# Patient Record
Sex: Male | Born: 1950 | Race: Black or African American | Hispanic: No | Marital: Married | Smoking: Former smoker
Health system: Southern US, Community
[De-identification: ages and names within clinical notes are randomized; demographics above are authoritative.]

## PROBLEM LIST (undated history)

## (undated) DIAGNOSIS — I10 Essential (primary) hypertension: Secondary | ICD-10-CM

## (undated) DIAGNOSIS — E119 Type 2 diabetes mellitus without complications: Secondary | ICD-10-CM

## (undated) DIAGNOSIS — E669 Obesity, unspecified: Secondary | ICD-10-CM

## (undated) DIAGNOSIS — N189 Chronic kidney disease, unspecified: Secondary | ICD-10-CM

## (undated) DIAGNOSIS — I2699 Other pulmonary embolism without acute cor pulmonale: Secondary | ICD-10-CM

## (undated) DIAGNOSIS — I428 Other cardiomyopathies: Secondary | ICD-10-CM

## (undated) DIAGNOSIS — M1990 Unspecified osteoarthritis, unspecified site: Secondary | ICD-10-CM

## (undated) DIAGNOSIS — E7800 Pure hypercholesterolemia, unspecified: Secondary | ICD-10-CM

## (undated) DIAGNOSIS — D126 Benign neoplasm of colon, unspecified: Secondary | ICD-10-CM

## (undated) DIAGNOSIS — H269 Unspecified cataract: Secondary | ICD-10-CM

## (undated) DIAGNOSIS — C801 Malignant (primary) neoplasm, unspecified: Secondary | ICD-10-CM

## (undated) DIAGNOSIS — T7840XA Allergy, unspecified, initial encounter: Secondary | ICD-10-CM

## (undated) DIAGNOSIS — I509 Heart failure, unspecified: Secondary | ICD-10-CM

## (undated) DIAGNOSIS — Z9889 Other specified postprocedural states: Secondary | ICD-10-CM

## (undated) DIAGNOSIS — F1020 Alcohol dependence, uncomplicated: Secondary | ICD-10-CM

## (undated) DIAGNOSIS — I119 Hypertensive heart disease without heart failure: Secondary | ICD-10-CM

## (undated) DIAGNOSIS — K219 Gastro-esophageal reflux disease without esophagitis: Secondary | ICD-10-CM

## (undated) DIAGNOSIS — D472 Monoclonal gammopathy: Secondary | ICD-10-CM

## (undated) HISTORY — DX: Unspecified cataract: H26.9

## (undated) HISTORY — DX: Malignant (primary) neoplasm, unspecified: C80.1

## (undated) HISTORY — PX: JOINT REPLACEMENT: SHX530

## (undated) HISTORY — PX: CIRCUMCISION: SUR203

## (undated) HISTORY — DX: Benign neoplasm of colon, unspecified: D12.6

## (undated) HISTORY — PX: EYE SURGERY: SHX253

## (undated) HISTORY — DX: Other specified postprocedural states: Z98.890

## (undated) HISTORY — DX: Essential (primary) hypertension: I10

## (undated) HISTORY — DX: Monoclonal gammopathy: D47.2

## (undated) HISTORY — DX: Pure hypercholesterolemia, unspecified: E78.00

## (undated) HISTORY — DX: Allergy, unspecified, initial encounter: T78.40XA

## (undated) HISTORY — DX: Gastro-esophageal reflux disease without esophagitis: K21.9

## (undated) HISTORY — PX: TUMOR EXCISION: SHX421

## (undated) HISTORY — DX: Unspecified osteoarthritis, unspecified site: M19.90

---
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
## 2004-01-14 ENCOUNTER — Ambulatory Visit (HOSPITAL_COMMUNITY): Admission: RE | Admit: 2004-01-14 | Discharge: 2004-01-14 | Payer: Self-pay | Admitting: Family Medicine

## 2004-05-01 DIAGNOSIS — D126 Benign neoplasm of colon, unspecified: Secondary | ICD-10-CM

## 2004-05-01 HISTORY — DX: Benign neoplasm of colon, unspecified: D12.6

## 2004-05-20 ENCOUNTER — Ambulatory Visit (HOSPITAL_COMMUNITY): Admission: RE | Admit: 2004-05-20 | Discharge: 2004-05-20 | Payer: Self-pay | Admitting: Internal Medicine

## 2004-05-20 ENCOUNTER — Ambulatory Visit: Payer: Self-pay | Admitting: Internal Medicine

## 2004-05-20 HISTORY — PX: COLONOSCOPY: SHX174

## 2007-06-28 ENCOUNTER — Ambulatory Visit: Payer: Self-pay | Admitting: Internal Medicine

## 2007-06-28 ENCOUNTER — Encounter: Payer: Self-pay | Admitting: Internal Medicine

## 2007-06-28 ENCOUNTER — Ambulatory Visit (HOSPITAL_COMMUNITY): Admission: RE | Admit: 2007-06-28 | Discharge: 2007-06-28 | Payer: Self-pay | Admitting: Internal Medicine

## 2007-06-28 HISTORY — PX: OTHER SURGICAL HISTORY: SHX169

## 2008-05-01 HISTORY — PX: TOTAL HIP ARTHROPLASTY: SHX124

## 2009-01-05 ENCOUNTER — Inpatient Hospital Stay (HOSPITAL_COMMUNITY): Admission: RE | Admit: 2009-01-05 | Discharge: 2009-01-08 | Payer: Self-pay | Admitting: Orthopedic Surgery

## 2009-03-30 ENCOUNTER — Inpatient Hospital Stay (HOSPITAL_COMMUNITY): Admission: RE | Admit: 2009-03-30 | Discharge: 2009-04-01 | Payer: Self-pay | Admitting: Orthopedic Surgery

## 2010-02-24 ENCOUNTER — Ambulatory Visit (HOSPITAL_COMMUNITY): Admission: RE | Admit: 2010-02-24 | Discharge: 2010-02-24 | Payer: Self-pay | Admitting: Family Medicine

## 2010-08-02 LAB — BASIC METABOLIC PANEL
CO2: 28 mEq/L (ref 19–32)
Calcium: 8.3 mg/dL — ABNORMAL LOW (ref 8.4–10.5)
Calcium: 8.4 mg/dL (ref 8.4–10.5)
Chloride: 97 mEq/L (ref 96–112)
Creatinine, Ser: 1.07 mg/dL (ref 0.4–1.5)
GFR calc Af Amer: >60 mL/min (ref >60)
GFR calc non Af Amer: >60 mL/min (ref >60)
Glucose, Bld: 135 mg/dL — ABNORMAL HIGH (ref 70–99)
Glucose, Bld: 153 mg/dL — ABNORMAL HIGH (ref 70–99)
Potassium: 3.8 mEq/L (ref 3.5–5.1)
Sodium: 132 mEq/L — ABNORMAL LOW (ref 135–145)
Sodium: 135 mEq/L (ref 135–145)

## 2010-08-02 LAB — CBC
HCT: 32.1 % — ABNORMAL LOW (ref 39.0–52.0)
Hemoglobin: 10.9 g/dL — ABNORMAL LOW (ref 13.0–17.0)
Hemoglobin: 10.9 g/dL — ABNORMAL LOW (ref 13.0–17.0)
MCHC: 34.1 g/dL (ref 30.0–36.0)
MCV: 91.3 fL (ref 78.0–100.0)
RBC: 3.52 MIL/uL — ABNORMAL LOW (ref 4.22–5.81)
RDW: 13.5 % (ref 11.5–15.5)
RDW: 13.9 % (ref 11.5–15.5)

## 2010-08-02 LAB — GLUCOSE, CAPILLARY
Glucose-Capillary: 151 mg/dL — ABNORMAL HIGH (ref 70–99)
Glucose-Capillary: 163 mg/dL — ABNORMAL HIGH (ref 70–99)

## 2010-08-03 LAB — URINALYSIS, ROUTINE W REFLEX MICROSCOPIC
Bilirubin Urine: NEGATIVE
Glucose, UA: NEGATIVE mg/dL
Hgb urine dipstick: NEGATIVE
Specific Gravity, Urine: 1.038 — ABNORMAL HIGH (ref 1.005–1.030)
Urobilinogen, UA: 0.2 mg/dL (ref 0.0–1.0)

## 2010-08-03 LAB — BASIC METABOLIC PANEL
BUN: 17 mg/dL (ref 6–23)
CO2: 26 mEq/L (ref 19–32)
Chloride: 107 mEq/L (ref 96–112)
Creatinine, Ser: 1.07 mg/dL (ref 0.4–1.5)
Potassium: 4.6 mEq/L (ref 3.5–5.1)

## 2010-08-03 LAB — DIFFERENTIAL
Basophils Relative: 1 % (ref 0–1)
Eosinophils Absolute: 0.1 10*3/uL (ref 0.0–0.7)
Eosinophils Relative: 1 % (ref 0–5)
Lymphs Abs: 2.4 10*3/uL (ref 0.7–4.0)
Monocytes Relative: 10 % (ref 3–12)
Neutrophils Relative %: 47 % (ref 43–77)

## 2010-08-03 LAB — CBC
HCT: 41.1 % (ref 39.0–52.0)
MCHC: 33.2 g/dL (ref 30.0–36.0)
MCV: 91.9 fL (ref 78.0–100.0)
Platelets: 316 10*3/uL (ref 150–400)

## 2010-08-03 LAB — TYPE AND SCREEN
ABO/RH(D): O POS
Antibody Screen: NEGATIVE

## 2010-08-03 LAB — GLUCOSE, CAPILLARY
Glucose-Capillary: 141 mg/dL — ABNORMAL HIGH (ref 70–99)
Glucose-Capillary: 143 mg/dL — ABNORMAL HIGH (ref 70–99)

## 2010-08-03 LAB — PROTIME-INR
INR: 0.93 (ref 0.00–1.49)
Prothrombin Time: 12.4 seconds (ref 11.6–15.2)

## 2010-08-05 LAB — URINALYSIS, ROUTINE W REFLEX MICROSCOPIC
Bilirubin Urine: NEGATIVE
Glucose, UA: NEGATIVE mg/dL
Hgb urine dipstick: NEGATIVE
Specific Gravity, Urine: 1.031 — ABNORMAL HIGH (ref 1.005–1.030)
Urobilinogen, UA: 0.2 mg/dL (ref 0.0–1.0)
pH: 5.5 (ref 5.0–8.0)

## 2010-08-05 LAB — CBC
HCT: 32.3 % — ABNORMAL LOW (ref 39.0–52.0)
HCT: 32.3 % — ABNORMAL LOW (ref 39.0–52.0)
HCT: 41 % (ref 39.0–52.0)
Hemoglobin: 10.9 g/dL — ABNORMAL LOW (ref 13.0–17.0)
Hemoglobin: 11 g/dL — ABNORMAL LOW (ref 13.0–17.0)
Hemoglobin: 14 g/dL (ref 13.0–17.0)
MCHC: 34.1 g/dL (ref 30.0–36.0)
MCHC: 34.1 g/dL (ref 30.0–36.0)
Platelets: 252 10*3/uL (ref 150–400)
RBC: 3.4 MIL/uL — ABNORMAL LOW (ref 4.22–5.81)
RDW: 13.7 % (ref 11.5–15.5)
RDW: 14 % (ref 11.5–15.5)
RDW: 14 % (ref 11.5–15.5)
WBC: 9.8 10*3/uL (ref 4.0–10.5)

## 2010-08-05 LAB — GLUCOSE, CAPILLARY
Glucose-Capillary: 101 mg/dL — ABNORMAL HIGH (ref 70–99)
Glucose-Capillary: 119 mg/dL — ABNORMAL HIGH (ref 70–99)
Glucose-Capillary: 119 mg/dL — ABNORMAL HIGH (ref 70–99)
Glucose-Capillary: 139 mg/dL — ABNORMAL HIGH (ref 70–99)
Glucose-Capillary: 139 mg/dL — ABNORMAL HIGH (ref 70–99)
Glucose-Capillary: 144 mg/dL — ABNORMAL HIGH (ref 70–99)
Glucose-Capillary: 147 mg/dL — ABNORMAL HIGH (ref 70–99)
Glucose-Capillary: 168 mg/dL — ABNORMAL HIGH (ref 70–99)
Glucose-Capillary: 96 mg/dL (ref 70–99)

## 2010-08-05 LAB — BASIC METABOLIC PANEL
BUN: 9 mg/dL (ref 6–23)
CO2: 25 mEq/L (ref 19–32)
CO2: 28 mEq/L (ref 19–32)
Calcium: 8.6 mg/dL (ref 8.4–10.5)
Chloride: 106 mEq/L (ref 96–112)
GFR calc Af Amer: >60 mL/min (ref >60)
GFR calc non Af Amer: >60 mL/min (ref >60)
GFR calc non Af Amer: >60 mL/min (ref >60)
Glucose, Bld: 140 mg/dL — ABNORMAL HIGH (ref 70–99)
Glucose, Bld: 144 mg/dL — ABNORMAL HIGH (ref 70–99)
Glucose, Bld: 146 mg/dL — ABNORMAL HIGH (ref 70–99)
Potassium: 3.5 mEq/L (ref 3.5–5.1)
Potassium: 3.9 mEq/L (ref 3.5–5.1)
Potassium: 4.2 mEq/L (ref 3.5–5.1)
Sodium: 135 mEq/L (ref 135–145)
Sodium: 138 mEq/L (ref 135–145)

## 2010-08-05 LAB — DIFFERENTIAL
Basophils Absolute: 0 10*3/uL (ref 0.0–0.1)
Basophils Relative: 1 % (ref 0–1)
Eosinophils Relative: 1 % (ref 0–5)
Monocytes Absolute: 0.5 10*3/uL (ref 0.1–1.0)
Monocytes Relative: 8 % (ref 3–12)

## 2010-08-05 LAB — ABO/RH: ABO/RH(D): O POS

## 2010-08-05 LAB — TYPE AND SCREEN: ABO/RH(D): O POS

## 2010-09-13 NOTE — H&P (Signed)
NAMEYUMA, PACELLA               ACCOUNT NO.:  [id number]   MEDICAL RECORD NO.:  [id number]          PATIENT TYPE:  INP   LOCATION:  NA                           FACILITY:  [facility]   PHYSICIAN:  Madlyn Frankel. Charlann Boxer, M.D.  DATE OF BIRTH:  [birth year]   DATE OF ADMISSION:  01/05/2009  DATE OF DISCHARGE:                              HISTORY & PHYSICAL   PROCEDURE:  Right total hip replacement.   CHIEF COMPLAINT:  Right hip pain.   HISTORY OF PRESENT ILLNESS:  A 60 year old male with a history of right  hip pain secondary to osteoarthritis.  It has been refractory to all  conservative treatment.   PRIMARY CARE PHYSICIAN:  Dr. Regino Schultze.   MEDICAL HISTORY:  1. Osteoarthritis.  2. Hypertension.  3. Diabetes.   PAST SURGICAL HISTORY:  Circumcision, 1978.   FAMILY HISTORY:  Diabetes.   SOCIAL HISTORY:  Married.  Supervisor.  Nonsmoker.   PRIMARY CAREGIVER:  Will be family in the home postoperatively.   DRUG ALLERGIES:  NO KNOWN DRUG ALLERGIES.   MEDICATIONS:  1. Lisinopril.  2. Metformin.  3. Hydrocodone.   Verify dosage and frequency with the patient at time of admission, check  home medication reconciliation sheet.   REVIEW OF SYSTEMS:  None other than HPI.   PHYSICAL EXAMINATION:  Pulse 72.  Respirations 16.  Blood pressure  146/90.  GENERAL:  Awake, alert, and oriented.  HEENT:  Normocephalic.  NECK:  Supple.  No carotid bruits.  CHEST:  Lung sounds clear to auscultation bilaterally.  BREASTS:  Deferred.  HEART:  S1 and S2, distinct.  ABDOMEN:  Soft, nontender.  Bowel sounds present.  PELVIS:  Stable.  GENITOURINARY:  Deferred.  EXTREMITIES:  Right hip has increased pain with weightbearing.  Trendelenburg gait.  SKIN:  No cellulitis.  NEUROLOGIC:  Intact distal sensibilities.   LABORATORY DATA:  Labs, EKG, and chest x-ray pending.   IMPRESSION:  Right hip osteoarthritis.   PLAN OF ACTION:  Right total hip replacement with Dr. Charlann Boxer at [facility] on January 05, 2009.  Questions were encouraged, answered, and  reviewed.  Postoperative medications were provided including aspirin for  DVT prophylaxis.     ______________________________  Yetta Glassman [address], [place]      Madlyn Frankel. Charlann Boxer, M.D.  Electronically Signed    BLM/MEDQ  D:  12/24/2008  T:  12/24/2008  Job:  [id number]   cc:   Kirk Ruths, M.D.  Fax: [phone]

## 2010-09-13 NOTE — Op Note (Signed)
NAME:  Corey Murphy, Corey Murphy               ACCOUNT NO.:  [id number]   MEDICAL RECORD NO.:  [id number]          PATIENT TYPE:  AMB   LOCATION:  DAY                           FACILITY:  [facility]   PHYSICIAN:  R. Roetta Sessions, M.D. DATE OF BIRTH:  [birth year]   DATE OF PROCEDURE:  06/28/2007  DATE OF DISCHARGE:                               OPERATIVE REPORT   PROCEDURE:  Ileocolonoscopy and biopsy.   INDICATIONS FOR PROCEDURE:  A 60 year old gentleman with a history of  colonic adenoma removed from his colon 3 years ago.  He is here for  surveillance.  He has no lower GI tract symptoms.  Colonoscopy is now  being done.  Risks, benefits, alternatives, limitations have been  reviewed, questions answered.  He is agreeable.  Please see  documentation in the medical record.   PROCEDURE NOTE:  O2 saturation, blood pressure, pulse and respirations  were monitored throughout the entire procedure.  Conscious sedation:  Versed 3 mg IV, Demerol 75 mg IV in divided doses.  Instrument:  Pentax  video chip system.   FINDINGS:  Digital rectal exam revealed no abnormalities.  Endoscopic  findings:  The prep was adequate.   Colon:  Colonic mucosa was surveyed from the rectosigmoid junction  through the left, transverse and right colon to appendiceal orifice,  ileocecal valve and cecum.  These structures were well-seen and  photographed for the record.  The terminal ileum was intubated as well.  From this level the scope was slowly withdrawn.  All previously-  mentioned mucosal surfaces were again seen.  The patient had a 2 x 4-mm  area of hyperplastic-appearing polypoid mucosa on a fold, mid ascending  colon, which was cold biopsied.  The remainder of the colonic mucosa  appeared normal aside from some submucosal petechiae in the area the  sigmoid.  There were no diverticula or other abnormalities observed.  The terminal ileum mucosa appeared normal.  The scope was pulled down  into the rectum, where a  thorough examination of the rectal mucosa  including retroflexion in the anal verge demonstrated no abnormalities.  The patient tolerated the procedure well, was reacted in endoscopy.   IMPRESSION:  1. Normal rectum.  2. Submucosal sigmoid diverticula (chronic), doubt clinical      significance.  3. Hyperplastic polypoid-appearing fold, mid ascending colon, status      post cold biopsy.  4. Remainder colonic mucosa and terminal ileum mucosa appeared normal.   RECOMMENDATIONS:  1. Follow up on pathology.  2. Further recommendations to follow.      Jonathon Bellows, M.D.  Electronically Signed     RMR/MEDQ  D:  06/28/2007  T:  06/29/2007  Job:  [id number]   cc:   Kirk Ruths, M.D.  Fax: [phone]

## 2010-09-16 NOTE — Consult Note (Signed)
NAME:  Corey Murphy, Corey Murphy               ACCOUNT NO.:  [id number]   MEDICAL RECORD NO.:  [id number]          PATIENT TYPE:  OUT   LOCATION:  RDC                           FACILITY:  [facility]   PHYSICIAN:  R. Roetta Sessions, M.D. DATE OF BIRTH:  03-Jun-1950   DATE OF CONSULTATION:  02/11/2004  DATE OF DISCHARGE:  01/14/2004                                   CONSULTATION   REASON FOR CONSULTATION:  Hematochezia.   HISTORY OF PRESENT ILLNESS:  [name] is a pleasant 60 year old  African-American male sent over at the courtesy of Dr. Regino Murphy to further  evaluate intermittent rectal bleeding. He denies constipation or diarrhea  but sees a small amount of blood with wiping and on the stool. He says this  is more often when he eats quite a bit of red meat, but [name] tells me  that Dr. Regino Murphy found him to be Hemoccult positive on recent physical  examination. [name] did have a sigmoidoscopy as he describes in [place],  [place], some 3 years ago for similar symptoms. He was told he had a  spastic colon. He has never had his entire lower GI tract evaluated. There  is no family history of colon cancer, although his sister died at age 75  with some type of unknown cancer. He has a history of gastroesophageal  reflux disease symptoms without any alarming features such as odynophagia,  dysphagia, early satiety, nausea, vomiting. He has had no melena. He was  recently started on Aciphex with excellent control of his symptoms.   PAST MEDICAL HISTORY:  1.  Significant for hypertension.  2.  Gastroesophageal reflux disease.   PAST SURGICAL HISTORY:  None.   CURRENT MEDICATIONS:  1.  Toprol once daily.  2.  Aspirin 81 mg daily.  3.  Aciphex once daily.   FAMILY HISTORY:  Father is alive in good health. Mother has diabetes. No  history of chronic GI or liver illness.   SOCIAL HISTORY:  The patient is married. He works for [employer]. He  formally worked for [employer]. He  is from [place]. He  lived in [place] for a while before relocating in [place]. He has 2 sons  that are grown. He occasionally uses alcohol, and he does smoke  occasionally.   REVIEW OF SYSTEMS:  No weight loss. No chest pain or dyspnea on exertion. No  fever or chills.   PHYSICAL EXAMINATION:  GENERAL:  A pleasant 60 year old gentleman resting  comfortably.  VITAL SIGNS:  Weight 218, height 5 foot 8. Temperature 97.9, blood pressure  160/100, pulse 80.  SKIN:  Warm and dry.  HEENT:  No scleral icterus. Conjunctivae are pink. JVD is not prominent.  Oral cavity with no lesions.  CHEST:  Lungs are clear to auscultation.  HEART:  Regular rate and rhythm without murmurs, gallops, or rubs.  ABDOMEN:  Nondistended. Positive bowel sounds, soft, nontender, without  appreciable mass or organomegaly.  EXTREMITIES:  No edema.  RECTAL:  Deferred until the time of colonoscopy.   IMPRESSION:  [name] is a pleasant 60 year old  gentleman with  intermittent, essentially paper hematochezia. He is 60 years of age. He has  never had his entire lower GI tract evaluated. It sounds like he had a  sigmoidoscopy some 3 years ago without significant findings.   RECOMMENDATIONS:  [name] needs a diagnostic colonoscopy. I discussed  this approach with the patient. Potential risks, benefits, and alternatives  have been reviewed, questions answered. He is agreeable. Plan to perform  this procedure in the very near future at [facility]. Will make  further recommendations once endoscopic evaluation has been completed.   I would to thank Dr. Regino Murphy for allowing me to see this very nice gentleman  today.     Corey Murphy   RMR/MEDQ  D:  02/11/2004  T:  02/11/2004  Job:  [id number]   cc:   Corey Murphy, M.D.  [address]  [place]  [place] [address]  Fax: [phone]

## 2010-09-16 NOTE — Op Note (Signed)
NAME:  Corey Murphy, Corey Murphy               ACCOUNT NO.:  [id number]   MEDICAL RECORD NO.:  [id number]          PATIENT TYPE:  AMB   LOCATION:  DAY                           FACILITY:  [facility]   PHYSICIAN:  R. Roetta Sessions, M.D. DATE OF BIRTH:  09-01-1950   DATE OF PROCEDURE:  05/20/2004  DATE OF DISCHARGE:                                 OPERATIVE REPORT   PROCEDURE:  Colonoscopy with biopsy.   INDICATIONS FOR PROCEDURE:  The patient is a 60 year old gentleman referred  by the courtesy of Dr. Yetta Numbers for further evaluation of intermittent  paper hematochezia.  He was found to be Hemoccult-positive.  He had a  sigmoidoscopy three years ago without significant findings.  No family  history of colorectal neoplasia.  He was seen in the office on March 13, 2004 and was set up for colonoscopy last year; however, because of  scheduling conflicts on the part of the patient, he put it off until now.  There has, otherwise, been no change in his status.  Please see my  handwritten updated H&P.   PROCEDURE:  O2 saturation, blood pressure, pulses, and respirations were  monitored throughout the entirety of the procedure.  Conscious sedation was  with Versed 5 mg IV, Demerol 75 mg IV in divided doses.  The instrument used  was the Olympus video chip system.   FINDINGS:  Digital rectal examination revealed no abnormalities.   ENDOSCOPIC FINDINGS:  The prep was good.   Rectum:  Examination of the rectal mucosa including retroflex view of the  anal verge revealed only some internal hemorrhoids.   Colon:  The colonic mucosa was surveyed from the rectosigmoid junction  through the left, transverse, right colon to the area of the appendiceal  orifice, ileocecal valve, and cecum.  These structures were well-seen and  photographed for the record.  From this level, the scope was slowly  withdrawn.  All previously mentioned mucosal surfaces were again seen.  The  patient had a 4-mm polyp in the  mid-right colon which was removed with cold  biopsy technique.  There was also a focal area of submucosal petechial  hemorrhages in the sigmoid colon involving about a 10 to 15-cm segment.  The  mucosa, otherwise, appeared normal.  This does not appear to be colitis  grossly.  I did elect to biopsy the mucosa in this area.  The remainder of  the colonic mucosa appeared normal.  The patient tolerated the procedure  well and was reactive in endoscopy.   IMPRESSION:  1.  Internal hemorrhoids.  Otherwise normal rectum.  2.  Focal areas of petechial submucosal hemorrhage in the sigmoid colon of      uncertain significance.  I did not see any diverticula on this area      biopsied.  The remainder of the colonic mucosa appeared normal, except      for a diminutive polyp in the mid-right colon which was cold      biopsy/removed.  3.  I suspect the patient has bled from hemorrhoids.   RECOMMENDATIONS:  1.  Increase fiber in  diet.  2.  Hemorrhoid literature provided to Mr. Duley.  3.  10-day course of Anusol suppositories, one per rectum at bedtime.  4.  Follow up on pathology.  5.  Further recommendations to follow.      RMR/MEDQ  D:  05/20/2004  T:  05/20/2004  Job:  [id number]   cc:   Kirk Ruths, M.D.  [address]  [place]  [place] [address]  Fax: [phone]

## 2012-04-29 ENCOUNTER — Encounter: Payer: Self-pay | Admitting: *Deleted

## 2012-05-27 ENCOUNTER — Encounter: Payer: Self-pay | Admitting: Internal Medicine

## 2012-05-28 ENCOUNTER — Ambulatory Visit (INDEPENDENT_AMBULATORY_CARE_PROVIDER_SITE_OTHER): Payer: BC Managed Care – PPO | Admitting: Urgent Care

## 2012-05-28 ENCOUNTER — Encounter: Payer: Self-pay | Admitting: Urgent Care

## 2012-05-28 ENCOUNTER — Other Ambulatory Visit: Payer: Self-pay | Admitting: Internal Medicine

## 2012-05-28 VITALS — BP 145/80 | HR 95 | Temp 97.4°F | Ht 67.0 in | Wt 227.6 lb

## 2012-05-28 DIAGNOSIS — Z860100 Personal history of colon polyps, unspecified: Secondary | ICD-10-CM

## 2012-05-28 MED ORDER — PEG 3350-KCL-NA BICARB-NACL 420 G PO SOLR: 4000.0000 mL | ORAL | Status: DC

## 2012-05-28 NOTE — Progress Notes (Signed)
Primary Care Physician:  Kirk Ruths, MD Primary Gastroenterologist:  Dr. Jena Gauss  Chief Complaint  Patient presents with  . Colonoscopy    HPI:  Corey Murphy is a 62 y.o. male here to set up surveillance colonoscopy.  Hx of adenomatous polyp on colonosocpy in 2006.  Last colonoscopy was 06/28/2007 by Dr Jena Gauss where he had a hyperplastic polyp removed & chronic submucosal sigmoid diverticula.   Denies any lower GI symptoms including constipation, diarrhea, rectal bleeding, melena or weight loss.  Rare heartburn a couple times per year with spicy or greasy foods.   Denies nausea, vomiting, dysphagia, odynophagia or anorexia.   Past Medical History  Diagnosis Date  . Diabetes mellitus   . Hypertension   . Hypercholesteremia   . Colonic adenoma 2006    Past Surgical History  Procedure Date  . Colonoscopy 05/20/2004    RMR: Internal hemorrhoids.  Diminutive adenomatous polyp in the mid-right colon, otherwise normal  . Ileocolonoscopy 06/28/2007    RUE:[id number] rectum/Submucosal sigmoid diverticula (chronic), doubt clinical significance Hyperplastic polypoid-appearing fold, mid ascending colon, status/Remainder colonic mucosa and terminal ileum mucosa appeared normal   . Total hip arthroplasty 2010    left  . Total hip arthroplasty 2010    right    Current Outpatient Prescriptions  Medication Sig Dispense Refill  . fluticasone (FLONASE) 50 MCG/ACT nasal spray Place 1 spray into the nose daily.       Marland Kitchen glimepiride (AMARYL) 4 MG tablet Take 4 mg by mouth daily before breakfast.      . linagliptin (TRADJENTA) 5 MG TABS tablet Take 5 mg by mouth daily.      Marland Kitchen lisinopril (PRINIVIL,ZESTRIL) 10 MG tablet Take 10 mg by mouth daily.      . metFORMIN (GLUCOPHAGE) 1000 MG tablet Take 1,000 mg by mouth 2 (two) times daily with a meal.      . naproxen (NAPROSYN) 500 MG tablet Take 500 mg by mouth 2 (two) times daily with a meal.       . simvastatin (ZOCOR) 20 MG tablet Take 20 mg by mouth every  evening.      Marland Kitchen VIAGRA 100 MG tablet Take 100 mg by mouth as needed.       . polyethylene glycol-electrolytes (TRILYTE) 420 G solution Take 4,000 mLs by mouth as directed.  4000 mL  0    Allergies as of 05/28/2012  . (No Known Allergies)    Family History:There is no known family history of colorectal carcinoma , liver disease, or inflammatory bowel disease.  Problem Relation Age of Onset  . Colon cancer Neg Hx     History   Social History  . Marital Status: Married    Spouse Name: N/A    Number of Children: 2  . Years of Education: N/A   Occupational History  . retired, Set designer    Social History Main Topics  . Smoking status: Former Smoker -- 1.0 packs/day for 12 years    Types: Cigarettes    Quit date: 05/28/2008  . Smokeless tobacco: Not on file  . Alcohol Use: Yes     Comment: 12-14 beers on weekends only  . Drug Use: No  . Sexually Active: Not on file   Other Topics Concern  . Not on file   Social History Narrative  . No narrative on file    Review of Systems: Gen: Denies any fever, chills, sweats, anorexia, fatigue, weakness, malaise, weight loss, and sleep disorder CV: Denies chest pain, angina, palpitations, syncope,  orthopnea, PND, peripheral edema, and claudication. Resp: Denies dyspnea at rest, dyspnea with exercise, cough, sputum, wheezing, coughing up blood, and pleurisy. GI: Denies vomiting blood, jaundice, and fecal incontinence.   Denies dysphagia or odynophagia. GU : Denies urinary burning, blood in urine, urinary frequency, urinary hesitancy, nocturnal urination, and urinary incontinence. MS: Denies joint pain, limitation of movement, and swelling, stiffness, low back pain, extremity pain. Denies muscle weakness, cramps, atrophy.  Derm: Denies rash, itching, dry skin, hives, moles, warts, or unhealing ulcers.  Psych: Denies depression, anxiety, memory loss, suicidal ideation, hallucinations, paranoia, and confusion. Heme: Denies bruising,  bleeding, and enlarged lymph nodes. Neuro:  Denies any headaches, dizziness, paresthesias. Endo:  Denies any problems with thyroid or adrenal function.  Physical Exam: BP 145/80  Pulse 95  Temp 97.4 F (36.3 C) (Oral)  Ht 5\' 7"  (1.702 m)  Wt 227 lb 9.6 oz (103.239 kg)  BMI 35.65 kg/m2 No LMP for male patient. General:   Alert,  Well-developed, well-nourished, pleasant and cooperative in NAD Head:  Normocephalic and atraumatic. Eyes:  Sclera clear, no icterus.   Conjunctiva pink. Ears:  Normal auditory acuity. Nose:  No deformity, discharge, or lesions. Mouth:  No deformity or lesions,oropharynx pink & moist. Neck:  Supple; no masses or thyromegaly. Lungs:  Clear throughout to auscultation.   No wheezes, crackles, or rhonchi. No acute distress. Heart:  Regular rate and rhythm; no murmurs, clicks, rubs,  or gallops. Abdomen:  Normal bowel sounds.  No bruits.  Soft, non-tender and non-distended without masses, hepatosplenomegaly or hernias noted.  No guarding or rebound tenderness.   Rectal:  Deferred.  Msk:  Symmetrical without gross deformities. Normal posture. Pulses:  Normal pulses noted. Extremities:  No clubbing or edema. Neurologic:  Alert and  oriented x4;  grossly normal neurologically. Skin:  Intact without significant lesions or rashes. Lymph Nodes:  No significant cervical adenopathy. Psych:  Alert and cooperative. Normal mood and affect.

## 2012-05-28 NOTE — Patient Instructions (Addendum)
Colonoscopy with Dr Jena Gauss Take half of your diabetes medications (TRADGENTA / METFORMIN / GLIMIPERIDE) the day prior to the procedure Hold diabetes medications day of procedure Bring all your medications and/or any insulin to the hospital the day of the procedure. Follow blood sugars, call us or your PCP if any problems.

## 2012-05-28 NOTE — Assessment & Plan Note (Addendum)
Corey Murphy is a pleasant 62 y.o. male with hx adenomatous colon polyp(2006).  Due for surveillance colonoscopy with Dr Jena Gauss.  I have discussed risks & benefits which include, but are not limited to, bleeding, infection, perforation & drug reaction.  The patient agrees with this plan & written consent will be obtained Half TRADGENTA / METFORMIN / GLIMIPERIDE the day prior to the procedure Hold diabetes medications day of procedure Bring all medications and/or any insulin to the hospital the day of the procedure. Follow blood sugars, call us or your PCP if any problems.

## 2012-05-29 ENCOUNTER — Encounter (HOSPITAL_COMMUNITY): Payer: Self-pay | Admitting: Pharmacy Technician

## 2012-05-29 NOTE — Progress Notes (Signed)
Faxed to PCP

## 2012-06-19 ENCOUNTER — Encounter (HOSPITAL_COMMUNITY): Payer: Self-pay

## 2012-06-19 ENCOUNTER — Encounter (HOSPITAL_COMMUNITY)
Admission: RE | Disposition: A | Discharge status: Home / Self Care | Payer: Self-pay | Source: Ambulatory Visit | Attending: Internal Medicine

## 2012-06-19 ENCOUNTER — Ambulatory Visit (HOSPITAL_COMMUNITY)
Admission: RE | Admit: 2012-06-19 | Discharge: 2012-06-19 | Disposition: A | Discharge status: Home / Self Care | Payer: BC Managed Care – PPO | Source: Ambulatory Visit | Attending: Internal Medicine | Admitting: Internal Medicine

## 2012-06-19 DIAGNOSIS — D126 Benign neoplasm of colon, unspecified: Secondary | ICD-10-CM

## 2012-06-19 DIAGNOSIS — I10 Essential (primary) hypertension: Secondary | ICD-10-CM | POA: Insufficient documentation

## 2012-06-19 DIAGNOSIS — K5730 Diverticulosis of large intestine without perforation or abscess without bleeding: Secondary | ICD-10-CM

## 2012-06-19 DIAGNOSIS — Z860100 Personal history of colon polyps, unspecified: Secondary | ICD-10-CM | POA: Insufficient documentation

## 2012-06-19 DIAGNOSIS — Z01812 Encounter for preprocedural laboratory examination: Secondary | ICD-10-CM | POA: Insufficient documentation

## 2012-06-19 DIAGNOSIS — Z1211 Encounter for screening for malignant neoplasm of colon: Secondary | ICD-10-CM

## 2012-06-19 DIAGNOSIS — E119 Type 2 diabetes mellitus without complications: Secondary | ICD-10-CM | POA: Insufficient documentation

## 2012-06-19 DIAGNOSIS — Z860101 Personal history of adenomatous and serrated colon polyps: Secondary | ICD-10-CM

## 2012-06-19 HISTORY — PX: COLONOSCOPY: SHX5424

## 2012-06-19 LAB — GLUCOSE, CAPILLARY: Glucose-Capillary: 145 mg/dL — ABNORMAL HIGH (ref 70–99)

## 2012-06-19 SURGERY — COLONOSCOPY
Anesthesia: Moderate Sedation

## 2012-06-19 MED ORDER — MIDAZOLAM HCL 5 MG/5ML IJ SOLN
INTRAMUSCULAR | Status: DC | PRN
  Administered 2012-06-19 (×2): 2 mg via INTRAVENOUS

## 2012-06-19 MED ORDER — STERILE WATER FOR IRRIGATION IR SOLN
Status: DC | PRN
  Administered 2012-06-19: 10:00:00

## 2012-06-19 MED ORDER — ONDANSETRON HCL 4 MG/2ML IJ SOLN
INTRAMUSCULAR | Status: AC
  Filled 2012-06-19: qty 2

## 2012-06-19 MED ORDER — MEPERIDINE HCL 100 MG/ML IJ SOLN
INTRAMUSCULAR | Status: AC
  Filled 2012-06-19: qty 2

## 2012-06-19 MED ORDER — SODIUM CHLORIDE 0.45 % IV SOLN
INTRAVENOUS | Status: DC
  Administered 2012-06-19: 10:00:00 via INTRAVENOUS

## 2012-06-19 MED ORDER — MIDAZOLAM HCL 5 MG/5ML IJ SOLN
INTRAMUSCULAR | Status: AC
  Filled 2012-06-19: qty 10

## 2012-06-19 MED ORDER — ONDANSETRON HCL 4 MG/2ML IJ SOLN
INTRAMUSCULAR | Status: DC | PRN
  Administered 2012-06-19: 4 mg via INTRAVENOUS

## 2012-06-19 MED ORDER — MEPERIDINE HCL 100 MG/ML IJ SOLN
INTRAMUSCULAR | Status: DC | PRN
  Administered 2012-06-19: 50 mg via INTRAVENOUS
  Administered 2012-06-19: 25 mg via INTRAVENOUS

## 2012-06-19 NOTE — Progress Notes (Signed)
Gold watch to locker with clothes

## 2012-06-19 NOTE — Op Note (Signed)
[facility] [address] [place], [address]   COLONOSCOPY PROCEDURE REPORT  PATIENT: Corey Murphy, Corey Murphy  MR#:         [id number] BIRTHDATE: October 30, 1950 , 61  yrs. old GENDER: Male ENDOSCOPIST: R.  Roetta Sessions, MD FACP FACG REFERRED BY:  Karleen Hampshire, M.D. PROCEDURE DATE:  06/19/2012 PROCEDURE:     Colonoscopy biopsy and snare polypectomy  INDICATIONS: History of colonic adenoma  INFORMED CONSENT:  The risks, benefits, alternatives and imponderables including but not limited to bleeding, perforation as well as the possibility of a missed lesion have been reviewed.  The potential for biopsy, lesion removal, etc. have also been discussed.  Questions have been answered.  All parties agreeable. Please see the history and physical in the medical record for more information.  MEDICATIONS: Versed 4 mg IV and Demerol 75 mg mg IV in divided doses.  Zofran 4 mg IV  DESCRIPTION OF PROCEDURE:  After a digital rectal exam was performed, the Pentax Colonoscope [id number]  colonoscope was advanced from the anus through the rectum and colon to the area of the cecum, ileocecal valve and appendiceal orifice.  The cecum was deeply intubated.  These structures were well-seen and photographed for the record.  From the level of the cecum and ileocecal valve, the scope was slowly and cautiously withdrawn.  The mucosal surfaces were carefully surveyed utilizing scope tip deflection to facilitate fold flattening as needed.  The scope was pulled down into the rectum where a thorough examination including retroflexion was performed.    FINDINGS:  Adequate preparation. Normal rectum. Few scattered sigmoid diverticula; (1) diminutive polyp at the ileocecal valve. Patient had (1)  7 mm flat polyp at the splenic flexure with (1) adjacent diminutive polyp.  The remainder of the colonic mucosa..  THERAPEUTIC / DIAGNOSTIC MANEUVERS PERFORMED:  The above-mentioned polyps were either cold  biopsy-removed or hot snare/removed.  COMPLICATIONS: None  CECAL WITHDRAWAL TIME:  11 minutes  IMPRESSION:  Colonic diverticulosis. Colonic polyps-removed as described above the  RECOMMENDATIONS: Followup on pathology.   _______________________________ eSigned:  R. Roetta Sessions, MD FACP [facility] 06/19/2012 10:53 AM   CC:

## 2012-06-19 NOTE — H&P (View-Only) (Signed)
Primary Care Physician:  MCGOUGH,WILLIAM M, MD Primary Gastroenterologist:  Dr. Rourk  Chief Complaint  Patient presents with  . Colonoscopy    HPI:  Corey Murphy is a 62 y.o. male here to set up surveillance colonoscopy.  Hx of adenomatous polyp on colonosocpy in 2006.  Last colonoscopy was 06/28/2007 by Dr Rourk where he had a hyperplastic polyp removed & chronic submucosal sigmoid diverticula.   Denies any lower GI symptoms including constipation, diarrhea, rectal bleeding, melena or weight loss.  Rare heartburn a couple times per year with spicy or greasy foods.   Denies nausea, vomiting, dysphagia, odynophagia or anorexia.   Past Medical History  Diagnosis Date  . Diabetes mellitus   . Hypertension   . Hypercholesteremia   . Colonic adenoma 2006    Past Surgical History  Procedure Date  . Colonoscopy 05/20/2004    RMR: Internal hemorrhoids.  Diminutive adenomatous polyp in the mid-right colon, otherwise normal  . Ileocolonoscopy 06/28/2007    RMR:Normal rectum/Submucosal sigmoid diverticula (chronic), doubt clinical significance Hyperplastic polypoid-appearing fold, mid ascending colon, status/Remainder colonic mucosa and terminal ileum mucosa appeared normal   . Total hip arthroplasty 2010    left  . Total hip arthroplasty 2010    right    Current Outpatient Prescriptions  Medication Sig Dispense Refill  . fluticasone (FLONASE) 50 MCG/ACT nasal spray Place 1 spray into the nose daily.       . glimepiride (AMARYL) 4 MG tablet Take 4 mg by mouth daily before breakfast.      . linagliptin (TRADJENTA) 5 MG TABS tablet Take 5 mg by mouth daily.      . lisinopril (PRINIVIL,ZESTRIL) 10 MG tablet Take 10 mg by mouth daily.      . metFORMIN (GLUCOPHAGE) 1000 MG tablet Take 1,000 mg by mouth 2 (two) times daily with a meal.      . naproxen (NAPROSYN) 500 MG tablet Take 500 mg by mouth 2 (two) times daily with a meal.       . simvastatin (ZOCOR) 20 MG tablet Take 20 mg by mouth every  evening.      . VIAGRA 100 MG tablet Take 100 mg by mouth as needed.       . polyethylene glycol-electrolytes (TRILYTE) 420 G solution Take 4,000 mLs by mouth as directed.  4000 mL  0    Allergies as of 05/28/2012  . (No Known Allergies)    Family History:There is no known family history of colorectal carcinoma , liver disease, or inflammatory bowel disease.  Problem Relation Age of Onset  . Colon cancer Neg Hx     History   Social History  . Marital Status: Married    Spouse Name: N/A    Number of Children: 2  . Years of Education: N/A   Occupational History  . retired, manufacturing    Social History Main Topics  . Smoking status: Former Smoker -- 1.0 packs/day for 12 years    Types: Cigarettes    Quit date: 05/28/2008  . Smokeless tobacco: Not on file  . Alcohol Use: Yes     Comment: 12-14 beers on weekends only  . Drug Use: No  . Sexually Active: Not on file   Other Topics Concern  . Not on file   Social History Narrative  . No narrative on file    Review of Systems: Gen: Denies any fever, chills, sweats, anorexia, fatigue, weakness, malaise, weight loss, and sleep disorder CV: Denies chest pain, angina, palpitations, syncope,   orthopnea, PND, peripheral edema, and claudication. Resp: Denies dyspnea at rest, dyspnea with exercise, cough, sputum, wheezing, coughing up blood, and pleurisy. GI: Denies vomiting blood, jaundice, and fecal incontinence.   Denies dysphagia or odynophagia. GU : Denies urinary burning, blood in urine, urinary frequency, urinary hesitancy, nocturnal urination, and urinary incontinence. MS: Denies joint pain, limitation of movement, and swelling, stiffness, low back pain, extremity pain. Denies muscle weakness, cramps, atrophy.  Derm: Denies rash, itching, dry skin, hives, moles, warts, or unhealing ulcers.  Psych: Denies depression, anxiety, memory loss, suicidal ideation, hallucinations, paranoia, and confusion. Heme: Denies bruising,  bleeding, and enlarged lymph nodes. Neuro:  Denies any headaches, dizziness, paresthesias. Endo:  Denies any problems with thyroid or adrenal function.  Physical Exam: BP 145/80  Pulse 95  Temp 97.4 F (36.3 C) (Oral)  Ht 5' 7" (1.702 m)  Wt 227 lb 9.6 oz (103.239 kg)  BMI 35.65 kg/m2 No LMP for male patient. General:   Alert,  Well-developed, well-nourished, pleasant and cooperative in NAD Head:  Normocephalic and atraumatic. Eyes:  Sclera clear, no icterus.   Conjunctiva pink. Ears:  Normal auditory acuity. Nose:  No deformity, discharge, or lesions. Mouth:  No deformity or lesions,oropharynx pink & moist. Neck:  Supple; no masses or thyromegaly. Lungs:  Clear throughout to auscultation.   No wheezes, crackles, or rhonchi. No acute distress. Heart:  Regular rate and rhythm; no murmurs, clicks, rubs,  or gallops. Abdomen:  Normal bowel sounds.  No bruits.  Soft, non-tender and non-distended without masses, hepatosplenomegaly or hernias noted.  No guarding or rebound tenderness.   Rectal:  Deferred.  Msk:  Symmetrical without gross deformities. Normal posture. Pulses:  Normal pulses noted. Extremities:  No clubbing or edema. Neurologic:  Alert and  oriented x4;  grossly normal neurologically. Skin:  Intact without significant lesions or rashes. Lymph Nodes:  No significant cervical adenopathy. Psych:  Alert and cooperative. Normal mood and affect.   

## 2012-06-19 NOTE — Interval H&P Note (Signed)
History and Physical Interval Note:  06/19/2012 10:14 AM  Corey Murphy  has presented today for surgery, with the diagnosis of HO POLYPS  The various methods of treatment have been discussed with the patient and family. After consideration of risks, benefits and other options for treatment, the patient has consented to  Procedure(s) with comments: COLONOSCOPY (N/A) - 8:30AM-rescheduled 10:30am Corey Murphy notified pt as a surgical intervention .  The patient's history has been reviewed, patient examined, no change in status, stable for surgery.  I have reviewed the patient's chart and labs.  Questions were answered to the patient's satisfaction.     Corey Murphy  Surveillance colonoscopy per plan.The risks, benefits, limitations, alternatives and imponderables have been reviewed with the patient. Questions have been answered. All parties are agreeable.

## 2012-06-21 ENCOUNTER — Encounter (HOSPITAL_COMMUNITY): Payer: Self-pay | Admitting: Internal Medicine

## 2012-06-22 ENCOUNTER — Encounter: Payer: Self-pay | Admitting: Internal Medicine

## 2012-06-25 ENCOUNTER — Encounter: Payer: Self-pay | Admitting: *Deleted

## 2013-03-17 ENCOUNTER — Emergency Department (HOSPITAL_COMMUNITY): Payer: BC Managed Care – PPO

## 2013-03-17 ENCOUNTER — Inpatient Hospital Stay (HOSPITAL_COMMUNITY)
Admission: EM | Admit: 2013-03-17 | Discharge: 2013-03-22 | DRG: 287 | Disposition: A | Discharge status: Home / Self Care | Payer: BC Managed Care – PPO | Attending: Cardiology | Admitting: Cardiology

## 2013-03-17 ENCOUNTER — Encounter (HOSPITAL_COMMUNITY): Payer: Self-pay | Admitting: Emergency Medicine

## 2013-03-17 DIAGNOSIS — Z79899 Other long term (current) drug therapy: Secondary | ICD-10-CM

## 2013-03-17 DIAGNOSIS — I5021 Acute systolic (congestive) heart failure: Secondary | ICD-10-CM

## 2013-03-17 DIAGNOSIS — I161 Hypertensive emergency: Secondary | ICD-10-CM

## 2013-03-17 DIAGNOSIS — E1165 Type 2 diabetes mellitus with hyperglycemia: Secondary | ICD-10-CM

## 2013-03-17 DIAGNOSIS — I160 Hypertensive urgency: Secondary | ICD-10-CM

## 2013-03-17 DIAGNOSIS — I110 Hypertensive heart disease with heart failure: Secondary | ICD-10-CM | POA: Diagnosis present

## 2013-03-17 DIAGNOSIS — J810 Acute pulmonary edema: Secondary | ICD-10-CM

## 2013-03-17 DIAGNOSIS — I428 Other cardiomyopathies: Secondary | ICD-10-CM | POA: Diagnosis present

## 2013-03-17 DIAGNOSIS — Z7982 Long term (current) use of aspirin: Secondary | ICD-10-CM

## 2013-03-17 DIAGNOSIS — I10 Essential (primary) hypertension: Secondary | ICD-10-CM | POA: Diagnosis present

## 2013-03-17 DIAGNOSIS — R000 Tachycardia, unspecified: Secondary | ICD-10-CM

## 2013-03-17 DIAGNOSIS — E669 Obesity, unspecified: Secondary | ICD-10-CM

## 2013-03-17 DIAGNOSIS — I509 Heart failure, unspecified: Secondary | ICD-10-CM | POA: Diagnosis present

## 2013-03-17 DIAGNOSIS — F411 Generalized anxiety disorder: Secondary | ICD-10-CM | POA: Diagnosis present

## 2013-03-17 DIAGNOSIS — I2789 Other specified pulmonary heart diseases: Secondary | ICD-10-CM | POA: Diagnosis present

## 2013-03-17 DIAGNOSIS — R0602 Shortness of breath: Secondary | ICD-10-CM

## 2013-03-17 DIAGNOSIS — I5043 Acute on chronic combined systolic (congestive) and diastolic (congestive) heart failure: Principal | ICD-10-CM | POA: Diagnosis present

## 2013-03-17 DIAGNOSIS — I119 Hypertensive heart disease without heart failure: Secondary | ICD-10-CM

## 2013-03-17 DIAGNOSIS — I5041 Acute combined systolic (congestive) and diastolic (congestive) heart failure: Secondary | ICD-10-CM

## 2013-03-17 DIAGNOSIS — F1020 Alcohol dependence, uncomplicated: Secondary | ICD-10-CM

## 2013-03-17 DIAGNOSIS — E119 Type 2 diabetes mellitus without complications: Secondary | ICD-10-CM

## 2013-03-17 DIAGNOSIS — Z87891 Personal history of nicotine dependence: Secondary | ICD-10-CM

## 2013-03-17 DIAGNOSIS — I498 Other specified cardiac arrhythmias: Secondary | ICD-10-CM | POA: Diagnosis not present

## 2013-03-17 DIAGNOSIS — E7800 Pure hypercholesterolemia, unspecified: Secondary | ICD-10-CM | POA: Diagnosis present

## 2013-03-17 DIAGNOSIS — E785 Hyperlipidemia, unspecified: Secondary | ICD-10-CM | POA: Diagnosis present

## 2013-03-17 DIAGNOSIS — Z96649 Presence of unspecified artificial hip joint: Secondary | ICD-10-CM

## 2013-03-17 HISTORY — DX: Other cardiomyopathies: I42.8

## 2013-03-17 HISTORY — DX: Essential (primary) hypertension: I10

## 2013-03-17 HISTORY — DX: Type 2 diabetes mellitus without complications: E11.9

## 2013-03-17 HISTORY — DX: Alcohol dependence, uncomplicated: F10.20

## 2013-03-17 HISTORY — DX: Obesity, unspecified: E66.9

## 2013-03-17 HISTORY — DX: Hypertensive heart disease without heart failure: I11.9

## 2013-03-17 LAB — CBC WITH DIFFERENTIAL/PLATELET
Eosinophils Relative: 1 % (ref 0–5)
HCT: 41.2 % (ref 39.0–52.0)
Hemoglobin: 14 g/dL (ref 13.0–17.0)
Lymphocytes Relative: 37 % (ref 12–46)
Lymphs Abs: 3 10*3/uL (ref 0.7–4.0)
MCV: 93 fL (ref 78.0–100.0)
Monocytes Relative: 9 % (ref 3–12)
Platelets: 343 10*3/uL (ref 150–400)
RBC: 4.43 MIL/uL (ref 4.22–5.81)
WBC: 8 10*3/uL (ref 4.0–10.5)

## 2013-03-17 LAB — BASIC METABOLIC PANEL
CO2: 23 mEq/L (ref 19–32)
Calcium: 9.9 mg/dL (ref 8.4–10.5)
Glucose, Bld: 225 mg/dL — ABNORMAL HIGH (ref 70–99)
Potassium: 3.5 mEq/L (ref 3.5–5.1)
Sodium: 137 mEq/L (ref 135–145)

## 2013-03-17 LAB — POCT I-STAT TROPONIN I: Troponin i, poc: 0.04 ng/mL (ref 0.00–0.08)

## 2013-03-17 MED ORDER — FUROSEMIDE 10 MG/ML IJ SOLN
40.0000 mg | Freq: Two times a day (BID) | INTRAMUSCULAR | Status: AC
  Administered 2013-03-18 (×3): 40 mg via INTRAVENOUS
  Filled 2013-03-17 (×3): qty 4

## 2013-03-17 MED ORDER — ASPIRIN 81 MG PO CHEW
81.0000 mg | CHEWABLE_TABLET | Freq: Every day | ORAL | Status: DC
  Administered 2013-03-18 – 2013-03-22 (×5): 81 mg via ORAL
  Filled 2013-03-17 (×5): qty 1

## 2013-03-17 MED ORDER — SODIUM CHLORIDE 0.9 % IV SOLN: 250.0000 mL | INTRAVENOUS | Status: DC | PRN

## 2013-03-17 MED ORDER — METHOCARBAMOL 500 MG PO TABS
500.0000 mg | ORAL_TABLET | Freq: Every day | ORAL | Status: DC | PRN
  Filled 2013-03-17: qty 1

## 2013-03-17 MED ORDER — SODIUM CHLORIDE 0.9 % IV SOLN
Freq: Once | INTRAVENOUS | Status: AC
  Administered 2013-03-17: 10 mL/h via INTRAVENOUS

## 2013-03-17 MED ORDER — ONDANSETRON HCL 4 MG/2ML IJ SOLN: 4.0000 mg | Freq: Four times a day (QID) | INTRAMUSCULAR | Status: DC | PRN

## 2013-03-17 MED ORDER — ENOXAPARIN SODIUM 40 MG/0.4ML ~~LOC~~ SOLN
40.0000 mg | SUBCUTANEOUS | Status: DC
  Administered 2013-03-17 – 2013-03-21 (×5): 40 mg via SUBCUTANEOUS
  Filled 2013-03-17 (×6): qty 0.4

## 2013-03-17 MED ORDER — ACETAMINOPHEN 325 MG PO TABS: 650.0000 mg | ORAL_TABLET | ORAL | Status: DC | PRN

## 2013-03-17 MED ORDER — NITROGLYCERIN IN D5W 200-5 MCG/ML-% IV SOLN
5.0000 ug/min | Freq: Once | INTRAVENOUS | Status: AC
  Administered 2013-03-17: 10 ug/min via INTRAVENOUS
  Filled 2013-03-17: qty 250

## 2013-03-17 MED ORDER — GLIMEPIRIDE 2 MG PO TABS
4.0000 mg | ORAL_TABLET | Freq: Every day | ORAL | Status: DC
  Administered 2013-03-18: 4 mg via ORAL
  Filled 2013-03-17: qty 2

## 2013-03-17 MED ORDER — SIMVASTATIN 20 MG PO TABS
20.0000 mg | ORAL_TABLET | Freq: Every evening | ORAL | Status: DC
  Administered 2013-03-18 – 2013-03-21 (×4): 20 mg via ORAL
  Filled 2013-03-17 (×6): qty 1

## 2013-03-17 MED ORDER — ENALAPRILAT 1.25 MG/ML IV SOLN
1.2500 mg | Freq: Once | INTRAVENOUS | Status: AC
  Administered 2013-03-17: 1.25 mg via INTRAVENOUS
  Filled 2013-03-17: qty 2

## 2013-03-17 MED ORDER — SODIUM CHLORIDE 0.9 % IJ SOLN
3.0000 mL | INTRAMUSCULAR | Status: DC | PRN
  Administered 2013-03-20: 3 mL via INTRAVENOUS

## 2013-03-17 MED ORDER — FUROSEMIDE 10 MG/ML IJ SOLN
80.0000 mg | Freq: Once | INTRAMUSCULAR | Status: AC
  Administered 2013-03-17: 80 mg via INTRAVENOUS
  Filled 2013-03-17: qty 8

## 2013-03-17 MED ORDER — HYDRALAZINE HCL 20 MG/ML IJ SOLN
20.0000 mg | INTRAMUSCULAR | Status: DC | PRN
  Administered 2013-03-18: 20 mg via INTRAVENOUS
  Filled 2013-03-17: qty 1

## 2013-03-17 MED ORDER — LISINOPRIL 20 MG PO TABS
20.0000 mg | ORAL_TABLET | Freq: Every day | ORAL | Status: DC
  Administered 2013-03-18 – 2013-03-22 (×5): 20 mg via ORAL
  Filled 2013-03-17: qty 1
  Filled 2013-03-17: qty 2
  Filled 2013-03-17: qty 1
  Filled 2013-03-17: qty 2
  Filled 2013-03-17: qty 1

## 2013-03-17 MED ORDER — LINAGLIPTIN 5 MG PO TABS
5.0000 mg | ORAL_TABLET | Freq: Every day | ORAL | Status: DC
  Filled 2013-03-17: qty 1

## 2013-03-17 MED ORDER — NITROGLYCERIN IN D5W 200-5 MCG/ML-% IV SOLN
5.0000 ug/min | Freq: Once | INTRAVENOUS | Status: AC
  Administered 2013-03-17: 25 ug/min via INTRAVENOUS

## 2013-03-17 MED ORDER — NITROGLYCERIN IN D5W 200-5 MCG/ML-% IV SOLN: 10.0000 ug/min | Freq: Once | INTRAVENOUS | Status: DC

## 2013-03-17 MED ORDER — NITROGLYCERIN 2 % TD OINT
1.0000 [in_us] | TOPICAL_OINTMENT | Freq: Four times a day (QID) | TRANSDERMAL | Status: DC
  Administered 2013-03-17: 1 [in_us] via TOPICAL
  Filled 2013-03-17: qty 1

## 2013-03-17 MED ORDER — SODIUM CHLORIDE 0.9 % IJ SOLN
3.0000 mL | Freq: Two times a day (BID) | INTRAMUSCULAR | Status: DC
  Administered 2013-03-18 – 2013-03-21 (×7): 3 mL via INTRAVENOUS

## 2013-03-17 NOTE — ED Notes (Signed)
Pt states sinus trouble x 4 weeks. appt with pmd for tomorrow. Productive cough, yellow  In color. Cough began 2 weeks ago. SOB began suddenly today at 1300.

## 2013-03-17 NOTE — ED Provider Notes (Addendum)
CSN: [id number]     Arrival date & time 03/17/13  1605 History   First MD Initiated Contact with Patient 03/17/13 1618     Chief Complaint  Patient presents with  . Shortness of Breath   HPI  Patient describes 6 weeks of slowly progressive symptoms. Exertional dyspnea. Nocturnal dyspnea. This all started to get a flu shot. He thought he had "a cold. He knows being "congested" he is not having chest pain. He has not had fever. He does not have any dependent edema. He is hypertensive. He is diabetic. He is non-insulin-dependent. He states he is compliant with his blood pressure regimen. No history of CHF for known coronary disease. Unproductive dry cough.  Past Medical History  Diagnosis Date  . Diabetes mellitus   . Hypertension   . Hypercholesteremia   . Colonic adenoma 2006   Past Surgical History  Procedure Laterality Date  . Colonoscopy  05/20/2004    RMR: Internal hemorrhoids.  Diminutive adenomatous polyp in the mid-right colon, otherwise normal  . Ileocolonoscopy  06/28/2007    WUJ:[id number] rectum/Submucosal sigmoid diverticula (chronic), doubt clinical significance Hyperplastic polypoid-appearing fold, mid ascending colon, status/Remainder colonic mucosa and terminal ileum mucosa appeared normal   . Total hip arthroplasty  2010    left  . Total hip arthroplasty  2010    right  . Colonoscopy N/A 06/19/2012    Procedure: COLONOSCOPY;  Surgeon: Corbin Ade, MD;  Location: [facility];  Service: Endoscopy;  Laterality: N/A;  8:30AM-rescheduled 10:30am Leigh Ann notified pt   Family History  Problem Relation Age of Onset  . Colon cancer Neg Hx    History  Substance Use Topics  . Smoking status: Former Smoker -- 1.00 packs/day for 12 years    Types: Cigarettes    Quit date: 05/28/2008  . Smokeless tobacco: Not on file  . Alcohol Use: Yes     Comment: 12-14 beers on weekends only    Review of Systems  Constitutional: Negative for fever, chills, diaphoresis, appetite  change and fatigue.  HENT: Negative for mouth sores, sore throat and trouble swallowing.   Eyes: Negative for visual disturbance.  Respiratory: Positive for cough and shortness of breath. Negative for chest tightness and wheezing.   Cardiovascular: Negative for chest pain.       Nocturia. Nocturnal dyspnea. No frank PND. He has had orthopnea for 48 hours.  Gastrointestinal: Negative for nausea, vomiting, abdominal pain, diarrhea and abdominal distention.  Endocrine: Negative for polydipsia, polyphagia and polyuria.  Genitourinary: Negative for dysuria, frequency and hematuria.  Musculoskeletal: Negative for gait problem.  Skin: Negative for color change, pallor and rash.  Neurological: Negative for dizziness, syncope, light-headedness and headaches.  Hematological: Does not bruise/bleed easily.  Psychiatric/Behavioral: Negative for behavioral problems and confusion.    Allergies  Review of patient's allergies indicates no known allergies.  Home Medications   Current Outpatient Rx  Name  Route  Sig  Dispense  Refill  . aspirin 81 MG tablet   Oral   Take 81 mg by mouth daily.         . fluticasone (FLONASE) 50 MCG/ACT nasal spray   Nasal   Place 1 spray into the nose daily.          Marland Kitchen glimepiride (AMARYL) 4 MG tablet   Oral   Take 4 mg by mouth daily before breakfast.         . linagliptin (TRADJENTA) 5 MG TABS tablet   Oral   Take  5 mg by mouth daily.         Marland Kitchen lisinopril (PRINIVIL,ZESTRIL) 20 MG tablet   Oral   Take 20 mg by mouth daily.         . metFORMIN (GLUCOPHAGE) 1000 MG tablet   Oral   Take 1,000 mg by mouth 2 (two) times daily with a meal.         . methocarbamol (ROBAXIN) 500 MG tablet   Oral   Take 500 mg by mouth daily as needed for muscle spasms.          . naproxen (NAPROSYN) 500 MG tablet   Oral   Take 500 mg by mouth 2 (two) times daily with a meal.          . simvastatin (ZOCOR) 20 MG tablet   Oral   Take 20 mg by mouth every  evening.          BP 152/103  Pulse 131  Resp 24  Ht 5\' 7"  (1.702 m)  Wt 225 lb (102.059 kg)  BMI 35.23 kg/m2  SpO2 95% Physical Exam  Constitutional: He is oriented to person, place, and time. He appears well-developed and well-nourished. No distress.  62-year-old black male. He appears his stated age. He is dyspneic with conversation.  HENT:  Head: Normocephalic.  Eyes: Conjunctivae are normal. Pupils are equal, round, and reactive to light. No scleral icterus.  Neck: Normal range of motion. Neck supple. JVD present. No thyromegaly present.  Cardiovascular: Normal rate and regular rhythm.  Exam reveals no friction rub.   No murmur heard. Pulmonary/Chest: Effort normal and breath sounds normal. No respiratory distress. He has no wheezes. He has no rales.  Crackles to the scapular tips bilaterally. Slightly diminished basilar breath sounds. Mild prolongation and bronchospasm.  Abdominal: Soft. Bowel sounds are normal. He exhibits no distension. There is no tenderness. There is no rebound.  Musculoskeletal: Normal range of motion.  Neurological: He is alert and oriented to person, place, and time.  Skin: Skin is warm and dry. No rash noted.  Trace of lower extremities edema. Symmetric. Bilateral.  Psychiatric: He has a normal mood and affect. His behavior is normal.    ED Course  CRITICAL CARE Performed by: Roney Marion Authorized by: Rolland Porter J Total critical care time: 80 minutes Critical care start time: 03/17/2013 4:40 PM Critical care end time: 03/17/2013 6:00 PM Critical care time was exclusive of separately billable procedures and treating other patients. Critical care was necessary to treat or prevent imminent or life-threatening deterioration of the following conditions: cardiac failure. Critical care was time spent personally by me on the following activities: discussions with consultants, evaluation of patient's response to treatment, examination of patient,  obtaining history from patient or surrogate, ordering and review of radiographic studies, ordering and review of laboratory studies, pulse oximetry and re-evaluation of patient's condition.   (including critical care time) Labs Review Labs Reviewed  BASIC METABOLIC PANEL - Abnormal; Notable for the following:    Glucose, Bld 225 (*)    GFR calc non Af Amer 71 (*)    GFR calc Af Amer 82 (*)    All other components within normal limits  PRO B NATRIURETIC PEPTIDE - Abnormal; Notable for the following:    Pro B Natriuretic peptide (BNP) 1726.0 (*)    All other components within normal limits  CBC WITH DIFFERENTIAL  POCT I-STAT TROPONIN I   Imaging Review Dg Chest Portable 1 View  03/17/2013  CLINICAL DATA:  Shortness of breath.  EXAM: PORTABLE CHEST - 1 VIEW  COMPARISON:  03/23/2009  FINDINGS: The cardiac silhouette is enlarged. There is prominence of the interstitial markings and indistinctness of the pulmonary vasculature. Peribronchial cuffing is identified. Fluid versus thickening is identified along the minor fissure on the right. Areas of increased density project within the perihilar regions. There are findings consistent with cephalization. The visualized bony skeleton is unremarkable.  IMPRESSION: 1. Interstitial infiltrate likely reflecting pulmonary edema an infectious or inflammatory infiltrate cannot be excluded clinically appropriate.   Electronically Signed   By: Salome Holmes M.D.   On: 03/17/2013 16:42    EKG Interpretation    Date/Time:  Monday March 17 2013 16:08:57 EST Ventricular Rate:  133 PR Interval:  192 QRS Duration: 78 QT Interval:  338 QTC Calculation: 503 R Axis:   65 Text Interpretation:  Sinus tachycardia Possible Left atrial enlargement Anterior infarct , age undetermined Abnormal ECG When compared with ECG of 30-Dec-2008 07:41, Vent. rate has increased BY  56 BPM Non-specific change in ST segment in Lateral leads            MDM   1.  Congestive heart failure   2. Hypertensive emergency     Clinical isn't in congestive heart failure. His hypertensive. No ischemic or ischemic changes on EKG. He was started on nitroglycerin drip. Gave him 80 of IV Lasix. He is diuresed over 500 cc. The blood pressures improved to 147 systolic. Heart rate is improved to 110. No troponin elevation. Normal renal function. I placed a call to the hospitalist regarding admission.    Roney Marion, MD 03/17/13 1932  Roney Marion, MD 03/17/13 2039  Roney Marion, MD 03/17/13 [phone]

## 2013-03-17 NOTE — ED Notes (Signed)
Emptied urinal, pt voided 800 cc clear urine

## 2013-03-17 NOTE — ED Notes (Signed)
Remove Nitro pasted and restart nitro drip per MD,

## 2013-03-17 NOTE — ED Notes (Signed)
Per MD stop Nitro drip and apply 1 inch nitro paste, recheck vitals in 10 min.

## 2013-03-17 NOTE — H&P (Signed)
PCP:   Kirk Ruths, MD   Chief Complaint:  sob  HPI: 62 yo male h/o dm, htn, hld comes in with several days of worsening sob particularly with exertion.  Denies cough or fevers.  No le edema or swelling.  No cp.  Sleeping ok at night, no orthopnea or pnd.  No prior h/o cad or chf.  No prior cath or stress testing.  Overall very active, wife thinks he has gained weight over last several weeks, but pt does not endorse this.  Found to be in new onset chf in ED.  Given dose of lasix with approx 2.5 liters diuresed already and pt feels much better.    Review of Systems:  Positive and negative as per HPI otherwise all other systems are negative  Past Medical History: Past Medical History  Diagnosis Date  . Diabetes mellitus   . Hypertension   . Hypercholesteremia   . Colonic adenoma 2006   Past Surgical History  Procedure Laterality Date  . Colonoscopy  05/20/2004    RMR: Internal hemorrhoids.  Diminutive adenomatous polyp in the mid-right colon, otherwise normal  . Ileocolonoscopy  06/28/2007    [facility]:QIONGE rectum/Submucosal sigmoid diverticula (chronic), doubt clinical significance Hyperplastic polypoid-appearing fold, mid ascending colon, status/Remainder colonic mucosa and terminal ileum mucosa appeared normal   . Total hip arthroplasty  2010    left  . Total hip arthroplasty  2010    right  . Colonoscopy N/A 06/19/2012    Procedure: COLONOSCOPY;  Surgeon: Corbin Ade, MD;  Location: [facility];  Service: Endoscopy;  Laterality: N/A;  8:30AM-rescheduled 10:30am [name] notified pt    Medications: Prior to Admission medications   Medication Sig Start Date End Date Taking? Authorizing Provider  aspirin 81 MG tablet Take 81 mg by mouth daily.   Yes Historical Provider, MD  fluticasone (FLONASE) 50 MCG/ACT nasal spray Place 1 spray into the nose daily.  05/22/12  Yes Historical Provider, MD  glimepiride (AMARYL) 4 MG tablet Take 4 mg by mouth daily before breakfast.    Yes Historical Provider, MD  linagliptin (TRADJENTA) 5 MG TABS tablet Take 5 mg by mouth daily.   Yes Historical Provider, MD  lisinopril (PRINIVIL,ZESTRIL) 20 MG tablet Take 20 mg by mouth daily.   Yes Historical Provider, MD  metFORMIN (GLUCOPHAGE) 1000 MG tablet Take 1,000 mg by mouth 2 (two) times daily with a meal.   Yes Historical Provider, MD  methocarbamol (ROBAXIN) 500 MG tablet Take 500 mg by mouth daily as needed for muscle spasms.  02/14/13  Yes Historical Provider, MD  naproxen (NAPROSYN) 500 MG tablet Take 500 mg by mouth 2 (two) times daily with a meal.  04/22/12  Yes Historical Provider, MD  simvastatin (ZOCOR) 20 MG tablet Take 20 mg by mouth every evening.   Yes Historical Provider, MD    Allergies:  No Known Allergies  Social History:  reports that he quit smoking about 4 years ago. His smoking use included Cigarettes. He has a 12 pack-year smoking history. He does not have any smokeless tobacco history on file. He reports that he drinks alcohol. He reports that he does not use illicit drugs.  Family History: Family History  Problem Relation Age of Onset  . Colon cancer Neg Hx     Physical Exam: Filed Vitals:   03/17/13 1800 03/17/13 1815 03/17/13 1845 03/17/13 1900  BP: 145/107 162/104 149/100 152/103  Pulse:      Resp: 34 35 29 24  Height:  Weight:      SpO2:       General appearance: alert, cooperative and no distress Head: Normocephalic, without obvious abnormality, atraumatic Eyes: negative Nose: Nares normal. Septum midline. Mucosa normal. No drainage or sinus tenderness. Neck: no JVD and supple, symmetrical, trachea midline Lungs: clear to auscultation bilaterally Heart: regular rate and rhythm, S1, S2 normal, no murmur, click, rub or gallop Abdomen: soft, non-tender; bowel sounds normal; no masses,  no organomegaly Extremities: extremities normal, atraumatic, no cyanosis or edema Pulses: 2+ and symmetric Skin: Skin color, texture, turgor normal.  No rashes or lesions Neurologic: Grossly normal  Labs on Admission:   Recent Labs  03/17/13 1620  NA 137  K 3.5  CL 98  CO2 23  GLUCOSE 225*  BUN 12  CREATININE 1.10  CALCIUM 9.9    Recent Labs  03/17/13 1620  WBC 8.0  NEUTROABS 4.3  HGB 14.0  HCT 41.2  MCV 93.0  PLT 343   Radiological Exams on Admission: Dg Chest Portable 1 View  03/17/2013   CLINICAL DATA:  Shortness of breath.  EXAM: PORTABLE CHEST - 1 VIEW  COMPARISON:  03/23/2009  FINDINGS: The cardiac silhouette is enlarged. There is prominence of the interstitial markings and indistinctness of the pulmonary vasculature. Peribronchial cuffing is identified. Fluid versus thickening is identified along the minor fissure on the right. Areas of increased density project within the perihilar regions. There are findings consistent with cephalization. The visualized bony skeleton is unremarkable.  IMPRESSION: 1. Interstitial infiltrate likely reflecting pulmonary edema an infectious or inflammatory infiltrate cannot be excluded clinically appropriate.   Electronically Signed   By: Salome Holmes M.D.   On: 03/17/2013 16:42    Assessment/Plan  62 yo male with uncontrolled htn with new onset chf probable diastolic dysfunction  Principal Problem:   Acute exacerbation of CHF new onset (congestive heart failure)- already much clinical improvement with iv lasix.  Will cont ntg gtt in stepdown overnight, serial enzymes.  Cont lasix iv.  chf pathway, echo in am.  Cardiology consult obtained to assess the need for eventual stress testing vs cath to r/o underlying CAD.  Hydralazine prn ordered with parameters.  Will need chf education prior to d/c.  Active Problems:   Hypercholesteremia   Hypertension   Diabetes mellitus   Hypertensive urgency   SOB (shortness of breath)   Sinus tachycardia    Leilanny Fluitt A 03/17/2013, 7:40 PM

## 2013-03-17 NOTE — ED Notes (Signed)
emptied urinlal output , clear urine.

## 2013-03-18 ENCOUNTER — Encounter (HOSPITAL_COMMUNITY): Payer: Self-pay | Admitting: Cardiology

## 2013-03-18 DIAGNOSIS — J810 Acute pulmonary edema: Secondary | ICD-10-CM

## 2013-03-18 DIAGNOSIS — I059 Rheumatic mitral valve disease, unspecified: Secondary | ICD-10-CM

## 2013-03-18 LAB — HEMOGLOBIN A1C
Hgb A1c MFr Bld: 6.6 % — ABNORMAL HIGH (ref <5.7)
Mean Plasma Glucose: 143 mg/dL — ABNORMAL HIGH (ref <117)

## 2013-03-18 LAB — GLUCOSE, CAPILLARY
Glucose-Capillary: 124 mg/dL — ABNORMAL HIGH (ref 70–99)
Glucose-Capillary: 113 mg/dL — ABNORMAL HIGH (ref 70–99)
Glucose-Capillary: 59 mg/dL — ABNORMAL LOW (ref 70–99)
Glucose-Capillary: 103 mg/dL — ABNORMAL HIGH (ref 70–99)

## 2013-03-18 LAB — BASIC METABOLIC PANEL
BUN: 10 mg/dL (ref 6–23)
CO2: 27 mEq/L (ref 19–32)
GFR calc non Af Amer: 67 mL/min — ABNORMAL LOW (ref >90)
Glucose, Bld: 128 mg/dL — ABNORMAL HIGH (ref 70–99)
Potassium: 3.4 mEq/L — ABNORMAL LOW (ref 3.5–5.1)

## 2013-03-18 LAB — TSH: TSH: 1.426 u[IU]/mL (ref 0.350–4.500)

## 2013-03-18 LAB — TROPONIN I
Troponin I: <0.3 ng/mL (ref <0.30)
Troponin I: <0.3 ng/mL (ref <0.30)

## 2013-03-18 MED ORDER — METOPROLOL TARTRATE 25 MG PO TABS
25.0000 mg | ORAL_TABLET | Freq: Two times a day (BID) | ORAL | Status: DC
  Administered 2013-03-18 – 2013-03-19 (×3): 25 mg via ORAL
  Filled 2013-03-18 (×3): qty 1

## 2013-03-18 MED ORDER — VITAMIN B-1 100 MG PO TABS
100.0000 mg | ORAL_TABLET | Freq: Every day | ORAL | Status: DC
  Administered 2013-03-18 – 2013-03-22 (×5): 100 mg via ORAL
  Filled 2013-03-18 (×5): qty 1

## 2013-03-18 MED ORDER — ADULT MULTIVITAMIN W/MINERALS CH
1.0000 | ORAL_TABLET | Freq: Every day | ORAL | Status: DC
  Administered 2013-03-18 – 2013-03-22 (×5): 1 via ORAL
  Filled 2013-03-18 (×5): qty 1

## 2013-03-18 MED ORDER — LORAZEPAM 1 MG PO TABS
1.0000 mg | ORAL_TABLET | Freq: Four times a day (QID) | ORAL | Status: AC | PRN
  Administered 2013-03-19: 1 mg via ORAL
  Filled 2013-03-18: qty 2

## 2013-03-18 MED ORDER — POTASSIUM CHLORIDE CRYS ER 20 MEQ PO TBCR
20.0000 meq | EXTENDED_RELEASE_TABLET | Freq: Every day | ORAL | Status: DC
  Administered 2013-03-18 – 2013-03-22 (×5): 20 meq via ORAL
  Filled 2013-03-18 (×5): qty 1

## 2013-03-18 MED ORDER — INSULIN ASPART 100 UNIT/ML ~~LOC~~ SOLN
0.0000 [IU] | Freq: Three times a day (TID) | SUBCUTANEOUS | Status: DC
  Administered 2013-03-18 – 2013-03-20 (×5): 1 [IU] via SUBCUTANEOUS
  Administered 2013-03-21: 2 [IU] via SUBCUTANEOUS
  Administered 2013-03-21: 1 [IU] via SUBCUTANEOUS
  Administered 2013-03-21: 2 [IU] via SUBCUTANEOUS
  Administered 2013-03-22: 1 [IU] via SUBCUTANEOUS
  Administered 2013-03-22: 2 [IU] via SUBCUTANEOUS

## 2013-03-18 MED ORDER — FOLIC ACID 1 MG PO TABS
1.0000 mg | ORAL_TABLET | Freq: Every day | ORAL | Status: DC
  Administered 2013-03-18 – 2013-03-22 (×5): 1 mg via ORAL
  Filled 2013-03-18 (×5): qty 1

## 2013-03-18 MED ORDER — THIAMINE HCL 100 MG/ML IJ SOLN
100.0000 mg | Freq: Every day | INTRAMUSCULAR | Status: DC
  Filled 2013-03-18 (×3): qty 1

## 2013-03-18 MED ORDER — LORAZEPAM 2 MG/ML IJ SOLN: 1.0000 mg | Freq: Four times a day (QID) | INTRAMUSCULAR | Status: AC | PRN

## 2013-03-18 NOTE — Progress Notes (Signed)
Cbg@2216 =59 hypoglycemia protocol initiated & OJ given. Cbg@2232 =63 OJ repeated with graham cracker with peanut butter given cbg @ 2248 =103

## 2013-03-18 NOTE — Plan of Care (Signed)
Problem: Consults Goal: Heart Failure Patient Education (See Patient Education module for education specifics.) Outcome: Progressing Ongoing education begun.  Dr. Onalee Hua touched base on present diagnosis and tests to be done.  Wife at bedside.  Patient admitted to drinking daily.  Discussed need to monitor fluid intake.  Recommendations made Goal: Nutrition Consult-if indicated Outcome: Progressing Patient is a diabetic but wife admitted that he does not eat diabetic.  Problem: Phase I Progression Outcomes Goal: Pain controlled with appropriate interventions Outcome: Not Applicable Date Met:  03/18/13 No complaints of pain Goal: Initial discharge plan identified Outcome: Progressing Home with spouse Goal: Hemodynamically stable Outcome: Progressing Hypertension on admission,  IV diuresing and prn hydralazine.  Nitroglycerin gtt titrated to off.

## 2013-03-18 NOTE — Progress Notes (Signed)
UR chart review completed.  

## 2013-03-18 NOTE — Consult Note (Signed)
CARDIOLOGY CONSULT NOTE   Patient ID: Corey Murphy MRN: [id number] DOB/AGE: [birth year] 62 y.o.  Admit Date: 03/17/2013 Referring Physician: PTH Primary Physician: Kirk Ruths, MD Consulting Cardiologist: Nona Dell MD Reason for Consultation: Pulmonary edema and hypertensive emergency  Clinical Summary Corey Murphy is a 62 y.o.male with history of type 2 diabetes, hypertension, and hypercholesterolemia admitted with worsening shortness of breath. He states symptoms began approximately 6 weeks ago after having had a flu shot. He states 2 days after the flu shot he began to  have cold-like symptoms. He treated this with over-the-counter medications. The patient states he was sick for approximately 2 weeks with coughing and congestion, and shortness of breath. He began to feel better for a few days, the symptoms returned with worsening symptoms over the last 2 weeks. Over the last 2 days, symptoms became severe with dyspnea on exertion, fatigue, and dizziness. He states he lives in a two-story home, and was unable to climb the stairs without significant shortness of breath and weakness. He also noted that he was having some swelling in his abdomen. Other symptoms including PND and orthopnea. He would get up and night and have to sit in a chair. As a result of these worsening symptoms he was seen in the ER.  In ER he was found to be hypertensive with a blood pressure of 196/122, heart rate of 132, O2 sat 91% on room air. Abnormal labs included elevated glucose at 225, low normal potassium at 3.5. Sodium 137. Pro BNP 1726. Chest x-ray revealed interstitial infiltrate likely reflecting pulmonary edema - an infectious or inflammatory infiltrate could not be excluded. The patient was treated with Lasix 80 mg IV, and nitroglycerin drip, and transition to nitroglycerin paste 1 inch. It had been admitted to the ICU for ongoing management. He is diuresis 2 L, is feeling and breathing better.  Echocardiogram is ordered.      No Known Allergies   Medications Scheduled Medications: . aspirin  81 mg Oral Daily  . enoxaparin (LOVENOX) injection  40 mg Subcutaneous Q24H  . folic acid  1 mg Oral Daily  . furosemide  40 mg Intravenous BID  . insulin aspart  0-9 Units Subcutaneous TID WC  . lisinopril  20 mg Oral Daily  . metoprolol tartrate  25 mg Oral BID  . multivitamin with minerals  1 tablet Oral Daily  . potassium chloride  20 mEq Oral Daily  . simvastatin  20 mg Oral QPM  . sodium chloride  3 mL Intravenous Q12H  . thiamine  100 mg Oral Daily   Or  . thiamine  100 mg Intravenous Daily    PRN Medications: sodium chloride, acetaminophen, hydrALAZINE, LORazepam, LORazepam, methocarbamol, ondansetron (ZOFRAN) IV, sodium chloride   Past Medical History  Diagnosis Date  . Type 2 diabetes mellitus   . Essential hypertension, benign   . Hypercholesteremia   . Colonic adenoma 2006    Past Surgical History  Procedure Laterality Date  . Colonoscopy  05/20/2004    RMR: Internal hemorrhoids.  Diminutive adenomatous polyp in the mid-right colon, otherwise normal  . Ileocolonoscopy  06/28/2007    JXB:JYNWGN rectum/Submucosal sigmoid diverticula (chronic), doubt clinical significance Hyperplastic polypoid-appearing fold, mid ascending colon, status/Remainder colonic mucosa and terminal ileum mucosa appeared normal   . Total hip arthroplasty  2010    Left  . Total hip arthroplasty  2010    Right  . Colonoscopy N/A 06/19/2012    Procedure: COLONOSCOPY;  Surgeon: Corbin Ade,  MD;  Location: [facility];  Service: Endoscopy;  Laterality: N/A;  8:30AM-rescheduled 10:30am Leigh Ann notified pt    Family History  Problem Relation Age of Onset  . Colon cancer Neg Hx   . Heart failure Mother   . Diabetes Mother   . Hypertension Mother   . Heart failure Sister   . Hypertension Sister   . Cancer Sister     Social History Corey Murphy reports that he quit smoking about 4  years ago. His smoking use included Cigarettes. He has a 12 pack-year smoking history. He does not have any smokeless tobacco history on file. Corey Murphy reports that he drinks alcohol.  Review of Systems Otherwise reviewed and negative except as outlined.  Physical Examination Blood pressure 150/107, pulse 111, temperature 98.4 F (36.9 C), temperature source Oral, resp. rate 22, height 5\' 7"  (1.702 m), weight 220 lb 7.4 oz (100 kg), SpO2 98.00%.  Intake/Output Summary (Last 24 hours) at 03/18/13 1100 Last data filed at 03/18/13 1024  Gross per 24 hour  Intake 645.93 ml  Output   2750 ml  Net -2104.07 ml    Telemetry: Sinus tachycardia   Appears comfortable at rest. HEENT: Conjunctiva and lids normal, oropharynx clear with moist mucosa. Neck: Supple, elevated JVP. No carotid bruits, no thyromegaly. Lungs: Clear to auscultation, nonlabored breathing at rest. Cardiac: Regular rate and rhythm, probable S3 or summation gallop. No significant murmur. Abdomen: Nontender, mildly protuberant, bowel sounds present, no guarding or rebound. Extremities: No pitting edema, distal pulses 2+. Skin: Warm and dry. Musculoskeletal: No kyphosis. Neuropsychiatric: Alert and oriented x3, affect grossly appropriate.   Lab Results  Basic Metabolic Panel:  Recent Labs Lab 03/17/13 1620 03/18/13 0423  NA 137 141  K 3.5 3.4*  CL 98 100  CO2 23 27  GLUCOSE 225* 128*  BUN 12 10  CREATININE 1.10 1.16  CALCIUM 9.9 9.7    CBC:  Recent Labs Lab 03/17/13 1620  WBC 8.0  NEUTROABS 4.3  HGB 14.0  HCT 41.2  MCV 93.0  PLT 343    Cardiac Enzymes:  Recent Labs Lab 03/17/13 2235 03/18/13 0423  TROPONINI <0.30 <0.30    BNP: 1,726   Radiology: Dg Chest Portable 1 View  03/17/2013   CLINICAL DATA:  Shortness of breath.  EXAM: PORTABLE CHEST - 1 VIEW  COMPARISON:  03/23/2009  FINDINGS: The cardiac silhouette is enlarged. There is prominence of the interstitial markings and  indistinctness of the pulmonary vasculature. Peribronchial cuffing is identified. Fluid versus thickening is identified along the minor fissure on the right. Areas of increased density project within the perihilar regions. There are findings consistent with cephalization. The visualized bony skeleton is unremarkable.  IMPRESSION: 1. Interstitial infiltrate likely reflecting pulmonary edema an infectious or inflammatory infiltrate cannot be excluded clinically appropriate.   Electronically Signed   By: Salome Holmes M.D.   On: 03/17/2013 16:42    ECG: Tracing from presentation showed sinus tachycardia, left atrial enlargement, poor R-wave progression cannot rule out old anterior infarct pattern..   Impression and Recommendations:  1. Acute Pulmonary Edema; Worsening symptoms over the last 3 days prior to admission, with associated PND, orthopnea, dyspnea on exertion, and abdominal distention. Cardiac markers argue against ACS. The patient is diuresing well on furosemide 40 mg twice a day. Echocardiogram ordered to assess systolic and diastolic function and help guide next step.  2. Regular alcohol intake: Patient drinks excessively on the weekends, usually a 12 pack of beer.  3. History of hypertension: Patient states he has been medically compliant. He is on lisinopril 20 mg daily at home. More remotely was on Toprol-XL as an outpatient. Now placed on hydralazine 20 mg every 4 hours when necessary, and also has a nitroglycerin paste 1 inch. He remains tachycardic. We will start low-dose metoprolol 25 mg twice a day. Monitor blood pressure response.   4. Diabetes mellitus, type 2: Hyperglycemic on admission. Recommend hemoglobin A1c. Patient continues on Amaryl daily and linagliptin.   5. Hypercholesterolemia: The patient is currently on simvastatin 20 mg daily. Fasting lipids and LFTs will be ordered.   Signed: Bettey Mare. Lyman Bishop NP [facility] 03/18/2013, 11:00 AM Co-Sign  MD  Attending note:  Patient seen and examined. Reviewed records and modified above note by Ms. Lawrence NP. Corey Murphy presents with a progressive history of dyspnea on exertion over the last 4-6 weeks. He states he had influenza vaccine initially, felt "sick" a few days thereafter. He does not recall any chest tightness or heaviness, no palpitations. Symptoms have worsened to include progressive fatigue, NYHA class III dyspnea, orthopnea, feeling of abdominal fullness. He presented with hypertensive emergency in the ER with blood pressure outlined above, possible pulmonary edema by chest x-ray. Now with combination of blood pressure control and diuresis he feels much better. Cardiac markers argue against ACS. ECG from presentation shows sinus tachycardia with left atrial enlargement and poor R-wave progression anteriorly, cannot rule out prior anterior infarct pattern. Plan at this time is to obtain an echocardiogram for assessment of systolic and diastolic function, then can make further recommendations regarding medical therapy and additional testing.  Jonelle Sidle, M.D., F.A.C.C.

## 2013-03-18 NOTE — Progress Notes (Signed)
*  PRELIMINARY RESULTS* Echocardiogram 2D Echocardiogram has been performed.  Shadie Sweatman 03/18/2013, 4:45 PM

## 2013-03-18 NOTE — Progress Notes (Signed)
Explained current ordered fluid restriction of 1500 cc and reasoning behind order. Patient understanding. Also reiterated daily weights, restricting fluid at home, and when to call the doctor for a daily/weekly weight gain.

## 2013-03-18 NOTE — Care Management Note (Addendum)
    Page 1 of 1   03/19/2013     10:39:31 AM   CARE MANAGEMENT NOTE 03/19/2013  Patient:  Corey Murphy, Corey Murphy   Account Number:  [id number]  Date Initiated:  03/18/2013  Documentation initiated by:  Sharrie Rothman  Subjective/Objective Assessment:   Pt admitted from home with CHF. Pt lives with his wife and will return home. Pt is independent with ADL's. Pt does have a cane for prn use.     Action/Plan:   CM explained improtance of daily wts, dietary compliance, and medication compliance. Nutritional consult placed. Will continue to monitor for discharge planning needs.   Anticipated DC Date:  03/21/2013   Anticipated DC Plan:  HOME/SELF CARE      DC Planning Services  CM consult      Choice offered to / List presented to:             Status of service:  Completed, signed off Medicare Important Message given?   (If response is "NO", the following Medicare IM given date fields will be blank) Date Medicare IM given:   Date Additional Medicare IM given:    Discharge Disposition:  ACUTE TO ACUTE TRANS  Per UR Regulation:    If discussed at Long Length of Stay Meetings, dates discussed:    Comments:  03/19/13 1040 Arlyss Queen, RN BSN CM Pt transfer to [facility] for cath.  03/18/13 1445 Arlyss Queen, RN BSN CM

## 2013-03-18 NOTE — Progress Notes (Signed)
Heart failure discussed with Corey Murphy and wife Corey Murphy.  Zone tool and Heart Failure handout given and discussed.  MD at bedside discussing new onset diagnosis.  His Mom died with heart failure, diabetes and HTN.  Patient does drink and will need to monitor that intake as well.   Video 102 to be watched today.

## 2013-03-18 NOTE — H&P (Signed)
[facility] PROGRESS NOTE  Corey Murphy WUJ:[id number] DOB: 11/22/1950 DOA: 03/17/2013 PCP: Kirk Ruths, MD  Summary: 62 year old man with history of diabetes and hypertension presented with 6 week history of increasing shortness of breath and dyspnea on exertion as well as weight gain. Initial evaluation in the emergency department suggested hypertensive emergency and new onset CHF. History with nitroglycerin fluid infusion and Lasix with improvement in the emergency department.  Assessment/Plan: 1. Hypertensive emergency: Clinically resolved at this point. Off nitroglycerin infusion. Precipitating factor unclear, possibly heart failure. No evidence of ACS at this point. Defer management to cardiology. Continue aspirin, Lasix, lisinopril, metoprolol. 2. Acute congestive heart failure: Etiology and type unclear. Followup 2-D echocardiogram. Defer management to cardiology. 3. Diabetes mellitus: start sliding scale insulin. Hold oral medications. 4. Alcohol use: CIWA, monitor for w/d. No signs of withdrawal at this point. 5. Protuberant abdomen: Soft, nontender. Bowel regimen. Follow clinically.   CIWA  Pending studies:   Hemoglobin A1c pending  Code Status: full code DVT prophylaxis: Lovenox Family Communication: Discussed with wife and son at bedside Disposition Plan: Home when improved  Brendia Sacks, MD  [facility]  Pager [phone] If 7PM-7AM, please contact night-coverage at [email or web address], password [facility] 03/18/2013, 8:54 AM  LOS: 1 day   Consultants:  Cardiology  Procedures:    Antibiotics:    HPI/Subjective: Feels much better. Breathing better. No abdominal pain, nausea or vomiting.  Objective: Filed Vitals:   03/18/13 0630 03/18/13 0647 03/18/13 0715 03/18/13 0800  BP: 153/124 153/124  127/64  Pulse: 110     Temp:   98.4 F (36.9 C)   TempSrc:   Oral   Resp: 18   25  Height:      Weight:      SpO2: 99%       Intake/Output Summary  (Last 24 hours) at 03/18/13 0854 Last data filed at 03/18/13 0715  Gross per 24 hour  Intake  45.93 ml  Output   2250 ml  Net -2204.07 ml     Filed Weights   03/17/13 1616 03/17/13 2210 03/18/13 0500  Weight: 102.059 kg (225 lb) 99.1 kg (218 lb 7.6 oz) 100 kg (220 lb 7.4 oz)    Exam:   Afebrile, vital signs improved. Blood pressure somewhat labile. Mild sinus tachycardia.  Telemetry: Sinus tachycardia  General: Appears calm and comfortable.  Psychiatric: Grossly normal mood and affect. Speech fluent and appropriate.  Cardiovascular: Regular rhythm, tachycardic, no murmur, rub or gallop. No lower extremity edema.  Respiratory: Clear to auscultation bilaterally. No wheezes, rales or rhonchi. Normal respiratory effort.  Abdomen: Soft, protuberant, positive bowel sounds, nontender.  Skin: Appears grossly unremarkable  Eyes, ENT appear unremarkable  Data Reviewed:  Potassium 3.4, basic metabolic panel unremarkable otherwise  Troponins negative thus far  Chest x-ray on admission with pulmonary edema  Scheduled Meds: . aspirin  81 mg Oral Daily  . enoxaparin (LOVENOX) injection  40 mg Subcutaneous Q24H  . furosemide  40 mg Intravenous BID  . glimepiride  4 mg Oral QAC breakfast  . linagliptin  5 mg Oral Daily  . lisinopril  20 mg Oral Daily  . nitroGLYCERIN  10 mcg/min Intravenous Once  . simvastatin  20 mg Oral QPM  . sodium chloride  3 mL Intravenous Q12H   Continuous Infusions:   Principal Problem:   Acute exacerbation of CHF (congestive heart failure) Active Problems:   Hypercholesteremia   Hypertension   Diabetes mellitus   Hypertensive urgency   SOB (shortness of breath)  Sinus tachycardia   Time spent 25 minutes

## 2013-03-19 ENCOUNTER — Encounter (HOSPITAL_COMMUNITY)
Admission: EM | Disposition: A | Discharge status: Home / Self Care | Payer: Self-pay | Source: Home / Self Care | Attending: Cardiology

## 2013-03-19 DIAGNOSIS — I509 Heart failure, unspecified: Secondary | ICD-10-CM

## 2013-03-19 DIAGNOSIS — I5021 Acute systolic (congestive) heart failure: Secondary | ICD-10-CM

## 2013-03-19 HISTORY — PX: LEFT AND RIGHT HEART CATHETERIZATION WITH CORONARY ANGIOGRAM: SHX5449

## 2013-03-19 LAB — POCT I-STAT 3, ART BLOOD GAS (G3+)
O2 Saturation: 97 %
pH, Arterial: 7.409 (ref 7.350–7.450)
pO2, Arterial: 95 mmHg (ref 80.0–100.0)

## 2013-03-19 LAB — BASIC METABOLIC PANEL
BUN: 17 mg/dL (ref 6–23)
Calcium: 10.1 mg/dL (ref 8.4–10.5)
GFR calc non Af Amer: 66 mL/min — ABNORMAL LOW (ref >90)
Glucose, Bld: 130 mg/dL — ABNORMAL HIGH (ref 70–99)
Potassium: 3.6 mEq/L (ref 3.5–5.1)
Sodium: 142 mEq/L (ref 135–145)

## 2013-03-19 LAB — POCT I-STAT 3, VENOUS BLOOD GAS (G3P V)
Acid-Base Excess: 1 mmol/L (ref 0.0–2.0)
Acid-Base Excess: 2 mmol/L (ref 0.0–2.0)
Bicarbonate: 27.5 meq/L — ABNORMAL HIGH (ref 20.0–24.0)
Bicarbonate: 28 meq/L — ABNORMAL HIGH (ref 20.0–24.0)
O2 Saturation: 55 %
O2 Saturation: 60 %
TCO2: 29 mmol/L (ref 0–100)
TCO2: 29 mmol/L (ref 0–100)
pCO2, Ven: 49 mmHg (ref 45.0–50.0)
pCO2, Ven: 49.2 mmHg (ref 45.0–50.0)
pH, Ven: 7.357 — ABNORMAL HIGH (ref 7.250–7.300)
pH, Ven: 7.362 — ABNORMAL HIGH (ref 7.250–7.300)
pO2, Ven: 31 mmHg (ref 30.0–45.0)
pO2, Ven: 33 mmHg (ref 30.0–45.0)

## 2013-03-19 LAB — GLUCOSE, CAPILLARY
Glucose-Capillary: 127 mg/dL — ABNORMAL HIGH (ref 70–99)
Glucose-Capillary: 128 mg/dL — ABNORMAL HIGH (ref 70–99)
Glucose-Capillary: 169 mg/dL — ABNORMAL HIGH (ref 70–99)

## 2013-03-19 LAB — LIPID PANEL
HDL: 49 mg/dL (ref >39)
LDL Cholesterol: 73 mg/dL (ref 0–99)
Total CHOL/HDL Ratio: 2.9 RATIO
Triglycerides: 110 mg/dL (ref <150)

## 2013-03-19 SURGERY — LEFT AND RIGHT HEART CATHETERIZATION WITH CORONARY ANGIOGRAM

## 2013-03-19 MED ORDER — CARVEDILOL 3.125 MG PO TABS: 3.1250 mg | ORAL_TABLET | Freq: Two times a day (BID) | ORAL | Status: DC

## 2013-03-19 MED ORDER — SODIUM CHLORIDE 0.9 % IJ SOLN: 3.0000 mL | Freq: Two times a day (BID) | INTRAMUSCULAR | Status: DC

## 2013-03-19 MED ORDER — ASPIRIN 81 MG PO CHEW: 81.0000 mg | CHEWABLE_TABLET | ORAL | Status: DC

## 2013-03-19 MED ORDER — FUROSEMIDE 40 MG PO TABS
40.0000 mg | ORAL_TABLET | Freq: Two times a day (BID) | ORAL | Status: DC
  Administered 2013-03-19: 40 mg via ORAL
  Filled 2013-03-19 (×5): qty 1

## 2013-03-19 MED ORDER — SPIRONOLACTONE 25 MG PO TABS
25.0000 mg | ORAL_TABLET | Freq: Every day | ORAL | Status: DC
  Administered 2013-03-19 – 2013-03-22 (×4): 25 mg via ORAL
  Filled 2013-03-19 (×5): qty 1

## 2013-03-19 MED ORDER — FENTANYL CITRATE 0.05 MG/ML IJ SOLN
INTRAMUSCULAR | Status: AC
  Filled 2013-03-19: qty 2

## 2013-03-19 MED ORDER — NITROGLYCERIN 0.2 MG/ML ON CALL CATH LAB
INTRAVENOUS | Status: AC
  Filled 2013-03-19: qty 1

## 2013-03-19 MED ORDER — SODIUM CHLORIDE 0.9 % IJ SOLN: 3.0000 mL | INTRAMUSCULAR | Status: DC | PRN

## 2013-03-19 MED ORDER — LIVING BETTER WITH HEART FAILURE BOOK
Freq: Once | Status: AC
  Administered 2013-03-19: 11:00:00
  Filled 2013-03-19: qty 1

## 2013-03-19 MED ORDER — CARVEDILOL 3.125 MG PO TABS
3.1250 mg | ORAL_TABLET | Freq: Two times a day (BID) | ORAL | Status: DC
  Administered 2013-03-19 – 2013-03-21 (×4): 3.125 mg via ORAL
  Filled 2013-03-19 (×6): qty 1

## 2013-03-19 MED ORDER — DIAZEPAM 5 MG PO TABS
5.0000 mg | ORAL_TABLET | ORAL | Status: AC
  Administered 2013-03-19: 5 mg via ORAL
  Filled 2013-03-19: qty 1

## 2013-03-19 MED ORDER — HEPARIN (PORCINE) IN NACL 2-0.9 UNIT/ML-% IJ SOLN
INTRAMUSCULAR | Status: AC
  Filled 2013-03-19: qty 1000

## 2013-03-19 MED ORDER — SODIUM CHLORIDE 0.9 % IV SOLN
INTRAVENOUS | Status: AC
  Administered 2013-03-19: 17:00:00 via INTRAVENOUS

## 2013-03-19 MED ORDER — MAGNESIUM HYDROXIDE 400 MG/5ML PO SUSP
30.0000 mL | Freq: Every evening | ORAL | Status: DC | PRN
  Administered 2013-03-19: 30 mL via ORAL
  Filled 2013-03-19: qty 30

## 2013-03-19 MED ORDER — LIDOCAINE HCL (PF) 1 % IJ SOLN
INTRAMUSCULAR | Status: AC
  Filled 2013-03-19: qty 30

## 2013-03-19 MED ORDER — SODIUM CHLORIDE 0.9 % IV SOLN: 250.0000 mL | INTRAVENOUS | Status: DC | PRN

## 2013-03-19 NOTE — Progress Notes (Signed)
Clarified with Dr. Diona Browner that patient could still take all morning meds.

## 2013-03-19 NOTE — H&P (View-Only) (Signed)
  Consulting cardiologist: Dr. Syon Tews G. Aaidyn San  Subjective:   Eating breakfast, breathing comfortably. No chest pain or palpitations.   Objective:   Temp:  [98 F (36.7 C)-98.5 F (36.9 C)] 98.4 F (36.9 C) (11/18 2000) Pulse Rate:  [46-117] 100 (11/19 0800) Resp:  [12-33] 19 (11/19 0800) BP: (108-155)/(68-133) 123/90 mmHg (11/19 0800) SpO2:  [93 %-100 %] 97 % (11/19 0800) Weight:  [216 lb 4.3 oz (98.1 kg)] 216 lb 4.3 oz (98.1 kg) (11/19 0500) Last BM Date: 03/17/13  Filed Weights   03/17/13 2210 03/18/13 0500 03/19/13 0500  Weight: 218 lb 7.6 oz (99.1 kg) 220 lb 7.4 oz (100 kg) 216 lb 4.3 oz (98.1 kg)    Intake/Output Summary (Last 24 hours) at 03/19/13 0847 Last data filed at 03/19/13 0200  Gross per 24 hour  Intake    560 ml  Output   1725 ml  Net  -1165 ml    Telemetry: Sinus rhythm to sinus tachycardia.  Exam:  General: Appears comfortable.  Lungs: Few rhonchi, nonlabored, no wheezing.  Cardiac: RRR, S3 noted.  Abdomen: N/ABS.  Extremities: No pitting.  Lab Results:  Basic Metabolic Panel:  Recent Labs Lab 03/17/13 1620 03/18/13 0423 03/19/13 0428  NA 137 141 142  K 3.5 3.4* 3.6  CL 98 100 103  CO2 23 27 26  GLUCOSE 225* 128* 130*  BUN 12 10 17  CREATININE 1.10 1.16 1.17  CALCIUM 9.9 9.7 10.1    CBC:  Recent Labs Lab 03/17/13 1620  WBC 8.0  HGB 14.0  HCT 41.2  MCV 93.0  PLT 343    Cardiac Enzymes:  Recent Labs Lab 03/17/13 2235 03/18/13 0423 03/18/13 1049  TROPONINI <0.30 <0.30 <0.30    BNP:  Recent Labs  03/17/13 1620  PROBNP 1726.0*    Echocardiogram: Study Conclusions  - Left ventricle: The cavity size was normal. Wall thickness was increased in a pattern of moderate LVH. Systolic function was severely reduced. The estimated ejection fraction was in the range of 20% to 25%. Diffuse hypokinesis. There is severe hypokinesis of the basal-midinferior myocardium. There is severe hypokinesis of the  distalanterior myocardium. There is severe hypokinesis of the mid-distalinferolateral myocardium. Doppler parameters are consistent with abnormal left ventricular relaxation (grade 1 diastolic dysfunction). - Mitral valve: Calcified annulus. Mild regurgitation. - Left atrium: The atrium was moderately dilated. - Right ventricle: Systolic function was moderately reduced. - Right atrium: Central venous pressure: 3mm Hg (est). - Atrial septum: No defect or patent foramen ovale was identified. - Tricuspid valve: Trivial regurgitation. - Pulmonary arteries: PA peak pressure: 31mm Hg (S). - Pericardium, extracardiac: There was no pericardial effusion. Impressions:  - No prior study for comparison. Moderate LVH with LVEF approximately 20-25%, diffuse hypokinesis with regional variation as noted, grade 1 diastolic dysfunction. Moderate left atrial enlargement. MAC with mild mitral regurgitation. Moderately reduced RV contraction. Trivial tricusdpid regurgitation with PASP 31 mmHg. No pericardial effusion.   Medications:   Scheduled Medications: . aspirin  81 mg Oral Daily  . enoxaparin (LOVENOX) injection  40 mg Subcutaneous Q24H  . folic acid  1 mg Oral Daily  . insulin aspart  0-9 Units Subcutaneous TID WC  . lisinopril  20 mg Oral Daily  . metoprolol tartrate  25 mg Oral BID  . multivitamin with minerals  1 tablet Oral Daily  . potassium chloride  20 mEq Oral Daily  . simvastatin  20 mg Oral QPM  . sodium chloride  3 mL Intravenous Q12H  .   thiamine  100 mg Oral Daily   Or  . thiamine  100 mg Intravenous Daily      PRN Medications:  sodium chloride, acetaminophen, hydrALAZINE, LORazepam, LORazepam, methocarbamol, ondansetron (ZOFRAN) IV, sodium chloride   Assessment:   1. Acute systolic heart failure, presentation with pulmonary edema and hypertensive urgency. Echocardiogram shows LVEF 20-25%, diffuse hypokinesis with some regional variation and grade 1 diastolic  dysfunction. Clinically improving with diuresis, blood pressure much better. Cardiac markers argue against ACS.  2. Regular alcohol intake on the weekends, reportedly at least a 12 pack of beer. Potential contributor to cardiomyopathy although not a certainty.  3. Known history of hypertension, on lisinopril as an outpatient.  4. Type 2 diabetes mellitus.  5. Hyperlipidemia, LDL 73 on Pravachol.   Plan/Discussion:    Discussed with patient and family member in room. Have recommended transfer to [place] [facility] in anticipation of a left and right heart catheterization for further evaluation of newly diagnosed cardiomyopathy and acute systolic heart failure. Risks and benefits were discussed, and he is in agreement.   Deloss Amico G. Darlette Dubow, M.D., F.A.C.C.  

## 2013-03-19 NOTE — Progress Notes (Signed)
Nutrition Education Note  RD consulted for nutrition education regarding Acute systolic CHF.  RD provided "Low Sodium Nutrition Therapy" diet education which included folder "Living Better with Heart Failure". Reviewed patient's dietary recall. Provided examples on ways to decrease sodium intake in diet. Discouraged intake of processed foods and use of salt shaker. Encouraged fresh fruits and vegetables as well as whole grain sources of carbohydrates to maximize fiber intake.   RD discussed why it is important for patient to adhere to diet recommendations, and emphasized the role of fluids, foods to avoid, and importance of weighing self daily. Teach back method used.  Expect good compliance.  Body mass index is 33.86 kg/(m^2). Pt meets criteria for obesity class I based on current BMI.  Current diet order is NPO and he is being transferred to [facility] for cardiac catherization today. Labs and medications reviewed. No further nutrition interventions warranted at this time. RD contact information provided. If additional nutrition issues arise, please re-consult RD.   Royann Shivers MS,RD,CSG,LDN Office: [phone] Pager: [phone]

## 2013-03-19 NOTE — Progress Notes (Signed)
Patient left with [facility] in stable condition. Report given to RN on truck. Report also called to Rodney,RT in cath lab.

## 2013-03-19 NOTE — Progress Notes (Signed)
Called into patients room at ~1137 with patient complaints of numbness in R hand and R side of his face. Slurred speech noted. PEERLA. Symptoms faded before assessment could be complete. BP noted to be elevated 170/119 HR 128. EKG ST. Dr. Kerry Hough notified and came to check on patient. At 1155 BP decreased to 136/99 HR 120. Patient stated "somewhat" when asked if he was anxious. MD stated to give 1mg  Ativan PO before leaving with [facility].

## 2013-03-19 NOTE — Interval H&P Note (Signed)
Cath Lab Visit (complete for each Cath Lab visit)  Clinical Evaluation Leading to the Procedure:   ACS: no  Non-ACS:    Anginal Classification: CCS III  Anti-ischemic medical therapy: Minimal Therapy (1 class of medications)  Non-Invasive Test Results: No non-invasive testing performed  Prior CABG: No previous CABG      History and Physical Interval Note:  03/19/2013 4:18 PM  Corey Murphy  has presented today for surgery, with the diagnosis of cp  The various methods of treatment have been discussed with the patient and family. After consideration of risks, benefits and other options for treatment, the patient has consented to  Procedure(s): LEFT HEART CATHETERIZATION WITH CORONARY ANGIOGRAM (N/A) as a surgical intervention .  The patient's history has been reviewed, patient examined, no change in status, stable for surgery.  I have reviewed the patient's chart and labs.  Questions were answered to the patient's satisfaction.     Lorine Bears

## 2013-03-19 NOTE — Progress Notes (Signed)
Was called to see patient for transient right hand and face numbness and slurring of speech.  On my arrival, patient is sitting up in bed.  He reports that his symptoms have resolved and lasted approximately 10 seconds.  His speech is clear.  He has some residual numbness in his right finger tips.  On examination, blood pressure was noted to be elevated with systolic >170 and diastolic >110.  Heart rate elevated in 120s.  He has no facial asymmetry.  Pupils are equal, round, reactive to light.  Speech is clear.  Strength is 5/5 in upper and lower extremities bilaterally, no pronator drift.  Peripheral pulses are intact in arms. CN 2-12 are grossly intact. Appears very anxious.   His symptoms may have been related to his transient spike in blood pressure, also anxiety may be playing a role here as well.  I think this needs to be further monitored and that patient is stable for transfer to [facility].  If the patient has recurrent/persistent symptoms, his cardiac catheterization may need to be delayed in order to pursue MRI of brain. He is already on aspirin.  No further acute interventions needed at this time.  Re-evaluate on arrival at [facility]. Discussed with Dr. Diona Browner.  Raksha Wolfgang

## 2013-03-19 NOTE — CV Procedure (Signed)
   Cardiac Catheterization Procedure Note  Name: Corey Murphy MRN: [id number] DOB: 10-03-1950  Procedure: Right Heart Cath, Left Heart Cath, Selective Coronary Angiography, LV angiography  Indication: Heart failure with severely reduced LV systolic function. Multiple risk factors for coronary artery disease.   Procedural Details: The right groin was prepped, draped, and anesthetized with 1% lidocaine. Using the modified Seldinger technique a 5 French sheath was placed in the right femoral artery and a 7 French sheath was placed in the right femoral vein. A Swan-Ganz catheter was used for the right heart catheterization. Standard protocol was followed for recording of right heart pressures and sampling of oxygen saturations. Fick cardiac output was calculated. Standard Judkins catheters were used for selective coronary angiography and left ventriculography. There were no immediate procedural complications. The patient was transferred to the post catheterization recovery area for further monitoring.  Procedural Findings: Hemodynamics RA 7 mmHg RV forty-eight over three mmHg PA 50/20 mmHg PCWP 20 mmHg LV 101/10 mmHg . LVEDP: 19 mmHg AO 102/81 mmHg  Oxygen saturations: PA 55% and 60% AO 97%  Cardiac Output (Fick) 3.74  Cardiac Index (Fick) 1. 79   Aortic Valve: Peak to Peak gradient: Not significant  Pulmonary vascular resistance (PVR): 2.1 Woods units.  Coronary angiography: Coronary dominance: Right   Left Main:  Normal  Left Anterior Descending (LAD):  Normal.  1st diagonal (D1):  Large in size with no significant disease.  2nd diagonal (D2):  Very small in size.  3rd diagonal (D3):  Very small in size.  Circumflex (LCx):  Normal in size and nondominant. The vessel has no significant disease.  1st obtuse marginal:  Large in size with no significant disease.  2nd obtuse marginal:  Medium in size with no significant disease.  3rd obtuse marginal:  Normal in size with  no significant disease.   Right Coronary Artery: Large in size and dominant. The vessel has no significant disease.   Left ventriculography: Left ventricular systolic function is severely reduced , LVEF is estimated at 15 %, there is mild mitral regurgitation   Final Conclusions:   1. Mildly elevated filling pressures with mild pulmonary hypertension. Severely reduced cardiac output.  2. Normal coronary arteries.  3. Severely reduced LV systolic function with an ejection fraction of 15%.   Recommendations:  The patient has nonischemic cardiomyopathy. Recommend aggressive medical therapy. I switched metoprolol to carvedilol. Continue oral diuresis. I added spironolactone. Up titrate heart failure medications and recheck ejection fraction in 3 months.   Lorine Bears MD, [facility] 03/19/2013, 4:55 PM

## 2013-03-19 NOTE — Progress Notes (Signed)
Consulting cardiologist: Dr. Jonelle Sidle  Subjective:   Eating breakfast, breathing comfortably. No chest pain or palpitations.   Objective:   Temp:  [98 F (36.7 C)-98.5 F (36.9 C)] 98.4 F (36.9 C) (11/18 2000) Pulse Rate:  [46-117] 100 (11/19 0800) Resp:  [12-33] 19 (11/19 0800) BP: (108-155)/(68-133) 123/90 mmHg (11/19 0800) SpO2:  [93 %-100 %] 97 % (11/19 0800) Weight:  [216 lb 4.3 oz (98.1 kg)] 216 lb 4.3 oz (98.1 kg) (11/19 0500) Last BM Date: 03/17/13  Filed Weights   03/17/13 2210 03/18/13 0500 03/19/13 0500  Weight: 218 lb 7.6 oz (99.1 kg) 220 lb 7.4 oz (100 kg) 216 lb 4.3 oz (98.1 kg)    Intake/Output Summary (Last 24 hours) at 03/19/13 0847 Last data filed at 03/19/13 0200  Gross per 24 hour  Intake    560 ml  Output   1725 ml  Net  -1165 ml    Telemetry: Sinus rhythm to sinus tachycardia.  Exam:  General: Appears comfortable.  Lungs: Few rhonchi, nonlabored, no wheezing.  Cardiac: RRR, S3 noted.  Abdomen: N/ABS.  Extremities: No pitting.  Lab Results:  Basic Metabolic Panel:  Recent Labs Lab 03/17/13 1620 03/18/13 0423 03/19/13 0428  NA 137 141 142  K 3.5 3.4* 3.6  CL 98 100 103  CO2 23 27 26   GLUCOSE 225* 128* 130*  BUN 12 10 17   CREATININE 1.10 1.16 1.17  CALCIUM 9.9 9.7 10.1    CBC:  Recent Labs Lab 03/17/13 1620  WBC 8.0  HGB 14.0  HCT 41.2  MCV 93.0  PLT 343    Cardiac Enzymes:  Recent Labs Lab 03/17/13 2235 03/18/13 0423 03/18/13 1049  TROPONINI <0.30 <0.30 <0.30    BNP:  Recent Labs  03/17/13 1620  PROBNP 1726.0*    Echocardiogram: Study Conclusions  - Left ventricle: The cavity size was normal. Wall thickness was increased in a pattern of moderate LVH. Systolic function was severely reduced. The estimated ejection fraction was in the range of 20% to 25%. Diffuse hypokinesis. There is severe hypokinesis of the basal-midinferior myocardium. There is severe hypokinesis of the  distalanterior myocardium. There is severe hypokinesis of the mid-distalinferolateral myocardium. Doppler parameters are consistent with abnormal left ventricular relaxation (grade 1 diastolic dysfunction). - Mitral valve: Calcified annulus. Mild regurgitation. - Left atrium: The atrium was moderately dilated. - Right ventricle: Systolic function was moderately reduced. - Right atrium: Central venous pressure: 3mm Hg (est). - Atrial septum: No defect or patent foramen ovale was identified. - Tricuspid valve: Trivial regurgitation. - Pulmonary arteries: PA peak pressure: 31mm Hg (S). - Pericardium, extracardiac: There was no pericardial effusion. Impressions:  - No prior study for comparison. Moderate LVH with LVEF approximately 20-25%, diffuse hypokinesis with regional variation as noted, grade 1 diastolic dysfunction. Moderate left atrial enlargement. MAC with mild mitral regurgitation. Moderately reduced RV contraction. Trivial tricusdpid regurgitation with PASP 31 mmHg. No pericardial effusion.   Medications:   Scheduled Medications: . aspirin  81 mg Oral Daily  . enoxaparin (LOVENOX) injection  40 mg Subcutaneous Q24H  . folic acid  1 mg Oral Daily  . insulin aspart  0-9 Units Subcutaneous TID WC  . lisinopril  20 mg Oral Daily  . metoprolol tartrate  25 mg Oral BID  . multivitamin with minerals  1 tablet Oral Daily  . potassium chloride  20 mEq Oral Daily  . simvastatin  20 mg Oral QPM  . sodium chloride  3 mL Intravenous Q12H  .  thiamine  100 mg Oral Daily   Or  . thiamine  100 mg Intravenous Daily      PRN Medications:  sodium chloride, acetaminophen, hydrALAZINE, LORazepam, LORazepam, methocarbamol, ondansetron (ZOFRAN) IV, sodium chloride   Assessment:   1. Acute systolic heart failure, presentation with pulmonary edema and hypertensive urgency. Echocardiogram shows LVEF 20-25%, diffuse hypokinesis with some regional variation and grade 1 diastolic  dysfunction. Clinically improving with diuresis, blood pressure much better. Cardiac markers argue against ACS.  2. Regular alcohol intake on the weekends, reportedly at least a 12 pack of beer. Potential contributor to cardiomyopathy although not a certainty.  3. Known history of hypertension, on lisinopril as an outpatient.  4. Type 2 diabetes mellitus.  5. Hyperlipidemia, LDL 73 on Pravachol.   Plan/Discussion:    Discussed with patient and family member in room. Have recommended transfer to [facility] in anticipation of a left and right heart catheterization for further evaluation of newly diagnosed cardiomyopathy and acute systolic heart failure. Risks and benefits were discussed, and he is in agreement.   Jonelle Sidle, M.D., F.A.C.C.

## 2013-03-19 NOTE — Progress Notes (Signed)
[facility] PROGRESS NOTE  Navy Belay ZOX:[id number] DOB: 1950-05-02 DOA: 03/17/2013 PCP: Kirk Ruths, MD  Assessment/Plan: Hypertensive emergency: Clinically resolved at this point. Off nitroglycerin infusion. Precipitating factor unclear, possibly heart failure. No evidence of ACS at this point. Plans are for cardiac catheterization at [facility]. Continue aspirin, Lasix, lisinopril, metoprolol.   Acute systolic congestive heart failure: Appears compensated at this time.  Plans are for transfer to [facility] for cardiac catheterization.    Diabetes mellitus: start sliding scale insulin. Hold oral medications.   Alcohol use: CIWA, monitor for w/d. No signs of withdrawal at this point.  Code Status: full code Family Communication: discussed with family and patient at the bedside Disposition Plan: transfer to [facility] for cardiac catheterization   Consultants:  [facility]  Procedures: Echo: - Left ventricle: The cavity size was normal. Wall thickness was increased in a pattern of moderate LVH. Systolic function was severely reduced. The estimated ejection fraction was in the range of 20% to 25%. Diffuse hypokinesis. There is severe hypokinesis of the basal-midinferior myocardium. There is severe hypokinesis of the distalanterior myocardium. There is severe hypokinesis of the mid-distalinferolateral myocardium. Doppler parameters are consistent with abnormal left ventricular relaxation (grade 1 diastolic dysfunction). - Mitral valve: Calcified annulus. Mild regurgitation. - Left atrium: The atrium was moderately dilated. - Right ventricle: Systolic function was moderately reduced. - Right atrium: Central venous pressure: 3mm Hg (est). - Atrial septum: No defect or patent foramen ovale was identified. - Tricuspid valve: Trivial regurgitation. - Pulmonary arteries: PA peak pressure: 31mm Hg (S). - Pericardium, extracardiac: There was no  pericardial effusion. Impressions:  - No prior study for comparison. Moderate LVH with LVEF approximately 20-25%, diffuse hypokinesis with regional variation as noted, grade 1 diastolic dysfunction. Moderate left atrial enlargement. MAC with mild mitral regurgitation. Moderately reduced RV contraction. Trivial tricusdpid regurgitation with PASP 31 mmHg. No pericardial effusion.    Antibiotics:  none  HPI/Subjective: No chest pain or shortness of breath  Objective: Filed Vitals:   03/19/13 0800  BP: 123/90  Pulse: 100  Temp:   Resp: 19    Intake/Output Summary (Last 24 hours) at 03/19/13 1004 Last data filed at 03/19/13 0200  Gross per 24 hour  Intake    560 ml  Output   1725 ml  Net  -1165 ml   Filed Weights   03/17/13 2210 03/18/13 0500 03/19/13 0500  Weight: 99.1 kg (218 lb 7.6 oz) 100 kg (220 lb 7.4 oz) 98.1 kg (216 lb 4.3 oz)    Exam:   General:  NAD  Cardiovascular: S1, S2 RRR  Respiratory: CTA B  Abdomen: soft, nt, nd, bs+  Musculoskeletal: no edema b/l   Data Reviewed: Basic Metabolic Panel:  Recent Labs Lab 03/17/13 1620 03/18/13 0423 03/19/13 0428  NA 137 141 142  K 3.5 3.4* 3.6  CL 98 100 103  CO2 23 27 26   GLUCOSE 225* 128* 130*  BUN 12 10 17   CREATININE 1.10 1.16 1.17  CALCIUM 9.9 9.7 10.1   Liver Function Tests: No results found for this basename: AST, ALT, ALKPHOS, BILITOT, PROT, ALBUMIN,  in the last 168 hours No results found for this basename: LIPASE, AMYLASE,  in the last 168 hours No results found for this basename: AMMONIA,  in the last 168 hours CBC:  Recent Labs Lab 03/17/13 1620  WBC 8.0  NEUTROABS 4.3  HGB 14.0  HCT 41.2  MCV 93.0  PLT 343   Cardiac Enzymes:  Recent Labs Lab  03/17/13 2235 03/18/13 0423 03/18/13 1049  TROPONINI <0.30 <0.30 <0.30   BNP (last 3 results)  Recent Labs  03/17/13 1620  PROBNP 1726.0*   CBG:  Recent Labs Lab 03/18/13 1637 03/18/13 2216 03/18/13 2232  03/18/13 2248 03/19/13 0752  GLUCAP 113* 59* 63* 103* 127*    Recent Results (from the past 240 hour(s))  MRSA PCR SCREENING     Status: None   Collection Time    03/17/13  9:52 PM      Result Value Range Status   MRSA by PCR NEGATIVE  NEGATIVE Final   Comment:            The GeneXpert MRSA Assay (FDA     approved for NASAL specimens     only), is one component of a     comprehensive MRSA colonization     surveillance program. It is not     intended to diagnose MRSA     infection nor to guide or     monitor treatment for     MRSA infections.     Studies: Dg Chest Portable 1 View  03/17/2013   CLINICAL DATA:  Shortness of breath.  EXAM: PORTABLE CHEST - 1 VIEW  COMPARISON:  03/23/2009  FINDINGS: The cardiac silhouette is enlarged. There is prominence of the interstitial markings and indistinctness of the pulmonary vasculature. Peribronchial cuffing is identified. Fluid versus thickening is identified along the minor fissure on the right. Areas of increased density project within the perihilar regions. There are findings consistent with cephalization. The visualized bony skeleton is unremarkable.  IMPRESSION: 1. Interstitial infiltrate likely reflecting pulmonary edema an infectious or inflammatory infiltrate cannot be excluded clinically appropriate.   Electronically Signed   By: Salome Holmes M.D.   On: 03/17/2013 16:42    Scheduled Meds: . aspirin  81 mg Oral Daily  . enoxaparin (LOVENOX) injection  40 mg Subcutaneous Q24H  . folic acid  1 mg Oral Daily  . insulin aspart  0-9 Units Subcutaneous TID WC  . lisinopril  20 mg Oral Daily  . metoprolol tartrate  25 mg Oral BID  . multivitamin with minerals  1 tablet Oral Daily  . potassium chloride  20 mEq Oral Daily  . simvastatin  20 mg Oral QPM  . sodium chloride  3 mL Intravenous Q12H  . thiamine  100 mg Oral Daily   Or  . thiamine  100 mg Intravenous Daily   Continuous Infusions:   Principal Problem:   Acute pulmonary  edema Active Problems:   Hypercholesteremia   Hypertension   Diabetes mellitus   Hypertensive urgency   SOB (shortness of breath)   Sinus tachycardia   Acute systolic heart failure    Time spent:    [facility]  [facility] Pager [phone]. If 7PM-7AM, please contact night-coverage at [email or web address], password [facility] 03/19/2013, 10:04 AM  LOS: 2 days

## 2013-03-20 ENCOUNTER — Encounter (HOSPITAL_COMMUNITY): Payer: Self-pay | Admitting: Nurse Practitioner

## 2013-03-20 DIAGNOSIS — I428 Other cardiomyopathies: Secondary | ICD-10-CM | POA: Diagnosis present

## 2013-03-20 LAB — BASIC METABOLIC PANEL
BUN: 16 mg/dL (ref 6–23)
CO2: 28 mEq/L (ref 19–32)
Chloride: 104 mEq/L (ref 96–112)
Creatinine, Ser: 1.27 mg/dL (ref 0.50–1.35)
Glucose, Bld: 135 mg/dL — ABNORMAL HIGH (ref 70–99)
Potassium: 4.2 mEq/L (ref 3.5–5.1)
Sodium: 142 mEq/L (ref 135–145)

## 2013-03-20 LAB — GLUCOSE, CAPILLARY
Glucose-Capillary: 136 mg/dL — ABNORMAL HIGH (ref 70–99)
Glucose-Capillary: 141 mg/dL — ABNORMAL HIGH (ref 70–99)
Glucose-Capillary: 109 mg/dL — ABNORMAL HIGH (ref 70–99)

## 2013-03-20 MED ORDER — FUROSEMIDE 10 MG/ML IJ SOLN
40.0000 mg | Freq: Two times a day (BID) | INTRAMUSCULAR | Status: DC
  Administered 2013-03-20 – 2013-03-21 (×3): 40 mg via INTRAVENOUS
  Filled 2013-03-20 (×4): qty 4

## 2013-03-20 NOTE — Progress Notes (Signed)
Patient Name: Corey Murphy Date of Encounter: 03/20/2013   Principal Problem:   Acute pulmonary edema Active Problems:   Hypertension   Hypertensive urgency   Acute systolic congestive heart failure   Nonischemic cardiomyopathy   Diabetes mellitus   Hypercholesteremia   Sinus tachycardia   SUBJECTIVE  No chest pain or sob overnight.  Did not sleep well but says that that is common for him.  CURRENT MEDS  . aspirin  81 mg Oral Daily  . carvedilol  3.125 mg Oral BID WC  . enoxaparin (LOVENOX) injection  40 mg Subcutaneous Q24H  . folic acid  1 mg Oral Daily  . furosemide  40 mg Oral BID  . insulin aspart  0-9 Units Subcutaneous TID WC  . lisinopril  20 mg Oral Daily  . multivitamin with minerals  1 tablet Oral Daily  . potassium chloride  20 mEq Oral Daily  . simvastatin  20 mg Oral QPM  . sodium chloride  3 mL Intravenous Q12H  . spironolactone  25 mg Oral Daily  . thiamine  100 mg Oral Daily   Or  . thiamine  100 mg Intravenous Daily   OBJECTIVE  Filed Vitals:   03/19/13 2100 03/20/13 0019 03/20/13 0402 03/20/13 0500  BP: 135/95 126/89 135/101   Pulse: 93 99 103   Temp:  98.3 F (36.8 C) 98.4 F (36.9 C)   TempSrc:  Oral Oral   Resp:  18 20   Height:      Weight:  217 lb 9.5 oz (98.7 kg)  217 lb 9.5 oz (98.7 kg)  SpO2: 97% 99% 98%     Intake/Output Summary (Last 24 hours) at 03/20/13 0656 Last data filed at 03/20/13 0600  Gross per 24 hour  Intake   1120 ml  Output    875 ml  Net    245 ml   Filed Weights   03/19/13 0500 03/20/13 0019 03/20/13 0500  Weight: 216 lb 4.3 oz (98.1 kg) 217 lb 9.5 oz (98.7 kg) 217 lb 9.5 oz (98.7 kg)   PHYSICAL EXAM  General: Pleasant, NAD. Neuro: Alert and oriented X 3. Moves all extremities spontaneously. Psych: Normal affect. HEENT:  Normal  Neck: Supple without bruits, jvd to jaw. Lungs:  Resp regular and unlabored, CTA. Heart: RRR no s3, + s4, no murmurs. Abdomen: protuberant, firm, non-tender, BS + x 4.    Extremities: No clubbing, cyanosis or edema. DP/PT/Radials 2+ and equal bilaterally.  R groin w/o bleeding, bruit, hematoma.  Accessory Clinical Findings  CBC  Recent Labs  03/17/13 1620  WBC 8.0  NEUTROABS 4.3  HGB 14.0  HCT 41.2  MCV 93.0  PLT 343   Basic Metabolic Panel  Recent Labs  03/19/13 0428 03/20/13 0405  NA 142 142  K 3.6 4.2  CL 103 104  CO2 26 28  GLUCOSE 130* 135*  BUN 17 16  CREATININE 1.17 1.27  CALCIUM 10.1 9.6   Cardiac Enzymes  Recent Labs  03/17/13 2235 03/18/13 0423 03/18/13 1049  TROPONINI <0.30 <0.30 <0.30   Hemoglobin A1C  Recent Labs  03/18/13 1049  HGBA1C 6.6*   Fasting Lipid Panel  Recent Labs  03/19/13 0428  CHOL 144  HDL 49  LDLCALC 73  TRIG 110  CHOLHDL 2.9   Thyroid Function Tests  Recent Labs  03/17/13 2235  TSH 1.426   TELE  rsr  Radiology/Studies  Dg Chest Portable 1 View  03/17/2013   CLINICAL DATA:  Shortness of breath.  EXAM: PORTABLE CHEST - 1 VIEW  IMPRESSION: 1. Interstitial infiltrate likely reflecting pulmonary edema an infectious or inflammatory infiltrate cannot be excluded clinically appropriate.   Electronically Signed   By: Salome Holmes M.D.   On: 03/17/2013 16:42   ASSESSMENT AND PLAN  1. Acute Systolic CHF/NICM/Hypertensive Urgency:  S/p cath yesterday revealing normal cors.  Wedge 20.  Still with volume overload on exam.  Switch to IV lasix today.  Cont bb, acei, spiro.  BP stable.  Remains mildly tachycardic  -likely 2/2 low output.  Plan f/u echo in 3 mos.  2.  DM:  Stable.  Cont SSI.  A1c 6.6.  On metformin @ home, plan to resume on d/c.  3.  HL: LDL 73. Cont statin in setting of DM and minimal coronary dzs.  Signed, Nicolasa Ducking NP Patient examined. No acute distress today. Breathing has improved from admission but still dyspneic. Agree with above assessment and plan. Possibly home tomorrow. Lytes stable so far on spironolactone.

## 2013-03-21 ENCOUNTER — Inpatient Hospital Stay (HOSPITAL_COMMUNITY): Payer: BC Managed Care – PPO

## 2013-03-21 LAB — BASIC METABOLIC PANEL
Calcium: 9.4 mg/dL (ref 8.4–10.5)
GFR calc Af Amer: 63 mL/min — ABNORMAL LOW (ref >90)
GFR calc non Af Amer: 55 mL/min — ABNORMAL LOW (ref >90)
Glucose, Bld: 125 mg/dL — ABNORMAL HIGH (ref 70–99)
Sodium: 144 mEq/L (ref 135–145)

## 2013-03-21 LAB — GLUCOSE, CAPILLARY
Glucose-Capillary: 153 mg/dL — ABNORMAL HIGH (ref 70–99)
Glucose-Capillary: 130 mg/dL — ABNORMAL HIGH (ref 70–99)
Glucose-Capillary: 152 mg/dL — ABNORMAL HIGH (ref 70–99)

## 2013-03-21 MED ORDER — CARVEDILOL 6.25 MG PO TABS
6.2500 mg | ORAL_TABLET | Freq: Two times a day (BID) | ORAL | Status: DC
  Administered 2013-03-21 – 2013-03-22 (×2): 6.25 mg via ORAL
  Filled 2013-03-21 (×4): qty 1

## 2013-03-21 MED ORDER — FUROSEMIDE 40 MG PO TABS
40.0000 mg | ORAL_TABLET | Freq: Two times a day (BID) | ORAL | Status: DC
  Administered 2013-03-21 – 2013-03-22 (×3): 40 mg via ORAL
  Filled 2013-03-21 (×5): qty 1

## 2013-03-21 NOTE — Progress Notes (Signed)
CARDIAC REHAB PHASE I   PRE:  Rate/Rhythm: 88 SR  BP:  Supine:   Sitting: 113/85  Standing:    SaO2: 97 RA  MODE:  Ambulation: 760 ft   POST:  Rate/Rhythm: 95 SR  BP:  Supine:   Sitting: 115/75  Standing:    SaO2: 98 RA 1335-1435 Pt tolerated ambulation well without c/o of cp or SOB. VS stable. Completed CHF education with pt and and wife. They understand CHF zones, low sodium diet, daily weights and when to all MD and 911. They have watched CHF video. We discussed Outpt. CRP, he agrees to referral to [place].   Melina Copa RN 03/21/2013 2:36 PM

## 2013-03-21 NOTE — Progress Notes (Signed)
Patient Name: Corey Murphy Date of Encounter: 03/21/2013   Principal Problem:   Acute pulmonary edema Active Problems:   Hypercholesteremia   Hypertension   Diabetes mellitus   Hypertensive urgency   Sinus tachycardia   Acute systolic congestive heart failure   Nonischemic cardiomyopathy   SUBJECTIVE  No chest pain or sob overnight.  Recorded weight today is spurious (280).  CURRENT MEDS  . aspirin  81 mg Oral Daily  . carvedilol  6.25 mg Oral BID WC  . enoxaparin (LOVENOX) injection  40 mg Subcutaneous Q24H  . folic acid  1 mg Oral Daily  . furosemide  40 mg Oral BID  . insulin aspart  0-9 Units Subcutaneous TID WC  . lisinopril  20 mg Oral Daily  . multivitamin with minerals  1 tablet Oral Daily  . potassium chloride  20 mEq Oral Daily  . simvastatin  20 mg Oral QPM  . sodium chloride  3 mL Intravenous Q12H  . spironolactone  25 mg Oral Daily  . thiamine  100 mg Oral Daily   Or  . thiamine  100 mg Intravenous Daily   OBJECTIVE  Filed Vitals:   03/20/13 1658 03/20/13 2015 03/21/13 0136 03/21/13 0513  BP: 121/88 112/74 115/92 105/79  Pulse: 96 97 93 95  Temp: 97.9 F (36.6 C) 98.7 F (37.1 C) 98.2 F (36.8 C) 98.4 F (36.9 C)  TempSrc: Oral Oral Oral Oral  Resp:  18 18 20   Height: 5\' 7"  (1.702 m)     Weight: 209 lb 14.1 oz (95.2 kg)   280 lb 4.8 oz (127.143 kg)  SpO2: 97% 94% 100% 99%    Intake/Output Summary (Last 24 hours) at 03/21/13 0951 Last data filed at 03/21/13 6295  Gross per 24 hour  Intake   1200 ml  Output   1940 ml  Net   -740 ml   Filed Weights   03/20/13 0500 03/20/13 1658 03/21/13 0513  Weight: 217 lb 9.5 oz (98.7 kg) 209 lb 14.1 oz (95.2 kg) 280 lb 4.8 oz (127.143 kg)   PHYSICAL EXAM  General: Pleasant, NAD. Neuro: Alert and oriented X 3. Moves all extremities spontaneously. Psych: Normal affect. HEENT:  Normal  Neck: Supple without bruits, jvd to jaw. Lungs:  Resp regular and unlabored, CTA. Heart: RRR  S3 gallop present.  Still tachycardic. Abdomen: protuberant, firm, non-tender, BS + x 4.  Extremities: No clubbing, cyanosis or edema. DP/PT/Radials 2+ and equal bilaterally.  R groin w/o bleeding, bruit, hematoma.  Accessory Clinical Findings  CBC No results found for this basename: WBC, NEUTROABS, HGB, HCT, MCV, PLT,  in the last 72 hours Basic Metabolic Panel  Recent Labs  03/20/13 0405 03/21/13 0440  NA 142 144  K 4.2 4.0  CL 104 105  CO2 28 30  GLUCOSE 135* 125*  BUN 16 17  CREATININE 1.27 1.37*  CALCIUM 9.6 9.4   Cardiac Enzymes  Recent Labs  03/18/13 1049  TROPONINI <0.30   Hemoglobin A1C  Recent Labs  03/18/13 1049  HGBA1C 6.6*   Fasting Lipid Panel  Recent Labs  03/19/13 0428  CHOL 144  HDL 49  LDLCALC 73  TRIG 110  CHOLHDL 2.9   Thyroid Function Tests No results found for this basename: TSH, T4TOTAL, FREET3, T3FREE, THYROIDAB,  in the last 72 hours TELE  Sinus tachy.  Radiology/Studies  Dg Chest Portable 1 View  03/17/2013   CLINICAL DATA:  Shortness of breath.  EXAM: PORTABLE CHEST - 1 VIEW  IMPRESSION: 1. Interstitial infiltrate likely reflecting pulmonary edema an infectious or inflammatory infiltrate cannot be excluded clinically appropriate.   Electronically Signed   By: Salome Holmes M.D.   On: 03/17/2013 16:42   ASSESSMENT AND PLAN  1. Acute Systolic CHF/NICM/Hypertensive Urgency: Cath showed nonischemic cardiomyopathy.  Plan f/u echo in 3 mos.  2.  DM:  Stable.  Cont SSI.  A1c 6.6.  On metformin @ home, plan to resume on d/c.  3.  HL: LDL 73. Cont statin in setting of DM and minimal coronary dzs.  Plan: Switch to oral lasix today. Repeat good chest xray today. Ambulate in hall with cardiac rehab. Watch potassium and creatinine another day. Slight bump in creatinine today. Increase carvedilol to 6.25 mg BID. Possibly home in am. Signed, Cassell Clement

## 2013-03-22 ENCOUNTER — Encounter (HOSPITAL_COMMUNITY): Payer: Self-pay | Admitting: Physician Assistant

## 2013-03-22 ENCOUNTER — Telehealth: Payer: Self-pay | Admitting: Internal Medicine

## 2013-03-22 ENCOUNTER — Other Ambulatory Visit: Payer: Self-pay | Admitting: Internal Medicine

## 2013-03-22 DIAGNOSIS — E119 Type 2 diabetes mellitus without complications: Secondary | ICD-10-CM

## 2013-03-22 DIAGNOSIS — F1020 Alcohol dependence, uncomplicated: Secondary | ICD-10-CM

## 2013-03-22 DIAGNOSIS — I119 Hypertensive heart disease without heart failure: Secondary | ICD-10-CM

## 2013-03-22 DIAGNOSIS — E669 Obesity, unspecified: Secondary | ICD-10-CM

## 2013-03-22 DIAGNOSIS — I5041 Acute combined systolic (congestive) and diastolic (congestive) heart failure: Secondary | ICD-10-CM

## 2013-03-22 DIAGNOSIS — I161 Hypertensive emergency: Secondary | ICD-10-CM

## 2013-03-22 DIAGNOSIS — E1165 Type 2 diabetes mellitus with hyperglycemia: Secondary | ICD-10-CM

## 2013-03-22 LAB — BASIC METABOLIC PANEL
BUN: 17 mg/dL (ref 6–23)
CO2: 28 mEq/L (ref 19–32)
Calcium: 9.4 mg/dL (ref 8.4–10.5)
Glucose, Bld: 134 mg/dL — ABNORMAL HIGH (ref 70–99)
Potassium: 3.7 mEq/L (ref 3.5–5.1)
Sodium: 140 mEq/L (ref 135–145)

## 2013-03-22 LAB — GLUCOSE, CAPILLARY: Glucose-Capillary: 159 mg/dL — ABNORMAL HIGH (ref 70–99)

## 2013-03-22 MED ORDER — LISINOPRIL 20 MG PO TABS: 20.0000 mg | ORAL_TABLET | Freq: Every day | ORAL | Status: DC

## 2013-03-22 MED ORDER — FUROSEMIDE 40 MG PO TABS: 40.0000 mg | ORAL_TABLET | Freq: Two times a day (BID) | ORAL | Status: DC

## 2013-03-22 MED ORDER — POTASSIUM CHLORIDE CRYS ER 20 MEQ PO TBCR: 20.0000 meq | EXTENDED_RELEASE_TABLET | Freq: Every day | ORAL | Status: DC

## 2013-03-22 MED ORDER — CARVEDILOL 6.25 MG PO TABS: 6.2500 mg | ORAL_TABLET | Freq: Two times a day (BID) | ORAL | Status: DC

## 2013-03-22 MED ORDER — SPIRONOLACTONE 25 MG PO TABS: 25.0000 mg | ORAL_TABLET | Freq: Every day | ORAL | Status: DC

## 2013-03-22 NOTE — Discharge Summary (Signed)
Discharge Summary   Patient ID: Corey Murphy,  MRN: [id number], DOB/AGE: [birth year] 60 y.o.  Admit date: 03/17/2013 Discharge date: 03/22/2013  Primary Physician: Kirk Ruths, MD Primary Cardiologist: Ival Bible, MD  Discharge Diagnoses Principal Problem:   Acute combined systolic and diastolic CHF, NYHA class 3 Active Problems:   Hypertensive emergency   Nonischemic cardiomyopathy   Hypertensive heart disease   Hypercholesteremia   Hypertension   Type 2 diabetes mellitus   Obesity   Alcohol dependency  Allergies No Known Allergies  Diagnostic Studies/Procedures  PORTABLE CHEST X-RAY - 03/17/13  IMPRESSION:  1. Interstitial infiltrate likely reflecting pulmonary edema an  infectious or inflammatory infiltrate cannot be excluded clinically  appropriate.   TRANSTHORACIC ECHOCARDIOGRAM - 03/18/13  No prior study for comparison. Moderate LVH with LVEF approximately 20-25%, diffuse hypokinesis with regional variation as noted, grade 1 diastolic dysfunction. Moderate left atrial enlargement. MAC with mild mitral regurgitation. Moderately reduced RV contraction. Trivial tricusdpid regurgitation with PASP 31 mmHg. No pericardial effusion.   CARDIAC CATHETERIZATION - 03/19/13  Hemodynamics  RA 7 mmHg  RV forty-eight over three mmHg  PA 50/20 mmHg  PCWP 20 mmHg  LV 101/10 mmHg . LVEDP: 19 mmHg  AO 102/81 mmHg  Oxygen saturations:  PA 55% and 60%  AO 97%  Cardiac Output (Fick) 3.74  Cardiac Index (Fick) 1. 79  Aortic Valve: Peak to Peak gradient: Not significant  Pulmonary vascular resistance (PVR): 2.1 Woods units.  Coronary angiography:  Coronary dominance: Right  Left Main: Normal  Left Anterior Descending (LAD): Normal.  1st diagonal (D1): Large in size with no significant disease.  2nd diagonal (D2): Very small in size.  3rd diagonal (D3): Very small in size.  Circumflex (LCx): Normal in size and nondominant. The vessel has no significant disease.    1st obtuse marginal: Large in size with no significant disease.  2nd obtuse marginal: Medium in size with no significant disease.  3rd obtuse marginal: Normal in size with no significant disease.  Right Coronary Artery: Large in size and dominant. The vessel has no significant disease. Left ventriculography: Left ventricular systolic function is severely reduced , LVEF is estimated at 15 %, there is mild mitral regurgitation  Final Conclusions:  1. Mildly elevated filling pressures with mild pulmonary hypertension. Severely reduced cardiac output.  2. Normal coronary arteries.  3. Severely reduced LV systolic function with an ejection fraction of 15%.   PA/LATERAL CHEST X-RAY - 03/21/13  IMPRESSION:  No evidence of acute cardiopulmonary disease.  Prior interstitial edema has resolved.  History of Present Illness  Corey Murphy is a 62 y.o. male who was admitted to [facility] 03/18/13 with the above problem list.   He has a PMHx s/f DM2, HTN and HLD. He was admitted with shortness of breath worsening x 2 weeks. He noticed some congestion, cough and shortness of breath after receiving a flu shot 6 weeks ago. About two weeks ago, he reported experiencing worsening dyspnea on exertion, dizziness and fatigue. He was unable to climb a flight of stairs w/o becoming dyspneic. He also noticed abdominal distention, PND and orthopnea. He presented to the [facility] for further evaluation.   There, he was noted to be markedly hypertensive (SBP 190s). EKG revealed no ischemic changes. Initial TnI returned WNL. Pro BNP was elevated at 1726. CXR indicated pulmonary edema. He was started on Lasix IV, NTG gtt and NTG paste. He was admitted to the ICU for ongoing management.   Hospital Course  He diuresed ~ 2 L and symptoms markedly improved. BP normalized. 2D echo as above revealed LVEF 20-25%, moderate LVH, diffuse HK, severe HK of the basal midinferior myocardium, distalanterior myocardium and  mid-distal inferolateral myocardium, grade 1 diastolic dysfunction, moderate LAE, mild MR, moderate RV systolic dysfunction. Three subsequent troponins returned WNL. The patient was diagnosed with hypertensive emergency; however, given evidence of a newly reduced EF and focal WMAs, the decision was made to proceed with cardiac catheterization at [place].   Just prior to transfer, he developed an episode of face numbness, right hand tingling and slurred speech lasting ~ 10 seconds. He was formally evaluated by Dr. Kerry Hough with the internal medicine team. Exam revealed no neurological deficits. He was noted to be hypertensive (SBP 170s) during this episode. A component of anxiety was suspected to be contributing. He was continued on aspirin and deemed stable for transfer.   He was transferred to [facility] and arrived in stable condition.   He was informed, consented and prepped for cardiac catheterization later that afternoon which was accessed via the R femoral artery. As above, this revealed angiographically normal coronaries, EF 15%, severely reduced cardiac output, mildly elevated filling pressures and mild pulmonary hypertension. The patient tolerated the procedure well without complications. The recommendation was made to pursue aggressive medical therapy. He was continued on ACEi therapy. Metoprolol tartrate was replaced with carvedilol. Spironolactone was added.  Oral diuresis was initially resumed, then switched to IV diuresis for increased UOP.   He diuresed well and symptoms improved. Repeat CXR on 11/21 confirmed improved lung aeration and resolution of pulmonary edema. Lasix was transitioned to oral formulation 11/21 with continued diuresis. He ambulated well with cardiac rehab w/o complaint of chest pain or shortness of breath. He was evaluated by Dr. Graciela Husbands this morning. The benefits of primary prevention with LifeVest placement were discussed. This was arranged and the patient was  deemed stable for discharge.   He will be discharged on the medication regimen outlined below. The patient's nonischemic cardiomyopathy was suspected to be secondary to hypertension with contribution from excessive alcohol intake. The goal will be to pursue optimized medical therapy with a repeat 2D echo in 3 months to determine the need for ICD placement for primary prevention. He will follow-up in the [place] office in 7 days as part of transitional care management. BMET should be checked at that time to assess renal function and potassium with the initiation of HF medications. This information, including post-cath activity restrictions and CHF education, has been clearly outlined in the discharge AVS.    Total I/O: -4509 mL Admission weight: 225 lbs Discharge weight: 209 lbs  Discharge Vitals:  Blood pressure 118/79, pulse 93, temperature 98 F (36.7 C), temperature source Oral, resp. rate 18, height 5\' 7"  (1.702 m), weight 95.029 kg (209 lb 8 oz), SpO2 97.00%.   Weight change: -0.172 kg (-6.1 oz)  Labs:  Recent Labs Lab 03/20/13 0405 03/21/13 0440 03/22/13 0525  NA 142 144 140  K 4.2 4.0 3.7  CL 104 105 100  CO2 28 30 28   BUN 16 17 17   CREATININE 1.27 1.37* 1.34  CALCIUM 9.6 9.4 9.4  GLUCOSE 135* 125* 134*   Disposition:  Discharge Orders   Future Orders Complete By Expires   Basic metabolic panel  03/27/2013 (Approximate) 03/22/2014   Questions:     Has the patient fasted?:     Amb Referral to Cardiac Rehabilitation  As directed  Follow-up Information   Follow up with Joni Reining, NP In 1 week. (Office will call you with an appointment date & time in 1 week. )    Specialty:  Nurse Practitioner   Contact information:   [address] [place] [address] [phone]       Follow up with Kirk Ruths, MD. Schedule an appointment as soon as possible for a visit in 1 week. (For general post-hospital follow-up. )    Specialty:  Family  Medicine   Contact information:   [address] [place] [address] [phone]       Discharge Medications:    Medication List    STOP taking these medications       methocarbamol 500 MG tablet  Commonly known as:  ROBAXIN     naproxen 500 MG tablet  Commonly known as:  NAPROSYN      TAKE these medications       aspirin 81 MG tablet  Take 81 mg by mouth daily.     carvedilol 6.25 MG tablet  Commonly known as:  COREG  Take 1 tablet (6.25 mg total) by mouth 2 (two) times daily with a meal.     fluticasone 50 MCG/ACT nasal spray  Commonly known as:  FLONASE  Place 1 spray into the nose daily.     furosemide 40 MG tablet  Commonly known as:  LASIX  Take 1 tablet (40 mg total) by mouth 2 (two) times daily.     glimepiride 4 MG tablet  Commonly known as:  AMARYL  Take 4 mg by mouth daily before breakfast.     lisinopril 20 MG tablet  Commonly known as:  PRINIVIL,ZESTRIL  Take 20 mg by mouth daily.     metFORMIN 1000 MG tablet  Commonly known as:  GLUCOPHAGE  Take 1,000 mg by mouth 2 (two) times daily with a meal.     potassium chloride SA 20 MEQ tablet  Commonly known as:  K-DUR,KLOR-CON  Take 1 tablet (20 mEq total) by mouth daily.     simvastatin 20 MG tablet  Commonly known as:  ZOCOR  Take 20 mg by mouth every evening.     spironolactone 25 MG tablet  Commonly known as:  ALDACTONE  Take 1 tablet (25 mg total) by mouth daily.     TRADJENTA 5 MG Tabs tablet  Generic drug:  linagliptin  Take 5 mg by mouth daily.       Outstanding Labs/Studies: BMET in 1 week; LFTs/lipid panel in 6 weeks  Duration of Discharge Encounter: Greater than 30 minutes including physician time.  Signed, R. Hurman Horn, PA-C 03/22/2013, 4:10 PM

## 2013-03-22 NOTE — Progress Notes (Signed)
Ambulated in hallway with wife, tolerated without any issues or complaints.

## 2013-03-22 NOTE — Progress Notes (Signed)
CHF education completed utilizing teach back method.  Client and spouse verbalized understanding of information and the importance.  They agreed to purchase a weight scal and BP machine.  We spoke of the importance of not drinking and how it affects the heart and other systems.  He realizes the importance and says, "I will try".  Discharged with Life Vest on and its connecting parts.

## 2013-03-22 NOTE — Telephone Encounter (Signed)
I was called by Joycie Peek who stated that patient called and wasn't able to fill his heart failure medications at the pharmacy because the pharmacy was closed. He is not able to take the dose of cortex 6.25 mg and Lasix 40 mg tonight.  I sent the prescription for carvedilol, spironolactone, lisinopril, Lasix to the [facility] in [place], [place]. Patient will go there to to get his heart failure medications first thing in the morning. He was instructed to come back to the emergency department if he develops more shortness of breath.

## 2013-03-22 NOTE — Progress Notes (Signed)
Patient Name: Corey Murphy      SUBJECTIVE: 62 year old man with multiple cardiac risk factors including hypertension and diabetes admitted with acute pulmonary edema and found to have severe left ventricular dysfunction confirmed by catheterization with largely nonobstructive coronary disease  He has been started on beta blockers aldosterone antagonists and ACE inhibitors  She is walking the halls without difficulty  Past Medical History  Diagnosis Date  . Type 2 diabetes mellitus   . Essential hypertension, benign   . Hypercholesteremia   . Colonic adenoma 2006  . Chronic systolic CHF (congestive heart failure)     a. 03/2013 Echo: EF 20-25%, diff HK, sev HK of basal-mid inf, mid-dist inflat and dist antlat walls. Gr 1 DD, mild MR.  . Nonischemic cardiomyopathy     a. 03/2013 Cath: LM nl, LAD nl, D1/2/3 small, nl, LCX nondom, nl, OM1/2/3 nl, RCA dom, nl, EF 15%, mild MR.    Scheduled Meds:  Scheduled Meds: . aspirin  81 mg Oral Daily  . carvedilol  6.25 mg Oral BID WC  . enoxaparin (LOVENOX) injection  40 mg Subcutaneous Q24H  . folic acid  1 mg Oral Daily  . furosemide  40 mg Oral BID  . insulin aspart  0-9 Units Subcutaneous TID WC  . lisinopril  20 mg Oral Daily  . multivitamin with minerals  1 tablet Oral Daily  . potassium chloride  20 mEq Oral Daily  . simvastatin  20 mg Oral QPM  . sodium chloride  3 mL Intravenous Q12H  . spironolactone  25 mg Oral Daily  . thiamine  100 mg Oral Daily   Or  . thiamine  100 mg Intravenous Daily   Continuous Infusions:   PHYSICAL EXAM Filed Vitals:   03/21/13 1340 03/21/13 1600 03/21/13 2142 03/22/13 0540  BP: 100/63 104/74 117/92 109/78  Pulse: 78 90 91 93  Temp: 98.2 F (36.8 C) 97.9 F (36.6 C) 97.2 F (36.2 C) 98.1 F (36.7 C)  TempSrc: Oral Oral Oral Oral  Resp: 17 16 18 18   Height:      Weight:    209 lb 8 oz (95.029 kg)  SpO2: 100% 99% 100% 98%    Well developed and nourished in no acute  distress HENT normal Neck supple with JVP-flat Clear Regular rate and rhythm, no murmurs or gallops Abd-soft with active BS No Clubbing cyanosis edema Skin-warm and dry A & Oriented  Grossly normal sensory and motor function   TELEMETRY: Reviewed telemetry pt in NSR    Intake/Output Summary (Last 24 hours) at 03/22/13 0832 Last data filed at 03/22/13 4540  Gross per 24 hour  Intake    820 ml  Output   1300 ml  Net   -480 ml    LABS: Basic Metabolic Panel:  Recent Labs Lab 03/17/13 1620 03/18/13 0423 03/19/13 0428 03/20/13 0405 03/21/13 0440 03/22/13 0525  NA 137 141 142 142 144 140  K 3.5 3.4* 3.6 4.2 4.0 3.7  CL 98 100 103 104 105 100  CO2 23 27 26 28 30 28   GLUCOSE 225* 128* 130* 135* 125* 134*  BUN 12 10 17 16 17 17   CREATININE 1.10 1.16 1.17 1.27 1.37* 1.34  CALCIUM 9.9 9.7 10.1 9.6 9.4 9.4   Cardiac Enzymes: No results found for this basename: CKTOTAL, CKMB, CKMBINDEX, TROPONINI,  in the last 72 hours CBC:  Recent Labs Lab 03/17/13 1620  WBC 8.0  NEUTROABS 4.3  HGB 14.0  HCT 41.2  MCV 93.0  PLT 343    BNP (last 3 results)  Recent Labs  03/17/13 1620  PROBNP 1726.0*      ASSESSMENT AND PLAN:  Principal Problem:   Acute pulmonary edema Active Problems:   Hypercholesteremia   Hypertension   Diabetes mellitus   Hypertensive urgency   Sinus tachycardia   Acute systolic congestive heart failure   Nonischemic cardiomyopathy  We have had a lengthy discussion regarding the implications of cardiomyopathy newly diagnosed is associated mortality risks and the importance of his medications. We also discussed the potential value of a LifeVest  we will plan to arrange this prior to discharge  Signed, Sherryl Manges MD  03/22/2013

## 2013-03-23 MED ORDER — POTASSIUM CHLORIDE CRYS ER 20 MEQ PO TBCR: 20.0000 meq | EXTENDED_RELEASE_TABLET | Freq: Every day | ORAL | Status: DC

## 2013-03-23 MED ORDER — SPIRONOLACTONE 25 MG PO TABS: 25.0000 mg | ORAL_TABLET | Freq: Every day | ORAL | Status: DC

## 2013-03-23 MED ORDER — CARVEDILOL 6.25 MG PO TABS: 6.2500 mg | ORAL_TABLET | Freq: Two times a day (BID) | ORAL | Status: DC

## 2013-03-23 MED ORDER — LISINOPRIL 20 MG PO TABS: 20.0000 mg | ORAL_TABLET | Freq: Every day | ORAL | Status: DC

## 2013-03-23 MED ORDER — FUROSEMIDE 40 MG PO TABS: 40.0000 mg | ORAL_TABLET | Freq: Two times a day (BID) | ORAL | Status: DC

## 2013-03-31 ENCOUNTER — Ambulatory Visit (INDEPENDENT_AMBULATORY_CARE_PROVIDER_SITE_OTHER): Payer: BC Managed Care – PPO | Admitting: Cardiology

## 2013-03-31 ENCOUNTER — Encounter: Payer: Self-pay | Admitting: Cardiology

## 2013-03-31 VITALS — BP 108/72 | HR 97 | Ht 67.0 in | Wt 210.0 lb

## 2013-03-31 DIAGNOSIS — I10 Essential (primary) hypertension: Secondary | ICD-10-CM

## 2013-03-31 DIAGNOSIS — I428 Other cardiomyopathies: Secondary | ICD-10-CM

## 2013-03-31 DIAGNOSIS — F1020 Alcohol dependence, uncomplicated: Secondary | ICD-10-CM

## 2013-03-31 DIAGNOSIS — E119 Type 2 diabetes mellitus without complications: Secondary | ICD-10-CM

## 2013-03-31 NOTE — Assessment & Plan Note (Signed)
Keep followup with Dr. McGough. 

## 2013-03-31 NOTE — Patient Instructions (Addendum)
Your physician recommends that you schedule a follow-up appointment in: ONE MONTH  Your physician recommends that you return for lab work in: PRIOR TO YOUR FOLLOW UP OFFICE VISIT FOR BMET  Your physician has recommended you make the following change in your medication:   1) INCREASE YOUR COREG TO 9.375MG  TWICE DAILY, TAKE ONE AND A HALF TABLETS OF YOUR CURRENT 6.25MG  TWICE DAILY

## 2013-03-31 NOTE — Assessment & Plan Note (Signed)
Blood pressure well controlled at this time. 

## 2013-03-31 NOTE — Assessment & Plan Note (Signed)
Patient states that he has cut out alcohol completely.

## 2013-03-31 NOTE — Assessment & Plan Note (Signed)
LVEF approximately 20%, clinically improved on medical therapy. He is wearing a LifeVest for now, we will followup echocardiogram over the next few months. Increase Coreg to 9.375 mg twice daily. Followup arranged with BMET.

## 2013-03-31 NOTE — Progress Notes (Signed)
Clinical Summary Corey Murphy is a 62 y.o.male presenting for a post hospital followup visit. He was seen in consultation with newly diagnosed cardiomyopathy associated with acute heart failure symptoms in the setting of hypertensive urgency. He was transferred to [facility] and underwent cardiac catheterization on 11/19 demonstrating no significant obstructive CAD, PASP 50 mm mercury, wedge pressure 20 mm mercury, cardiac output 3.7 and cardiac index 1.7. He underwent medical therapy adjustments, also diuresed. He improved clinically, was seen by Dr. Graciela Husbands who discussed LifeVest with the patient, also potential ICD placement later after followup assessment of LVEF on medical therapy.   He is here with his wife today. Feels better, NYHA class II dyspnea, no chest pain. He reports compliance with sodium restriction, fluid restriction, daily weights, and medications. Blood pressure has been well controlled. He has had no palpitations or dizziness. He is wearing the LifeVest - no shocks or alarms.  Lab work from November 22 showed potassium 3.7, BUN 17, creatinine 1.3.   No Known Allergies  Current Outpatient Prescriptions  Medication Sig Dispense Refill  . aspirin 81 MG tablet Take 81 mg by mouth daily.      . carvedilol (COREG) 6.25 MG tablet Take 9.375 mg by mouth 2 (two) times daily with a meal.      . fluticasone (FLONASE) 50 MCG/ACT nasal spray Place 1 spray into the nose daily.       . furosemide (LASIX) 40 MG tablet Take 1 tablet (40 mg total) by mouth 2 (two) times daily.  60 tablet  6  . glimepiride (AMARYL) 4 MG tablet Take 4 mg by mouth daily before breakfast.      . linagliptin (TRADJENTA) 5 MG TABS tablet Take 5 mg by mouth daily.      Marland Kitchen lisinopril (PRINIVIL,ZESTRIL) 20 MG tablet Take 1 tablet (20 mg total) by mouth daily.  30 tablet  6  . metFORMIN (GLUCOPHAGE) 1000 MG tablet Take 1,000 mg by mouth 2 (two) times daily with a meal.      . potassium chloride SA  (K-DUR,KLOR-CON) 20 MEQ tablet Take 1 tablet (20 mEq total) by mouth daily.  30 tablet  3  . simvastatin (ZOCOR) 20 MG tablet Take 20 mg by mouth every evening.      Marland Kitchen spironolactone (ALDACTONE) 25 MG tablet Take 1 tablet (25 mg total) by mouth daily.  30 tablet  6   No current facility-administered medications for this visit.    Past Medical History  Diagnosis Date  . Type 2 diabetes mellitus   . Essential hypertension, benign   . Hypercholesteremia   . Colonic adenoma 2006  . Chronic systolic CHF (congestive heart failure)     a. 03/2013 cho: EF 20-25%, diff HK, sev HK of basal-mid inf, mid-dist inflat and dist antlat walls. Gr 1 DD, mild MR.  . Nonischemic cardiomyopathy     a. 03/2013 Cath: LM nl, LAD nl, D1/2/3 small, nl, LCX nondom, nl, OM1/2/3 nl, RCA dom, nl, EF 15%, mild MR.  Marland Kitchen Hypertensive heart disease   . Alcohol dependency   . Obesity     Social History Corey Murphy reports that he quit smoking about 4 years ago. His smoking use included Cigarettes. He has a 12 pack-year smoking history. He does not have any smokeless tobacco history on file. Corey Murphy reports that he drinks alcohol.  Review of Systems No orthopnea or PND. Negative except as outlined.  Physical Examination Filed Vitals:   03/31/13 1126  BP: 108/72  Pulse: 97   Filed Weights   03/31/13 1126  Weight: 210 lb (95.255 kg)   Patient comfortable at rest. HEENT: Conjunctiva and lids normal, oropharynx clear. Neck: Supple, no elevated JVP or carotid bruits, no thyromegaly. Lungs: Clear to auscultation, nonlabored breathing at rest. Cardiac: Regular rate and rhythm, no S3, no pericardial rub. Abdomen: Soft, nontender, bowel sounds present, no guarding or rebound. Extremities: No pitting edema, distal pulses 2+. Skin: Warm and dry. Musculoskeletal: No kyphosis. Neuropsychiatric: Alert and oriented x3, affect grossly appropriate.   Problem List and Plan   Nonischemic cardiomyopathy LVEF  approximately 20%, clinically improved on medical therapy. He is wearing a LifeVest for now, we will followup echocardiogram over the next few months. Increase Coreg to 9.375 mg twice daily. Followup arranged with BMET.  Alcohol dependency Patient states that he has cut out alcohol completely.  Essential hypertension, benign Blood pressure well controlled at this time.  Type 2 diabetes mellitus Keep followup with Corey Murphy.    Jonelle Sidle, M.D., F.A.C.C.

## 2013-04-08 LAB — BASIC METABOLIC PANEL
BUN: 39 mg/dL — ABNORMAL HIGH (ref 6–23)
Chloride: 99 mEq/L (ref 96–112)
Creat: 1.86 mg/dL — ABNORMAL HIGH (ref 0.50–1.35)
Potassium: 5 mEq/L (ref 3.5–5.3)

## 2013-04-09 ENCOUNTER — Encounter: Payer: Self-pay | Admitting: *Deleted

## 2013-04-09 NOTE — Addendum Note (Signed)
Addended by: Derry Lory A on: 04/09/2013 12:05 PM   Modules accepted: Orders

## 2013-04-10 ENCOUNTER — Encounter (HOSPITAL_COMMUNITY)
Admission: RE | Admit: 2013-04-10 | Discharge: 2013-04-10 | Disposition: A | Discharge status: Home / Self Care | Payer: BC Managed Care – PPO | Source: Ambulatory Visit | Attending: Cardiology | Admitting: Cardiology

## 2013-04-10 VITALS — BP 100/70 | HR 80 | Ht 67.0 in | Wt 209.5 lb

## 2013-04-10 DIAGNOSIS — Z5189 Encounter for other specified aftercare: Secondary | ICD-10-CM | POA: Insufficient documentation

## 2013-04-10 DIAGNOSIS — I428 Other cardiomyopathies: Secondary | ICD-10-CM | POA: Insufficient documentation

## 2013-04-10 DIAGNOSIS — I509 Heart failure, unspecified: Secondary | ICD-10-CM | POA: Insufficient documentation

## 2013-04-10 NOTE — Patient Instructions (Signed)
Pt has finished orientation and is scheduled to start CR on 04/14/13 at 11 am. Pt has been instructed to arrive to class 15 minutes early for scheduled class. Pt has been instructed to wear comfortable clothing and shoes with rubber soles. Pt has been told to take their medications 1 hour prior to coming to class.  If the patient is not going to attend class, he/she has been instructed to call.

## 2013-04-10 NOTE — Progress Notes (Addendum)
Patient was referred to Cardiac Rehab by Dr. Patty Sermons due to CHF 428.2 and cardiomyopathy 425.4. During orientation advised patient on arrival and appointment times what to wear, what to do before, during and after exercise. Reviewed attendance and class policy. Talked about inclement weather and class consultation policy. Pt is scheduled to start Cardiac Rehab on 04/14/13 at 11 am. Pt was advised to come to class 5 minutes before class starts. He was also given instructions on meeting with the dietician and attending the Family Structure classes. Pt is eager to get started. Patient was able to perform the 6 minute walk test.

## 2013-04-13 ENCOUNTER — Emergency Department (HOSPITAL_COMMUNITY): Payer: BC Managed Care – PPO

## 2013-04-13 ENCOUNTER — Emergency Department (HOSPITAL_COMMUNITY)
Admission: EM | Admit: 2013-04-13 | Discharge: 2013-04-13 | Disposition: A | Discharge status: Home / Self Care | Payer: BC Managed Care – PPO | Attending: Emergency Medicine | Admitting: Emergency Medicine

## 2013-04-13 ENCOUNTER — Encounter (HOSPITAL_COMMUNITY): Payer: Self-pay | Admitting: Emergency Medicine

## 2013-04-13 DIAGNOSIS — R42 Dizziness and giddiness: Secondary | ICD-10-CM | POA: Insufficient documentation

## 2013-04-13 DIAGNOSIS — E7800 Pure hypercholesterolemia, unspecified: Secondary | ICD-10-CM | POA: Insufficient documentation

## 2013-04-13 DIAGNOSIS — I5022 Chronic systolic (congestive) heart failure: Secondary | ICD-10-CM | POA: Insufficient documentation

## 2013-04-13 DIAGNOSIS — E669 Obesity, unspecified: Secondary | ICD-10-CM | POA: Insufficient documentation

## 2013-04-13 DIAGNOSIS — E119 Type 2 diabetes mellitus without complications: Secondary | ICD-10-CM | POA: Insufficient documentation

## 2013-04-13 DIAGNOSIS — I110 Hypertensive heart disease with heart failure: Secondary | ICD-10-CM | POA: Insufficient documentation

## 2013-04-13 DIAGNOSIS — R5381 Other malaise: Secondary | ICD-10-CM | POA: Insufficient documentation

## 2013-04-13 DIAGNOSIS — R531 Weakness: Secondary | ICD-10-CM

## 2013-04-13 DIAGNOSIS — Z79899 Other long term (current) drug therapy: Secondary | ICD-10-CM | POA: Insufficient documentation

## 2013-04-13 DIAGNOSIS — Z7982 Long term (current) use of aspirin: Secondary | ICD-10-CM | POA: Insufficient documentation

## 2013-04-13 DIAGNOSIS — Z8719 Personal history of other diseases of the digestive system: Secondary | ICD-10-CM | POA: Insufficient documentation

## 2013-04-13 DIAGNOSIS — R209 Unspecified disturbances of skin sensation: Secondary | ICD-10-CM | POA: Insufficient documentation

## 2013-04-13 DIAGNOSIS — Z87891 Personal history of nicotine dependence: Secondary | ICD-10-CM | POA: Insufficient documentation

## 2013-04-13 DIAGNOSIS — IMO0002 Reserved for concepts with insufficient information to code with codable children: Secondary | ICD-10-CM | POA: Insufficient documentation

## 2013-04-13 LAB — CBC
HCT: 39.5 % (ref 39.0–52.0)
MCH: 32 pg (ref 26.0–34.0)
MCV: 94.3 fL (ref 78.0–100.0)
RBC: 4.19 MIL/uL — ABNORMAL LOW (ref 4.22–5.81)
RDW: 12.8 % (ref 11.5–15.5)
WBC: 7.4 10*3/uL (ref 4.0–10.5)

## 2013-04-13 LAB — BASIC METABOLIC PANEL
BUN: 31 mg/dL — ABNORMAL HIGH (ref 6–23)
CO2: 28 mEq/L (ref 19–32)
Chloride: 99 mEq/L (ref 96–112)
Creatinine, Ser: 1.53 mg/dL — ABNORMAL HIGH (ref 0.50–1.35)
GFR calc Af Amer: 55 mL/min — ABNORMAL LOW (ref >90)
Glucose, Bld: 112 mg/dL — ABNORMAL HIGH (ref 70–99)
Potassium: 4 mEq/L (ref 3.5–5.1)

## 2013-04-13 LAB — PRO B NATRIURETIC PEPTIDE: Pro B Natriuretic peptide (BNP): 248.3 pg/mL — ABNORMAL HIGH (ref 0–125)

## 2013-04-13 LAB — POCT I-STAT TROPONIN I

## 2013-04-13 NOTE — ED Notes (Signed)
MD at bedside. 

## 2013-04-13 NOTE — ED Notes (Addendum)
From home: recent hx. Of chf; wearing a external defib. Pt. Having "activity" with wife. About 45 minutes afterwards when going to sleep, pt. Started having ringing in ears, dizziness, denies cp and sob. According to patient, "having[sex]  activity with wife."  Physical therapists told pt. It was ok.  Pt. Wearing an external defibrillator b/c high risk for cardiac arrest r/t chf.

## 2013-04-13 NOTE — ED Provider Notes (Signed)
CSN: [id number]     Arrival date & time 04/13/13  0247 History   First MD Initiated Contact with Patient 04/13/13 0402     Chief Complaint  Patient presents with  . Dizziness  . Weakness    HPI Patient states he is having a normal day today.  He and his wife have intercourse at approximately 45 minutes after intercourse the patient developed some reading in his ears and then developed some numbness and tingling in his bilateral fingertips.  He had no chest pain or shortness of breath.  He denies any nausea vomiting or diarrhea.  He states his symptoms lasted for approximately 30 minutes and now he is asymptomatic.  He feels great at this time.  He does have a history of nonischemic cardiomyopathy and is currently wearing a zoll life vest.  He's been doing well since his recent hospitalization.  He's been compliant with his medications.  No headache or change in his vision.  No unilateral arm or leg weakness.  No other complaints.  His wife checked his blood pressure during this event his blood pressure is 155/93   Past Medical History  Diagnosis Date  . Type 2 diabetes mellitus   . Essential hypertension, benign   . Hypercholesteremia   . Colonic adenoma 2006  . Chronic systolic CHF (congestive heart failure)     a. 03/2013 cho: EF 20-25%, diff HK, sev HK of basal-mid inf, mid-dist inflat and dist antlat walls. Gr 1 DD, mild MR.  . Nonischemic cardiomyopathy     a. 03/2013 Cath: LM nl, LAD nl, D1/2/3 small, nl, LCX nondom, nl, OM1/2/3 nl, RCA dom, nl, EF 15%, mild MR.  Marland Kitchen Hypertensive heart disease   . Alcohol dependency   . Obesity    Past Surgical History  Procedure Laterality Date  . Colonoscopy  05/20/2004    RMR: Internal hemorrhoids.  Diminutive adenomatous polyp in the mid-right colon, otherwise normal  . Ileocolonoscopy  06/28/2007    NWG:NFAOZH rectum/Submucosal sigmoid diverticula (chronic), doubt clinical significance Hyperplastic polypoid-appearing fold, mid ascending  colon, status/Remainder colonic mucosa and terminal ileum mucosa appeared normal   . Total hip arthroplasty  2010    Left  . Total hip arthroplasty  2010    Right  . Colonoscopy N/A 06/19/2012    Procedure: COLONOSCOPY;  Surgeon: Corbin Ade, MD;  Location: [facility];  Service: Endoscopy;  Laterality: N/A;  8:30AM-rescheduled 10:30am Leigh Ann notified pt   Family History  Problem Relation Age of Onset  . Colon cancer Neg Hx   . Heart failure Mother   . Diabetes Mother   . Hypertension Mother   . Heart failure Sister   . Hypertension Sister   . Cancer Sister    History  Substance Use Topics  . Smoking status: Former Smoker -- 1.00 packs/day for 12 years    Types: Cigarettes    Quit date: 05/28/2008  . Smokeless tobacco: Not on file  . Alcohol Use: Yes     Comment: 12-14 beers on weekends only    Review of Systems  All other systems reviewed and are negative.    Allergies  Review of patient's allergies indicates no known allergies.  Home Medications   Current Outpatient Rx  Name  Route  Sig  Dispense  Refill  . aspirin 81 MG tablet   Oral   Take 81 mg by mouth daily.         . carvedilol (COREG) 6.25 MG tablet  Oral   Take 9.375 mg by mouth 2 (two) times daily with a meal.         . fluticasone (FLONASE) 50 MCG/ACT nasal spray   Nasal   Place 1 spray into the nose daily.          . furosemide (LASIX) 40 MG tablet   Oral   Take 40 mg by mouth daily.         Marland Kitchen glimepiride (AMARYL) 4 MG tablet   Oral   Take 4 mg by mouth daily before breakfast.         . linagliptin (TRADJENTA) 5 MG TABS tablet   Oral   Take 5 mg by mouth daily.         Marland Kitchen lisinopril (PRINIVIL,ZESTRIL) 20 MG tablet   Oral   Take 1 tablet (20 mg total) by mouth daily.   30 tablet   6   . metFORMIN (GLUCOPHAGE) 1000 MG tablet   Oral   Take 1,000 mg by mouth 2 (two) times daily with a meal.         . potassium chloride SA (K-DUR,KLOR-CON) 20 MEQ tablet   Oral    Take 10 mEq by mouth daily.         . simvastatin (ZOCOR) 20 MG tablet   Oral   Take 20 mg by mouth every evening.         Marland Kitchen spironolactone (ALDACTONE) 25 MG tablet   Oral   Take 1 tablet (25 mg total) by mouth daily.   30 tablet   6    BP 134/94  Pulse 96  Temp(Src) 97.7 F (36.5 C) (Oral)  Resp 16  SpO2 97% Physical Exam  Nursing note and vitals reviewed. Constitutional: He is oriented to person, place, and time. He appears well-developed and well-nourished.  HENT:  Head: Normocephalic and atraumatic.  Eyes: EOM are normal.  Neck: Normal range of motion.  Cardiovascular: Normal rate, regular rhythm, normal heart sounds and intact distal pulses.   Pulmonary/Chest: Effort normal and breath sounds normal. No respiratory distress.  Abdominal: Soft. He exhibits no distension. There is no tenderness.  Musculoskeletal: Normal range of motion.  Neurological: He is alert and oriented to person, place, and time.  Skin: Skin is warm and dry.  Psychiatric: He has a normal mood and affect. Judgment normal.    ED Course  Procedures (including critical care time) Labs Review Labs Reviewed  CBC - Abnormal; Notable for the following:    RBC 4.19 (*)    All other components within normal limits  PRO B NATRIURETIC PEPTIDE - Abnormal; Notable for the following:    Pro B Natriuretic peptide (BNP) 248.3 (*)    All other components within normal limits  BASIC METABOLIC PANEL - Abnormal; Notable for the following:    Glucose, Bld 112 (*)    BUN 31 (*)    Creatinine, Ser 1.53 (*)    GFR calc non Af Amer 48 (*)    GFR calc Af Amer 55 (*)    All other components within normal limits  POCT I-STAT TROPONIN I   Imaging Review Dg Chest 2 View  04/13/2013   CLINICAL DATA:  Dizziness and weakness.  EXAM: CHEST  2 VIEW  COMPARISON:  Chest radiograph performed 03/21/2013  FINDINGS: The lungs are well-aerated and clear. There is no evidence of focal opacification, pleural effusion or  pneumothorax.  The heart is normal in size; the mediastinal contour is within normal limits. No  acute osseous abnormalities are seen.  IMPRESSION: No acute cardiopulmonary process seen.   Electronically Signed   By: Roanna Raider M.D.   On: 04/13/2013 04:06  I personally reviewed the imaging tests through PACS system I reviewed available ER/hospitalization records through the EMR   EKG Interpretation   None       MDM   1. Weakness    Transient symptoms of numbness and tingling in his bilateral fingertips and ringing in his ears.  This was transient and resolved on its own.  No initiation of additional workup in the emergency apartment.  Basic labs obtained via nursing protocol without significant abnormalities here in the ER.  Discharge home with PCP followup.  He understands return to the ER for new or worsening symptoms.  I suspect given the patient's recent hospitalization and developing ischemic cardiomyopathy that he is hypersensitive to his body at this time.   Lyanne Co, MD 04/13/13 [phone]

## 2013-04-14 ENCOUNTER — Encounter (HOSPITAL_COMMUNITY)
Admission: RE | Admit: 2013-04-14 | Discharge: 2013-04-14 | Disposition: A | Discharge status: Home / Self Care | Payer: BC Managed Care – PPO | Source: Ambulatory Visit | Attending: Cardiology | Admitting: Cardiology

## 2013-04-16 ENCOUNTER — Encounter (HOSPITAL_COMMUNITY)
Admission: RE | Admit: 2013-04-16 | Discharge: 2013-04-16 | Disposition: A | Discharge status: Home / Self Care | Payer: BC Managed Care – PPO | Source: Ambulatory Visit | Attending: Cardiology | Admitting: Cardiology

## 2013-04-18 ENCOUNTER — Encounter (HOSPITAL_COMMUNITY)
Admission: RE | Admit: 2013-04-18 | Discharge: 2013-04-18 | Disposition: A | Discharge status: Home / Self Care | Payer: BC Managed Care – PPO | Source: Ambulatory Visit | Attending: Cardiology | Admitting: Cardiology

## 2013-04-21 ENCOUNTER — Encounter (HOSPITAL_COMMUNITY)
Admission: RE | Admit: 2013-04-21 | Discharge: 2013-04-21 | Disposition: A | Discharge status: Home / Self Care | Payer: BC Managed Care – PPO | Source: Ambulatory Visit | Attending: Cardiology | Admitting: Cardiology

## 2013-04-23 ENCOUNTER — Encounter (HOSPITAL_COMMUNITY)
Admission: RE | Admit: 2013-04-23 | Discharge: 2013-04-23 | Disposition: A | Discharge status: Home / Self Care | Payer: BC Managed Care – PPO | Source: Ambulatory Visit | Attending: Cardiology | Admitting: Cardiology

## 2013-04-25 ENCOUNTER — Encounter (HOSPITAL_COMMUNITY): Payer: BC Managed Care – PPO

## 2013-04-28 ENCOUNTER — Encounter (HOSPITAL_COMMUNITY)
Admission: RE | Admit: 2013-04-28 | Discharge: 2013-04-28 | Disposition: A | Discharge status: Home / Self Care | Payer: BC Managed Care – PPO | Source: Ambulatory Visit | Attending: Cardiology | Admitting: Cardiology

## 2013-04-30 ENCOUNTER — Encounter (HOSPITAL_COMMUNITY)
Admission: RE | Admit: 2013-04-30 | Discharge: 2013-04-30 | Disposition: A | Discharge status: Home / Self Care | Payer: BC Managed Care – PPO | Source: Ambulatory Visit | Attending: Cardiology | Admitting: Cardiology

## 2013-04-30 LAB — BASIC METABOLIC PANEL
BUN: 28 mg/dL — ABNORMAL HIGH (ref 6–23)
CO2: 29 mEq/L (ref 19–32)
Chloride: 100 mEq/L (ref 96–112)
Creat: 1.51 mg/dL — ABNORMAL HIGH (ref 0.50–1.35)
Glucose, Bld: 52 mg/dL — ABNORMAL LOW (ref 70–99)
Potassium: 5.1 mEq/L (ref 3.5–5.3)
Sodium: 138 mEq/L (ref 135–145)

## 2013-05-02 ENCOUNTER — Encounter (HOSPITAL_COMMUNITY)
Admission: RE | Admit: 2013-05-02 | Discharge: 2013-05-02 | Disposition: A | Discharge status: Home / Self Care | Payer: BC Managed Care – PPO | Source: Ambulatory Visit | Attending: Cardiology | Admitting: Cardiology

## 2013-05-02 DIAGNOSIS — I509 Heart failure, unspecified: Secondary | ICD-10-CM | POA: Insufficient documentation

## 2013-05-02 DIAGNOSIS — Z5189 Encounter for other specified aftercare: Secondary | ICD-10-CM | POA: Insufficient documentation

## 2013-05-02 DIAGNOSIS — I428 Other cardiomyopathies: Secondary | ICD-10-CM | POA: Insufficient documentation

## 2013-05-05 ENCOUNTER — Other Ambulatory Visit: Payer: Self-pay | Admitting: *Deleted

## 2013-05-05 ENCOUNTER — Encounter (HOSPITAL_COMMUNITY)
Admission: RE | Admit: 2013-05-05 | Discharge: 2013-05-05 | Disposition: A | Discharge status: Home / Self Care | Payer: BC Managed Care – PPO | Source: Ambulatory Visit | Attending: Cardiology | Admitting: Cardiology

## 2013-05-05 MED ORDER — SPIRONOLACTONE 25 MG PO TABS: 12.5000 mg | ORAL_TABLET | Freq: Every day | ORAL | Status: DC

## 2013-05-07 ENCOUNTER — Ambulatory Visit (INDEPENDENT_AMBULATORY_CARE_PROVIDER_SITE_OTHER): Payer: BC Managed Care – PPO | Admitting: Cardiology

## 2013-05-07 ENCOUNTER — Encounter: Payer: Self-pay | Admitting: Cardiology

## 2013-05-07 ENCOUNTER — Encounter (HOSPITAL_COMMUNITY): Payer: BC Managed Care – PPO

## 2013-05-07 VITALS — BP 104/69 | HR 86 | Ht 67.0 in | Wt 206.0 lb

## 2013-05-07 DIAGNOSIS — I10 Essential (primary) hypertension: Secondary | ICD-10-CM

## 2013-05-07 DIAGNOSIS — I429 Cardiomyopathy, unspecified: Secondary | ICD-10-CM

## 2013-05-07 DIAGNOSIS — I428 Other cardiomyopathies: Secondary | ICD-10-CM

## 2013-05-07 MED ORDER — CARVEDILOL 12.5 MG PO TABS: 12.5000 mg | ORAL_TABLET | Freq: Two times a day (BID) | ORAL | Status: DC

## 2013-05-07 NOTE — Assessment & Plan Note (Signed)
LVEF approximately 20%, clinically improved on medical therapy. He is wearing a LifeVest for now. Plan to increase Coreg to 12.5 mg twice daily, followup in one month to review BMET and a repeat echocardiogram.

## 2013-05-07 NOTE — Patient Instructions (Signed)
Your physician recommends that you schedule a follow-up appointment in: [place] has recommended you make the following change in your medication:   1) [place] 12.5 TWICE DAILY  Your physician recommends that you return for lab work in: [place] A BMET, YOU DO NOT NEED TO BE FASTING ([place])  Your physician has requested that you have an echocardiogram. Echocardiography is a painless test that uses sound waves to create images of your heart. It provides your doctor with information about the size and shape of your heart and how well your heart's chambers and valves are working. This procedure takes approximately one hour. There are no restrictions for this procedure.IN ONE MONTH  WE WILL CALL YOU WITH YOUR TEST RESULTS/INSTRUCTIONS/NEXT STEPS ONCE RECEIVED BY THE PROVIDER

## 2013-05-07 NOTE — Progress Notes (Signed)
Clinical Summary Mr. Lastinger is a 63 y.o.male last seen in December 2014. He states that he has been doing well, denies any chest pain or progressive shortness of breath. He continues to wear the LifeVest defibrillator, no shocks.  Lab work from December 2014 showed potassium 5.1, BUN 28, creatinine 1.5. Aldactone dose was reduced at that time. Current medical regimen is noted below.  Weight is down 3 pounds, he states that he is working on his diet. No alcohol intake.  We discussed continuing observation on medical therapy, further advancing Coreg, and plan to followup on lab work and an echocardiogram for his next visit.   No Known Allergies  Current Outpatient Prescriptions  Medication Sig Dispense Refill  . aspirin 81 MG tablet Take 81 mg by mouth daily.      . fluticasone (FLONASE) 50 MCG/ACT nasal spray Place 1 spray into the nose daily.       . furosemide (LASIX) 40 MG tablet Take 40 mg by mouth daily.      Marland Kitchen glimepiride (AMARYL) 4 MG tablet Take 4 mg by mouth daily before breakfast.      . linagliptin (TRADJENTA) 5 MG TABS tablet Take 5 mg by mouth daily.      Marland Kitchen lisinopril (PRINIVIL,ZESTRIL) 20 MG tablet Take 1 tablet (20 mg total) by mouth daily.  30 tablet  6  . metFORMIN (GLUCOPHAGE) 1000 MG tablet Take 1,000 mg by mouth 2 (two) times daily with a meal.      . potassium chloride SA (K-DUR,KLOR-CON) 20 MEQ tablet Take 10 mEq by mouth daily.      . simvastatin (ZOCOR) 20 MG tablet Take 20 mg by mouth every evening.      Marland Kitchen spironolactone (ALDACTONE) 25 MG tablet Take 0.5 tablets (12.5 mg total) by mouth daily.  30 tablet  6  . carvedilol (COREG) 12.5 MG tablet Take 1 tablet (12.5 mg total) by mouth 2 (two) times daily.  60 tablet  6   No current facility-administered medications for this visit.    Past Medical History  Diagnosis Date  . Type 2 diabetes mellitus   . Essential hypertension, benign   . Hypercholesteremia   . Colonic adenoma 2006  . Chronic systolic CHF  (congestive heart failure)     a. 03/2013 cho: EF 20-25%, diff HK, sev HK of basal-mid inf, mid-dist inflat and dist antlat walls. Gr 1 DD, mild MR.  . Nonischemic cardiomyopathy     a. 03/2013 Cath: LM nl, LAD nl, D1/2/3 small, nl, LCX nondom, nl, OM1/2/3 nl, RCA dom, nl, EF 15%, mild MR.  Marland Kitchen Hypertensive heart disease   . Alcohol dependency   . Obesity     Social History Mr. Yoakum reports that he quit smoking about 4 years ago. His smoking use included Cigarettes. He has a 12 pack-year smoking history. He does not have any smokeless tobacco history on file. Mr. Alipio reports that he drinks alcohol.  Review of Systems Negative except as outlined.  Physical Examination Filed Vitals:   05/07/13 1038  BP: 104/69  Pulse: 86   Filed Weights   05/07/13 1038  Weight: 206 lb (93.441 kg)    Patient comfortable at rest.  HEENT: Conjunctiva and lids normal, oropharynx clear.  Neck: Supple, no elevated JVP or carotid bruits, no thyromegaly.  Lungs: Clear to auscultation, nonlabored breathing at rest.  Cardiac: Regular rate and rhythm, no S3, no pericardial rub.  Abdomen: Soft, nontender, bowel sounds present, no guarding or rebound.  Extremities: No pitting edema, distal pulses 2+.  Skin: Warm and dry.  Musculoskeletal: No kyphosis.  Neuropsychiatric: Alert and oriented x3, affect grossly appropriate.   Problem List and Plan   Nonischemic cardiomyopathy LVEF approximately 20%, clinically improved on medical therapy. He is wearing a LifeVest for now. Plan to increase Coreg to 12.5 mg twice daily, followup in one month to review BMET and a repeat echocardiogram.  Essential hypertension, benign Blood pressure is normal today.    Satira Sark, M.D., F.A.C.C.

## 2013-05-07 NOTE — Assessment & Plan Note (Signed)
Blood pressure is normal today. 

## 2013-05-09 ENCOUNTER — Encounter (HOSPITAL_COMMUNITY)
Admission: RE | Admit: 2013-05-09 | Discharge: 2013-05-09 | Disposition: A | Discharge status: Home / Self Care | Payer: BC Managed Care – PPO | Source: Ambulatory Visit | Attending: Cardiology | Admitting: Cardiology

## 2013-05-12 ENCOUNTER — Encounter (HOSPITAL_COMMUNITY)
Admission: RE | Admit: 2013-05-12 | Discharge: 2013-05-12 | Disposition: A | Discharge status: Home / Self Care | Payer: BC Managed Care – PPO | Source: Ambulatory Visit | Attending: Cardiology | Admitting: Cardiology

## 2013-05-14 ENCOUNTER — Encounter (HOSPITAL_COMMUNITY)
Admission: RE | Admit: 2013-05-14 | Discharge: 2013-05-14 | Disposition: A | Discharge status: Home / Self Care | Payer: BC Managed Care – PPO | Source: Ambulatory Visit | Attending: Cardiology | Admitting: Cardiology

## 2013-05-16 ENCOUNTER — Encounter (HOSPITAL_COMMUNITY)
Admission: RE | Admit: 2013-05-16 | Discharge: 2013-05-16 | Disposition: A | Discharge status: Home / Self Care | Payer: BC Managed Care – PPO | Source: Ambulatory Visit | Attending: Cardiology | Admitting: Cardiology

## 2013-05-19 ENCOUNTER — Encounter (HOSPITAL_COMMUNITY)
Admission: RE | Admit: 2013-05-19 | Discharge: 2013-05-19 | Disposition: A | Discharge status: Home / Self Care | Payer: BC Managed Care – PPO | Source: Ambulatory Visit | Attending: Cardiology | Admitting: Cardiology

## 2013-05-21 ENCOUNTER — Encounter (HOSPITAL_COMMUNITY)
Admission: RE | Admit: 2013-05-21 | Discharge: 2013-05-21 | Disposition: A | Discharge status: Home / Self Care | Payer: BC Managed Care – PPO | Source: Ambulatory Visit | Attending: Cardiology | Admitting: Cardiology

## 2013-05-23 ENCOUNTER — Encounter (HOSPITAL_COMMUNITY)
Admission: RE | Admit: 2013-05-23 | Discharge: 2013-05-23 | Disposition: A | Discharge status: Home / Self Care | Payer: BC Managed Care – PPO | Source: Ambulatory Visit | Attending: Cardiology | Admitting: Cardiology

## 2013-05-26 ENCOUNTER — Encounter (HOSPITAL_COMMUNITY)
Admission: RE | Admit: 2013-05-26 | Discharge: 2013-05-26 | Disposition: A | Discharge status: Home / Self Care | Payer: BC Managed Care – PPO | Source: Ambulatory Visit | Attending: Cardiology | Admitting: Cardiology

## 2013-05-28 ENCOUNTER — Encounter (HOSPITAL_COMMUNITY)
Admission: RE | Admit: 2013-05-28 | Discharge: 2013-05-28 | Disposition: A | Discharge status: Home / Self Care | Payer: BC Managed Care – PPO | Source: Ambulatory Visit | Attending: Cardiology | Admitting: Cardiology

## 2013-05-30 ENCOUNTER — Encounter (HOSPITAL_COMMUNITY)
Admission: RE | Admit: 2013-05-30 | Discharge: 2013-05-30 | Disposition: A | Discharge status: Home / Self Care | Payer: BC Managed Care – PPO | Source: Ambulatory Visit | Attending: Cardiology | Admitting: Cardiology

## 2013-06-02 ENCOUNTER — Encounter (HOSPITAL_COMMUNITY)
Admission: RE | Admit: 2013-06-02 | Discharge: 2013-06-02 | Disposition: A | Discharge status: Home / Self Care | Payer: BC Managed Care – PPO | Source: Ambulatory Visit | Attending: Cardiology | Admitting: Cardiology

## 2013-06-02 DIAGNOSIS — I428 Other cardiomyopathies: Secondary | ICD-10-CM | POA: Insufficient documentation

## 2013-06-02 DIAGNOSIS — Z5189 Encounter for other specified aftercare: Secondary | ICD-10-CM | POA: Insufficient documentation

## 2013-06-02 DIAGNOSIS — I509 Heart failure, unspecified: Secondary | ICD-10-CM | POA: Insufficient documentation

## 2013-06-03 ENCOUNTER — Encounter: Payer: Self-pay | Admitting: *Deleted

## 2013-06-03 LAB — BASIC METABOLIC PANEL
BUN: 33 mg/dL — AB (ref 6–23)
CO2: 29 mEq/L (ref 19–32)
CREATININE: 1.55 mg/dL — AB (ref 0.50–1.35)
Calcium: 9.7 mg/dL (ref 8.4–10.5)
Chloride: 103 mEq/L (ref 96–112)
Glucose, Bld: 62 mg/dL — ABNORMAL LOW (ref 70–99)
Potassium: 5.2 mEq/L (ref 3.5–5.3)
Sodium: 139 mEq/L (ref 135–145)

## 2013-06-04 ENCOUNTER — Encounter (HOSPITAL_COMMUNITY)
Admission: RE | Admit: 2013-06-04 | Discharge: 2013-06-04 | Disposition: A | Discharge status: Home / Self Care | Payer: BC Managed Care – PPO | Source: Ambulatory Visit | Attending: Cardiology | Admitting: Cardiology

## 2013-06-04 ENCOUNTER — Ambulatory Visit (HOSPITAL_COMMUNITY)
Admission: RE | Admit: 2013-06-04 | Discharge: 2013-06-04 | Disposition: A | Discharge status: Home / Self Care | Payer: BC Managed Care – PPO | Source: Ambulatory Visit | Attending: Cardiology | Admitting: Cardiology

## 2013-06-04 DIAGNOSIS — I517 Cardiomegaly: Secondary | ICD-10-CM

## 2013-06-04 DIAGNOSIS — F1010 Alcohol abuse, uncomplicated: Secondary | ICD-10-CM | POA: Insufficient documentation

## 2013-06-04 DIAGNOSIS — I509 Heart failure, unspecified: Secondary | ICD-10-CM | POA: Insufficient documentation

## 2013-06-04 DIAGNOSIS — I10 Essential (primary) hypertension: Secondary | ICD-10-CM | POA: Insufficient documentation

## 2013-06-04 DIAGNOSIS — I429 Cardiomyopathy, unspecified: Secondary | ICD-10-CM

## 2013-06-04 DIAGNOSIS — E669 Obesity, unspecified: Secondary | ICD-10-CM | POA: Insufficient documentation

## 2013-06-04 DIAGNOSIS — Z6832 Body mass index (BMI) 32.0-32.9, adult: Secondary | ICD-10-CM | POA: Insufficient documentation

## 2013-06-04 DIAGNOSIS — I428 Other cardiomyopathies: Secondary | ICD-10-CM | POA: Insufficient documentation

## 2013-06-04 DIAGNOSIS — E119 Type 2 diabetes mellitus without complications: Secondary | ICD-10-CM | POA: Insufficient documentation

## 2013-06-04 DIAGNOSIS — E785 Hyperlipidemia, unspecified: Secondary | ICD-10-CM | POA: Insufficient documentation

## 2013-06-04 DIAGNOSIS — Z87891 Personal history of nicotine dependence: Secondary | ICD-10-CM | POA: Insufficient documentation

## 2013-06-04 NOTE — Progress Notes (Signed)
*  PRELIMINARY RESULTS* Echocardiogram 2D Echocardiogram has been performed.  Hughes, [place] 06/04/2013, 10:06 AM

## 2013-06-05 ENCOUNTER — Encounter: Payer: Self-pay | Admitting: *Deleted

## 2013-06-06 ENCOUNTER — Encounter: Payer: Self-pay | Admitting: Cardiology

## 2013-06-06 ENCOUNTER — Encounter (INDEPENDENT_AMBULATORY_CARE_PROVIDER_SITE_OTHER): Payer: Self-pay

## 2013-06-06 ENCOUNTER — Ambulatory Visit (INDEPENDENT_AMBULATORY_CARE_PROVIDER_SITE_OTHER): Payer: BC Managed Care – PPO | Admitting: Cardiology

## 2013-06-06 ENCOUNTER — Encounter (HOSPITAL_COMMUNITY)
Admission: RE | Admit: 2013-06-06 | Discharge: 2013-06-06 | Disposition: A | Discharge status: Home / Self Care | Payer: BC Managed Care – PPO | Source: Ambulatory Visit | Attending: Cardiology | Admitting: Cardiology

## 2013-06-06 VITALS — BP 102/56 | HR 80 | Ht 67.0 in | Wt 195.0 lb

## 2013-06-06 DIAGNOSIS — N183 Chronic kidney disease, stage 3 unspecified: Secondary | ICD-10-CM

## 2013-06-06 DIAGNOSIS — I428 Other cardiomyopathies: Secondary | ICD-10-CM

## 2013-06-06 DIAGNOSIS — N189 Chronic kidney disease, unspecified: Secondary | ICD-10-CM | POA: Insufficient documentation

## 2013-06-06 DIAGNOSIS — I10 Essential (primary) hypertension: Secondary | ICD-10-CM

## 2013-06-06 NOTE — Assessment & Plan Note (Signed)
Blood pressure low normal. Occasionally feels somewhat dizzy when he stands. Will not further uptitrate medical therapy.

## 2013-06-06 NOTE — Progress Notes (Signed)
Clinical Summary [name] is a 63 y.o.male last seen in January 2015. Medical therapy was adjusted at that time he was referred ultimately for followup echocardiogram.  Echocardiogram done recently in February showed moderate LVH with LVEF approximately 03%, grade 1 diastolic dysfunction, mild left atrial enlargement, lipomatous hypertrophy of the atrial septum. Although not normalized, LVEF has improved compared to initial testing on medical therapy. LifeVest has been discontinued.  Followup lab work showed potassium 5.2, BUN 33, creatinine 1.5 (stable).  He is here with his wife today. States that he feels fairly well. Has some hyperpigmented areas on his skin where the LifeVest was attached. He has been putting some steroid cream on it. No chest pain, palpitations, or syncope. Currently with NYHA class 1-2 dyspnea. He still is exercising in cardiac rehabilitation, also uses a stationary bicycle at home.  No Known Allergies  Current Outpatient Prescriptions  Medication Sig Dispense Refill  . aspirin 81 MG tablet Take 81 mg by mouth daily.      . carvedilol (COREG) 12.5 MG tablet Take 1 tablet (12.5 mg total) by mouth 2 (two) times daily.  60 tablet  6  . fluticasone (FLONASE) 50 MCG/ACT nasal spray Place 1 spray into the nose daily.       . furosemide (LASIX) 40 MG tablet Take 40 mg by mouth daily.      Marland Kitchen glimepiride (AMARYL) 4 MG tablet Take 4 mg by mouth daily before breakfast.      . linagliptin (TRADJENTA) 5 MG TABS tablet Take 5 mg by mouth daily.      Marland Kitchen lisinopril (PRINIVIL,ZESTRIL) 20 MG tablet Take 1 tablet (20 mg total) by mouth daily.  30 tablet  6  . metFORMIN (GLUCOPHAGE) 1000 MG tablet Take 1,000 mg by mouth 2 (two) times daily with a meal.      . simvastatin (ZOCOR) 20 MG tablet Take 20 mg by mouth every evening.      Marland Kitchen spironolactone (ALDACTONE) 25 MG tablet Take 0.5 tablets (12.5 mg total) by mouth daily.  30 tablet  6   No current facility-administered medications  for this visit.    Past Medical History  Diagnosis Date  . Type 2 diabetes mellitus   . Essential hypertension, benign   . Hypercholesteremia   . Colonic adenoma 2006  . Chronic systolic CHF (congestive heart failure)     a. 03/2013 cho: EF 20-25%, diff HK, sev HK of basal-mid inf, mid-dist inflat and dist antlat walls. Gr 1 DD, mild MR.  . Nonischemic cardiomyopathy     a. 03/2013 Cath: LM nl, LAD nl, D1/2/3 small, nl, LCX nondom, nl, OM1/2/3 nl, RCA dom, nl, EF 15%, mild MR.  Marland Kitchen Hypertensive heart disease   . Alcohol dependency   . Obesity     Social History Mr. Nagy reports that he quit smoking about 5 years ago. His smoking use included Cigarettes. He has a 12 pack-year smoking history. He does not have any smokeless tobacco history on file. Mr. Magallanes reports that he drinks alcohol.  Review of Systems Negative except as outlined.  Physical Examination Filed Vitals:   06/06/13 1258  BP: 102/56  Pulse: 80   Filed Weights   06/06/13 1258  Weight: 195 lb (88.451 kg)    Patient comfortable at rest.  HEENT: Conjunctiva and lids normal, oropharynx clear.  Neck: Supple, no elevated JVP or carotid bruits, no thyromegaly.  Lungs: Clear to auscultation, nonlabored breathing at rest.  Thorax: Hyperpigmented areas where LifeVest  patches were. Cardiac: Regular rate and rhythm, no S3, no pericardial rub.  Abdomen: Soft, nontender, bowel sounds present, no guarding or rebound.  Extremities: No pitting edema, distal pulses 2+.  Skin: Warm and dry.  Musculoskeletal: No kyphosis.  Neuropsychiatric: Alert and oriented x3, affect grossly appropriate.   Problem List and Plan   Nonischemic cardiomyopathy LVEF approximately 40% by followup echocardiogram. Plan to continue medical therapy, no change to current doses except we will stop potassium. Recheck BMET in 3 months with clinical visit. Continue with regular exercise.  Essential hypertension, benign Blood pressure low normal.  Occasionally feels somewhat dizzy when he stands. Will not further uptitrate medical therapy.  CKD (chronic kidney disease) stage 3, GFR 30-59 ml/min Creatinine stable at 1.5.    Satira Sark, M.D., F.A.C.C.

## 2013-06-06 NOTE — Assessment & Plan Note (Signed)
Creatinine stable at 1.5. 

## 2013-06-06 NOTE — Assessment & Plan Note (Signed)
LVEF approximately 40% by followup echocardiogram. Plan to continue medical therapy, no change to current doses except we will stop potassium. Recheck BMET in 3 months with clinical visit. Continue with regular exercise.

## 2013-06-06 NOTE — Patient Instructions (Addendum)
Your physician recommends that you schedule a follow-up appointment in: 3 months   Your physician has recommended you make the following change in your medication:    STOP Potassium   Please have blood work done one week before your next visit with Dr.McDowell   Thanks for choosing [facility] !

## 2013-06-09 ENCOUNTER — Encounter (HOSPITAL_COMMUNITY)
Admission: RE | Admit: 2013-06-09 | Discharge: 2013-06-09 | Disposition: A | Discharge status: Home / Self Care | Payer: BC Managed Care – PPO | Source: Ambulatory Visit | Attending: Cardiology | Admitting: Cardiology

## 2013-06-11 ENCOUNTER — Encounter (HOSPITAL_COMMUNITY)
Admission: RE | Admit: 2013-06-11 | Discharge: 2013-06-11 | Disposition: A | Discharge status: Home / Self Care | Payer: BC Managed Care – PPO | Source: Ambulatory Visit | Attending: Cardiology | Admitting: Cardiology

## 2013-06-13 ENCOUNTER — Encounter (HOSPITAL_COMMUNITY)
Admission: RE | Admit: 2013-06-13 | Discharge: 2013-06-13 | Disposition: A | Discharge status: Home / Self Care | Payer: BC Managed Care – PPO | Source: Ambulatory Visit | Attending: Cardiology | Admitting: Cardiology

## 2013-06-16 ENCOUNTER — Encounter (HOSPITAL_COMMUNITY)
Admission: RE | Admit: 2013-06-16 | Discharge: 2013-06-16 | Disposition: A | Discharge status: Home / Self Care | Payer: BC Managed Care – PPO | Source: Ambulatory Visit | Attending: Cardiology | Admitting: Cardiology

## 2013-06-18 ENCOUNTER — Encounter (HOSPITAL_COMMUNITY)
Admission: RE | Admit: 2013-06-18 | Discharge: 2013-06-18 | Disposition: A | Discharge status: Home / Self Care | Payer: BC Managed Care – PPO | Source: Ambulatory Visit | Attending: Cardiology | Admitting: Cardiology

## 2013-06-20 ENCOUNTER — Encounter (HOSPITAL_COMMUNITY)
Admission: RE | Admit: 2013-06-20 | Discharge: 2013-06-20 | Disposition: A | Discharge status: Home / Self Care | Payer: BC Managed Care – PPO | Source: Ambulatory Visit | Attending: Cardiology | Admitting: Cardiology

## 2013-06-20 NOTE — Addendum Note (Signed)
Encounter addended by: Norlene Duel, RN on: 06/20/2013  2:36 PM<BR>     Documentation filed: Notes Section

## 2013-06-20 NOTE — Progress Notes (Signed)
Cardiac Rehabilitation Program Outcomes Report   Orientation:  04/10/2013 1 st week Report: 04/18/2013 Graduate Date:  tbd Discharge Date:  tbd # of sessions completed: 3 DX: CHF and Nonischemic cardiomyopathy  Cardiologist: Grant Ruts MD:  Orson Ape Class Time:  11;00  A.  Exercise Program:  Tolerates exercise @ 3.63 METS for 15 minutes and Walk Test Results:  Pre: Pre walk Test: rest HR 80, BP 100/70, O2 98%, RPE 9 and RPD 9, 6 minuteHR 94, BP 142/90, O2 98%, RPE 11 and RPD 11, Post HR 77, BP 92/62, O2 100% RPE 9 and RPD 9, walked 1233ft at 2.3 mph at 2.7 METS  B.  Mental Health:  Good mental attitude and Quality of Life (QOL)  improvements:  Overall  19.70 %, Health/Functioning 18.97 %, Socioeconomics 21.30 %, Psych/Spiritual 15.57 %, Family 26.10 %    C.  Education/Instruction/Skills  Accurately checks own pulse.  Rest:  109  Exercise:  106, Knows THR for exercise and Uses Perceived Exertion Scale and/or Dyspnea Scale  Uses Perceived Exertion Scale and/or Dyspnea Scale  D.  Nutrition/Weight Control/Body Composition:  Adherence to prescribed nutrition program: good  and There is no weight on file to calculate BMI. BMI 32.8 percentage Fat 25.3   E.  Blood Lipids    Lab Results  Component Value Date   CHOL 144 03/19/2013   HDL 49 03/19/2013   LDLCALC 73 03/19/2013   TRIG 110 03/19/2013   CHOLHDL 2.9 03/19/2013    F.  Lifestyle Changes:  Making positive lifestyle changes and Not smoking:  Quit 2010  G.  Symptoms noted with exercise:  Asymptomatic  Report Completed By:  Oletta Lamas. Zachary Nole RN   Comments:  This is patients 1st week report. He has done well. He achieved a peak METS of 3.63. His resting HR was 109 and resting BP was 110/62, His peak HR was 106 and peak BP was 130/82. A halfway report will follow upon the 18 th visit.

## 2013-06-20 NOTE — Addendum Note (Signed)
Encounter addended by: Norlene Duel, RN on: 06/20/2013  2:30 PM<BR>     Documentation filed: Clinical Notes

## 2013-06-20 NOTE — Addendum Note (Signed)
Encounter addended by: Norlene Duel, RN on: 06/20/2013  2:29 PM<BR>     Documentation filed: Notes Section

## 2013-06-20 NOTE — Progress Notes (Signed)
Cardiac Rehabilitation Program Outcomes Report   Orientation:  04/10/2013 Halfway report: 05/28/2013 Graduate Date:  tbd Discharge Date:  tbd # of sessions completed: 18 DX: CHF and Nonischemic Cardiomyopathy  Cardiologist: Grant Ruts MD:  Orson Ape Class Time:  11:00  A.  Exercise Program:  Tolerates exercise @ 3.82 METS for 15 minutes  B.  Mental Health:  Good mental attitude  C.  Education/Instruction/Skills  Accurately checks own pulse.  Rest:  89  Exercise:  117, Knows THR for exercise and Uses Perceived Exertion Scale and/or Dyspnea Scale  Uses Perceived Exertion Scale and/or Dyspnea Scale  D.  Nutrition/Weight Control/Body Composition:  Adherence to prescribed nutrition program: good    E.  Blood Lipids    Lab Results  Component Value Date   CHOL 144 03/19/2013   HDL 49 03/19/2013   LDLCALC 73 03/19/2013   TRIG 110 03/19/2013   CHOLHDL 2.9 03/19/2013    F.  Lifestyle Changes:  Making positive lifestyle changes and Not smoking:  Quit 2010  G.  Symptoms noted with exercise:  Asymptomatic  Report Completed By:  Oletta Lamas. Ethan Kasperski RN   Comments:  This is patients halfway report. His resting HR was 89 and his resting BP was 130/68 and his peak HR was 117 and his peak BP was 128/74. A graduation report will follow upon patients 36th visit.

## 2013-06-20 NOTE — Addendum Note (Signed)
Encounter addended by: Norlene Duel, RN on: 06/20/2013  2:37 PM<BR>     Documentation filed: Clinical Notes

## 2013-06-23 ENCOUNTER — Encounter (HOSPITAL_COMMUNITY)
Admission: RE | Admit: 2013-06-23 | Discharge: 2013-06-23 | Disposition: A | Discharge status: Home / Self Care | Payer: BC Managed Care – PPO | Source: Ambulatory Visit | Attending: Cardiology | Admitting: Cardiology

## 2013-06-25 ENCOUNTER — Encounter (HOSPITAL_COMMUNITY)
Admission: RE | Admit: 2013-06-25 | Discharge: 2013-06-25 | Disposition: A | Discharge status: Home / Self Care | Payer: BC Managed Care – PPO | Source: Ambulatory Visit | Attending: Cardiology | Admitting: Cardiology

## 2013-06-27 ENCOUNTER — Encounter (HOSPITAL_COMMUNITY)
Admission: RE | Admit: 2013-06-27 | Discharge: 2013-06-27 | Disposition: A | Discharge status: Home / Self Care | Payer: BC Managed Care – PPO | Source: Ambulatory Visit | Attending: Cardiology | Admitting: Cardiology

## 2013-06-30 ENCOUNTER — Encounter (HOSPITAL_COMMUNITY)
Admission: RE | Admit: 2013-06-30 | Discharge: 2013-06-30 | Disposition: A | Discharge status: Home / Self Care | Payer: BC Managed Care – PPO | Source: Ambulatory Visit | Attending: Cardiology | Admitting: Cardiology

## 2013-06-30 DIAGNOSIS — Z5189 Encounter for other specified aftercare: Secondary | ICD-10-CM | POA: Insufficient documentation

## 2013-06-30 DIAGNOSIS — I509 Heart failure, unspecified: Secondary | ICD-10-CM | POA: Insufficient documentation

## 2013-06-30 DIAGNOSIS — I428 Other cardiomyopathies: Secondary | ICD-10-CM | POA: Insufficient documentation

## 2013-07-02 ENCOUNTER — Encounter (HOSPITAL_COMMUNITY): Payer: BC Managed Care – PPO

## 2013-07-04 ENCOUNTER — Encounter (HOSPITAL_COMMUNITY)
Admission: RE | Admit: 2013-07-04 | Discharge: 2013-07-04 | Disposition: A | Discharge status: Home / Self Care | Payer: BC Managed Care – PPO | Source: Ambulatory Visit | Attending: Cardiology | Admitting: Cardiology

## 2013-07-07 ENCOUNTER — Encounter (HOSPITAL_COMMUNITY)
Admission: RE | Admit: 2013-07-07 | Discharge: 2013-07-07 | Disposition: A | Discharge status: Home / Self Care | Payer: BC Managed Care – PPO | Source: Ambulatory Visit | Attending: Cardiology | Admitting: Cardiology

## 2013-07-09 ENCOUNTER — Encounter (HOSPITAL_COMMUNITY)
Admission: RE | Admit: 2013-07-09 | Discharge: 2013-07-09 | Disposition: A | Discharge status: Home / Self Care | Payer: BC Managed Care – PPO | Source: Ambulatory Visit | Attending: Cardiology | Admitting: Cardiology

## 2013-07-11 ENCOUNTER — Encounter (HOSPITAL_COMMUNITY)
Admission: RE | Admit: 2013-07-11 | Discharge: 2013-07-11 | Disposition: A | Discharge status: Home / Self Care | Payer: BC Managed Care – PPO | Source: Ambulatory Visit | Attending: Cardiology | Admitting: Cardiology

## 2013-07-22 NOTE — Progress Notes (Signed)
Cardiac Rehabilitation Program Outcomes Report   Orientation:  04/10/2013 Graduate Date:  07/11/2013 Discharge Date:  07/11/2013 # of sessions completed: 36 DX: Pulmonary edema, CHF  Cardiologist: Grant Ruts MD:  Orson Ape Class Time:  11:00  A.  Exercise Program:  Tolerates exercise @ 3.81 METS for 15 minutes, Walk Test Results:  Post: Post Walk Test: Rest HR 79, BP 100/70, O2 100%, RPE 7 and RPD 7, 64min HR 86, BP 140/62, O2 98%, RPE 9 and RPD 9, Post HR 73, BP 90/62, O2 100%,RPE 7 and RPD 7.  and Discharged to home exercise program.  Anticipated compliance:  excellent Quality of life overall 19.70, Health and functioning 18.97, Socioeconomic 21.30, Psychological/Spiratual 15.57, and Family 26.10.  B.  Mental Health:  Good mental attitude  C.  Education/Instruction/Skills  Accurately checks own pulse.  Rest:  74  Exercise: 121, Knows THR for exercise, Uses Perceived Exertion Scale and/or Dyspnea Scale and Attended 11 education classes  Uses Perceived Exertion Scale and/or Dyspnea Scale  D.  Nutrition/Weight Control/Body Composition:  Adherence to prescribed nutrition program: good    E.  Blood Lipids    Lab Results  Component Value Date   CHOL 144 03/19/2013   HDL 49 03/19/2013   LDLCALC 73 03/19/2013   TRIG 110 03/19/2013   CHOLHDL 2.9 03/19/2013    F.  Lifestyle Changes:  Making positive lifestyle changes and Not smoking:  Quit 2010  G.  Symptoms noted with exercise:  Asymptomatic  Report Completed By:  Oletta Lamas. Velora Horstman RN   Comments:  This is patients graduation report. He done very well while in rehab. He achieved a peak METS of 3.81. His resting HR was 74 and resting BP was 100/70, his peak Hr was 121 and his peak BP was 126/74. A call will be made to patient at 1 month,30month and 1 year post graduation, to ensure exercise compliance.

## 2013-09-04 LAB — BASIC METABOLIC PANEL
BUN: 32 mg/dL — AB (ref 6–23)
CO2: 24 mEq/L (ref 19–32)
Calcium: 9.9 mg/dL (ref 8.4–10.5)
Chloride: 102 mEq/L (ref 96–112)
Creat: 1.54 mg/dL — ABNORMAL HIGH (ref 0.50–1.35)
Glucose, Bld: 62 mg/dL — ABNORMAL LOW (ref 70–99)
Potassium: 4.9 mEq/L (ref 3.5–5.3)
Sodium: 137 mEq/L (ref 135–145)

## 2013-09-05 ENCOUNTER — Encounter: Payer: Self-pay | Admitting: *Deleted

## 2013-09-12 ENCOUNTER — Ambulatory Visit (INDEPENDENT_AMBULATORY_CARE_PROVIDER_SITE_OTHER): Payer: BC Managed Care – PPO | Admitting: Cardiology

## 2013-09-12 ENCOUNTER — Encounter: Payer: Self-pay | Admitting: Cardiology

## 2013-09-12 VITALS — BP 109/63 | HR 79 | Ht 67.0 in | Wt 182.0 lb

## 2013-09-12 DIAGNOSIS — N183 Chronic kidney disease, stage 3 unspecified: Secondary | ICD-10-CM

## 2013-09-12 DIAGNOSIS — I10 Essential (primary) hypertension: Secondary | ICD-10-CM

## 2013-09-12 DIAGNOSIS — I428 Other cardiomyopathies: Secondary | ICD-10-CM

## 2013-09-12 DIAGNOSIS — E119 Type 2 diabetes mellitus without complications: Secondary | ICD-10-CM

## 2013-09-12 NOTE — Assessment & Plan Note (Signed)
LVEF approximately 40%. Plan to continue medical therapy, no change to current doses. Recheck BMET for next visit in 4 months. Continue regular exercise.

## 2013-09-12 NOTE — Patient Instructions (Signed)
Your physician wants you to follow-up in:  4 months You will receive a reminder letter in the mail two months in advance. If you don't receive a letter, please call our office to schedule the follow-up appointment.    Your physician recommends that you continue on your current medications as directed. Please refer to the Current Medication list given to you today.    Please get lab work just before next visit in 4 months  (BMET)    Thank you for choosing [facility] !

## 2013-09-12 NOTE — Assessment & Plan Note (Signed)
Keep followup with Dr. McGough. 

## 2013-09-12 NOTE — Assessment & Plan Note (Signed)
Stable creatinine of 1.5.

## 2013-09-12 NOTE — Assessment & Plan Note (Signed)
Blood pressure is normal today. 

## 2013-09-12 NOTE — Progress Notes (Signed)
Clinical Summary Mr. Woodin is a 63 y.o.male last seen in February of this year. He participated in cardiac rehabilitation and is now exercising on his own at the [facility]. He is here with his wife today. Doing very well overall was stable NYHA class 1-2 dyspnea. Sometimes feels dizzy if he stands up quickly. Otherwise negative.  Recent BMET showed potassium 4.9, BUN 32, stable creatinine at 1.5. Weight is down approximately 13 pounds since last visit.  Echocardiogram done recently in February showed moderate LVH with LVEF approximately 53%, grade 1 diastolic dysfunction, mild left atrial enlargement, lipomatous hypertrophy of the atrial septum. Although not normalized, LVEF has improved compared to initial testing on medical therapy.  We reviewed his medications today.   No Known Allergies  Current Outpatient Prescriptions  Medication Sig Dispense Refill  . aspirin 81 MG tablet Take 81 mg by mouth daily.      . carvedilol (COREG) 12.5 MG tablet Take 1 tablet (12.5 mg total) by mouth 2 (two) times daily.  60 tablet  6  . fluticasone (FLONASE) 50 MCG/ACT nasal spray Place 1 spray into the nose daily.       . furosemide (LASIX) 40 MG tablet Take 20 mg by mouth daily.       Marland Kitchen glimepiride (AMARYL) 4 MG tablet Take 4 mg by mouth daily before breakfast.      . linagliptin (TRADJENTA) 5 MG TABS tablet Take 5 mg by mouth daily.      Marland Kitchen lisinopril (PRINIVIL,ZESTRIL) 20 MG tablet Take 1 tablet (20 mg total) by mouth daily.  30 tablet  6  . metFORMIN (GLUCOPHAGE) 1000 MG tablet Take 1,000 mg by mouth 2 (two) times daily with a meal.      . ONE TOUCH ULTRA TEST test strip       . simvastatin (ZOCOR) 20 MG tablet Take 20 mg by mouth every evening.      Marland Kitchen spironolactone (ALDACTONE) 25 MG tablet Take 0.5 tablets (12.5 mg total) by mouth daily.  30 tablet  6   No current facility-administered medications for this visit.    Past Medical History  Diagnosis Date  . Type 2 diabetes mellitus   .  Essential hypertension, benign   . Hypercholesteremia   . Colonic adenoma 2006  . Chronic systolic CHF (congestive heart failure)     a. 03/2013 cho: EF 20-25%, diff HK, sev HK of basal-mid inf, mid-dist inflat and dist antlat walls. Gr 1 DD, mild MR.  . Nonischemic cardiomyopathy     a. 03/2013 Cath: LM nl, LAD nl, D1/2/3 small, nl, LCX nondom, nl, OM1/2/3 nl, RCA dom, nl, EF 15%, mild MR.  Marland Kitchen Hypertensive heart disease   . Alcohol dependency   . Obesity     Social History Mr. Lipschutz reports that he quit smoking about 5 years ago. His smoking use included Cigarettes. He has a 12 pack-year smoking history. He does not have any smokeless tobacco history on file. Mr. Geisen reports that he drinks alcohol.  Review of Systems Head no palpitations, dizziness, syncope. No leg edema. Otherwise as outlined.  Physical Examination Filed Vitals:   09/12/13 1305  BP: 109/63  Pulse: 79   Filed Weights   09/12/13 1305  Weight: 182 lb (82.555 kg)    Patient comfortable at rest.  HEENT: Conjunctiva and lids normal, oropharynx clear.  Neck: Supple, no elevated JVP or carotid bruits, no thyromegaly.  Lungs: Clear to auscultation, nonlabored breathing at rest.  Cardiac: Regular rate  and rhythm, no S3, no pericardial rub.  Abdomen: Soft, nontender, bowel sounds present, no guarding or rebound.  Extremities: No pitting edema, distal pulses 2+.  Skin: Warm and dry.  Musculoskeletal: No kyphosis.  Neuropsychiatric: Alert and oriented x3, affect grossly appropriate.   Problem List and Plan   Nonischemic cardiomyopathy LVEF approximately 40%. Plan to continue medical therapy, no change to current doses. Recheck BMET for next visit in 4 months. Continue regular exercise.  CKD (chronic kidney disease) stage 3, GFR 30-59 ml/min Stable creatinine of 1.5.  Essential hypertension, benign Blood pressure is normal today.  Type 2 diabetes mellitus Keep followup with Dr. Orson Ape.    Satira Sark, M.D., F.A.C.C.

## 2013-10-23 ENCOUNTER — Telehealth: Payer: Self-pay | Admitting: *Deleted

## 2013-10-23 NOTE — Telephone Encounter (Signed)
Pt wanted to know how much Lasix to take. Pt now weighs 177lbs from 182lb last month. Spoke with KL, NP , pt is to be on 20 mg daily. Pt understood.

## 2013-12-11 ENCOUNTER — Ambulatory Visit (INDEPENDENT_AMBULATORY_CARE_PROVIDER_SITE_OTHER): Payer: BC Managed Care – PPO | Admitting: Cardiology

## 2013-12-11 ENCOUNTER — Encounter: Payer: Self-pay | Admitting: Cardiology

## 2013-12-11 ENCOUNTER — Encounter (INDEPENDENT_AMBULATORY_CARE_PROVIDER_SITE_OTHER): Payer: Self-pay

## 2013-12-11 VITALS — BP 112/78 | HR 78 | Ht 67.0 in | Wt 179.0 lb

## 2013-12-11 DIAGNOSIS — I10 Essential (primary) hypertension: Secondary | ICD-10-CM

## 2013-12-11 DIAGNOSIS — I428 Other cardiomyopathies: Secondary | ICD-10-CM

## 2013-12-11 DIAGNOSIS — E7800 Pure hypercholesterolemia, unspecified: Secondary | ICD-10-CM

## 2013-12-11 DIAGNOSIS — N183 Chronic kidney disease, stage 3 unspecified: Secondary | ICD-10-CM

## 2013-12-11 NOTE — Assessment & Plan Note (Signed)
Continues on Zocor.

## 2013-12-11 NOTE — Assessment & Plan Note (Addendum)
LVEF approximately 40%. He is symptomatically stable and appears euvolemic. I agree with recent changes in medications to allow blood pressure to come up somewhat. I suspect his significant weight loss has lead to better blood pressure in general, requiring a change in his medication doses.

## 2013-12-11 NOTE — Progress Notes (Signed)
Clinical Summary Corey Murphy is a 63 y.o.male last seen in May. He is here with his wife today. He had a recent visit with Dr. Orson Ape noting that his blood pressure had been on the low side and he was feeling weak and dizzy. Doses of lisinopril and Aldactone were cut in half as reflected below. He just made this change today. Blood pressure heart rate are in good range today.  Otherwise, he has had no chest pain, reports NYHA class 1-2 shortness of breath. He exercises regularly, and has been maintaining a good weight after significant weight reduction through diet and exercise.  Echocardiogram done in February showed moderate LVH with LVEF approximately 82%, grade 1 diastolic dysfunction, mild left atrial enlargement, lipomatous hypertrophy of the atrial septum. Although not normalized, LVEF has improved compared to initial testing on medical therapy.   No Known Allergies  Current Outpatient Prescriptions  Medication Sig Dispense Refill  . aspirin 81 MG tablet Take 81 mg by mouth daily.      . carvedilol (COREG) 12.5 MG tablet Take 1 tablet (12.5 mg total) by mouth 2 (two) times daily.  60 tablet  6  . fluticasone (FLONASE) 50 MCG/ACT nasal spray Place 1 spray into the nose daily.       . furosemide (LASIX) 20 MG tablet Take 20 mg by mouth daily.      Marland Kitchen HYDROcodone-acetaminophen (NORCO) 10-325 MG per tablet Take 1 tablet by mouth every 6 (six) hours as needed.      . linagliptin (TRADJENTA) 5 MG TABS tablet Take 5 mg by mouth daily.      Marland Kitchen lisinopril (PRINIVIL,ZESTRIL) 10 MG tablet Take 10 mg by mouth daily.      . metFORMIN (GLUCOPHAGE) 1000 MG tablet Take 1,000 mg by mouth 2 (two) times daily with a meal.      . ONE TOUCH ULTRA TEST test strip       . simvastatin (ZOCOR) 20 MG tablet Take 20 mg by mouth every evening.      Marland Kitchen spironolactone (ALDACTONE) 25 MG tablet Take 0.5 tablets (12.5 mg total) by mouth daily.  30 tablet  6   No current facility-administered medications for this  visit.    Past Medical History  Diagnosis Date  . Type 2 diabetes mellitus   . Essential hypertension, benign   . Hypercholesteremia   . Colonic adenoma 2006  . Chronic systolic CHF (congestive heart failure)     a. 03/2013 cho: EF 20-25%, diff HK, sev HK of basal-mid inf, mid-dist inflat and dist antlat walls. Gr 1 DD, mild MR.  . Nonischemic cardiomyopathy     a. 03/2013 Cath: LM nl, LAD nl, D1/2/3 small, nl, LCX nondom, nl, OM1/2/3 nl, RCA dom, nl, EF 15%, mild MR.  Marland Kitchen Hypertensive heart disease   . Alcohol dependency   . Obesity     Social History Corey Murphy reports that he quit smoking about 5 years ago. His smoking use included Cigarettes. He has a 12 pack-year smoking history. He does not have any smokeless tobacco history on file. Corey Murphy reports that he drinks alcohol.  Review of Systems No palpitations. No orthopnea or PND. No leg edema. Other systems reviewed and negative except as outlined.  Physical Examination Filed Vitals:   12/11/13 1139  BP: 112/78  Pulse: 78   Filed Weights   12/11/13 1139  Weight: 179 lb (81.194 kg)    Patient comfortable at rest.  HEENT: Conjunctiva and lids normal, oropharynx  clear.  Neck: Supple, no elevated JVP or carotid bruits, no thyromegaly.  Lungs: Clear to auscultation, nonlabored breathing at rest.  Cardiac: Regular rate and rhythm, no S3, no pericardial rub.  Abdomen: Soft, nontender, bowel sounds present, no guarding or rebound.  Extremities: No pitting edema, distal pulses 2+.  Skin: Warm and dry.  Musculoskeletal: No kyphosis.  Neuropsychiatric: Alert and oriented x3, affect grossly appropriate.   Problem List and Plan   Nonischemic cardiomyopathy LVEF approximately 40%. He is symptomatically stable and appears euvolemic. I agree with recent changes in medications to allow blood pressure to come up somewhat. I suspect his significant weight loss has lead to better blood pressure in general, requiring a change in  his medication doses.  CKD (chronic kidney disease) stage 3, GFR 30-59 ml/min Most recent creatinine 1.5. Followup BMET for next visit.  Hypercholesteremia Continues on Zocor.    Satira Sark, M.D., F.A.C.C.

## 2013-12-11 NOTE — Assessment & Plan Note (Signed)
Most recent creatinine 1.5. Followup BMET for next visit.

## 2013-12-11 NOTE — Patient Instructions (Signed)
Your physician recommends that you schedule a follow-up appointment in: 3 months     Your physician recommends that you continue on your current medications as directed. Please refer to the Current Medication list given to you today.    Please get blood work done 1 week before your next visit with Dr.McDowell  (BMET)       Thank you for choosing [facility] !

## 2013-12-23 ENCOUNTER — Telehealth: Payer: Self-pay | Admitting: Cardiology

## 2013-12-23 MED ORDER — SPIRONOLACTONE 25 MG PO TABS: 12.5000 mg | ORAL_TABLET | Freq: Every day | ORAL | Status: DC

## 2013-12-23 NOTE — Telephone Encounter (Signed)
rx called to [facility] added new pharmacy profile to pt record

## 2013-12-23 NOTE — Telephone Encounter (Signed)
patient needs RX for Spironolactone 25 mg 90 day supply sent to [facility].  Phone number is [phone] and fax number is [phone]. / tgs

## 2014-01-12 ENCOUNTER — Ambulatory Visit: Payer: BC Managed Care – PPO | Admitting: Cardiology

## 2014-03-04 ENCOUNTER — Other Ambulatory Visit: Payer: Self-pay | Admitting: Physician Assistant

## 2014-03-05 MED ORDER — FUROSEMIDE 20 MG PO TABS: 20.0000 mg | ORAL_TABLET | Freq: Every day | ORAL | Status: DC

## 2014-03-14 LAB — BASIC METABOLIC PANEL
BUN: 26 mg/dL — ABNORMAL HIGH (ref 6–23)
CHLORIDE: 103 meq/L (ref 96–112)
CO2: 27 mEq/L (ref 19–32)
Calcium: 9.7 mg/dL (ref 8.4–10.5)
Creat: 1.44 mg/dL — ABNORMAL HIGH (ref 0.50–1.35)
GLUCOSE: 104 mg/dL — AB (ref 70–99)
POTASSIUM: 4.6 meq/L (ref 3.5–5.3)
SODIUM: 139 meq/L (ref 135–145)

## 2014-03-20 ENCOUNTER — Encounter: Payer: Self-pay | Admitting: Cardiology

## 2014-03-20 ENCOUNTER — Ambulatory Visit (INDEPENDENT_AMBULATORY_CARE_PROVIDER_SITE_OTHER): Payer: BC Managed Care – PPO | Admitting: Cardiology

## 2014-03-20 VITALS — BP 106/68 | HR 82 | Ht 67.0 in | Wt 177.0 lb

## 2014-03-20 DIAGNOSIS — N183 Chronic kidney disease, stage 3 unspecified: Secondary | ICD-10-CM

## 2014-03-20 DIAGNOSIS — I10 Essential (primary) hypertension: Secondary | ICD-10-CM

## 2014-03-20 DIAGNOSIS — I428 Other cardiomyopathies: Secondary | ICD-10-CM

## 2014-03-20 DIAGNOSIS — I429 Cardiomyopathy, unspecified: Secondary | ICD-10-CM

## 2014-03-20 NOTE — Assessment & Plan Note (Signed)
Blood pressure is normal today, no changes made to medical regimen. 

## 2014-03-20 NOTE — Assessment & Plan Note (Signed)
Recent creatinine 1.4.

## 2014-03-20 NOTE — Patient Instructions (Signed)
Your physician wants you to follow-up in: 4 months You will receive a reminder letter in the mail two months in advance. If you don't receive a letter, please call our office to schedule the follow-up appointment.   Your physician recommends that you continue on your current medications as directed. Please refer to the Current Medication list given to you today.      Thank you for choosing [facility] !         

## 2014-03-20 NOTE — Progress Notes (Signed)
Reason for visit: Cardiomyopathy, hypertension  Clinical Summary Mr. Allcock is a 63 y.o.male last seen in August. He is here with his wife for a routine visit. From a cardiac perspective he continues to do well. He exercises at the gym 6 days a week, this includes 30 minutes on the treadmill, 30 minutes on the NuStep, warmup and cooldown time. At home he also uses a stationary bicycle sometimes twice a day. He does not report any angina symptoms, has NYHA class I dyspnea.  Recent lab work showed potassium 4.6, BUN 26, creatinine 1.4 down from 1.5. I discussed this with him today. He continues on the medications outlined below, lisinopril was reduced since I last saw him. Blood pressure today is normal.  Echocardiogram done in February showed moderate LVH with LVEF approximately 84%, grade 1 diastolic dysfunction, mild left atrial enlargement, lipomatous hypertrophy of the atrial septum.  Mr. Chuong tells me today that he has had long-term disability related to osteoarthritis and hip pain. He was previously followed for this by Dr. Orson Ape who has recently retired. He is now followed by Dr. Gerarda Fraction. His insurance company recently communicated with this office regarding his cardiovascular status. He has done well on medical therapy, showing some improvement in LVEF, and in general I would not consider him a disability candidate from a purely cardiac perspective. Whether or not he remains a candidate for disability based on other medical conditions including osteoarthritis, would not be a determination of ours, and should be handled by his primary care provider or other disability determination specialists. I discussed this with the patient and his wife today. He states he has an attorney helping him with his disability.  No Known Allergies  Current Outpatient Prescriptions  Medication Sig Dispense Refill  . aspirin 81 MG tablet Take 81 mg by mouth daily.    . carvedilol (COREG) 12.5 MG tablet Take 1  tablet (12.5 mg total) by mouth 2 (two) times daily. 60 tablet 6  . furosemide (LASIX) 20 MG tablet Take 1 tablet (20 mg total) by mouth daily. 90 tablet 3  . HYDROcodone-acetaminophen (NORCO) 10-325 MG per tablet Take 1 tablet by mouth every 6 (six) hours as needed.    . linagliptin (TRADJENTA) 5 MG TABS tablet Take 5 mg by mouth daily.    Marland Kitchen lisinopril (PRINIVIL,ZESTRIL) 10 MG tablet Take 5 mg by mouth daily.     . metFORMIN (GLUCOPHAGE) 1000 MG tablet Take 1,000 mg by mouth 2 (two) times daily with a meal.    . ONE TOUCH ULTRA TEST test strip     . simvastatin (ZOCOR) 20 MG tablet Take 20 mg by mouth every evening.    Marland Kitchen spironolactone (ALDACTONE) 25 MG tablet Take 0.5 tablets (12.5 mg total) by mouth daily. 45 tablet 3   No current facility-administered medications for this visit.    Past Medical History  Diagnosis Date  . Type 2 diabetes mellitus   . Essential hypertension, benign   . Hypercholesteremia   . Colonic adenoma 2006  . Chronic systolic CHF (congestive heart failure)     a. 03/2013 cho: EF 20-25%, diff HK, sev HK of basal-mid inf, mid-dist inflat and dist antlat walls. Gr 1 DD, mild MR.  . Nonischemic cardiomyopathy     a. 03/2013 Cath: LM nl, LAD nl, D1/2/3 small, nl, LCX nondom, nl, OM1/2/3 nl, RCA dom, nl, EF 15%, mild MR.  Marland Kitchen Hypertensive heart disease   . Alcohol dependency   . Obesity  Social History Mr. Baskett reports that he quit smoking about 5 years ago. His smoking use included Cigarettes. He has a 12 pack-year smoking history. He does not have any smokeless tobacco history on file. Mr. Dorsch reports that he drinks alcohol.  Review of Systems Complete review of systems negative except as otherwise outlined in the clinical summary.  Physical Examination Filed Vitals:   03/20/14 1102  BP: 106/68  Pulse: 82   Filed Weights   03/20/14 1102  Weight: 177 lb (80.287 kg)    Patient comfortable at rest.  HEENT: Conjunctiva and lids normal, oropharynx  clear.  Neck: Supple, no elevated JVP or carotid bruits, no thyromegaly.  Lungs: Clear to auscultation, nonlabored breathing at rest.  Cardiac: Regular rate and rhythm, no S3, no pericardial rub.  Abdomen: Soft, nontender, bowel sounds present, no guarding or rebound.  Extremities: No pitting edema, distal pulses 2+.  Skin: Warm and dry.  Musculoskeletal: No kyphosis.  Neuropsychiatric: Alert and oriented x3, affect grossly appropriate.   Problem List and Plan   Nonischemic cardiomyopathy Currently doing very well on medical therapy, LVEF 40%, NYHA class I dyspnea, exercising regularly. We discussed continued efforts at diet and exercise, he remains compliant with medical therapy. Follow-up arranged in 4 months.  Essential hypertension, benign Blood pressure is normal today, no changes made to medical regimen.  CKD (chronic kidney disease) stage 3, GFR 30-59 ml/min Recent creatinine 1.4.    Satira Sark, M.D., F.A.C.C.

## 2014-03-20 NOTE — Assessment & Plan Note (Signed)
Currently doing very well on medical therapy, LVEF 40%, NYHA class I dyspnea, exercising regularly. We discussed continued efforts at diet and exercise, he remains compliant with medical therapy. Follow-up arranged in 4 months.

## 2014-04-09 ENCOUNTER — Encounter (HOSPITAL_COMMUNITY): Payer: Self-pay | Admitting: Cardiovascular Disease

## 2014-04-14 NOTE — Addendum Note (Signed)
Encounter addended by: Zada Girt, CCT on: 04/14/2014 12:48 PM<BR>     Documentation filed: Flowsheet VN

## 2014-04-14 NOTE — Addendum Note (Signed)
Encounter addended by: Cathie Olden, RN on: 04/14/2014  2:10 PM<BR>     Documentation filed: Notes Section

## 2014-04-14 NOTE — Progress Notes (Signed)
Patient is discharged from [facility] today July 11, 2013 with 36 sessions.  He achieved LTG of 30 minutes of aerobic exercise at max met level of 3.81.  All patient vitals are WNL.  Patient has met with dietician.  Discharge instructions have been reviewed in detail and patient expressed an understanding of material given by D Coad EP.  Patient plans to join local gym for follow up exercise. Cardiac Rehab will make 1 month, 6 month and 1 year call backs.  Patient had no complaints of any abnormal S/S or pain on their exit visit.

## 2014-07-15 ENCOUNTER — Other Ambulatory Visit: Payer: Self-pay | Admitting: Cardiology

## 2014-07-16 LAB — BASIC METABOLIC PANEL
BUN: 24 mg/dL — ABNORMAL HIGH (ref 6–23)
CO2: 26 meq/L (ref 19–32)
CREATININE: 1.49 mg/dL — AB (ref 0.50–1.35)
Calcium: 9.7 mg/dL (ref 8.4–10.5)
Chloride: 102 mEq/L (ref 96–112)
GLUCOSE: 88 mg/dL (ref 70–99)
POTASSIUM: 4.5 meq/L (ref 3.5–5.3)
Sodium: 139 mEq/L (ref 135–145)

## 2014-07-24 ENCOUNTER — Encounter: Payer: Self-pay | Admitting: Cardiology

## 2014-07-24 ENCOUNTER — Ambulatory Visit (INDEPENDENT_AMBULATORY_CARE_PROVIDER_SITE_OTHER): Payer: BLUE CROSS/BLUE SHIELD | Admitting: Cardiology

## 2014-07-24 VITALS — BP 114/68 | HR 84 | Ht 68.0 in | Wt 179.0 lb

## 2014-07-24 DIAGNOSIS — E119 Type 2 diabetes mellitus without complications: Secondary | ICD-10-CM | POA: Diagnosis not present

## 2014-07-24 DIAGNOSIS — I428 Other cardiomyopathies: Secondary | ICD-10-CM

## 2014-07-24 DIAGNOSIS — I10 Essential (primary) hypertension: Secondary | ICD-10-CM | POA: Diagnosis not present

## 2014-07-24 DIAGNOSIS — I429 Cardiomyopathy, unspecified: Secondary | ICD-10-CM

## 2014-07-24 MED ORDER — FUROSEMIDE 20 MG PO TABS: 20.0000 mg | ORAL_TABLET | ORAL | Status: DC | PRN

## 2014-07-24 NOTE — Patient Instructions (Signed)
Your physician wants you to follow-up in: 4 months with Dr.McDowell You will receive a reminder letter in the mail two months in advance. If you don't receive a letter, please call our office to schedule the follow-up appointment.  Take Lasix only as needed now    Thank you for choosing [facility] !

## 2014-07-24 NOTE — Progress Notes (Signed)
Cardiology Office Note  Date: 07/24/2014   ID: Corey Murphy, DOB 1951/02/21, MRN [id number]  PCP: Leonides Grills, MD  Primary Cardiologist: Rozann Lesches, MD   Chief Complaint  Patient presents with  . Cardiomyopathy  . Hypertension    History of Present Illness: Corey Murphy is a 64 y.o. male last seen in November 2015. He presents for a routine visit. Still doing very well, exercises almost every day each week, reports NYHA class I dyspnea and no chest pain. We reviewed his medications which are outlined below. He reports no intolerances. Blood pressure today is normal. He does not indicate any progressive weight gain or edema. We discussed cutting his Lasix back to as needed and continuing to track weights.   He states that he will be seeing Dr. Gerarda Fraction sent for a physical with lab work.   Past Medical History  Diagnosis Date  . Type 2 diabetes mellitus   . Essential hypertension, benign   . Hypercholesteremia   . Colonic adenoma 2006  . Chronic systolic CHF (congestive heart failure)   . Nonischemic cardiomyopathy     LVEF approximately 40% 06/2013  . Hypertensive heart disease   . Alcohol dependency   . Obesity   . History of cardiac catheterization     03/2013 LM nl, LAD nl, D1/2/3 small - nl, LCX nondom - nl, OM1/2/3 nl, RCA dom     Current Outpatient Prescriptions  Medication Sig Dispense Refill  . aspirin 81 MG tablet Take 81 mg by mouth daily.    . carvedilol (COREG) 12.5 MG tablet Take 1 tablet (12.5 mg total) by mouth 2 (two) times daily. 60 tablet 6  . furosemide (LASIX) 20 MG tablet Take 1 tablet (20 mg total) by mouth as needed. 90 tablet 3  . HYDROcodone-acetaminophen (NORCO) 10-325 MG per tablet Take 1 tablet by mouth every 6 (six) hours as needed.    . linagliptin (TRADJENTA) 5 MG TABS tablet Take 5 mg by mouth daily.    . metFORMIN (GLUCOPHAGE) 1000 MG tablet Take 1,000 mg by mouth 2 (two) times daily with a meal.    . ONE TOUCH ULTRA TEST test  strip     . simvastatin (ZOCOR) 20 MG tablet Take 20 mg by mouth every evening.    Marland Kitchen spironolactone (ALDACTONE) 25 MG tablet Take 0.5 tablets (12.5 mg total) by mouth daily. 45 tablet 3   No current facility-administered medications for this visit.    Allergies:  Review of patient's allergies indicates no known allergies.   Social History: The patient  reports that he quit smoking about 6 years ago. His smoking use included Cigarettes. He has a 12 pack-year smoking history. He does not have any smokeless tobacco history on file. He reports that he drinks alcohol. He reports that he does not use illicit drugs.   ROS:  Please see the history of present illness. Otherwise, complete review of systems is positive for none.  All other systems are reviewed and negative.   Physical Exam: VS:  BP 114/68 mmHg  Pulse 84  Ht 5\' 8"  (1.727 m)  Wt 179 lb (81.194 kg)  BMI 27.22 kg/m2  SpO2 95%, BMI Body mass index is 27.22 kg/(m^2).  Wt Readings from Last 3 Encounters:  07/24/14 179 lb (81.194 kg)  03/20/14 177 lb (80.287 kg)  12/11/13 179 lb (81.194 kg)     Patient comfortable at rest.  HEENT: Conjunctiva and lids normal, oropharynx clear.  Neck: Supple, no elevated JVP or  carotid bruits, no thyromegaly.  Lungs: Clear to auscultation, nonlabored breathing at rest.  Cardiac: Regular rate and rhythm, no S3, no pericardial rub.  Abdomen: Soft, nontender, bowel sounds present, no guarding or rebound.  Extremities: No pitting edema, distal pulses 2+.  Skin: Warm and dry.  Musculoskeletal: No kyphosis.  Neuropsychiatric: Alert and oriented x3, affect grossly appropriate.   ECG: ECG is not ordered today.  Recent Labwork: 03/13/2014: BUN 26*; Creatinine 1.44*; Potassium 4.6; Sodium 139     Component Value Date/Time   CHOL 144 03/19/2013 0428   TRIG 110 03/19/2013 0428   HDL 49 03/19/2013 0428   CHOLHDL 2.9 03/19/2013 0428   VLDL 22 03/19/2013 0428   LDLCALC 73 03/19/2013 0428     Other Studies Reviewed Today:  Echocardiogram 06/04/2013: Study Conclusions  - Left ventricle: The cavity size was normal. Wall thickness was increased in a pattern of moderate LVH. Systolic function was moderately reduced. The estimated ejection fraction was in the range of 35% to 40%. There was an increased relative contribution of atrial contraction to ventricular filling. Doppler parameters are consistent with abnormal left ventricular relaxation (grade 1 diastolic dysfunction). - Regional wall motion abnormality: Severe hypokinesis of the mid inferior myocardium; moderate hypokinesis of the mid inferoseptal, mid inferolateral, mid anterolateral, and apical lateral myocardium; mild hypokinesis of the mid-apical anterior myocardium. - Left atrium: The atrium was mildly dilated. - Atrial septum: There was increased thickness of the septum, consistent with lipomatous hypertrophy.   ASSESSMENT AND PLAN:  1. Nonischemic cardiopathy with LVEF approximately 40%. Patient is symptomatically quite stable on current medical treatment. We will reduce Lasix to as needed use, he still needs to track weights daily. Follow-up arranged in the next 4 months.  2. Essential hypertension, blood pressure is normal today.  3. Type 2 diabetes mellitus, keep follow-up with Dr. Gerarda Fraction.  Current medicines were reviewed at length with the patient today.  Disposition: FU with me in 4 months.   Signed, Satira Sark, MD, [facility] 07/24/2014 11:27 AM    [facility] [address] [place], [place] [address] Phone: [phone]; Fax: [phone]

## 2014-12-05 ENCOUNTER — Other Ambulatory Visit: Payer: Self-pay | Admitting: Cardiology

## 2014-12-17 ENCOUNTER — Encounter: Payer: Self-pay | Admitting: Cardiology

## 2014-12-17 ENCOUNTER — Ambulatory Visit (INDEPENDENT_AMBULATORY_CARE_PROVIDER_SITE_OTHER): Payer: BLUE CROSS/BLUE SHIELD | Admitting: Cardiology

## 2014-12-17 VITALS — BP 126/80 | HR 57 | Ht 68.0 in | Wt 187.8 lb

## 2014-12-17 DIAGNOSIS — I428 Other cardiomyopathies: Secondary | ICD-10-CM

## 2014-12-17 DIAGNOSIS — I10 Essential (primary) hypertension: Secondary | ICD-10-CM

## 2014-12-17 DIAGNOSIS — I429 Cardiomyopathy, unspecified: Secondary | ICD-10-CM

## 2014-12-17 NOTE — Patient Instructions (Signed)
Your physician wants you to follow-up in: 4 months with Dr.McDowell You will receive a reminder letter in the mail two months in advance. If you don't receive a letter, please call our office to schedule the follow-up appointment.    Your physician recommends that you continue on your current medications as directed. Please refer to the Current Medication list given to you today.     Thank you for choosing [facility] !    

## 2014-12-17 NOTE — Progress Notes (Signed)
Cardiology Office Note  Date: 12/17/2014   ID: Corey Murphy, DOB April 01, 1951, MRN [id number]  PCP: Corey Murphy., MD  Primary Cardiologist: Corey Lesches, MD   Chief Complaint  Patient presents with  . Cardiomyopathy    History of Present Illness: Corey Murphy is a 64 y.o. male last seen in March.  He presents for a follow-up visit. No change in symptoms, reports NYHA class I dyspnea, no angina or palpitations.  He continues to exercise at least an hour 5 days a week.  We reviewed his cardiac medications which are unchanged. Follow-up ECG today shows sinus rhythm with prolonged PR interval , PAC and PVC.  Weight has been stable using as needed Lasix. He has had no orthopnea or PND.   Past Medical History  Diagnosis Date  . Type 2 diabetes mellitus   . Essential hypertension, benign   . Hypercholesteremia   . Colonic adenoma 2006  . Chronic systolic CHF (congestive heart failure)   . Nonischemic cardiomyopathy     LVEF approximately 40% 06/2013  . Hypertensive heart disease   . Alcohol dependency   . Obesity   . History of cardiac catheterization     03/2013 LM nl, LAD nl, D1/2/3 small - nl, LCX nondom - nl, OM1/2/3 nl, RCA dom    Current Outpatient Prescriptions  Medication Sig Dispense Refill  . aspirin 81 MG tablet Take 81 mg by mouth daily.    . carvedilol (COREG) 12.5 MG tablet Take 1 tablet (12.5 mg total) by mouth 2 (two) times daily. 60 tablet 6  . furosemide (LASIX) 20 MG tablet Take 1 tablet (20 mg total) by mouth as needed. 90 tablet 3  . glimepiride (AMARYL) 2 MG tablet Take 2 mg by mouth daily with breakfast.    . HYDROcodone-acetaminophen (NORCO) 10-325 MG per tablet Take 1 tablet by mouth every 6 (six) hours as needed.    Marland Kitchen lisinopril (PRINIVIL,ZESTRIL) 2.5 MG tablet Take 2.5 mg by mouth daily.    . ONE TOUCH ULTRA TEST test strip     . simvastatin (ZOCOR) 20 MG tablet Take 20 mg by mouth every evening.    Marland Kitchen spironolactone (ALDACTONE) 25 MG  tablet TAKE 1/2 TABLET DAILY 45 tablet 3  . terbinafine (LAMISIL) 250 MG tablet Take 250 mg by mouth daily.     No current facility-administered medications for this visit.    Allergies:  Review of patient's allergies indicates no known allergies.   Social History: The patient  reports that he quit smoking about 6 years ago. His smoking use included Cigarettes. He has a 12 pack-year smoking history. He does not have any smokeless tobacco history on file. He reports that he drinks alcohol. He reports that he does not use illicit drugs.   ROS:  Please see the history of present illness. Otherwise, complete review of systems is positive for none.  All other systems are reviewed and negative.   Physical Exam: VS:  BP 126/80 mmHg  Pulse 57  Ht 5\' 8"  (1.727 m)  Wt 187 lb 12.8 oz (85.186 kg)  BMI 28.56 kg/m2  SpO2 99%, BMI Body mass index is 28.56 kg/(m^2).  Wt Readings from Last 3 Encounters:  12/17/14 187 lb 12.8 oz (85.186 kg)  07/24/14 179 lb (81.194 kg)  03/20/14 177 lb (80.287 kg)     Patient comfortable at rest.  HEENT: Conjunctiva and lids normal, oropharynx clear.  Neck: Supple, no elevated JVP or carotid bruits, no thyromegaly.  Lungs: Clear to  auscultation, nonlabored breathing at rest.  Cardiac: Regular rate and rhythm, no S3, no pericardial rub.  Abdomen: Soft, nontender, bowel sounds present, no guarding or rebound.  Extremities: No pitting edema, distal pulses 2+.  Skin: Warm and dry.  Musculoskeletal: No kyphosis.  Neuropsychiatric: Alert and oriented x3, affect grossly appropriate.   ECG: ECG is ordered today.   Recent Labwork: 07/15/2014: BUN 24*; Creat 1.49*; Potassium 4.5; Sodium 139     Component Value Date/Time   CHOL 144 03/19/2013 0428   TRIG 110 03/19/2013 0428   HDL 49 03/19/2013 0428   CHOLHDL 2.9 03/19/2013 0428   VLDL 22 03/19/2013 0428   LDLCALC 73 03/19/2013 0428    Other Studies Reviewed Today:  Echocardiogram 06/04/2013: Study  Conclusions  - Left ventricle: The cavity size was normal. Wall thickness was increased in a pattern of moderate LVH. Systolic function was moderately reduced. The estimated ejection fraction was in the range of 35% to 40%. There was an increased relative contribution of atrial contraction to ventricular filling. Doppler parameters are consistent with abnormal left ventricular relaxation (grade 1 diastolic dysfunction). - Regional wall motion abnormality: Severe hypokinesis of the mid inferior myocardium; moderate hypokinesis of the mid inferoseptal, mid inferolateral, mid anterolateral, and apical lateral myocardium; mild hypokinesis of the mid-apical anterior myocardium. - Left atrium: The atrium was mildly dilated. - Atrial septum: There was increased thickness of the septum, consistent with lipomatous hypertrophy.   Assessment and Plan:  1.  Nonischemic cardiomyopathy, clinically stable on current medical regimen. No changes were made today. Recommended continue diet and exercise plan.  2.  Essential hypertension, blood pressure is reasonable well controlled today.  Current medicines were reviewed with the patient today.   Orders Placed This Encounter  Procedures  . EKG 12-Lead    Disposition: FU with me in 4 months.   Signed, Satira Sark, MD, [facility] 12/17/2014 12:01 PM    [facility] [place], [place], [place] [address] Phone: [phone]; Fax: [phone]

## 2015-04-08 ENCOUNTER — Ambulatory Visit (INDEPENDENT_AMBULATORY_CARE_PROVIDER_SITE_OTHER): Payer: BLUE CROSS/BLUE SHIELD | Admitting: Cardiology

## 2015-04-08 ENCOUNTER — Encounter: Payer: Self-pay | Admitting: Cardiology

## 2015-04-08 VITALS — BP 108/70 | HR 73 | Ht 68.0 in | Wt 182.0 lb

## 2015-04-08 DIAGNOSIS — I429 Cardiomyopathy, unspecified: Secondary | ICD-10-CM

## 2015-04-08 DIAGNOSIS — I428 Other cardiomyopathies: Secondary | ICD-10-CM

## 2015-04-08 DIAGNOSIS — I5022 Chronic systolic (congestive) heart failure: Secondary | ICD-10-CM

## 2015-04-08 DIAGNOSIS — I10 Essential (primary) hypertension: Secondary | ICD-10-CM

## 2015-04-08 NOTE — Patient Instructions (Signed)
Your physician wants you to follow-up in: 4 months with Dr Ferne Reus will receive a reminder letter in the mail two months in advance. If you don't receive a letter, please call our office to schedule the follow-up appointment.   Your physician recommends that you continue on your current medications as directed. Please refer to the Current Medication list given to you today.   If you need a refill on your cardiac medications before your next appointment, please call your pharmacy.   Your physician has requested that you have an echocardiogram. Echocardiography is a painless test that uses sound waves to create images of your heart. It provides your doctor with information about the size and shape of your heart and how well your heart's chambers and valves are working. This procedure takes approximately one hour. There are no restrictions for this procedure.    Thank you for choosing [facility] !

## 2015-04-08 NOTE — Progress Notes (Signed)
Cardiology Office Note  Date: 04/08/2015   ID: Corey Murphy, DOB May 20, 1950, MRN [id number]  PCP: Glo Herring., MD  Primary Cardiologist: Rozann Lesches, MD   Chief Complaint  Patient presents with  . Cardiomyopathy   History of Present Illness: Corey Murphy is a 64 y.o. male last seen in August. He is here today with his wife for a routine follow-up visit. Continues to do very well, NYHA class 1-2 dyspnea. He exercises 5 days a week at the gym as before. No angina symptoms, palpitations, or syncope.  We reviewed his medications are outlined below. There've been no changes in his cardiac regimen. His weight is down 5 pounds from last visit.  Echocardiogram from February 2015 is noted below. We discussed obtaining a follow-up study.  Past Medical History  Diagnosis Date  . Type 2 diabetes mellitus ([place])   . Essential hypertension, benign   . Hypercholesteremia   . Colonic adenoma 2006  . Chronic systolic CHF (congestive heart failure) ([place])   . Nonischemic cardiomyopathy ([facility])     LVEF approximately 40% 06/2013  . Hypertensive heart disease   . Alcohol dependency ([place])   . Obesity   . History of cardiac catheterization     03/2013 LM nl, LAD nl, D1/2/3 small - nl, LCX nondom - nl, OM1/2/3 nl, RCA dom    Current Outpatient Prescriptions  Medication Sig Dispense Refill  . aspirin 81 MG tablet Take 81 mg by mouth daily.    . carvedilol (COREG) 12.5 MG tablet Take 1 tablet (12.5 mg total) by mouth 2 (two) times daily. 60 tablet 6  . furosemide (LASIX) 20 MG tablet Take 1 tablet (20 mg total) by mouth as needed. 90 tablet 3  . glimepiride (AMARYL) 2 MG tablet Take 5 mg by mouth as directed. 1 tab am, 1 1/2 tab pm    . lisinopril (PRINIVIL,ZESTRIL) 2.5 MG tablet Take 2.5 mg by mouth daily.    . ONE TOUCH ULTRA TEST test strip     . simvastatin (ZOCOR) 20 MG tablet Take 20 mg by mouth every evening.    Marland Kitchen spironolactone (ALDACTONE) 25 MG tablet Take 12.5 mg by mouth  daily.    Marland Kitchen terbinafine (LAMISIL) 250 MG tablet Take 250 mg by mouth daily.     No current facility-administered medications for this visit.   Allergies:  Review of patient's allergies indicates no known allergies.   Social History: The patient  reports that he quit smoking about 6 years ago. His smoking use included Cigarettes. He has a 12 pack-year smoking history. He does not have any smokeless tobacco history on file. He reports that he drinks alcohol. He reports that he does not use illicit drugs.   ROS:  Please see the history of present illness. Otherwise, complete review of systems is positive for mild arthritic pains.  All other systems are reviewed and negative.   Physical Exam: VS:  BP 108/70 mmHg  Pulse 73  Ht 5\' 8"  (1.727 m)  Wt 182 lb (82.555 kg)  BMI 27.68 kg/m2  SpO2 97%, BMI Body mass index is 27.68 kg/(m^2).  Wt Readings from Last 3 Encounters:  04/08/15 182 lb (82.555 kg)  12/17/14 187 lb 12.8 oz (85.186 kg)  07/24/14 179 lb (81.194 kg)    Patient comfortable at rest.  HEENT: Conjunctiva and lids normal, oropharynx clear.  Neck: Supple, no elevated JVP or carotid bruits, no thyromegaly.  Lungs: Clear to auscultation, nonlabored breathing at rest.  Cardiac: Regular rate  and rhythm, no S3, no pericardial rub.  Abdomen: Soft, nontender, bowel sounds present, no guarding or rebound.  Extremities: No pitting edema, distal pulses 2+.   ECG: ECG is not ordered today.  Recent Labwork: 07/15/2014: BUN 24*; Creat 1.49*; Potassium 4.5; Sodium 139   Other Studies Reviewed Today:  Echocardiogram 06/04/2013: Study Conclusions  - Left ventricle: The cavity size was normal. Wall thickness was increased in a pattern of moderate LVH. Systolic function was moderately reduced. The estimated ejection fraction was in the range of 35% to 40%. There was an increased relative contribution of atrial contraction to ventricular filling. Doppler parameters are  consistent with abnormal left ventricular relaxation (grade 1 diastolic dysfunction). - Regional wall motion abnormality: Severe hypokinesis of the mid inferior myocardium; moderate hypokinesis of the mid inferoseptal, mid inferolateral, mid anterolateral, and apical lateral myocardium; mild hypokinesis of the mid-apical anterior myocardium. - Left atrium: The atrium was mildly dilated. - Atrial septum: There was increased thickness of the septum, consistent with lipomatous hypertrophy.  Assessment and Plan:  1. Nonischemic cardiomyopathy with LVEF approximately 40%. Plan to continue medical therapy, regular exercise as before. We will obtain a follow-up echocardiogram to reassess LVEF.  2. Essential hypertension, blood pressure is well controlled today.  Current medicines were reviewed with the patient today.   Orders Placed This Encounter  Procedures  . Echocardiogram    Disposition: FU with me in 4 months.   Signed, Satira Sark, MD, [facility] 04/08/2015 1:15 PM    [facility] at [facility] [address], [place], [place] [address] Phone: [phone]; Fax: [phone]

## 2015-04-27 ENCOUNTER — Ambulatory Visit (HOSPITAL_COMMUNITY)
Admission: RE | Admit: 2015-04-27 | Discharge: 2015-04-27 | Disposition: A | Discharge status: Home / Self Care | Payer: BLUE CROSS/BLUE SHIELD | Source: Ambulatory Visit | Attending: Cardiology | Admitting: Cardiology

## 2015-04-27 DIAGNOSIS — I5022 Chronic systolic (congestive) heart failure: Secondary | ICD-10-CM

## 2015-06-02 ENCOUNTER — Telehealth: Payer: Self-pay | Admitting: Oncology

## 2015-06-02 NOTE — Telephone Encounter (Signed)
Pt requested that he be seen in [place] and I have faxed over the new pt referral to Dr. Whitney Muse

## 2015-06-08 ENCOUNTER — Ambulatory Visit
Admission: RE | Admit: 2015-06-08 | Discharge: 2015-06-08 | Disposition: A | Discharge status: Home / Self Care | Payer: BLUE CROSS/BLUE SHIELD | Source: Ambulatory Visit | Attending: Nephrology | Admitting: Nephrology

## 2015-06-08 ENCOUNTER — Other Ambulatory Visit: Payer: Self-pay | Admitting: Nephrology

## 2015-06-08 DIAGNOSIS — R778 Other specified abnormalities of plasma proteins: Secondary | ICD-10-CM

## 2015-06-22 ENCOUNTER — Ambulatory Visit (HOSPITAL_COMMUNITY): Payer: BLUE CROSS/BLUE SHIELD | Admitting: Hematology & Oncology

## 2015-06-24 ENCOUNTER — Encounter (HOSPITAL_COMMUNITY): Payer: BLUE CROSS/BLUE SHIELD | Attending: Oncology | Admitting: Oncology

## 2015-06-24 ENCOUNTER — Encounter (HOSPITAL_COMMUNITY): Payer: Self-pay | Admitting: Oncology

## 2015-06-24 VITALS — BP 113/62 | HR 70 | Temp 98.1°F | Resp 16 | Ht 68.0 in | Wt 188.0 lb

## 2015-06-24 DIAGNOSIS — D472 Monoclonal gammopathy: Secondary | ICD-10-CM | POA: Diagnosis not present

## 2015-06-24 DIAGNOSIS — D631 Anemia in chronic kidney disease: Secondary | ICD-10-CM

## 2015-06-24 DIAGNOSIS — N183 Chronic kidney disease, stage 3 unspecified: Secondary | ICD-10-CM

## 2015-06-24 DIAGNOSIS — D649 Anemia, unspecified: Secondary | ICD-10-CM

## 2015-06-24 DIAGNOSIS — N189 Chronic kidney disease, unspecified: Secondary | ICD-10-CM | POA: Diagnosis not present

## 2015-06-24 HISTORY — DX: Monoclonal gammopathy: D47.2

## 2015-06-24 NOTE — Assessment & Plan Note (Addendum)
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  IgG of 1665, M-spike of 0.9, lambda light chain of 39.4, kappa light chain of 25.82, K/L ratio of 0.66 (norm) with Stage III chronic renal disease, normal calcium, and trace anemia with Hgb of 12.8 g/dL.  He has known reasons for chronic renal disease, namely diabetes and hypertension. However, unclear whether the patient's recently diagnosed monoclonal protein has played a part in his renal disease. I do not see where there is been a kidney biopsy. As a result, it would be prudent to evaluate the patient further for multiple myeloma. However, given the patient's laboratory work, radiographic studies, and the lack of symptoms, this diagnosis is less likely.  Indications for bone marrow aspiration and biopsy in this particular situation is as follows: A bone marrow aspiration and biopsy is indicated in all patients with an M protein ?1.5 g/dL, patients with IgA MGUS of any size, patients with an abnormal serum free light chain ratio (ie, ratio of kappa to lambda free light chains <0.26 or >1.65), and in all patients who have any abnormalities of the complete blood count (CBC), serum creatinine, serum calcium, or radiographic bone survey. This practice is supported by a study of 1271 patients with MGUS or multiple myeloma with minimal bone pain (grade 0/1) whose initial work-up included bone marrow evaluation and skeletal survey (without serum free light chain analysis) with the following results:   ?Among patients with an M-protein ?1.5 g/dL, the percentage of patients with bone marrow plasma cell infiltration >10 percent was 7.3 percent overall, but ranged from 4.7 percent to 20 percent in those with IgG and IgA isotypes, respectively.   ?For those with an IgG M-protein <0.5 g/dL, <1 percent had bone marrow plasma cell infiltration >10 percent by morphology.   ?Similarly, among those with an M protein ?1.5 g/dL, the  percentage of patients with bone lesions on skeletal survey was <2 percent, irrespective of isotype.  These results support the omission of the bone marrow evaluation in patients with IgG type MGUS with serum M protein <1.5 g/dL, a normal serum FLC ratio, and with no bone pain or clinical concern for myeloma. Bone marrow may also be deferred in older asymptomatic adults or in frail older adults with limited life expectancy in whom myeloma or a related malignancy is considered unlikely, who can be safely followed. Although data are not available to support this approach, we also often defer a bone marrow biopsy in patients with IgM MGUS with a small M protein (<1.5 g/dL), normal serum FLC ratio, with no evidence of anemia, lymphadenopathy, or organomegaly.   Given the patient's trace anemia and renal function, we will pursue a bone marrow aspiration and biopsy by interventional radiology is ordered.  He is provided a wealth of information regarding a bone marrow aspiration and biopsy. He understands that based upon his clinical findings, his risk of multiple myeloma being shown on bone marrow aspiration and biopsy is approximately 10% as noted above. 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 He is given some educational reading material about MGUS.  I went over the the procedure of a bone marrow aspiration and biopsy.  I explained that we prep and cleanse the skin to decrease the risk of infection.  We then numb the skin superficial to the PSIS.  This will likely cause a stinging sensation that is short-lived.  After numbing the skin, a tract all the way to the bone or PSIS is numbed.  After waiting a few moments for the lidocaine to take full effect, a needle is inserted through the numbed tract and through the bone.  Once through the bone, an aspirate is performed.  A "shock" sensation will be felt down the lower extremity of the side of the hip that the procedure is being performed on. This is caused by the negative pressure  created by the pulling back of the syringe to obtain blood from the bone marrow.  When this blood is collected and the negative pressure is stopped, the "shock" sensation subsides.  Then a biopsy is performed of the bone and its marrow.  The products from this procedure are then analyzed and the results are reported 1-2 weeks later. Following the procedure, a pressure bandage will be applied to the site and the patient will remain in the procedure room for 30 minutes. The patient will then be discharged.  The risks, benefits, and alternatives of the procedure were explained.  The risks include infection, pain, minimal blood loss, and discomfort following the procedure.  The patient expresses understanding and asks appropriate questions.  All questions regarding the procedure were answered.  Labs in ~ 1 week: CBC diff, CMET, LDH, ESR, CRP, SPEP + IFE, light chain assay, B2M  Labs in ~ 1 week for trace anemia: anemia panel, retic count, EPO level.  Return 1-2 weeks following bone marrow aspiration and biopsy.

## 2015-06-24 NOTE — Progress Notes (Signed)
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  I personally reviewed and went over laboratory results with the patient.  The results are noted within this dictation. 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   I personally reviewed and went over radiographic studies with the patient.  The results are noted within this dictation.  Relevant imaging that was performed on 06/08/2015 includes a metastatic bone survey. Results are negative for any radiolucent lesions.  Chart reviewed. The patient reports that his primary care physician referred him to Corey Murphy who is a nephrologist. He is referred for chronic kidney disease and evaluation and management of this. Part of the workup included a multiple myeloma screen but did find an IgG monoclonal protein with a small M spike of 0.9. Therefore, the patient was referred to oncology clinic for further evaluation and workup.  The patient denies any complaints he reports that he has a history of  congestive heart failure. Since that time, he lost approximately 50 pounds. He's making healthier eating choices. He is also working out. He also reports a history of bilateral total hip arthroplasties in the past. This was performed by Corey Murphy.  The patient denies any headaches, dizziness, double vision, fevers, chills, night sweats, nausea, vomiting, change in appetite, unintentional weight loss, chest pain, shortness of breath, blood in stool, black sticky stools, blood in urine, urinary pain/burning/frequency.   PAST MEDICAL HISTORY:   Past Medical History  Diagnosis Date  . Type 2 diabetes mellitus (Corey Murphy)   . Essential hypertension, benign   . Hypercholesteremia   . Colonic adenoma 2006  . Chronic systolic CHF (congestive heart failure) ([place])   . Nonischemic cardiomyopathy ([facility])     LVEF approximately 40% 06/2013  . Hypertensive heart disease   . Alcohol dependency ([place])   . Obesity   . History of cardiac catheterization     03/2013 LM nl, LAD nl, D1/2/3 small - nl, LCX nondom - nl, OM1/2/3 nl, RCA dom  . 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    Current Outpatient Prescriptions on File Prior to Visit  Medication Sig Dispense Refill  . aspirin 81 MG tablet Take 81 mg by mouth daily.    . carvedilol (COREG) 12.5 MG tablet Take 1 tablet (12.5 mg total) by mouth 2 (two) times daily. 60 tablet 6  . furosemide (LASIX) 20 MG tablet Take 1 tablet (20 mg total) by mouth as needed. [place]  tablet 3  . glimepiride (AMARYL) 2 MG tablet Take 5 mg by mouth as directed. 1 tab am, 1 1/2 tab pm    . lisinopril (PRINIVIL,ZESTRIL) 2.5 MG tablet Take 2.5 mg by mouth daily.    . ONE TOUCH ULTRA TEST test strip     . spironolactone (ALDACTONE) 25 MG tablet Take 12.5 mg by mouth daily.    . simvastatin (ZOCOR) 20 MG tablet Take 20 mg by mouth every evening. Reported on 06/24/2015    .  terbinafine (LAMISIL) 250 MG tablet Take 250 mg by mouth daily. Reported on 06/24/2015     No current facility-administered medications on file prior to visit.     PAST SURGICAL HISTORY Past Surgical History  Procedure Laterality Date  . Colonoscopy  05/20/2004    Corey Murphy: Internal hemorrhoids.  Diminutive adenomatous polyp in the mid-right colon, otherwise normal  . Ileocolonoscopy  06/28/2007    Corey Murphy rectum/Submucosal sigmoid diverticula (chronic), doubt clinical significance Hyperplastic polypoid-appearing fold, mid ascending colon, status/Remainder colonic mucosa and terminal ileum mucosa appeared normal   . Total hip arthroplasty  2010    Left  . Total hip arthroplasty  2010    Right  . Colonoscopy N/A 06/19/2012    Procedure: COLONOSCOPY;  Surgeon: Corey Dolin, MD;  Location: [facility];  Service: Endoscopy;  Laterality: N/A;  8:30AM-rescheduled 10:30am Corey Murphy notified pt  . Left and right heart catheterization with coronary angiogram  03/19/2013    Procedure: LEFT AND RIGHT HEART CATHETERIZATION WITH CORONARY ANGIOGRAM;  Surgeon: Corey Hampshire, MD;  Location: Corey Murphy;  Service: Cardiovascular;;    FAMILY HISTORY: Family History  Problem Relation Age of Onset  . Colon cancer Neg Hx   . Heart failure Mother   . Diabetes Mother   . Hypertension Mother   . Heart failure Sister   . Hypertension Sister   . Cancer Sister   Mothers deceased at the age of 27 secondary to renal failure, refusing hemodialysis Father is alive at the age of 68. Patient reports she's had a nephrectomy. Otherwise healthy. Patient has 3 sisters, one of these is deceased in her 23s from some cancer (he is unsure of what cancer). His other 2 sisters are healthy other than one being obese. He has 2 children, 2 sons who are ages 95 and 56 respectively.  SOCIAL HISTORY:  reports that he quit smoking about 7 years ago. His smoking use included Cigarettes. He has a 12 pack-year smoking history.  He does not have any smokeless tobacco history on file. He reports that he uses illicit drugs (Marijuana). He reports that he does not drink alcohol.  He reports that he used to smoke tobacco partially one half pack per day for about 17 years. This equates nearly a 9 year packing history. He quit in 2008. He admits to a history of drinking alcohol. He would drink on weekends only. He reports that he go through approximately 24 beers per weekend. He admits to rare marijuana usage. He smokes it via a bowl. He is married 75 years. 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  PERFORMANCE STATUS: The patient's performance status is 0 - Asymptomatic  PHYSICAL EXAM: Most Recent Vital Signs: Blood pressure 113/62, pulse 70, temperature 98.1 F (36.7 C), temperature source  Oral, resp. rate 16, height 5' 8"  (1.727 m), weight 188 lb (85.276 kg), SpO2 100 %. BP 113/62 mmHg  Pulse 70  Temp(Src) 98.1 F (36.7 C) (Oral)  Resp 16  Ht 5' 8"  (1.727 m)  Wt 188 lb (85.276 kg)  BMI 28.59 kg/m2  SpO2 100%  General Appearance:    Alert, cooperative, no distress, appears stated age, fit man  Head:    Normocephalic, without obvious abnormality, atraumatic  Eyes:    PERRL, conjunctiva/corneas clear, EOM's intact, fundi    benign, both eyes       Ears:    Normal TM's and external ear canals, both ears  Nose:   Nares normal, septum midline, mucosa normal, no drainage    or sinus tenderness  Throat:   Lips, mucosa, and tongue normal; teeth and gums normal  Neck:   Supple, symmetrical, trachea midline, no adenopathy;       thyroid:  No enlargement/tenderness/nodules; no carotid   bruit or JVD  Back:     Symmetric, no curvature, ROM normal, no CVA tenderness  Lungs:     Clear to auscultation bilaterally, respirations unlabored  Chest wall:     No tenderness or deformity  Heart:    Regular rate and rhythm, S1 and S2 normal, no murmur, rub   or gallop  Abdomen:     Soft, non-tender, bowel sounds active all four quadrants,    no masses, no organomegaly        Extremities:   Extremities normal, atraumatic, no cyanosis or edema  Pulses:   2+ and symmetric all extremities  Skin:   Skin color, texture, turgor normal, no rashes. Small hyperpigmented lesion measuring 4 mm in size in the LLQ of abdomen that is raised without any irregular pigmentation or borders.  Lymph nodes:   Cervical, supraclavicular, and axillary nodes normal  Neurologic:   CNII-XII intact. 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    RADIOGRAPHY:   CLINICAL DATA: Abnormal serum protein electrophoresis  EXAM: METASTATIC BONE SURVEY  COMPARISON: None.  FINDINGS: A lateral view the skull shows no radiolucent lesions. No bony abnormality is seen.  Two views of the cervical spine show the cervical vertebrae to be in normal alignment. There is degenerative disc disease at C5-6 and C6-7 levels. No compression deformity is seen.  The thoracic vertebrae are in normal alignment with only mild degenerative spurring. No compression deformity is seen. No prominent paravertebral soft tissue is noted.  The lumbar vertebrae are in normal alignment with mild degenerative disc disease diffusely. No compression deformity is seen.  A view of the pelvis shows bilateral total hip replacements to be present. No complicating features are seen. The pelvic rami are intact. The SI joints are  corticated.  Views of both the right and left shoulder and humerus show degenerative changes with downsloping acromion which can predispose to impingement. However no radiolucent lesions are seen.  The lungs are clear. Mediastinal and hilar contours are unremarkable. The heart is within upper limits of normal.  Views of both the right and left femur, and lower legs show no radiolucent lesions. No significant  degenerative change is noted.  Views of both forearms show no radiolucent lesions and no acute abnormality.  IMPRESSION: 1. No radiolucent lesions are seen. 2. There are degenerative changes in the cervical, thoracic, and lumbar spine. 3. No active lung disease. 4. Bilateral downward sloping acromions may result in impingement. Correlate clinically.   Electronically Signed  By: Ivar Drape M.D.  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  IgG of 1665, M-spike of 0.9, lambda light chain of 39.4, kappa light chain of 25.82, K/L ratio of 0.66 (norm) with Stage III chronic renal disease, normal calcium, and  anemia with Hgb of 12.8 g/dL.  He has known reasons for chronic renal disease, namely diabetes and hypertension. However, unclear whether the patient's recently diagnosed monoclonal protein has played a part in his renal disease. I do not see where there is been a kidney biopsy. As a result, I would evaluate the patient further for multiple myeloma. However, given the patient's laboratory work, radiographic studies, and the lack of symptoms, this diagnosis is less likely.  Indications for bone marrow aspiration and biopsy in this particular situation is as follows: (from  Up to date) A bone marrow aspiration and biopsy is indicated in all patients with an M protein ?1.5 g/dL, patients with IgA MGUS of  any size, patients with an abnormal serum free light chain ratio (ie, ratio of kappa to lambda free light chains <0.26 or >1.65), and in all patients who have any abnormalities of the complete blood count (CBC), serum creatinine, serum calcium, or radiographic bone survey. This practice is supported by a study of 1271 patients with MGUS or multiple myeloma with minimal bone pain (grade 0/1) whose initial work-up included bone marrow evaluation and skeletal survey (without serum free light chain analysis) with the following results:   ?Among patients with an M-protein ?1.5 g/dL, the percentage of patients with bone marrow plasma cell infiltration >10 percent was 7.3 percent overall, but ranged from 4.7 percent to 20 percent in those with IgG and IgA isotypes, respectively.   ?For those with an IgG M-protein <0.5 g/dL, <1 percent had bone marrow plasma cell infiltration >10 percent by morphology.   ?Similarly, among those with an M protein ?1.5 g/dL, the percentage of patients with bone lesions on skeletal survey was <2 percent, irrespective of isotype.  These results support the omission of the bone marrow evaluation in patients with IgG type MGUS with serum M protein <1.5 g/dL, a normal serum FLC ratio, and with no bone pain or clinical concern for myeloma. Bone marrow may also be deferred in older asymptomatic adults or in frail older adults with limited life expectancy in whom myeloma or a related malignancy is considered unlikely, who can be safely followed. Although data are not available to support this approach, we also often defer a bone marrow biopsy in patients with IgM MGUS with a small M protein (<1.5 g/dL), normal serum FLC ratio, with no evidence of anemia, lymphadenopathy, or organomegaly.   Given the patient's  anemia and renal function, we will pursue a bone marrow aspiration and biopsy by interventional radiology is ordered.  He is provided a wealth of information regarding a bone marrow  aspiration and biopsy. He understands that based upon his clinical findings, his risk of multiple myeloma being shown on bone marrow aspiration  and biopsy is approximately 10% as noted above. 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 He is given some educational reading material about MGUS.  I went over the the procedure of a bone marrow aspiration and biopsy.The risks, benefits, and alternatives of the procedure were explained.  The risks include infection, pain, minimal blood loss, and discomfort following the procedure.  The patient expresses understanding and asks appropriate questions.  All questions regarding the procedure were answered.   All questions were answered. The patient knows to call the clinic with any problems, questions or concerns. We can certainly see the patient much sooner if necessary.    This note is electronically signed by: Molli Hazard, MD  06/24/2015 6:24 PM

## 2015-06-24 NOTE — Patient Instructions (Addendum)
[place] at [facility] Discharge Instructions  RECOMMENDATIONS MADE BY THE CONSULTANT AND ANY TEST RESULTS WILL BE SENT TO YOUR REFERRING PHYSICIAN.  You were seen at the [facility] for evaluation of a minimally abnormal blood test that was performed by your kidney doctor.  As a result, we need to perform some addition tests including blood work and a bone marrow aspiration and biopsy. We will perform some lab work another day and this will be scheduled with the lab on the main floor of [facility]. Your bone marrow test will be performed in [place] at [facility] with a department called Interventional Radiology. You will return for follow-up to review bone marrow test and lab results.  At that time, we will provide you information regarding any diagnosis that may be found at that time.  Thank you for choosing [place] at [facility] to provide your oncology and hematology care.  To afford each patient quality time with our provider, please arrive at least 15 minutes before your scheduled appointment time.   Beginning January 23rd 2017 lab work for the [facility] will be done in the  Main lab at [facility] on 1st floor. If you have a lab appointment with the [place] please come in thru the  Main Entrance and check in at the main information desk  You need to re-schedule your appointment should you arrive 10 or more minutes late.  We strive to give you quality time with our providers, and arriving late affects you and other patients whose appointments are after yours.  Also, if you no show three or more times for appointments you may be dismissed from the clinic at the providers discretion.     Again, thank you for choosing [facility].  Our hope is that these requests will decrease the amount of time that you wait before being seen by our physicians.        _____________________________________________________________  Should you have questions after your visit to [facility], please contact our office at [phone] between the hours of 8:30 a.m. and 4:30 p.m.  Voicemails left after 4:30 p.m. will not be returned until the following business day.  For prescription refill requests, have your pharmacy contact our office.

## 2015-07-05 ENCOUNTER — Other Ambulatory Visit (HOSPITAL_COMMUNITY): Payer: Self-pay | Admitting: Oncology

## 2015-07-05 ENCOUNTER — Encounter (HOSPITAL_COMMUNITY): Payer: BLUE CROSS/BLUE SHIELD | Attending: Oncology

## 2015-07-05 DIAGNOSIS — D472 Monoclonal gammopathy: Secondary | ICD-10-CM | POA: Diagnosis present

## 2015-07-05 DIAGNOSIS — C9000 Multiple myeloma not having achieved remission: Secondary | ICD-10-CM | POA: Diagnosis present

## 2015-07-05 LAB — COMPREHENSIVE METABOLIC PANEL
ALBUMIN: 3.8 g/dL (ref 3.5–5.0)
ALK PHOS: 72 U/L (ref 38–126)
ALT: 29 U/L (ref 17–63)
ANION GAP: 5 (ref 5–15)
AST: 17 U/L (ref 15–41)
BILIRUBIN TOTAL: 0.4 mg/dL (ref 0.3–1.2)
BUN: 27 mg/dL — AB (ref 6–20)
CALCIUM: 9.1 mg/dL (ref 8.9–10.3)
CO2: 27 mmol/L (ref 22–32)
CREATININE: 1.58 mg/dL — AB (ref 0.61–1.24)
Chloride: 105 mmol/L (ref 101–111)
GFR calc Af Amer: 52 mL/min — ABNORMAL LOW (ref >60)
GFR calc non Af Amer: 45 mL/min — ABNORMAL LOW (ref >60)
GLUCOSE: 116 mg/dL — AB (ref 65–99)
Potassium: 4.3 mmol/L (ref 3.5–5.1)
SODIUM: 137 mmol/L (ref 135–145)
TOTAL PROTEIN: 7.3 g/dL (ref 6.5–8.1)

## 2015-07-05 LAB — CBC WITH DIFFERENTIAL/PLATELET
BASOS ABS: 0 10*3/uL (ref 0.0–0.1)
Basophils Relative: 0 %
EOS PCT: 2 %
Eosinophils Absolute: 0.1 10*3/uL (ref 0.0–0.7)
HCT: 36.7 % — ABNORMAL LOW (ref 39.0–52.0)
Hemoglobin: 12.2 g/dL — ABNORMAL LOW (ref 13.0–17.0)
LYMPHS PCT: 46 %
Lymphs Abs: 2.2 10*3/uL (ref 0.7–4.0)
MCH: 30.8 pg (ref 26.0–34.0)
MCHC: 33.2 g/dL (ref 30.0–36.0)
MCV: 92.7 fL (ref 78.0–100.0)
MONO ABS: 0.4 10*3/uL (ref 0.1–1.0)
Monocytes Relative: 8 %
Neutro Abs: 2.1 10*3/uL (ref 1.7–7.7)
Neutrophils Relative %: 44 %
Platelets: 283 10*3/uL (ref 150–400)
RBC: 3.96 MIL/uL — ABNORMAL LOW (ref 4.22–5.81)
RDW: 13.7 % (ref 11.5–15.5)
WBC: 4.8 10*3/uL (ref 4.0–10.5)

## 2015-07-05 LAB — FERRITIN: FERRITIN: 79 ng/mL (ref 24–336)

## 2015-07-05 LAB — IRON AND TIBC
Iron: 92 ug/dL (ref 45–182)
SATURATION RATIOS: 24 % (ref 17.9–39.5)
TIBC: 381 ug/dL (ref 250–450)
UIBC: 289 ug/dL

## 2015-07-05 LAB — LACTATE DEHYDROGENASE: LDH: 91 U/L — ABNORMAL LOW (ref 98–192)

## 2015-07-05 LAB — RETICULOCYTES
RBC.: 3.96 MIL/uL — AB (ref 4.22–5.81)
RETIC CT PCT: 1.2 % (ref 0.4–3.1)
Retic Count, Absolute: 47.5 10*3/uL (ref 19.0–186.0)

## 2015-07-05 LAB — SEDIMENTATION RATE: Sed Rate: 12 mm/hr (ref 0–16)

## 2015-07-05 LAB — FOLATE: Folate: 39.5 ng/mL (ref >5.9)

## 2015-07-05 LAB — C-REACTIVE PROTEIN

## 2015-07-05 LAB — VITAMIN B12: Vitamin B-12: 458 pg/mL (ref 180–914)

## 2015-07-06 ENCOUNTER — Other Ambulatory Visit: Payer: Self-pay | Admitting: Radiology

## 2015-07-06 LAB — IGG, IGA, IGM
IgA: 81 mg/dL (ref 61–437)
IgG (Immunoglobin G), Serum: 1466 mg/dL (ref 700–1600)
IgM, Serum: 34 mg/dL (ref 20–172)

## 2015-07-06 LAB — PROTEIN ELECTROPHORESIS, SERUM
A/G Ratio: 1 (ref 0.7–1.7)
ALBUMIN ELP: 3.6 g/dL (ref 2.9–4.4)
Alpha-1-Globulin: 0.2 g/dL (ref 0.0–0.4)
Alpha-2-Globulin: 0.7 g/dL (ref 0.4–1.0)
BETA GLOBULIN: 1.3 g/dL (ref 0.7–1.3)
GAMMA GLOBULIN: 1.4 g/dL (ref 0.4–1.8)
Globulin, Total: 3.5 g/dL (ref 2.2–3.9)
M-Spike, %: 0.8 g/dL — ABNORMAL HIGH
TOTAL PROTEIN ELP: 7.1 g/dL (ref 6.0–8.5)

## 2015-07-06 LAB — IMMUNOFIXATION ELECTROPHORESIS
IGG (IMMUNOGLOBIN G), SERUM: 1430 mg/dL (ref 700–1600)
IGM, SERUM: 35 mg/dL (ref 20–172)
IgA: 85 mg/dL (ref 61–437)
TOTAL PROTEIN ELP: 6.9 g/dL (ref 6.0–8.5)

## 2015-07-06 LAB — KAPPA/LAMBDA LIGHT CHAINS
KAPPA FREE LGHT CHN: 26.96 mg/L — AB (ref 3.30–19.40)
Kappa, lambda light chain ratio: 0.66 (ref 0.26–1.65)
LAMDA FREE LIGHT CHAINS: 40.83 mg/L — AB (ref 5.71–26.30)

## 2015-07-06 LAB — BETA 2 MICROGLOBULIN, SERUM: BETA 2 MICROGLOBULIN: 1.6 mg/L (ref 0.6–2.4)

## 2015-07-06 LAB — ERYTHROPOIETIN: ERYTHROPOIETIN: 15.2 m[IU]/mL (ref 2.6–18.5)

## 2015-07-07 ENCOUNTER — Other Ambulatory Visit: Payer: Self-pay | Admitting: Radiology

## 2015-07-08 ENCOUNTER — Encounter (HOSPITAL_COMMUNITY): Payer: Self-pay

## 2015-07-08 ENCOUNTER — Other Ambulatory Visit: Payer: Self-pay | Admitting: Interventional Radiology

## 2015-07-08 ENCOUNTER — Ambulatory Visit (HOSPITAL_COMMUNITY)
Admission: RE | Admit: 2015-07-08 | Discharge: 2015-07-08 | Disposition: A | Discharge status: Home / Self Care | Payer: BLUE CROSS/BLUE SHIELD | Source: Ambulatory Visit | Attending: Oncology | Admitting: Oncology

## 2015-07-08 DIAGNOSIS — I5022 Chronic systolic (congestive) heart failure: Secondary | ICD-10-CM | POA: Diagnosis not present

## 2015-07-08 DIAGNOSIS — E1122 Type 2 diabetes mellitus with diabetic chronic kidney disease: Secondary | ICD-10-CM | POA: Insufficient documentation

## 2015-07-08 DIAGNOSIS — Z96643 Presence of artificial hip joint, bilateral: Secondary | ICD-10-CM | POA: Diagnosis not present

## 2015-07-08 DIAGNOSIS — I130 Hypertensive heart and chronic kidney disease with heart failure and stage 1 through stage 4 chronic kidney disease, or unspecified chronic kidney disease: Secondary | ICD-10-CM | POA: Insufficient documentation

## 2015-07-08 DIAGNOSIS — N189 Chronic kidney disease, unspecified: Secondary | ICD-10-CM | POA: Insufficient documentation

## 2015-07-08 DIAGNOSIS — D472 Monoclonal gammopathy: Secondary | ICD-10-CM | POA: Insufficient documentation

## 2015-07-08 DIAGNOSIS — Z8249 Family history of ischemic heart disease and other diseases of the circulatory system: Secondary | ICD-10-CM | POA: Insufficient documentation

## 2015-07-08 DIAGNOSIS — I428 Other cardiomyopathies: Secondary | ICD-10-CM | POA: Insufficient documentation

## 2015-07-08 DIAGNOSIS — E7800 Pure hypercholesterolemia, unspecified: Secondary | ICD-10-CM | POA: Diagnosis not present

## 2015-07-08 DIAGNOSIS — Z833 Family history of diabetes mellitus: Secondary | ICD-10-CM | POA: Insufficient documentation

## 2015-07-08 DIAGNOSIS — Z79899 Other long term (current) drug therapy: Secondary | ICD-10-CM | POA: Insufficient documentation

## 2015-07-08 DIAGNOSIS — Z7982 Long term (current) use of aspirin: Secondary | ICD-10-CM | POA: Insufficient documentation

## 2015-07-08 DIAGNOSIS — Z7984 Long term (current) use of oral hypoglycemic drugs: Secondary | ICD-10-CM | POA: Insufficient documentation

## 2015-07-08 DIAGNOSIS — E669 Obesity, unspecified: Secondary | ICD-10-CM | POA: Diagnosis not present

## 2015-07-08 DIAGNOSIS — D649 Anemia, unspecified: Secondary | ICD-10-CM | POA: Diagnosis not present

## 2015-07-08 DIAGNOSIS — D72822 Plasmacytosis: Secondary | ICD-10-CM | POA: Diagnosis not present

## 2015-07-08 DIAGNOSIS — Z87891 Personal history of nicotine dependence: Secondary | ICD-10-CM | POA: Insufficient documentation

## 2015-07-08 LAB — GLUCOSE, CAPILLARY
GLUCOSE-CAPILLARY: 161 mg/dL — AB (ref 65–99)
Glucose-Capillary: 94 mg/dL (ref 65–99)

## 2015-07-08 LAB — CBC WITH DIFFERENTIAL/PLATELET
BASOS ABS: 0 10*3/uL (ref 0.0–0.1)
BASOS PCT: 1 %
EOS ABS: 0.1 10*3/uL (ref 0.0–0.7)
Eosinophils Relative: 2 %
HCT: 35.6 % — ABNORMAL LOW (ref 39.0–52.0)
HEMOGLOBIN: 11.5 g/dL — AB (ref 13.0–17.0)
Lymphocytes Relative: 41 %
Lymphs Abs: 2.3 10*3/uL (ref 0.7–4.0)
MCH: 30.4 pg (ref 26.0–34.0)
MCHC: 32.3 g/dL (ref 30.0–36.0)
MCV: 94.2 fL (ref 78.0–100.0)
MONOS PCT: 11 %
Monocytes Absolute: 0.6 10*3/uL (ref 0.1–1.0)
NEUTROS PCT: 45 %
Neutro Abs: 2.6 10*3/uL (ref 1.7–7.7)
Platelets: 307 10*3/uL (ref 150–400)
RBC: 3.78 MIL/uL — AB (ref 4.22–5.81)
RDW: 13.8 % (ref 11.5–15.5)
WBC: 5.7 10*3/uL (ref 4.0–10.5)

## 2015-07-08 LAB — PROTIME-INR
INR: 1.05 (ref 0.00–1.49)
PROTHROMBIN TIME: 13.4 s (ref 11.6–15.2)

## 2015-07-08 LAB — BONE MARROW EXAM

## 2015-07-08 MED ORDER — FENTANYL CITRATE (PF) 100 MCG/2ML IJ SOLN
INTRAMUSCULAR | Status: AC | PRN
  Administered 2015-07-08: 50 ug via INTRAVENOUS

## 2015-07-08 MED ORDER — HYDROCODONE-ACETAMINOPHEN 5-325 MG PO TABS: 1.0000 | ORAL_TABLET | ORAL | Status: DC | PRN

## 2015-07-08 MED ORDER — SODIUM CHLORIDE 0.9 % IV SOLN
INTRAVENOUS | Status: DC
  Administered 2015-07-08: 07:00:00 via INTRAVENOUS

## 2015-07-08 MED ORDER — MIDAZOLAM HCL 2 MG/2ML IJ SOLN
INTRAMUSCULAR | Status: AC
  Filled 2015-07-08: qty 6

## 2015-07-08 MED ORDER — FENTANYL CITRATE (PF) 100 MCG/2ML IJ SOLN
INTRAMUSCULAR | Status: AC
  Filled 2015-07-08: qty 4

## 2015-07-08 MED ORDER — MIDAZOLAM HCL 2 MG/2ML IJ SOLN
INTRAMUSCULAR | Status: AC | PRN
  Administered 2015-07-08 (×2): 1 mg via INTRAVENOUS
  Administered 2015-07-08: 0.5 mg via INTRAVENOUS

## 2015-07-08 NOTE — Sedation Documentation (Signed)
Patient denies pain and is resting comfortably.  

## 2015-07-08 NOTE — Procedures (Signed)
CT-guided  R iliac bone marrow aspiration and core biopsy No complication No blood loss. See complete dictation in [place] PACS  

## 2015-07-08 NOTE — Discharge Instructions (Signed)
Bone Marrow Aspiration and Bone Marrow Biopsy °Bone marrow aspiration and bone marrow biopsy are procedures that are done to diagnose blood disorders. You may also have one of these procedures to help diagnose infections or some types of cancer. °Bone marrow is the soft tissue that is inside your bones. Blood cells are produced in bone marrow. For bone marrow aspiration, a sample of tissue in liquid form is removed from inside your bone. For a bone marrow biopsy, a small core of bone marrow tissue is removed. Then these samples are examined under a microscope or tested in a lab. °You may need these procedures if you have an abnormal complete blood count (CBC). The aspiration or biopsy sample is usually taken from the top of your hip bone. Sometimes, an aspiration sample is taken from your chest bone (sternum). °LET YOUR HEALTH CARE PROVIDER KNOW ABOUT: °· Any allergies you have. °· All medicines you are taking, including vitamins, herbs, eye drops, creams, and over-the-counter medicines. °· Previous problems you or members of your family have had with the use of anesthetics. °· Any blood disorders you have. °· Previous surgeries you have had. °· Any medical conditions you may have. °· Whether you are pregnant or you think that you may be pregnant. °RISKS AND COMPLICATIONS °Generally, this is a safe procedure. However, problems may occur, including: °· Infection. °· Bleeding. °BEFORE THE PROCEDURE °· Ask your health care provider about: °¨ Changing or stopping your regular medicines. This is especially important if you are taking diabetes medicines or blood thinners. °¨ Taking medicines such as aspirin and ibuprofen. These medicines can thin your blood. Do not take these medicines before your procedure if your health care provider instructs you not to. °· Plan to have someone take you home after the procedure. °· If you go home right after the procedure, plan to have someone with you for 24 hours. °PROCEDURE  °· An  IV tube may be inserted into one of your veins. °· The injection site will be cleaned with a germ-killing solution (antiseptic). °· You will be given one or more of the following: °¨ A medicine that helps you relax (sedative). °¨ A medicine that numbs the area (local anesthetic). °· The bone marrow sample will be removed as follows: °¨ For an aspiration, a hollow needle will be inserted through your skin and into your bone. Bone marrow fluid will be drawn up into a syringe. °¨ For a biopsy, your health care provider will use a hollow needle to remove a core of tissue from your bone marrow. °· The needle will be removed. °· A bandage (dressing) will be placed over the insertion site and taped in place. °The procedure may vary among health care providers and hospitals. °AFTER THE PROCEDURE °· Your blood pressure, heart rate, breathing rate, and blood oxygen level will be monitored often until the medicines you were given have worn off. °· Return to your normal activities as directed by your health care provider. °  °This information is not intended to replace advice given to you by your health care provider. Make sure you discuss any questions you have with your health care provider. °  °Document Released: 04/20/2004 Document Revised: 09/01/2014 Document Reviewed: 04/08/2014 °Elsevier Interactive Patient Education ©2016 Elsevier Inc[employer] °Bone Marrow Aspiration and Bone Marrow Biopsy, Care After °Refer to this sheet in the next few weeks. These instructions provide you with information about caring for yourself after your procedure. Your health care provider may also give you   more specific instructions. Your treatment has been planned according to current medical practices, but problems sometimes occur. Call your health care provider if you have any problems or questions after your procedure. WHAT TO EXPECT AFTER THE PROCEDURE After your procedure, it is common to have:  Soreness or tenderness around the puncture  site.  Bruising. HOME CARE INSTRUCTIONS  Take medicines only as directed by your health care provider.  Follow your health care provider's instructions about:  Puncture site care.  Bandage (dressing) changes and removal.  Bathe and shower as directed by your health care provider.  Check your puncture site every day for signs of infection. Watch for:  Redness, swelling, or pain.  Fluid, blood, or pus.  Return to your normal activities as directed by your health care provider.  Keep all follow-up visits as directed by your health care provider. This is important. SEEK MEDICAL CARE IF:  You have a fever.  You have uncontrollable bleeding.  You have redness, swelling, or pain at the site of your puncture.  You have fluid, blood, or pus coming from your puncture site.   This information is not intended to replace advice given to you by your health care provider. Make sure you discuss any questions you have with your health care provider.   Document Released: 11/04/2004 Document Revised: 09/01/2014 Document Reviewed: 04/08/2014 Elsevier Interactive Patient Education 2016 Elsevier Inc. Moderate Conscious Sedation, Adult Sedation is the use of medicines to promote relaxation and relieve discomfort and anxiety. Moderate conscious sedation is a type of sedation. Under moderate conscious sedation you are less alert than normal but are still able to respond to instructions or stimulation. Moderate conscious sedation is used during short medical and dental procedures. It is milder than deep sedation or general anesthesia and allows you to return to your regular activities sooner. LET [facility] CARE PROVIDER KNOW ABOUT:   Any allergies you have.  All medicines you are taking, including vitamins, herbs, eye drops, creams, and over-the-counter medicines.  Use of steroids (by mouth or creams).  Previous problems you or members of your family have had with the use of  anesthetics.  Any blood disorders you have.  Previous surgeries you have had.  Medical conditions you have.  Possibility of pregnancy, if this applies.  Use of cigarettes, alcohol, or illegal drugs. RISKS AND COMPLICATIONS Generally, this is a safe procedure. However, as with any procedure, problems can occur. Possible problems include:  Oversedation.  Trouble breathing on your own. You may need to have a breathing tube until you are awake and breathing on your own.  Allergic reaction to any of the medicines used for the procedure. BEFORE THE PROCEDURE  You may have blood tests done. These tests can help show how well your kidneys and liver are working. They can also show how well your blood clots.  A physical exam will be done.  Only take medicines as directed by your health care provider. You may need to stop taking medicines (such as blood thinners, aspirin, or nonsteroidal anti-inflammatory drugs) before the procedure.   Do not eat or drink at least 6 hours before the procedure or as directed by your health care provider.  Arrange for a responsible adult, family member, or friend to take you home after the procedure. He or she should stay with you for at least 24 hours after the procedure, until the medicine has worn off. PROCEDURE   An intravenous (IV) catheter will be inserted into one of your  veins. Medicine will be able to flow directly into your body through this catheter. You may be given medicine through this tube to help prevent pain and help you relax.  The medical or dental procedure will be done. AFTER THE PROCEDURE  You will stay in a recovery area until the medicine has worn off. Your blood pressure and pulse will be checked.   Depending on the procedure you had, you may be allowed to go home when you can tolerate liquids and your pain is under control.   This information is not intended to replace advice given to you by your health care provider. Make  sure you discuss any questions you have with your health care provider.   Document Released: 01/10/2001 Document Revised: 05/08/2014 Document Reviewed: 12/23/2012 Elsevier Interactive Patient Education [employer].

## 2015-07-08 NOTE — Discharge Instructions (Signed)
Bone Marrow Aspiration and Bone Marrow Biopsy, Care After °Refer to this sheet in the next few weeks. These instructions provide you with information about caring for yourself after your procedure. Your health care provider may also give you more specific instructions. Your treatment has been planned according to current medical practices, but problems sometimes occur. Call your health care provider if you have any problems or questions after your procedure. °WHAT TO EXPECT AFTER THE PROCEDURE °After your procedure, it is common to have: °· Soreness or tenderness around the puncture site. °· Bruising. °HOME CARE INSTRUCTIONS °· Take medicines only as directed by your health care provider. °· Follow your health care provider's instructions about: °¨ Puncture site care. °¨ Bandage (dressing) changes and removal. °· Bathe and shower as directed by your health care provider. °· Check your puncture site every day for signs of infection. Watch for: °¨ Redness, swelling, or pain. °¨ Fluid, blood, or pus. °· Return to your normal activities as directed by your health care provider. °· Keep all follow-up visits as directed by your health care provider. This is important. °SEEK MEDICAL CARE IF: °· You have a fever. °· You have uncontrollable bleeding. °· You have redness, swelling, or pain at the site of your puncture. °· You have fluid, blood, or pus coming from your puncture site. °  °This information is not intended to replace advice given to you by your health care provider. Make sure you discuss any questions you have with your health care provider. °  °Document Released: 11/04/2004 Document Revised: 09/01/2014 Document Reviewed: 04/08/2014 °Elsevier Interactive Patient Education ©2016 Elsevier Inc. °Moderate Conscious Sedation, Adult, Care After °Refer to this sheet in the next few weeks. These instructions provide you with information on caring for yourself after your procedure. Your health care provider may also give  you more specific instructions. Your treatment has been planned according to current medical practices, but problems sometimes occur. Call your health care provider if you have any problems or questions after your procedure. °WHAT TO EXPECT AFTER THE PROCEDURE  °After your procedure: °· You may feel sleepy, clumsy, and have poor balance for several hours. °· Vomiting may occur if you eat too soon after the procedure. °HOME CARE INSTRUCTIONS °· Do not participate in any activities where you could become injured for at least 24 hours. Do not: °¨ Drive. °¨ Swim. °¨ Ride a bicycle. °¨ Operate heavy machinery. °¨ Cook. °¨ Use power tools. °¨ Climb ladders. °¨ Work from a high place. °· Do not make important decisions or sign legal documents until you are improved. °· If you vomit, drink water, juice, or soup when you can drink without vomiting. Make sure you have little or no nausea before eating solid foods. °· Only take over-the-counter or prescription medicines for pain, discomfort, or fever as directed by your health care provider. °· Make sure you and your family fully understand everything about the medicines given to you, including what side effects may occur. °· You should not drink alcohol, take sleeping pills, or take medicines that cause drowsiness for at least 24 hours. °· If you smoke, do not smoke without supervision. °· If you are feeling better, you may resume normal activities 24 hours after you were sedated. °· Keep all appointments with your health care provider. °SEEK MEDICAL CARE IF: °· Your skin is pale or bluish in color. °· You continue to feel nauseous or vomit. °· Your pain is getting worse and is not helped by medicine. °·   You have bleeding or swelling. °· You are still sleepy or feeling clumsy after 24 hours. °SEEK IMMEDIATE MEDICAL CARE IF: °· You develop a rash. °· You have difficulty breathing. °· You develop any type of allergic problem. °· You have a fever. °MAKE SURE YOU: °· Understand  these instructions. °· Will watch your condition. °· Will get help right away if you are not doing well or get worse. °  °This information is not intended to replace advice given to you by your health care provider. Make sure you discuss any questions you have with your health care provider. °  °Document Released: 02/05/2013 Document Revised: 05/08/2014 Document Reviewed: 02/05/2013 °Elsevier Interactive Patient Education ©2016 Elsevier Inc. ° ° °

## 2015-07-08 NOTE — H&P (Signed)
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  Referring Physician:Dr. Ancil Linsey  Supervising Physician: Arne Cleveland  HPI: Corey Murphy is an 65 y.o. male who has been followed by Dr. Joelyn Oms with nephrology for chronic kidney disease and was found to have an IgG monoclonal protein with a small M spike.  He was referred to oncology for a work up of this finding.  It has been recommended the patient have a bone marrow biopsy to further delineate these above findings.  He presents today for this procedure  Past Medical History:  Past Medical History  Diagnosis Date  . Type 2 diabetes mellitus ([place])   . Essential hypertension, benign   . Hypercholesteremia   . Colonic adenoma 2006  . Chronic systolic CHF (congestive heart failure) ([place])   . Nonischemic cardiomyopathy ([facility])     LVEF approximately 40% 06/2013  . Hypertensive heart disease   . Alcohol dependency ([place])   . Obesity   . History of cardiac catheterization     03/2013 LM nl, LAD nl, D1/2/3 small - nl, LCX nondom - nl, OM1/2/3 nl, RCA dom  . 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 Colonoscopy  05/20/2004    RMR: Internal hemorrhoids.  Diminutive adenomatous polyp in the mid-right colon, otherwise normal  . Ileocolonoscopy  06/28/2007    KJZ:PHXTAV rectum/Submucosal sigmoid diverticula (chronic), doubt clinical significance Hyperplastic polypoid-appearing fold, mid ascending colon, status/Remainder colonic mucosa and terminal ileum mucosa appeared normal   . Total hip arthroplasty  2010    Left  . Total hip arthroplasty  2010    Right  . Colonoscopy N/A 06/19/2012    Procedure: COLONOSCOPY;  Surgeon: Daneil Dolin, MD;  Location: [facility];  Service: Endoscopy;  Laterality: N/A;  8:30AM-rescheduled 10:30am Darius Bump notified pt  . Left and right heart  catheterization with coronary angiogram  03/19/2013    Procedure: LEFT AND RIGHT HEART CATHETERIZATION WITH CORONARY ANGIOGRAM;  Surgeon: Wellington Hampshire, MD;  Location: [facility];  Service: Cardiovascular;;    Family History:  Family History  Problem Relation Age of Onset  . Colon cancer Neg Hx   . Heart failure Mother   . Diabetes Mother   . Hypertension Mother   . Heart failure Sister   . Hypertension Sister   . Cancer Sister     Social History:  reports that he quit smoking about 7 years ago. His smoking use included Cigarettes. He has a 12 pack-year smoking history. He does not have any smokeless tobacco history on file. He reports that he uses illicit drugs (Marijuana). He reports that he does not drink alcohol.  Allergies: No Known Allergies  Medications:   Medication List    ASK your doctor about these medications        aspirin 81 MG tablet  Take 81 mg by mouth daily.     carvedilol 12.5 MG tablet  Commonly known as:  COREG  Take 1 tablet (12.5 mg total) by mouth 2 (two) times daily.     furosemide 20 MG tablet  Commonly known as:  LASIX  Take 1 tablet (20 mg total) by mouth as needed.     glimepiride 2 MG tablet  Commonly known as:  AMARYL  Take 5 mg by mouth as directed. 1 tab am, 1 1/2 tab pm     HYDROcodone-acetaminophen 10-325  MG tablet  Commonly known as:  NORCO  Take 1 tablet by mouth every 6 (six) hours as needed.     lisinopril 2.5 MG tablet  Commonly known as:  PRINIVIL,ZESTRIL  Take 2.5 mg by mouth daily.     ONE TOUCH ULTRA TEST test strip  Generic drug:  glucose blood     simvastatin 20 MG tablet  Commonly known as:  ZOCOR  Take 20 mg by mouth every evening. Reported on 06/24/2015     spironolactone 25 MG tablet  Commonly known as:  ALDACTONE  Take 12.5 mg by mouth daily.     terbinafine 250 MG tablet  Commonly known as:  LAMISIL  Take 250 mg by mouth daily. Reported on 06/24/2015        Please HPI for pertinent positives,  otherwise complete 10 system ROS negative.  Mallampati Score: MD Evaluation Airway: WNL Heart: WNL Abdomen: WNL Chest/ Lungs: WNL ASA  Classification: 3 Mallampati/Airway Score: Two  Physical Exam: BP 121/77 mmHg  Pulse 70  Temp(Src) 98.4 F (36.9 C) (Oral)  Resp 16  Ht _0  (1.727 m)  Wt 188 lb (85.276 kg)  BMI 28.59 kg/m2  SpO2 100% Body mass index is 28.59 kg/(m^2). General: pleasant, WD, WN black male who is laying in bed in NAD HEENT: head is normocephalic, atraumatic.  Sclera are noninjected.  PERRL.  Ears and nose without any masses or lesions.  Mouth is pink and moist Heart: regular, rate, and rhythm.  Normal s1,s2. No obvious murmurs, gallops, or rubs noted.  Palpable radial and pedal pulses bilaterally Lungs: CTAB, no wheezes, rhonchi, or rales noted.  Respiratory effort nonlabored Abd: soft, NT, ND, +BS, no masses, hernias, or organomegaly MS: all 4 extremities are symmetrical with no cyanosis, clubbing, or edema. Skin: warm and dry with no masses, lesions, or rashes Psych: A&Ox3 with an appropriate affect.   Labs: Results for orders placed or performed during the hospital encounter of 07/08/15 (from the past 48 hour(s))  CBC with Differential/Platelet     Status: Abnormal   Collection Time: 07/08/15  7:20 AM  Result Value Ref Range   WBC 5.7 4.0 - 10.5 K/uL   RBC 3.78 (L) 4.22 - 5.81 MIL/uL   Hemoglobin 11.5 (L) 13.0 - 17.0 g/dL   HCT 35.6 (L) 39.0 - 52.0 %   MCV 94.2 78.0 - 100.0 fL   MCH 30.4 26.0 - 34.0 pg   MCHC 32.3 30.0 - 36.0 g/dL   RDW 13.8 11.5 - 15.5 %   Platelets 307 150 - 400 K/uL   Neutrophils Relative % 45 %   Neutro Abs 2.6 1.7 - 7.7 K/uL   Lymphocytes Relative 41 %   Lymphs Abs 2.3 0.7 - 4.0 K/uL   Monocytes Relative 11 %   Monocytes Absolute 0.6 0.1 - 1.0 K/uL   Eosinophils Relative 2 %   Eosinophils Absolute 0.1 0.0 - 0.7 K/uL   Basophils Relative 1 %   Basophils Absolute 0.0 0.0 - 0.1 K/uL  Protime-INR     Status: None    Collection Time: 07/08/15  7:20 AM  Result Value Ref Range   Prothrombin Time 13.4 11.6 - 15.2 seconds   INR 1.05 0.00 - 1.49  Glucose, capillary     Status: None   Collection Time: 07/08/15  7:34 AM  Result Value Ref Range   Glucose-Capillary 94 65 - 99 mg/dL    Imaging: No results found.  Assessment/Plan 1. IgG monoclonal protein with M spike  and chronic kidney disease -plan for bone marrow biopsy today. -labs and vitals have all been reviewed and are within normal limits to proceed with the procedure. -Risks and Benefits discussed with the patient including, but not limited to bleeding, infection, damage to adjacent structures or low yield requiring additional tests. All of the patient's questions were answered, patient is agreeable to proceed. Consent signed and in chart.  Thank you for this interesting consult.  I greatly enjoyed meeting Corey Murphy and look forward to participating in their care.  A copy of this report was sent to the requesting provider on this date.  Electronically Signed: Henreitta Cea 07/08/2015, 8:40 AM   I spent a total of  30 Minutes   in face to face in clinical consultation, greater than 50% of which was counseling/coordinating care for chronic kidney disease, need for bone marrow biopsy

## 2015-07-12 ENCOUNTER — Encounter (HOSPITAL_COMMUNITY): Payer: BLUE CROSS/BLUE SHIELD | Admitting: Hematology & Oncology

## 2015-07-19 LAB — CHROMOSOME ANALYSIS, BONE MARROW

## 2015-07-19 LAB — TISSUE HYBRIDIZATION (BONE MARROW)-NCBH

## 2015-07-23 ENCOUNTER — Encounter (HOSPITAL_BASED_OUTPATIENT_CLINIC_OR_DEPARTMENT_OTHER): Payer: BLUE CROSS/BLUE SHIELD | Admitting: Hematology & Oncology

## 2015-07-23 ENCOUNTER — Encounter (HOSPITAL_COMMUNITY): Payer: Self-pay | Admitting: Hematology & Oncology

## 2015-07-23 VITALS — BP 119/72 | HR 70 | Temp 98.1°F | Resp 18 | Wt 188.5 lb

## 2015-07-23 DIAGNOSIS — D472 Monoclonal gammopathy: Secondary | ICD-10-CM | POA: Diagnosis not present

## 2015-07-23 DIAGNOSIS — N183 Chronic kidney disease, stage 3 unspecified: Secondary | ICD-10-CM

## 2015-07-23 DIAGNOSIS — N189 Chronic kidney disease, unspecified: Secondary | ICD-10-CM

## 2015-07-23 DIAGNOSIS — C9000 Multiple myeloma not having achieved remission: Secondary | ICD-10-CM

## 2015-07-23 DIAGNOSIS — D631 Anemia in chronic kidney disease: Secondary | ICD-10-CM

## 2015-07-23 NOTE — Progress Notes (Signed)
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  HISTORY OF PRESENT ILLNESS:   [name] is a 65 year old black American with a past medical history significant for type 2 DM, obesity, nonischemic cardiomyopathy, HTN, hypercholesterolemia, Stage III chronic renal disease followed by nephrologist Dr. 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  I personally reviewed and went over laboratory results with the patient.  The results are noted within this dictation. 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   [name] returns to the [facility] alone today. He is back today to discuss the results of his bone marrow biopsy.  His main concerns are with regards to continued surveillance of his kidneys. Dr. Carmina Miller follows his kidneys, and his PCP is Dr. Gerarda Fraction.  He denies any problems at the [facility] site.    PAST MEDICAL HISTORY:   Past Medical History  Diagnosis Date  . Type 2 diabetes mellitus ([place])   . Essential hypertension, benign   . Hypercholesteremia   . Colonic adenoma 2006  . Chronic systolic CHF (congestive heart failure) ([place])   . Nonischemic cardiomyopathy (H[facility])     LVEF approximately 40% 06/2013  . Hypertensive heart disease   . Alcohol dependency ([place])   . Obesity   . History of cardiac catheterization    03/2013 LM nl, LAD nl, D1/2/3 small - nl, LCX nondom - nl, OM1/2/3 nl, RCA dom  . 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    Current Outpatient Prescriptions on File Prior to Visit  Medication Sig Dispense Refill  . aspirin 81 MG tablet Take 81 mg by mouth daily.    . carvedilol (COREG) 12.5 MG tablet Take 1 tablet (12.5 mg total) by mouth 2 (two) times daily. 60 tablet 6  . furosemide (LASIX) 20 MG tablet Take 1 tablet (20 mg total) by mouth as needed. 90 tablet 3  . glimepiride (AMARYL) 2 MG tablet Take 5 mg by mouth as directed. 1 tab am, 1 1/2 tab pm    . HYDROcodone-acetaminophen (NORCO) 10-325 MG tablet Take 1 tablet by mouth every 6 (six) hours as needed.    Marland Kitchen lisinopril (PRINIVIL,ZESTRIL) 2.5 MG tablet Take 2.5 mg by mouth daily.    . ONE TOUCH ULTRA TEST test strip     . simvastatin (ZOCOR) 20 MG tablet Take 20 mg by mouth every evening. Reported on 06/24/2015    . spironolactone (ALDACTONE) 25 MG tablet Take 12.5 mg by mouth daily.    Marland Kitchen terbinafine (LAMISIL) 250 MG tablet Take 250 mg by mouth daily. Reported on 06/24/2015     No current facility-administered medications on file prior to visit.     PAST SURGICAL HISTORY Past Surgical History  Procedure Laterality Date  . Colonoscopy  05/20/2004    RMR: Internal hemorrhoids.  Diminutive adenomatous polyp in the mid-right colon, otherwise normal  . Ileocolonoscopy  06/28/2007    PXT:GGYIRS rectum/Submucosal sigmoid diverticula (chronic), doubt clinical significance Hyperplastic polypoid-appearing fold, mid ascending colon, status/Remainder colonic mucosa and terminal ileum mucosa appeared normal   . Total hip arthroplasty  2010    Left  . Total hip arthroplasty  2010    Right  . Colonoscopy N/A 06/19/2012    Procedure: COLONOSCOPY;  Surgeon: Daneil Dolin, MD;  Location: [facility];  Service: Endoscopy;  Laterality: N/A;   8:30AM-rescheduled 10:30am Darius Bump notified pt  . Left and right heart catheterization with coronary angiogram  03/19/2013    Procedure: LEFT AND RIGHT HEART CATHETERIZATION WITH CORONARY ANGIOGRAM;  Surgeon: Wellington Hampshire, MD;  Location: [facility] CATH LAB;  Service: Cardiovascular;;    FAMILY HISTORY: Family History  Problem Relation Age of Onset  . Colon cancer Neg Hx   . Heart failure Mother   . Diabetes Mother   . Hypertension Mother   . Heart failure Sister   . Hypertension Sister   . Cancer Sister   Mothers deceased at the age of 83 secondary to renal failure, refusing hemodialysis Father is alive at the age of 59. Patient reports she's had a nephrectomy. Otherwise healthy. Patient has 3 sisters, one of these is deceased in her 71s from some cancer (he is unsure of what cancer). His other 2 sisters are healthy other than one being obese. He has 2 children, 2 sons who are ages 94 and 31 respectively.  SOCIAL HISTORY:  reports that he quit smoking about 7 years ago. His smoking use included Cigarettes. He has a 12 pack-year smoking history. He does not have any smokeless tobacco history on file. He reports that he uses illicit drugs (Marijuana). He reports that he does not drink alcohol.  He reports that he used to smoke tobacco partially one half pack per day for about 17 years. This equates nearly a 9 year packing history. He quit in 2008. He admits to a history of drinking alcohol. He would drink on weekends only. He reports that he go through approximately 24 beers per weekend. He admits to rare marijuana usage. He smokes it via a bowl. He is married 17 years. 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  14 point review of systems was performed and is negative except as detailed under history of present  illness and above  PERFORMANCE STATUS: The patient's performance status is 0 - Asymptomatic   PHYSICAL EXAM: Most Recent Vital Signs: Blood pressure 119/72, pulse 70, temperature 98.1 F (36.7 C), temperature source Oral, resp. rate 18, weight 188 lb 8 oz (85.503 kg), SpO2 99 %. BP 119/72 mmHg  Pulse 70  Temp(Src) 98.1 F (36.7 C) (Oral)  Resp 18  Wt 188 lb 8 oz (85.503 kg)  SpO2 99%  General Appearance:    Alert, cooperative, no distress, appears stated age, fit man  Head:    Normocephalic, without obvious abnormality, atraumatic  Eyes:    PERRL, conjunctiva/corneas clear, EOM's intact, fundi    benign, both eyes       Ears:    Normal TM's and external ear canals, both ears  Nose:  Nares normal, septum midline, mucosa normal, no drainage    or sinus tenderness  Throat:   Lips, mucosa, and tongue normal; teeth and gums normal  Neck:   Supple, symmetrical, trachea midline, no adenopathy;       thyroid:  No enlargement/tenderness/nodules; no carotid   bruit or JVD  Back:     Symmetric, no curvature, ROM normal, no CVA tenderness  Lungs:     Clear to auscultation bilaterally, respirations unlabored  Chest wall:    No tenderness or deformity  Heart:    Regular rate and rhythm, S1 and S2 normal, no murmur, rub   or gallop  Abdomen:     Soft, non-tender, bowel sounds active all four quadrants,    no masses, no organomegaly        Extremities:   Extremities normal, atraumatic, no cyanosis or edema  Pulses:   2+ and symmetric all extremities  Skin:   Skin color, texture, turgor normal, no rashes. Small hyperpigmented lesion measuring 4 mm in size in the LLQ of abdomen that is raised without any irregular pigmentation or borders.  Lymph nodes:   Cervical, supraclavicular, and axillary nodes normal  Neurologic:   CNII-XII intact. Normal strength, sensation and reflexes      throughout    LABORATORY DATA:  I have reviewed the data as listed.  The patient comes to [place] with the  following relevant laboratory results: BUN 26, serum creatinine 1.70, GFR 49, hemoglobin 12.8, M spike 0.9 g/dL, free kappa light chain 25.82, free lambda light chain 39.40, A lambda ratio 0.66, IgG 1665, and an immunofixation shows an IgG monoclonal protein with lambda light chain specificity.      RADIOGRAPHY:   CLINICAL DATA: Abnormal serum protein electrophoresis  EXAM: METASTATIC BONE SURVEY  COMPARISON: None.  FINDINGS: A lateral view the skull shows no radiolucent lesions. No bony abnormality is seen.  Two views of the cervical spine show the cervical vertebrae to be in normal alignment. There is degenerative disc disease at C5-6 and C6-7 levels. No compression deformity is seen.  The thoracic vertebrae are in normal alignment with only mild degenerative spurring. No compression deformity is seen. No prominent paravertebral soft tissue is noted.  The lumbar vertebrae are in normal alignment with mild degenerative disc disease diffusely. No compression deformity is seen.  A view of the pelvis shows bilateral total hip replacements to be present. No complicating features are seen. The pelvic rami are intact. The SI joints are corticated.  Views of both the right and left shoulder and humerus show degenerative changes with downsloping acromion which can predispose to impingement. However no radiolucent lesions are seen.  The lungs are clear. Mediastinal and hilar contours are unremarkable. The heart is within upper limits of normal.  Views of both the right and left femur, and lower legs show no radiolucent lesions. No significant degenerative change is noted.  Views of both forearms show no radiolucent lesions and no acute abnormality.  IMPRESSION: 1. No radiolucent lesions are seen. 2. There are degenerative changes in the cervical, thoracic, and lumbar spine. 3. No active lung disease. 4. Bilateral downward sloping acromions may result in  impingement. Correlate clinically.   Electronically Signed  By: Ivar Drape M.D.  On: 06/08/2015 13:36       PATHOLOGY:  N/A  ASSESSMENT/PLAN:  Stage III CKD IgG lambda monoclonal gammopathy, 0.8 gm/dl Anemia Hgb 11.5 gm/dl Normal Myeloma Survey BMBX with 10-20% plasma cells, lambda restricted  IgG monoclonal protein with  lambda light chain specificity with a negative bone survey.  IgG of 1665, M-spike of 0.9, lambda light chain of 39.4, kappa light chain of 25.82, K/L ratio of 0.66 (norm) with Stage III chronic renal disease, normal calcium, and  anemia with Hgb of 12.8 g/dL.  We reviewed his BMBX results in detail.  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  I have recommended a PET/CT for completeness based upon NCCN guidelines. We reviewed these today.   We will have him come back sometime after his PET scan, which we will review at his next appointment. After that, we will set up his follow-up schedule. In the meantime, I will seek more educational materials on smoldering myeloma for the patient. When I see him back, I will provide these for Mr. Asher.  Orders Placed This Encounter  Procedures  . NM PET Image Initial (PI) Skull Base To Thigh    Standing Status: Future     Number of Occurrences:      Standing Expiration Date: 07/22/2016    Order Specific Question:  Reason for Exam (SYMPTOM  OR DIAGNOSIS REQUIRED)    Answer:  smoldering myeloma    Order Specific Question:  Preferred imaging location?    Answer:  [facility]    Order Specific Question:  If indicated for the ordered procedure, I authorize the administration of a radiopharmaceutical per Radiology protocol    Answer:  Yes  . Protein electrophoresis, urine    Standing Status: Future     Number of Occurrences:      Standing Expiration Date: 07/22/2016  . Immunofixation, urine    Standing Status: Future     Number of Occurrences:       Standing Expiration Date: 07/22/2016   All questions were answered. The patient knows to call the clinic with any problems, questions or concerns. We can certainly see the patient much sooner if necessary.    This document serves as a record of services personally performed by Ancil Linsey, MD. It was created on her behalf by Toni Amend, a trained medical scribe. The creation of this record is based on the scribe's personal observations and the provider's statements to them. This document has been checked and approved by the attending provider.  I have reviewed the above documentation for accuracy and completeness and I agree with the above.  This note is electronically signed by: Molli Hazard, MD  07/23/2015 8:46 AM

## 2015-07-23 NOTE — Patient Instructions (Addendum)
[place] at [facility] Discharge Instructions  RECOMMENDATIONS MADE BY THE CONSULTANT AND ANY TEST RESULTS WILL BE SENT TO YOUR REFERRING PHYSICIAN.     Exam and discussion by Dr Whitney Muse today Smoldering (asymptomiatic) myeloma (your BMBX showed 10-20% plasma cells) (less than 10% would be normal) We will monitor you a little closely every 4-6 months, too make sure you are doing well  PET [facility]  We need to get a 24 urine, you can bring it back when you are finished. Return to see the doctor after PET scan  Please call the clinic if you have any questions or concerns     24-Hour Urine Collection HOW DO I DO A 24-HOUR URINE COLLECTION?  When you get up in the morning, urinate in the toilet and flush. Write down the time. This will be your start time on the day of collection and your end time on the next morning.  From then on, collect all of your urine in the plastic jug that is given to you.  Stop collecting your urine 24 hours after you started.  If the plastic jug that is given to you already has liquid in it, that is okay. Do not throw out the liquid or rinse out the jug. Some tests need the liquid to be added to your urine.  Keep your plastic jug cool in an ice chest or keep it in the refrigerator during the test.  When 24 hours are over, bring your plastic jug to the clinic lab. Keep the jug cool in an ice chest while you are bringing it to the lab.   This information is not intended to replace advice given to you by your health care provider. Make sure you discuss any questions you have with your health care provider.   Document Released: 07/14/2008 Document Revised: 05/08/2014 Document Reviewed: 09/10/2013 Elsevier Interactive Patient Education 2016 [employer].       Thank you for choosing [place] at [facility] to provide your oncology and hematology care.  To afford each patient  quality time with our provider, please arrive at least 15 minutes before your scheduled appointment time.   Beginning January 23rd 2017 lab work for the [facility] will be done in the  Main lab at [facility] on 1st floor. If you have a lab appointment with the [facility] please come in thru the  Main Entrance and check in at the main information desk  You need to re-schedule your appointment should you arrive 10 or more minutes late.  We strive to give you quality time with our providers, and arriving late affects you and other patients whose appointments are after yours.  Also, if you no show three or more times for appointments you may be dismissed from the clinic at the providers discretion.     Again, thank you for choosing [facility].  Our hope is that these requests will decrease the amount of time that you wait before being seen by our physicians.       _____________________________________________________________  Should you have questions after your visit to [facility], please contact our office at [phone] between the hours of 8:30 a.m. and 4:30 p.m.  Voicemails left after 4:30 p.m. will not be returned until the following business day.  For prescription refill requests, have your pharmacy contact our office.         Resources  For Cancer Patients and their Caregivers ? American Cancer Society: Can assist with transportation, wigs, general needs, runs Look Good Feel Better.        (810) 027-8648 ? Cancer Care: Provides financial assistance, online support groups, medication/co-pay assistance.  1-800-813-HOPE 9802560193) ? [facility] Assists [place] cancer patients and their families through emotional , educational and financial support.  320 235 2906 ? [place] DSS Where to apply for food stamps, Medicaid and utility assistance. (405)108-5500 ? [facility]: Transportation to medical appointments.  249-201-6704 ? Social Security Administration: May apply for disability if have a Stage IV cancer. 518 459 3367 512-243-8649 ? [employer], Disability and Transit Services: Assists with nutrition, care and transit needs. [phone]

## 2015-07-25 DIAGNOSIS — D472 Monoclonal gammopathy: Secondary | ICD-10-CM | POA: Diagnosis not present

## 2015-07-26 ENCOUNTER — Encounter (HOSPITAL_COMMUNITY): Payer: BLUE CROSS/BLUE SHIELD

## 2015-07-26 DIAGNOSIS — C9000 Multiple myeloma not having achieved remission: Secondary | ICD-10-CM

## 2015-07-26 DIAGNOSIS — D472 Monoclonal gammopathy: Secondary | ICD-10-CM

## 2015-07-27 ENCOUNTER — Encounter: Payer: Self-pay | Admitting: Oncology

## 2015-07-27 LAB — UIFE/LIGHT CHAINS/TP QN, 24-HR UR
% BETA, URINE: 36.5 %
ALBUMIN, U: 12.8 %
ALPHA 1 URINE: 2.6 %
Alpha 2, Urine: 22.7 %
FREE KAPPA/LAMBDA RATIO: 3.74 (ref 2.04–10.37)
FREE LAMBDA LT CHAINS, UR: 24.8 mg/L — AB (ref 0.24–6.66)
FREE LT CHN EXCR RATE: 92.7 mg/L — AB (ref 1.35–24.19)
GAMMA GLOBULIN URINE: 25.4 %
M-SPIKE %, Urine: 4.6 % — ABNORMAL HIGH
M-Spike, Mg/24 Hr: 4 mg/24 hr — ABNORMAL HIGH
TIME-UPE24: 24 h
TOTAL PROTEIN, URINE-UR/DAY: 97.4 mg/(24.h) (ref 30.0–150.0)
Total Protein, Urine: 6.6 mg/dL
Volume, Urine: 1475 mL

## 2015-07-27 LAB — IMMUNOFIXATION, URINE

## 2015-07-29 NOTE — Progress Notes (Signed)
This encounter was created in error - please disregard.

## 2015-08-02 ENCOUNTER — Ambulatory Visit (HOSPITAL_COMMUNITY)
Admission: RE | Admit: 2015-08-02 | Discharge: 2015-08-02 | Disposition: A | Discharge status: Home / Self Care | Payer: BLUE CROSS/BLUE SHIELD | Source: Ambulatory Visit | Attending: Hematology & Oncology | Admitting: Hematology & Oncology

## 2015-08-02 ENCOUNTER — Other Ambulatory Visit (HOSPITAL_COMMUNITY): Payer: Self-pay | Admitting: Hematology & Oncology

## 2015-08-02 DIAGNOSIS — D472 Monoclonal gammopathy: Secondary | ICD-10-CM

## 2015-08-02 DIAGNOSIS — C9000 Multiple myeloma not having achieved remission: Secondary | ICD-10-CM

## 2015-08-02 LAB — GLUCOSE, CAPILLARY: Glucose-Capillary: 72 mg/dL (ref 65–99)

## 2015-08-02 MED ORDER — FLUDEOXYGLUCOSE F - 18 (FDG) INJECTION
9.3100 | Freq: Once | INTRAVENOUS | Status: AC | PRN
  Administered 2015-08-02: 9.31 via INTRAVENOUS

## 2015-08-03 ENCOUNTER — Encounter (HOSPITAL_COMMUNITY): Payer: BLUE CROSS/BLUE SHIELD | Attending: Oncology | Admitting: Hematology & Oncology

## 2015-08-03 ENCOUNTER — Encounter (HOSPITAL_COMMUNITY): Payer: Self-pay | Admitting: Hematology & Oncology

## 2015-08-03 VITALS — BP 130/73 | HR 71 | Temp 98.4°F | Resp 18 | Wt 190.7 lb

## 2015-08-03 DIAGNOSIS — D472 Monoclonal gammopathy: Secondary | ICD-10-CM | POA: Insufficient documentation

## 2015-08-03 DIAGNOSIS — N183 Chronic kidney disease, stage 3 unspecified: Secondary | ICD-10-CM

## 2015-08-03 DIAGNOSIS — C9000 Multiple myeloma not having achieved remission: Secondary | ICD-10-CM | POA: Insufficient documentation

## 2015-08-03 NOTE — Patient Instructions (Addendum)
[place] at [facility] Discharge Instructions  RECOMMENDATIONS MADE BY THE CONSULTANT AND ANY TEST RESULTS WILL BE SENT TO YOUR REFERRING PHYSICIAN.   Exam and discussion by Dr Whitney Muse today PET scan results were normal Labs in 3 months Return to see the doctor in 3 months  Please call the clinic if you have any questions or concerns    Thank you for choosing [place] at [facility] to provide your oncology and hematology care.  To afford each patient quality time with our provider, please arrive at least 15 minutes before your scheduled appointment time.   Beginning January 23rd 2017 lab work for the [facility] will be done in the  Main lab at [facility] on 1st floor. If you have a lab appointment with the [facility] please come in thru the  Main Entrance and check in at the main information desk  You need to re-schedule your appointment should you arrive 10 or more minutes late.  We strive to give you quality time with our providers, and arriving late affects you and other patients whose appointments are after yours.  Also, if you no show three or more times for appointments you may be dismissed from the clinic at the providers discretion.     Again, thank you for choosing [facility].  Our hope is that these requests will decrease the amount of time that you wait before being seen by our physicians.       _____________________________________________________________  Should you have questions after your visit to [facility], please contact our office at [phone] between the hours of 8:30 a.m. and 4:30 p.m.  Voicemails left after 4:30 p.m. will not be returned until the following business day.  For prescription refill requests, have your pharmacy contact our office.         Resources For Cancer Patients and their Caregivers ? American Cancer Society: Can assist with transportation, wigs,  general needs, runs Look Good Feel Better.        781 036 3055 ? Cancer Care: Provides financial assistance, online support groups, medication/co-pay assistance.  1-800-813-HOPE (937)762-2967) ? [facility] Assists [place] cancer patients and their families through emotional , educational and financial support.  639-171-6811 ? [place] DSS Where to apply for food stamps, Medicaid and utility assistance. 2360719946 ? [facility]: Transportation to medical appointments. (229)635-9558 ? Social Security Administration: May apply for disability if have a Stage IV cancer. 570-127-5168 575-286-2496 ? [employer], Disability and Transit Services: Assists with nutrition, care and transit needs. (641)011-6934

## 2015-08-03 NOTE — Progress Notes (Signed)
[facility] Hematology/Oncology Progress Note  Name: Corey Murphy      MRN: [id number]    Date: 08/03/2015 Time:12:56 PM   REFERRING PHYSICIAN:  Redmond School, MD  REASON FOR CONSULT:  Chronic Renal Disease   DIAGNOSIS: Smoldering Myeloma  HISTORY OF PRESENT ILLNESS:   Corey Murphy is a 65 year old black American with a past medical history significant for type 2 DM, obesity, nonischemic cardiomyopathy, HTN, hypercholesterolemia, Stage III chronic renal disease followed by nephrologist Dr. Pearson Grippe, and EtOHism who is referred to the [facility] for a IgG monoclonal protein with lambda light chain specificity with a negative bone survey and PET.  Corey Murphy returns to the [place] alone today. He is back today to discuss the results of his bone marrow biopsy.  His main concerns are with regards to continued surveillance of his kidneys. Dr. Carmina Miller follows his kidneys, and his PCP is Dr. Gerarda Fraction.  He denies any problems at the [facility] site.   He said that his appetite is good.  He has no other concerns or questions at this time.   PAST MEDICAL HISTORY:   Past Medical History  Diagnosis Date  . Type 2 diabetes mellitus ([place])   . Essential hypertension, benign   . Hypercholesteremia   . Colonic adenoma 2006  . Chronic systolic CHF (congestive heart failure) ([place])   . Nonischemic cardiomyopathy ([facility])     LVEF approximately 40% 06/2013  . Hypertensive heart disease   . Alcohol dependency ([place])   . Obesity   . History of cardiac catheterization     03/2013 LM nl, LAD nl, D1/2/3 small - nl, LCX nondom - nl, OM1/2/3 nl, RCA dom  . 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    Current Outpatient Prescriptions on File Prior to Visit  Medication Sig Dispense Refill  . aspirin 81 MG tablet Take 81 mg by mouth daily.    . carvedilol (COREG) 12.5 MG tablet  Take 1 tablet (12.5 mg total) by mouth 2 (two) times daily. 60 tablet 6  . furosemide (LASIX) 20 MG tablet Take 1 tablet (20 mg total) by mouth as needed. 90 tablet 3  . glimepiride (AMARYL) 2 MG tablet Take 5 mg by mouth as directed. 1 tab am, 1 1/2 tab pm    . HYDROcodone-acetaminophen (NORCO) 10-325 MG tablet Take 1 tablet by mouth every 6 (six) hours as needed.    Marland Kitchen lisinopril (PRINIVIL,ZESTRIL) 2.5 MG tablet Take 2.5 mg by mouth daily.    . ONE TOUCH ULTRA TEST test strip     . simvastatin (ZOCOR) 20 MG tablet Take 20 mg by mouth every evening. Reported on 06/24/2015    . spironolactone (ALDACTONE) 25 MG tablet Take 12.5 mg by mouth daily.    Marland Kitchen terbinafine (LAMISIL) 250 MG tablet Take 250 mg by mouth daily. Reported on 06/24/2015     No current facility-administered medications on file prior to visit.     PAST SURGICAL HISTORY Past Surgical History  Procedure Laterality Date  . Colonoscopy  05/20/2004    RMR: Internal hemorrhoids.  Diminutive adenomatous polyp in the mid-right colon, otherwise normal  . Ileocolonoscopy  06/28/2007    ZLD:[id number] rectum/Submucosal sigmoid diverticula (chronic), doubt clinical significance Hyperplastic polypoid-appearing fold, mid ascending colon, status/Remainder colonic mucosa and terminal ileum mucosa appeared normal   . Total hip arthroplasty  2010    Left  . Total hip arthroplasty  2010    Right  . Colonoscopy N/A 06/19/2012    Procedure: COLONOSCOPY;  Surgeon: Daneil Dolin, MD;  Location[facility];  Service: Endoscopy;  Laterality: N/A;  8:30AM-rescheduled 10:30am Darius Bump notified pt  . Left and right heart catheterization with coronary angiogram  03/19/2013    Procedure: LEFT AND RIGHT HEART CATHETERIZATION WITH CORONARY ANGIOGRAM;  Surgeon: Wellington Hampshire, MD;  Location: [place] CATH LAB;  Service: Cardiovascular;;    FAMILY HISTORY: Family History  Problem Relation Age of Onset  . Colon cancer Neg Hx   . Heart failure Mother   .  Diabetes Mother   . Hypertension Mother   . Heart failure Sister   . Hypertension Sister   . Cancer Sister   Mothers deceased at the age of 62 secondary to renal failure, refusing hemodialysis Father is alive at the age of 41. Patient reports she's had a nephrectomy. Otherwise healthy. Patient has 3 sisters, one of these is deceased in her 36s from some cancer (he is unsure of what cancer). His other 2 sisters are healthy other than one being obese. He has 2 children, 2 sons who are ages 21 and 28 respectively.  SOCIAL HISTORY:  reports that he quit smoking about 7 years ago. His smoking use included Cigarettes. He has a 12 pack-year smoking history. He does not have any smokeless tobacco history on file. He reports that he uses illicit drugs (Marijuana). He reports that he does not drink alcohol.  He reports that he used to smoke tobacco partially one half pack per day for about 17 years. This equates nearly a 9 year packing history. He quit in 2008. He admits to a history of drinking alcohol. He would drink on weekends only. He reports that he go through approximately 24 beers per weekend. He admits to rare marijuana usage. He smokes it via a bowl. He is married 29 years. 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  14 point review of systems was performed and is negative except as detailed under history of present illness and above  PERFORMANCE STATUS: The patient's performance status is 0 - Asymptomatic Vitals with BMI 08/03/2015  Height   Weight 190 lbs 11 oz  BMI   Systolic 275  Diastolic 73  Pulse 71  Respirations 18    PHYSICAL EXAM: Most Recent Vital Signs: Blood pressure 130/73, pulse 71, temperature 98.4 F (36.9 C), temperature source Oral, resp. rate 18, weight 190 lb 11.2 oz (86.501 kg), SpO2 97  %. BP 130/73 mmHg  Pulse 71  Temp(Src) 98.4 F (36.9 C) (Oral)  Resp 18  Wt 190 lb 11.2 oz (86.501 kg)  SpO2 97%  General Appearance:    Wears Glasses Alert, cooperative, no distress, appears stated age, fit man  Head:    Normocephalic, without obvious abnormality, atraumatic  Eyes:    PERRL, conjunctiva/corneas clear, EOM's intact, fundi    benign, both eyes       Ears:    Normal TM's and external ear canals, both ears  Nose:   Nares normal, septum midline, mucosa normal, no drainage    or sinus tenderness  Throat:   Lips, mucosa, and tongue normal; teeth and gums normal  Neck:  Supple, symmetrical, trachea midline, no adenopathy;       thyroid:  No enlargement/tenderness/nodules; no carotid   bruit or JVD  Back:     Symmetric, no curvature, ROM normal, no CVA tenderness  Lungs:     Clear to auscultation bilaterally, respirations unlabored  Chest wall:    No tenderness or deformity  Heart:    Regular rate and rhythm, S1 and S2 normal, no murmur, rub   or gallop  Abdomen:     Soft, non-tender, bowel sounds active all four quadrants,    no masses, no organomegaly        Extremities:   Extremities normal, atraumatic, no cyanosis or edema  Pulses:   2+ and symmetric all extremities  Skin:   Skin color, texture, turgor normal, no rashes. Small hyperpigmented lesion measuring 4 mm in size in the LLQ of abdomen that is raised without any irregular pigmentation or borders.  Lymph nodes:   Cervical, supraclavicular, and axillary nodes normal  Neurologic:   CNII-XII intact. Normal strength, sensation and reflexes      throughout    LABORATORY DATA:  I have reviewed the data as listed.  The patient comes to [place] with the following relevant laboratory results: BUN 26, serum creatinine 1.70, GFR 49, hemoglobin 12.8, M spike 0.9 g/dL, free kappa light chain 25.82, free lambda light chain 39.40, A lambda ratio 0.66, IgG 1665, and an immunofixation shows an IgG monoclonal protein with lambda  light chain specificity.   Results for HEITH, HAIGLER (MRN [id number]) as of 08/03/2015 08:00  Ref. Range 07/25/2015 08:00 08/02/2015 06:54  Glucose-Capillary Latest Ref Range: 65-99 mg/dL  72  % Beta Latest Units: % 36.5   M-SPIKE, % Latest Ref Range: Not Observed % 4.6 (H)   NOTE: Unknown Comment   Immunofixation, Urine Unknown Comment   Time-UPE24 Latest Units: hours 24   Volume, Urine-UPE24 Latest Units: mL 1475   Total Protein, Urine-UPE24 Latest Ref Range: Not Estab. mg/dL 6.6   Total Protein, Urine-Ur/day Latest Ref Range: 30.0-150.0 mg/24 hr 97.4   ALBUMIN, U Latest Units: % 12.8   ALPHA 1 URINE Latest Units: % 2.6   Alpha 2, Urine Latest Units: % 22.7   GAMMA GLOBULIN URINE Latest Units: % 25.4   Free Lt Chn Excr Rate Latest Ref Range: 1.35-24.19 mg/L 92.70 (H)   Free Lambda Lt Chains,Ur Latest Ref Range: 0.24-6.66 mg/L 24.80 (H)   Free Kappa/Lambda Ratio Latest Ref Range: 2.04-10.37  3.74   Immunofixation Result, Urine Unknown Comment   M-Spike, mg/24 hr Latest Ref Range: Not Observed mg/24 hr 4 (H)    RADIOGRAPHY: I have personally reviewed the radiological images as listed and agreed with the findings in the report.  Study Result     CLINICAL DATA: Initial treatment strategy for multiple myeloma  EXAM: NUCLEAR MEDICINE PET WHOLE BODY  TECHNIQUE: 9.31 mCi F-18 FDG was injected intravenously. Full-ring PET imaging was performed from the vertex to the feet after the radiotracer. CT data was obtained and used for attenuation correction and anatomic localization.  FASTING BLOOD GLUCOSE: Value: 72 mg/dl  COMPARISON: None.  FINDINGS: Head/Neck: No hypermetabolic lymph nodes in the neck.  Chest: No hypermetabolic mediastinal or hilar nodes. Aortic atherosclerosis noted. No suspicious pulmonary nodules on the CT scan.  Abdomen/Pelvis: No abnormal hypermetabolic activity within the liver, pancreas, adrenal glands, or spleen. Aortic atherosclerosis. No  hypermetabolic lymph nodes in the abdomen or pelvis.  Skeleton: No focal hypermetabolic activity to suggest skeletal metastasis.  Extremities: No hypermetabolic activity to suggest metastasis.  IMPRESSION: 1. No evidence for hypermetabolic tumor.   Electronically Signed  By: Kerby Moors M.D.  On: 08/02/2015 13:07     CLINICAL DATA: Abnormal serum protein electrophoresis  EXAM: METASTATIC BONE SURVEY  COMPARISON: None.  FINDINGS: A lateral view the skull shows no radiolucent lesions. No bony abnormality is seen.  Two views of the cervical spine show the cervical vertebrae to be in normal alignment. There is degenerative disc disease at C5-6 and C6-7 levels. No compression deformity is seen.  The thoracic vertebrae are in normal alignment with only mild degenerative spurring. No compression deformity is seen. No prominent paravertebral soft tissue is noted.  The lumbar vertebrae are in normal alignment with mild degenerative disc disease diffusely. No compression deformity is seen.  A view of the pelvis shows bilateral total hip replacements to be present. No complicating features are seen. The pelvic rami are intact. The SI joints are corticated.  Views of both the right and left shoulder and humerus show degenerative changes with downsloping acromion which can predispose to impingement. However no radiolucent lesions are seen.  The lungs are clear. Mediastinal and hilar contours are unremarkable. The heart is within upper limits of normal.  Views of both the right and left femur, and lower legs show no radiolucent lesions. No significant degenerative change is noted.  Views of both forearms show no radiolucent lesions and no acute abnormality.  IMPRESSION: 1. No radiolucent lesions are seen. 2. There are degenerative changes in the cervical, thoracic, and lumbar spine. 3. No active lung disease. 4. Bilateral downward sloping  acromions may result in impingement. Correlate clinically.   Electronically Signed  By: Ivar Drape M.D.  On: 06/08/2015 13:36       PATHOLOGY:    ASSESSMENT/PLAN:  Stage III CKD IgG lambda monoclonal gammopathy, 0.8 gm/dl Anemia Hgb 11.5 gm/dl Normal Myeloma Survey Negative PET BMBX with 10-20% plasma cells, lambda restricted Smoldering Myeloma  IgG monoclonal protein with lambda light chain specificity with a negative bone survey.  IgG of 1665, M-spike of 0.9, lambda light chain of 39.4, kappa light chain of 25.82, K/L ratio of 0.66 (norm) with Stage III chronic renal disease, normal calcium, and  anemia with Hgb of 12.8 g/dL.  We reviewed his BMBX results in detail. We reviewed his PET.  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   The diagnostic approach to a patient with kidney disease and a monoclonal plasma or B cell disorder or monoclonal protein depends upon the clinical presentation of the patient. The overall goal of the evaluation is to determine whether a monoclonal protein is involved in the pathogenesis of the kidney disease. In most cases, a kidney biopsy is required to establish this association and to guide therapy. I discussed this with him today. Kidney function will be monitored closely, if any significant changes occur we can discuss biopsy with his nephrologist.    Orders Placed This Encounter  Procedures  . CBC with Differential    Standing Status: Future     Number of Occurrences:      Standing Expiration Date: 08/02/2016  . Comprehensive metabolic panel    Standing Status: Future     Number of Occurrences:      Standing Expiration Date: 08/02/2016  . Kappa/lambda light chains    Standing Status: Future     Number of Occurrences:      Standing Expiration  Date: 08/02/2016  . Immunofixation electrophoresis    Standing Status: Future     Number of Occurrences:      Standing Expiration Date:  08/02/2016  . Protein electrophoresis, serum    Standing Status: Future     Number of Occurrences:      Standing Expiration Date: 08/02/2016    He will return in 3 months for a follow up and labs.  All questions were answered. The patient knows to call the clinic with any problems, questions or concerns. We can certainly see the patient much sooner if necessary.    This document serves as a record of services personally performed by Ancil Linsey, MD. It was created on her behalf by Kandace Blitz, a trained medical scribe. The creation of this record is based on the scribe's personal observations and the provider's statements to them. This document has been checked and approved by the attending provider.  I have reviewed the above documentation for accuracy and completeness and I agree with the above.  This note is electronically signed by:  Molli Hazard, MD  08/03/2015 12:56 PM

## 2015-08-10 ENCOUNTER — Encounter (HOSPITAL_COMMUNITY): Payer: Self-pay

## 2015-08-11 ENCOUNTER — Encounter: Payer: Self-pay | Admitting: Cardiology

## 2015-08-11 ENCOUNTER — Ambulatory Visit (INDEPENDENT_AMBULATORY_CARE_PROVIDER_SITE_OTHER): Payer: BLUE CROSS/BLUE SHIELD | Admitting: Cardiology

## 2015-08-11 VITALS — BP 116/64 | HR 69 | Wt 189.0 lb

## 2015-08-11 DIAGNOSIS — I10 Essential (primary) hypertension: Secondary | ICD-10-CM

## 2015-08-11 DIAGNOSIS — I429 Cardiomyopathy, unspecified: Secondary | ICD-10-CM

## 2015-08-11 DIAGNOSIS — I428 Other cardiomyopathies: Secondary | ICD-10-CM

## 2015-08-11 NOTE — Progress Notes (Signed)
Cardiology Office Note  Date: 08/11/2015   ID: Corey Murphy, DOB 22-May-1950, MRN [id number]  PCP: Corey Murphy., MD  Primary Cardiologist: Corey Lesches, MD   Chief Complaint  Patient presents with  . Coronary Artery Disease  . History of cardiomyopathy    History of Present Illness: Corey Murphy is a 65 y.o. male last seen in December 2016. He presents for a routine follow-up visit. Continues to do very well, exercises 5 days a week. He reports no angina symptoms and NYHA class I dyspnea.  His most recent echocardiogram done in December 2016 showed normalization of LVEF. We discussed the results today.  I reviewed his cardiac medications which are outlined below and unchanged. He reports no intolerances.  Past Medical History  Diagnosis Date  . Type 2 diabetes mellitus ([place])   . Essential hypertension, benign   . Hypercholesteremia   . Colonic adenoma 2006  . Nonischemic cardiomyopathy ([facility])     LVEF approximately 40% 06/2013 - improved to normal range  . Hypertensive heart disease   . Alcohol dependency ([place])   . Obesity   . History of cardiac catheterization     03/2013 LM nl, LAD nl, D1/2/3 small - nl, LCX nondom - nl, OM1/2/3 nl, RCA dom  . IgG lambda monoclonal gammopathy 06/24/2015    Current Outpatient Prescriptions  Medication Sig Dispense Refill  . aspirin 81 MG tablet Take 81 mg by mouth daily.    . carvedilol (COREG) 12.5 MG tablet Take 1 tablet (12.5 mg total) by mouth 2 (two) times daily. 60 tablet 6  . furosemide (LASIX) 20 MG tablet Take 1 tablet (20 mg total) by mouth as needed. 90 tablet 3  . glimepiride (AMARYL) 2 MG tablet Take 2 mg by mouth daily with breakfast. 2 tabs BID    . HYDROcodone-acetaminophen (NORCO) 10-325 MG tablet Take 1 tablet by mouth every 6 (six) hours as needed.    Marland Kitchen lisinopril (PRINIVIL,ZESTRIL) 2.5 MG tablet Take 2.5 mg by mouth daily.    . ONE TOUCH ULTRA TEST test strip     . spironolactone (ALDACTONE) 25 MG tablet  Take 12.5 mg by mouth daily.     No current facility-administered medications for this visit.   Allergies:  Review of patient's allergies indicates no known allergies.   Social History: The patient  reports that he quit smoking about 7 years ago. His smoking use included Cigarettes. He has a 12 pack-year smoking history. He does not have any smokeless tobacco history on file. He reports that he uses illicit drugs (Marijuana). He reports that he does not drink alcohol.   ROS:  Please see the history of present illness. Otherwise, complete review of systems is positive for none.  All other systems are reviewed and negative.   Physical Exam: VS:  BP 116/64 mmHg  Pulse 69  Wt 189 lb (85.73 kg)  SpO2 99%, BMI Body mass index is 28.74 kg/(m^2).  Wt Readings from Last 3 Encounters:  08/11/15 189 lb (85.73 kg)  08/03/15 190 lb 11.2 oz (86.501 kg)  07/23/15 188 lb 8 oz (85.503 kg)    Patient comfortable at rest.  HEENT: Conjunctiva and lids normal, oropharynx clear.  Neck: Supple, no elevated JVP or carotid bruits, no thyromegaly.  Lungs: Clear to auscultation, nonlabored breathing at rest.  Cardiac: Regular rate and rhythm, no S3, no pericardial rub.  Abdomen: Soft, nontender, bowel sounds present, no guarding or rebound.  Extremities: No pitting edema, distal pulses 2+.  ECG: I personally reviewed the prior tracing from 12/17/2014 which showed sinus rhythm with prolonged PR interval and PVC.  Recent Labwork: 07/05/2015: ALT 29; AST 17; BUN 27*; Creatinine, Ser 1.58*; Potassium 4.3; Sodium 137 07/08/2015: Hemoglobin 11.5*; Platelets 307     Component Value Date/Time   CHOL 144 03/19/2013 0428   TRIG 110 03/19/2013 0428   HDL 49 03/19/2013 0428   CHOLHDL 2.9 03/19/2013 0428   VLDL 22 03/19/2013 0428   LDLCALC 73 03/19/2013 0428    Other Studies Reviewed Today:  Echocardiogram 04/27/2015: Study Conclusions  - Left ventricle: The cavity size was normal. Wall thickness was   increased in a pattern of mild LVH. Systolic function was normal.  The estimated ejection fraction was in the range of 55% to 60%.  Doppler parameters are consistent with abnormal left ventricular  relaxation (grade 1 diastolic dysfunction). - Mitral valve: There was mild regurgitation.  Assessment and Plan:  1. History of nonischemic cardiomyopathy with normalization of LVEF on medical therapy. Encouraged regular exercise plan, no changes made present medical regimen.  2. Essential hypertension, blood pressure is very well controlled today.  Current medicines were reviewed with the patient today.  Disposition: FU with me in 6 months.   Signed, Corey Sark, MD, [facility] 08/11/2015 10:38 AM    [place] [facility] [place]. [address] [place], [place] [address] Phone: [phone]; Fax: [phone]

## 2015-08-11 NOTE — Patient Instructions (Signed)
Your physician wants you to follow-up in: 6 months You will receive a reminder letter in the mail two months in advance. If you don't receive a letter, please call our office to schedule the follow-up appointment.    Your physician recommends that you continue on your current medications as directed. Please refer to the Current Medication list given to you today.    If you need a refill on your cardiac medications before your next appointment, please call your pharmacy.     Thank you for choosing  [facility] !        

## 2015-08-16 ENCOUNTER — Other Ambulatory Visit: Payer: Self-pay | Admitting: Cardiology

## 2015-09-04 ENCOUNTER — Encounter (HOSPITAL_COMMUNITY): Payer: Self-pay | Admitting: Hematology & Oncology

## 2015-09-24 ENCOUNTER — Other Ambulatory Visit: Payer: Self-pay

## 2015-09-24 MED ORDER — FUROSEMIDE 20 MG PO TABS: 20.0000 mg | ORAL_TABLET | ORAL | Status: DC | PRN

## 2015-09-24 NOTE — Telephone Encounter (Signed)
Refill complete 

## 2015-10-13 DIAGNOSIS — C9000 Multiple myeloma not having achieved remission: Secondary | ICD-10-CM | POA: Diagnosis not present

## 2015-10-13 DIAGNOSIS — E1129 Type 2 diabetes mellitus with other diabetic kidney complication: Secondary | ICD-10-CM | POA: Diagnosis not present

## 2015-10-13 DIAGNOSIS — I5032 Chronic diastolic (congestive) heart failure: Secondary | ICD-10-CM | POA: Diagnosis not present

## 2015-10-13 DIAGNOSIS — L84 Corns and callosities: Secondary | ICD-10-CM | POA: Diagnosis not present

## 2015-10-13 DIAGNOSIS — E6609 Other obesity due to excess calories: Secondary | ICD-10-CM | POA: Diagnosis not present

## 2015-10-13 DIAGNOSIS — Z6830 Body mass index (BMI) 30.0-30.9, adult: Secondary | ICD-10-CM | POA: Diagnosis not present

## 2015-10-13 DIAGNOSIS — R69 Illness, unspecified: Secondary | ICD-10-CM | POA: Diagnosis not present

## 2015-10-13 DIAGNOSIS — E782 Mixed hyperlipidemia: Secondary | ICD-10-CM | POA: Diagnosis not present

## 2015-10-13 DIAGNOSIS — E119 Type 2 diabetes mellitus without complications: Secondary | ICD-10-CM | POA: Diagnosis not present

## 2015-10-13 DIAGNOSIS — Z1389 Encounter for screening for other disorder: Secondary | ICD-10-CM | POA: Diagnosis not present

## 2015-10-13 DIAGNOSIS — E1140 Type 2 diabetes mellitus with diabetic neuropathy, unspecified: Secondary | ICD-10-CM | POA: Diagnosis not present

## 2015-10-13 DIAGNOSIS — E785 Hyperlipidemia, unspecified: Secondary | ICD-10-CM | POA: Diagnosis not present

## 2015-10-13 DIAGNOSIS — R201 Hypoesthesia of skin: Secondary | ICD-10-CM | POA: Diagnosis not present

## 2015-10-22 DIAGNOSIS — E1129 Type 2 diabetes mellitus with other diabetic kidney complication: Secondary | ICD-10-CM | POA: Diagnosis not present

## 2015-10-22 DIAGNOSIS — E782 Mixed hyperlipidemia: Secondary | ICD-10-CM | POA: Diagnosis not present

## 2015-11-04 ENCOUNTER — Encounter (HOSPITAL_COMMUNITY): Payer: Medicare HMO

## 2015-11-04 ENCOUNTER — Encounter (HOSPITAL_COMMUNITY): Payer: Self-pay | Admitting: Hematology & Oncology

## 2015-11-04 ENCOUNTER — Encounter (HOSPITAL_COMMUNITY): Payer: Medicare HMO | Attending: Oncology | Admitting: Hematology & Oncology

## 2015-11-04 VITALS — BP 144/86 | HR 63 | Temp 98.2°F | Resp 18 | Wt 201.0 lb

## 2015-11-04 DIAGNOSIS — N183 Chronic kidney disease, stage 3 unspecified: Secondary | ICD-10-CM

## 2015-11-04 DIAGNOSIS — D472 Monoclonal gammopathy: Secondary | ICD-10-CM | POA: Diagnosis not present

## 2015-11-04 DIAGNOSIS — N189 Chronic kidney disease, unspecified: Secondary | ICD-10-CM

## 2015-11-04 DIAGNOSIS — C9000 Multiple myeloma not having achieved remission: Secondary | ICD-10-CM | POA: Diagnosis not present

## 2015-11-04 DIAGNOSIS — D631 Anemia in chronic kidney disease: Secondary | ICD-10-CM | POA: Diagnosis not present

## 2015-11-04 LAB — CBC WITH DIFFERENTIAL/PLATELET
BASOS ABS: 0.1 10*3/uL (ref 0.0–0.1)
BASOS PCT: 1 %
Eosinophils Absolute: 0.2 10*3/uL (ref 0.0–0.7)
Eosinophils Relative: 3 %
HEMATOCRIT: 36.4 % — AB (ref 39.0–52.0)
HEMOGLOBIN: 12.3 g/dL — AB (ref 13.0–17.0)
LYMPHS PCT: 40 %
Lymphs Abs: 2.3 10*3/uL (ref 0.7–4.0)
MCH: 31.2 pg (ref 26.0–34.0)
MCHC: 33.8 g/dL (ref 30.0–36.0)
MCV: 92.4 fL (ref 78.0–100.0)
MONO ABS: 0.7 10*3/uL (ref 0.1–1.0)
Monocytes Relative: 12 %
NEUTROS PCT: 44 %
Neutro Abs: 2.6 10*3/uL (ref 1.7–7.7)
Platelets: 250 10*3/uL (ref 150–400)
RBC: 3.94 MIL/uL — AB (ref 4.22–5.81)
RDW: 13.5 % (ref 11.5–15.5)
WBC: 5.9 10*3/uL (ref 4.0–10.5)

## 2015-11-04 LAB — COMPREHENSIVE METABOLIC PANEL
ALBUMIN: 3.8 g/dL (ref 3.5–5.0)
ALK PHOS: 81 U/L (ref 38–126)
ALT: 33 U/L (ref 17–63)
AST: 19 U/L (ref 15–41)
Anion gap: 4 — ABNORMAL LOW (ref 5–15)
BILIRUBIN TOTAL: 0.5 mg/dL (ref 0.3–1.2)
BUN: 23 mg/dL — AB (ref 6–20)
CO2: 28 mmol/L (ref 22–32)
CREATININE: 1.41 mg/dL — AB (ref 0.61–1.24)
Calcium: 9 mg/dL (ref 8.9–10.3)
Chloride: 105 mmol/L (ref 101–111)
GFR calc Af Amer: 60 mL/min — ABNORMAL LOW (ref >60)
GFR, EST NON AFRICAN AMERICAN: 52 mL/min — AB (ref >60)
Glucose, Bld: 135 mg/dL — ABNORMAL HIGH (ref 65–99)
Potassium: 5 mmol/L (ref 3.5–5.1)
Sodium: 137 mmol/L (ref 135–145)
TOTAL PROTEIN: 7.4 g/dL (ref 6.5–8.1)

## 2015-11-04 NOTE — Progress Notes (Signed)
[facility] Hematology/Oncology Progress Note  Name: Corey Murphy      MRN: [id number]    Date: 11/04/2015 Time:12:30 PM   REFERRING PHYSICIAN:  Redmond School, MD  REASON FOR CONSULT:  Chronic Renal Disease   DIAGNOSIS: Smoldering Myeloma  HISTORY OF PRESENT ILLNESS:   Corey Murphy is a 65 year old black American with a past medical history significant for type 2 DM, obesity, nonischemic cardiomyopathy, HTN, hypercholesterolemia, Stage III chronic renal disease followed by nephrologist Dr. Pearson Grippe, and EtOHism who is referred to the [facility] for a IgG monoclonal protein with lambda light chain specificity with a negative bone survey and PET.  Corey Murphy returns to the [facility] today accompanied by his wife. He just got back from vacation in [place], [place].   He notes that he's been feeling good and that his appetite is "too good." He says that he's gained about ten pounds on vacation. He notes that his cardiologist picks on him about his weight, but that he works out five days a week and tries to stay "pretty active." He sees Dr. Domenic Polite.   During the physical exam, Mr. Hubbert denies any chest pain, and confirms that his breathing is good. Overall he remains at his prior baseline.    PAST MEDICAL HISTORY:   Past Medical History  Diagnosis Date  . Type 2 diabetes mellitus ([place])   . Essential hypertension, benign   . Hypercholesteremia   . Colonic adenoma 2006  . Nonischemic cardiomyopathy (HCC)     LVEF approximately 40% 06/2013 - improved to normal range  . Hypertensive heart disease   . Alcohol dependency ([place])   . Obesity   . History of cardiac catheterization     03/2013 LM nl, LAD nl, D1/2/3 small - nl, LCX nondom - nl, OM1/2/3 nl, RCA dom  . 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    Current Outpatient Prescriptions on File Prior  to Visit  Medication Sig Dispense Refill  . aspirin 81 MG tablet Take 81 mg by mouth daily.    . carvedilol (COREG) 12.5 MG tablet TAKE (1) TABLET BY MOUTH TWICE A DAY WITH FOOD. 60 tablet 6  . furosemide (LASIX) 20 MG tablet Take 1 tablet (20 mg total) by mouth as needed. 90 tablet 3  . glimepiride (AMARYL) 2 MG tablet Take 2 mg by mouth daily with breakfast. 2 tabs BID    . HYDROcodone-acetaminophen (NORCO) 10-325 MG tablet Take 1 tablet by mouth every 6 (six) hours as needed.    Marland Kitchen lisinopril (PRINIVIL,ZESTRIL) 2.5 MG tablet Take 2.5 mg by mouth daily.    . ONE TOUCH ULTRA TEST test strip     . spironolactone (ALDACTONE) 25 MG tablet Take 12.5 mg by mouth daily.     No current facility-administered medications on file prior to visit.     PAST SURGICAL HISTORY Past Surgical History  Procedure Laterality Date  . Colonoscopy  05/20/2004    RMR: Internal hemorrhoids.  Diminutive adenomatous polyp in the mid-right colon, otherwise normal  . Ileocolonoscopy  06/28/2007    TDV:VOHYWV rectum/Submucosal sigmoid diverticula (chronic), doubt clinical significance Hyperplastic polypoid-appearing fold, mid ascending colon, status/Remainder colonic mucosa and terminal ileum mucosa appeared normal   . Total hip arthroplasty  2010    Left  . Total hip arthroplasty  2010    Right  .  Colonoscopy N/A 06/19/2012    Procedure: COLONOSCOPY;  Surgeon: Daneil Dolin, MD;  [facility];  Service: Endoscopy;  Laterality: N/A;  8:30AM-rescheduled 10:30am Darius Bump notified pt  . Left and right heart catheterization with coronary angiogram  03/19/2013    Procedure: LEFT AND RIGHT HEART CATHETERIZATION WITH CORONARY ANGIOGRAM;  Surgeon: Wellington Hampshire, MD;  Location: [facility] CATH LAB;  Service: Cardiovascular;;    FAMILY HISTORY: Family History  Problem Relation Age of Onset  . Colon cancer Neg Hx   . Heart failure Mother   . Diabetes Mother   . Hypertension Mother   . Heart failure Sister   .  Hypertension Sister   . Cancer Sister   Mothers deceased at the age of 15 secondary to renal failure, refusing hemodialysis Father is alive at the age of 2. Patient reports she's had a nephrectomy. Otherwise healthy. Patient has 3 sisters, one of these is deceased in her 38s from some cancer (he is unsure of what cancer). His other 2 sisters are healthy other than one being obese. He has 2 children, 2 sons who are ages 51 and 85 respectively.  SOCIAL HISTORY:  reports that he quit smoking about 7 years ago. His smoking use included Cigarettes. He has a 12 pack-year smoking history. He does not have any smokeless tobacco history on file. He reports that he uses illicit drugs (Marijuana). He reports that he does not drink alcohol.  He reports that he used to smoke tobacco partially one half pack per day for about 17 years. This equates nearly a 9 year packing history. He quit in 2008. He admits to a history of drinking alcohol. He would drink on weekends only. He reports that he go through approximately 24 beers per weekend. He admits to rare marijuana usage. He smokes it via a bowl. He is married 58 years. 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  14 point review of systems was performed and is negative except as detailed under history of present illness and above    PERFORMANCE STATUS: The patient's performance status is 0 - Asymptomatic  PHYSICAL EXAM: Most Recent Vital Signs: Blood pressure 144/86, pulse 63, temperature 98.2 F (36.8 C), temperature source Oral, resp. rate 18, weight 201 lb (91.173 kg), SpO2 100 %. BP 144/86 mmHg  Pulse 63  Temp(Src) 98.2 F (36.8 C) (Oral)  Resp 18  Wt 201 lb (91.173 kg)  SpO2 100%  General Appearance:    Wears Glasses Alert, cooperative, no distress, appears stated age,  fit man  Head:    Normocephalic, without obvious abnormality, atraumatic  Eyes:    PERRL, conjunctiva/corneas clear, EOM's intact, fundi    benign, both eyes       Ears:    Normal TM's and external ear canals, both ears  Nose:   Nares normal, septum midline, mucosa normal, no drainage    or sinus tenderness  Throat:   Lips, mucosa, and tongue normal; teeth and gums normal  Neck:   Supple, symmetrical, trachea midline, no adenopathy;       thyroid:  No enlargement/tenderness/nodules; no carotid   bruit or JVD  Back:     Symmetric, no curvature, ROM normal, no CVA tenderness  Lungs:     Clear to auscultation bilaterally, respirations  unlabored  Chest wall:    No tenderness or deformity  Heart:    Regular rate and rhythm, S1 and S2 normal, no murmur, rub   or gallop  Abdomen:     Soft, non-tender, bowel sounds active all four quadrants,    no masses, no organomegaly        Extremities:   Extremities normal, atraumatic, no cyanosis or edema  Pulses:   2+ and symmetric all extremities  Skin:   Skin color, texture, turgor normal, no rashes. Small hyperpigmented lesion measuring 4 mm in size in the LLQ of abdomen that is raised without any irregular pigmentation or borders.  Lymph nodes:   Cervical, supraclavicular, and axillary nodes normal  Neurologic:   CNII-XII intact. Normal strength, sensation and reflexes      throughout    LABORATORY DATA:  I have reviewed the data as listed.  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   Results for COLTRANE, TUGWELL (MRN [id number]) as of 11/04/2015 12:24  Ref. Range 11/04/2015 10:35  Sodium Latest Ref Range: 135-145 mmol/L 137  Potassium Latest Ref Range: 3.5-5.1 mmol/L 5.0   Chloride Latest Ref Range: 101-111 mmol/L 105  CO2 Latest Ref Range: 22-32 mmol/L 28  BUN Latest Ref Range: 6-20 mg/dL 23 (H)  Creatinine Latest Ref Range: 0.61-1.24 mg/dL 1.41 (H)  Calcium Latest Ref Range: 8.9-10.3 mg/dL 9.0  EGFR (Non-African Amer.) Latest Ref Range: >60 mL/min 52 (L)  EGFR (African American) Latest Ref Range: >60 mL/min 60 (L)  Glucose Latest Ref Range: 65-99 mg/dL 135 (H)  Anion gap Latest Ref Range: 5-15  4 (L)  Alkaline Phosphatase Latest Ref Range: 38-126 U/L 81  Albumin Latest Ref Range: 3.5-5.0 g/dL 3.8  AST Latest Ref Range: 15-41 U/L 19  ALT Latest Ref Range: 17-63 U/L 33  Total Protein Latest Ref Range: 6.5-8.1 g/dL 7.4  Total Bilirubin Latest Ref Range: 0.3-1.2 mg/dL 0.5  WBC Latest Ref Range: 4.0-10.5 K/uL 5.9  RBC Latest Ref Range: 4.22-5.81 MIL/uL 3.94 (L)  Hemoglobin Latest Ref Range: 13.0-17.0 g/dL 12.3 (L)  HCT Latest Ref Range: 39.0-52.0 % 36.4 (L)  MCV Latest Ref Range: 78.0-100.0 fL 92.4  MCH Latest Ref Range: 26.0-34.0 pg 31.2  MCHC Latest Ref Range: 30.0-36.0 g/dL 33.8  RDW Latest Ref Range: 11.5-15.5 % 13.5  Platelets Latest Ref Range: 150-400 K/uL 250  Neutrophils Latest Units: % 44  Lymphocytes Latest Units: % 40  Monocytes Relative Latest Units: % 12  Eosinophil Latest Units: % 3  Basophil Latest Units: % 1  NEUT# Latest Ref Range: 1.7-7.7 K/uL 2.6  Lymphocyte # Latest Ref Range: 0.7-4.0 K/uL 2.3  Monocyte # Latest Ref Range: 0.1-1.0 K/uL 0.7  Eosinophils Absolute Latest Ref Range: 0.0-0.7 K/uL 0.2  Basophils Absolute Latest Ref Range: 0.0-0.1 K/uL 0.1   RADIOGRAPHY: I have personally reviewed the radiological images as listed and agreed with the findings in the report.  Study Result     CLINICAL DATA: Initial treatment strategy for multiple myeloma  EXAM: NUCLEAR MEDICINE PET WHOLE BODY  TECHNIQUE: 9.31 mCi F-18 FDG was injected intravenously. Full-ring PET imaging was performed from the vertex to the feet after the  radiotracer. CT data was obtained and used for attenuation correction and  anatomic localization.  FASTING BLOOD GLUCOSE: Value: 72 mg/dl  COMPARISON: None.  FINDINGS: Head/Neck: No hypermetabolic lymph nodes in the neck.  Chest: No hypermetabolic mediastinal or hilar nodes. Aortic atherosclerosis noted. No suspicious pulmonary nodules on the CT scan.  Abdomen/Pelvis: No abnormal hypermetabolic activity within the liver, pancreas, adrenal glands, or spleen. Aortic atherosclerosis. No hypermetabolic lymph nodes in the abdomen or pelvis.  Skeleton: No focal hypermetabolic activity to suggest skeletal metastasis.  Extremities: No hypermetabolic activity to suggest metastasis.  IMPRESSION: 1. No evidence for hypermetabolic tumor.   Electronically Signed  By: Kerby Moors M.D.  On: 08/02/2015 13:07     CLINICAL DATA: Abnormal serum protein electrophoresis  EXAM: METASTATIC BONE SURVEY  COMPARISON: None.  FINDINGS: A lateral view the skull shows no radiolucent lesions. No bony abnormality is seen.  Two views of the cervical spine show the cervical vertebrae to be in normal alignment. There is degenerative disc disease at C5-6 and C6-7 levels. No compression deformity is seen.  The thoracic vertebrae are in normal alignment with only mild degenerative spurring. No compression deformity is seen. No prominent paravertebral soft tissue is noted.  The lumbar vertebrae are in normal alignment with mild degenerative disc disease diffusely. No compression deformity is seen.  A view of the pelvis shows bilateral total hip replacements to be present. No complicating features are seen. The pelvic rami are intact. The SI joints are corticated.  Views of both the right and left shoulder and humerus show degenerative changes with downsloping acromion which can predispose to impingement. However no radiolucent lesions are seen.  The lungs are  clear. Mediastinal and hilar contours are unremarkable. The heart is within upper limits of normal.  Views of both the right and left femur, and lower legs show no radiolucent lesions. No significant degenerative change is noted.  Views of both forearms show no radiolucent lesions and no acute abnormality.  IMPRESSION: 1. No radiolucent lesions are seen. 2. There are degenerative changes in the cervical, thoracic, and lumbar spine. 3. No active lung disease. 4. Bilateral downward sloping acromions may result in impingement. Correlate clinically.   Electronically Signed  By: Ivar Drape M.D.  On: 06/08/2015 13:36       PATHOLOGY:    ASSESSMENT/PLAN:  Stage III CKD IgG lambda monoclonal gammopathy, 0.8 gm/dl Anemia Hgb 11.5 gm/dl Normal Myeloma Survey Negative PET BMBX with 10-20% plasma cells, lambda restricted Smoldering Myeloma  IgG monoclonal protein with lambda light chain specificity with a negative bone survey.  IgG of 1665, M-spike of 0.9, lambda light chain of 39.4, kappa light chain of 25.82, K/L ratio of 0.66 (norm) with Stage III chronic renal disease, normal calcium, and  anemia with Hgb of 12.8 g/dL. He has smoldering myeloma. He understands the significance of this.   If there are significant changes in his myeloma studies we will call. Otherwise will continue with ongoing follow-up. Plan is for return in 6 months.   The diagnostic approach to a patient with kidney disease and a monoclonal plasma or B cell disorder or monoclonal protein depends upon the clinical presentation of the patient. The overall goal of the evaluation is to determine whether a monoclonal protein is involved in the pathogenesis of the kidney disease. In most cases, a kidney biopsy is required to establish this association and to guide therapy.I have discussed this with him. Kidney function will be monitored closely, if any significant changes occur we can discuss biopsy with his  nephrologist.   All questions were  answered. The patient knows to call the clinic with any problems, questions or concerns. We can certainly see the patient much sooner if necessary.    This document serves as a record of services personally performed by Ancil Linsey, MD. It was created on her behalf by Toni Amend, a trained medical scribe. The creation of this record is based on the scribe's personal observations and the provider's statements to them. This document has been checked and approved by the attending provider.  I have reviewed the above documentation for accuracy and completeness and I agree with the above.  This note is electronically signed by:  Molli Hazard, MD  11/04/2015 12:30 PM

## 2015-11-04 NOTE — Patient Instructions (Signed)
[place] at [facility] Discharge Instructions  RECOMMENDATIONS MADE BY THE CONSULTANT AND ANY TEST RESULTS WILL BE SENT TO YOUR REFERRING PHYSICIAN.  Return in 6 months with lab work.   Thank you for choosing [place] at [facility] to provide your oncology and hematology care.  To afford each patient quality time with our provider, please arrive at least 15 minutes before your scheduled appointment time.   Beginning January 23rd 2017 lab work for the [facility] will be done in the  Main lab at [facility] on 1st floor. If you have a lab appointment with the [facility] please come in thru the  Main Entrance and check in at the main information desk  You need to re-schedule your appointment should you arrive 10 or more minutes late.  We strive to give you quality time with our providers, and arriving late affects you and other patients whose appointments are after yours.  Also, if you no show three or more times for appointments you may be dismissed from the clinic at the providers discretion.     Again, thank you for choosing [facility].  Our hope is that these requests will decrease the amount of time that you wait before being seen by our physicians.       _____________________________________________________________  Should you have questions after your visit to [facility], please contact our office at [phone] between the hours of 8:30 a.m. and 4:30 p.m.  Voicemails left after 4:30 p.m. will not be returned until the following business day.  For prescription refill requests, have your pharmacy contact our office.         Resources For Cancer Patients and their Caregivers ? American Cancer Society: Can assist with transportation, wigs, general needs, runs Look Good Feel Better.        2143526251 ? Cancer Care: Provides financial assistance, online support groups, medication/co-pay  assistance.  1-800-813-HOPE (602)187-1524) ? [facility] Assists [place] cancer patients and their families through emotional , educational and financial support.  385 006 1733 ? [place] DSS Where to apply for food stamps, Medicaid and utility assistance. 618-270-8667 ? RCATS: Transportation to medical appointments. 603-131-3529 ? Social Security Administration: May apply for disability if have a Stage IV cancer. (705)432-7900 872-537-7220 ? [employer], Disability and Transit Services: Assists with nutrition, care and transit needs. [place] Support Programs: @10RELATIVEDAYS @ > Cancer Support Group  2nd Tuesday of the month 1pm-2pm, Journey Room  > Creative Journey  3rd Tuesday of the month 1130am-1pm, Journey Room  > Look Good Feel Better  1st Wednesday of the month 10am-12 noon, Journey Room (Call [place] to register [phone])

## 2015-11-05 LAB — PROTEIN ELECTROPHORESIS, SERUM
A/G Ratio: 1 (ref 0.7–1.7)
ALPHA-1-GLOBULIN: 0.2 g/dL (ref 0.0–0.4)
Albumin ELP: 3.5 g/dL (ref 2.9–4.4)
Alpha-2-Globulin: 0.7 g/dL (ref 0.4–1.0)
Beta Globulin: 1.2 g/dL (ref 0.7–1.3)
GAMMA GLOBULIN: 1.4 g/dL (ref 0.4–1.8)
Globulin, Total: 3.4 g/dL (ref 2.2–3.9)
M-SPIKE, %: 0.8 g/dL — AB
TOTAL PROTEIN ELP: 6.9 g/dL (ref 6.0–8.5)

## 2015-11-05 LAB — KAPPA/LAMBDA LIGHT CHAINS
KAPPA FREE LGHT CHN: 26.2 mg/L — AB (ref 3.3–19.4)
Kappa, lambda light chain ratio: 0.57 (ref 0.26–1.65)
LAMDA FREE LIGHT CHAINS: 45.9 mg/L — AB (ref 5.7–26.3)

## 2015-11-10 LAB — IMMUNOFIXATION ELECTROPHORESIS
IGM, SERUM: 34 mg/dL (ref 20–172)
IgA: 71 mg/dL (ref 61–437)
IgG (Immunoglobin G), Serum: 1440 mg/dL (ref 700–1600)
Total Protein ELP: 7 g/dL (ref 6.0–8.5)

## 2015-11-28 ENCOUNTER — Encounter (HOSPITAL_COMMUNITY): Payer: Self-pay | Admitting: Hematology & Oncology

## 2015-12-02 ENCOUNTER — Telehealth: Payer: Self-pay | Admitting: Cardiology

## 2015-12-02 ENCOUNTER — Other Ambulatory Visit: Payer: Self-pay

## 2015-12-02 MED ORDER — SPIRONOLACTONE 25 MG PO TABS: 12.5000 mg | ORAL_TABLET | Freq: Every day | ORAL | 1 refills | Status: DC

## 2015-12-02 NOTE — Telephone Encounter (Signed)
°*  STAT* If patient is at the pharmacy, call can be transferred to refill team.   1. Which medications need to be refilled? (please list name of each medication and dose if known) Spironolactone 25mg **  2. Which pharmacy/location (including street and city if local pharmacy) is medication to be sent to? [facility] in [place]*  3. Do they need a 30 day or 90 day supply? 90 day    Patient needs Rx sent to [facility] in [place] for Spironolactone 25mg .   90 day preferred.  Patient has enough for 2 more days. He took his last dose this morning.

## 2015-12-03 DIAGNOSIS — R69 Illness, unspecified: Secondary | ICD-10-CM | POA: Diagnosis not present

## 2015-12-14 DIAGNOSIS — I5032 Chronic diastolic (congestive) heart failure: Secondary | ICD-10-CM | POA: Diagnosis not present

## 2015-12-14 DIAGNOSIS — R69 Illness, unspecified: Secondary | ICD-10-CM | POA: Diagnosis not present

## 2015-12-14 DIAGNOSIS — Z6830 Body mass index (BMI) 30.0-30.9, adult: Secondary | ICD-10-CM | POA: Diagnosis not present

## 2015-12-14 DIAGNOSIS — E1129 Type 2 diabetes mellitus with other diabetic kidney complication: Secondary | ICD-10-CM | POA: Diagnosis not present

## 2016-01-24 DIAGNOSIS — E119 Type 2 diabetes mellitus without complications: Secondary | ICD-10-CM | POA: Diagnosis not present

## 2016-01-24 DIAGNOSIS — E11319 Type 2 diabetes mellitus with unspecified diabetic retinopathy without macular edema: Secondary | ICD-10-CM | POA: Diagnosis not present

## 2016-01-24 DIAGNOSIS — H524 Presbyopia: Secondary | ICD-10-CM | POA: Diagnosis not present

## 2016-02-11 ENCOUNTER — Encounter: Payer: Self-pay | Admitting: Cardiology

## 2016-02-11 ENCOUNTER — Ambulatory Visit (INDEPENDENT_AMBULATORY_CARE_PROVIDER_SITE_OTHER): Payer: Medicare HMO | Admitting: Cardiology

## 2016-02-11 VITALS — BP 106/74 | HR 74 | Ht 68.0 in | Wt 199.0 lb

## 2016-02-11 DIAGNOSIS — I10 Essential (primary) hypertension: Secondary | ICD-10-CM

## 2016-02-11 DIAGNOSIS — I428 Other cardiomyopathies: Secondary | ICD-10-CM

## 2016-02-11 NOTE — Patient Instructions (Signed)
Your physician wants you to follow-up in: 6 months with Dr.McDowell You will receive a reminder letter in the mail two months in advance. If you don't receive a letter, please call our office to schedule the follow-up appointment.     Your physician recommends that you continue on your current medications as directed. Please refer to the Current Medication list given to you today.     Thank you for choosing [facility] !        

## 2016-02-11 NOTE — Progress Notes (Signed)
Cardiology Office Note  Date: 02/11/2016   ID: Corey Murphy, DOB October 15, 1950, MRN [id number]  PCP: Glo Herring., MD  Primary Cardiologist: Rozann Lesches, MD   No chief complaint on file.   History of Present Illness: Corey Murphy is a 65 y.o. male last seen in April. He is here today with his wife for a follow-up visit. States that he has gained about 15 pounds, thinks that this is due to laxation in his diet. We talked about reducing carbohydrate intake, also possibly mixing up his exercise plan. He does not report any chest pain with exertion and has stable NYHA class 1-2 dyspnea.  Follow-up echocardiogram from December 2016 is outlined below. LVEF normal range on medical therapy.  I reviewed his cardiac medications which are outlined below. There've been no significant changes. He continues to follow with Dr. Gerarda Fraction for primary care.  Past Medical History:  Diagnosis Date  . Alcohol dependency ([place])   . Colonic adenoma 2006  . Essential hypertension, benign   . History of cardiac catheterization    03/2013 LM nl, LAD nl, D1/2/3 small - nl, LCX nondom - nl, OM1/2/3 nl, RCA dom  . Hypercholesteremia   . Hypertensive heart disease   . 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 Nonischemic cardiomyopathy (HCC)    LVEF approximately 40% 06/2013 - improved to normal range  . Obesity   . Type 2 diabetes mellitus ([place])     Current Outpatient Prescriptions  Medication Sig Dispense Refill  . aspirin 81 MG tablet Take 81 mg by mouth daily.    . carvedilol (COREG) 12.5 MG tablet TAKE (1) TABLET BY MOUTH TWICE A DAY WITH FOOD. 60 tablet 6  . furosemide (LASIX) 20 MG tablet Take 1 tablet (20 mg total) by mouth as needed. 90 tablet 3  . glimepiride (AMARYL) 2 MG tablet Take 2 mg by mouth daily with breakfast. 2 tabs BID    . HYDROcodone-acetaminophen (NORCO) 10-325 MG tablet Take 1 tablet by mouth every 6 (six) hours as needed.    Marland Kitchen lisinopril (PRINIVIL,ZESTRIL) 2.5 MG  tablet Take 2.5 mg by mouth daily.    . ONE TOUCH ULTRA TEST test strip     . simvastatin (ZOCOR) 20 MG tablet TK 1 T PO D  3  . spironolactone (ALDACTONE) 25 MG tablet Take 0.5 tablets (12.5 mg total) by mouth daily. 90 tablet 1   No current facility-administered medications for this visit.    Allergies:  Review of patient's allergies indicates no known allergies.   Social History: The patient  reports that he quit smoking about 7 years ago. His smoking use included Cigarettes. He has a 12.00 pack-year smoking history. He does not have any smokeless tobacco history on file. He reports that he uses drugs, including Marijuana. He reports that he does not drink alcohol.   ROS:  Please see the history of present illness. Otherwise, complete review of systems is positive for some trouble with memory.  All other systems are reviewed and negative.   Physical Exam: VS:  BP 106/74   Pulse 74   Ht 5\' 8"  (1.727 m)   Wt 199 lb (90.3 kg)   SpO2 97%   BMI 30.26 kg/m , BMI Body mass index is 30.26 kg/m.  Wt Readings from Last 3 Encounters:  02/11/16 199 lb (90.3 kg)  11/04/15 201 lb (91.2 kg)  08/11/15 189 lb (85.7 kg)    Patient comfortable at rest.  HEENT: Conjunctiva and lids normal, oropharynx  clear.  Neck: Supple, no elevated JVP or carotid bruits, no thyromegaly.  Lungs: Clear to auscultation, nonlabored breathing at rest.  Cardiac: Regular rate and rhythm, no S3, no pericardial rub.  Abdomen: Soft, nontender, bowel sounds present, no guarding or rebound.  Extremities: No pitting edema, distal pulses 2+.  ECG: I personally reviewed the tracing from 12/17/2014 which showed sinus rhythm with prolonged PR interval and PVC.  Recent Labwork: 11/04/2015: ALT 33; AST 19; BUN 23; Creatinine, Ser 1.41; Hemoglobin 12.3; Platelets 250; Potassium 5.0; Sodium 137   Other Studies Reviewed Today:  Echocardiogram 04/27/2015: Study Conclusions  - Left ventricle: The cavity size was normal.  Wall thickness was  increased in a pattern of mild LVH. Systolic function was normal.  The estimated ejection fraction was in the range of 55% to 60%.  Doppler parameters are consistent with abnormal left ventricular  relaxation (grade 1 diastolic dysfunction). - Mitral valve: There was mild regurgitation.  Assessment and Plan:  1. Nonischemic cardiomyopathy with normalization of LVEF on medical therapy. He continues to remain clinically stable. Encouraged regular exercise, also diet and weight loss.  2. Essential hypertension, blood pressure is well controlled today.  Current medicines were reviewed with the patient today.  Orders Placed This Encounter  Procedures  . EKG 12-Lead    Disposition: Follow-up with me in 6 months.  Signed, Satira Sark, MD, [facility] 02/11/2016 10:08 AM    [place] [facility]. [address] [place], [place] [address] Phone: [phone]; Fax: [phone]

## 2016-03-07 ENCOUNTER — Other Ambulatory Visit: Payer: Self-pay | Admitting: Cardiology

## 2016-03-17 DIAGNOSIS — R69 Illness, unspecified: Secondary | ICD-10-CM | POA: Diagnosis not present

## 2016-04-10 DIAGNOSIS — N183 Chronic kidney disease, stage 3 (moderate): Secondary | ICD-10-CM | POA: Diagnosis not present

## 2016-04-10 DIAGNOSIS — M7712 Lateral epicondylitis, left elbow: Secondary | ICD-10-CM | POA: Diagnosis not present

## 2016-04-10 DIAGNOSIS — Z1389 Encounter for screening for other disorder: Secondary | ICD-10-CM | POA: Diagnosis not present

## 2016-04-10 DIAGNOSIS — Z6831 Body mass index (BMI) 31.0-31.9, adult: Secondary | ICD-10-CM | POA: Diagnosis not present

## 2016-04-10 DIAGNOSIS — I10 Essential (primary) hypertension: Secondary | ICD-10-CM | POA: Diagnosis not present

## 2016-04-10 DIAGNOSIS — E1129 Type 2 diabetes mellitus with other diabetic kidney complication: Secondary | ICD-10-CM | POA: Diagnosis not present

## 2016-05-08 ENCOUNTER — Encounter (HOSPITAL_COMMUNITY): Payer: Medicare HMO | Attending: Hematology & Oncology

## 2016-05-08 DIAGNOSIS — Z87891 Personal history of nicotine dependence: Secondary | ICD-10-CM | POA: Diagnosis not present

## 2016-05-08 DIAGNOSIS — C9000 Multiple myeloma not having achieved remission: Secondary | ICD-10-CM

## 2016-05-08 DIAGNOSIS — D472 Monoclonal gammopathy: Secondary | ICD-10-CM

## 2016-05-08 LAB — CBC WITH DIFFERENTIAL/PLATELET
Basophils Absolute: 0 10*3/uL (ref 0.0–0.1)
Basophils Relative: 1 %
EOS ABS: 0.1 10*3/uL (ref 0.0–0.7)
Eosinophils Relative: 1 %
HEMATOCRIT: 38 % — AB (ref 39.0–52.0)
HEMOGLOBIN: 12.5 g/dL — AB (ref 13.0–17.0)
LYMPHS ABS: 1.5 10*3/uL (ref 0.7–4.0)
Lymphocytes Relative: 36 %
MCH: 30.9 pg (ref 26.0–34.0)
MCHC: 32.9 g/dL (ref 30.0–36.0)
MCV: 94.1 fL (ref 78.0–100.0)
Monocytes Absolute: 0.3 10*3/uL (ref 0.1–1.0)
Monocytes Relative: 8 %
NEUTROS ABS: 2.3 10*3/uL (ref 1.7–7.7)
NEUTROS PCT: 54 %
Platelets: 226 10*3/uL (ref 150–400)
RBC: 4.04 MIL/uL — AB (ref 4.22–5.81)
RDW: 14.3 % (ref 11.5–15.5)
WBC: 4.3 10*3/uL (ref 4.0–10.5)

## 2016-05-08 LAB — COMPREHENSIVE METABOLIC PANEL
ALBUMIN: 3.8 g/dL (ref 3.5–5.0)
ALK PHOS: 70 U/L (ref 38–126)
ALT: 33 U/L (ref 17–63)
AST: 18 U/L (ref 15–41)
Anion gap: 6 (ref 5–15)
BILIRUBIN TOTAL: 0.5 mg/dL (ref 0.3–1.2)
BUN: 25 mg/dL — ABNORMAL HIGH (ref 6–20)
CALCIUM: 9.2 mg/dL (ref 8.9–10.3)
CO2: 29 mmol/L (ref 22–32)
CREATININE: 1.55 mg/dL — AB (ref 0.61–1.24)
Chloride: 103 mmol/L (ref 101–111)
GFR calc non Af Amer: 46 mL/min — ABNORMAL LOW (ref >60)
GFR, EST AFRICAN AMERICAN: 53 mL/min — AB (ref >60)
GLUCOSE: 147 mg/dL — AB (ref 65–99)
Potassium: 4.3 mmol/L (ref 3.5–5.1)
SODIUM: 138 mmol/L (ref 135–145)
Total Protein: 7.6 g/dL (ref 6.5–8.1)

## 2016-05-09 ENCOUNTER — Encounter (HOSPITAL_BASED_OUTPATIENT_CLINIC_OR_DEPARTMENT_OTHER): Payer: Medicare HMO | Admitting: Oncology

## 2016-05-09 ENCOUNTER — Ambulatory Visit (HOSPITAL_COMMUNITY)
Admission: RE | Admit: 2016-05-09 | Discharge: 2016-05-09 | Disposition: A | Discharge status: Home / Self Care | Payer: Medicare HMO | Source: Ambulatory Visit | Attending: Oncology | Admitting: Oncology

## 2016-05-09 ENCOUNTER — Other Ambulatory Visit (HOSPITAL_COMMUNITY): Payer: Medicare HMO

## 2016-05-09 VITALS — BP 126/71 | HR 76 | Temp 98.2°F | Resp 16 | Wt 202.3 lb

## 2016-05-09 DIAGNOSIS — M47892 Other spondylosis, cervical region: Secondary | ICD-10-CM | POA: Insufficient documentation

## 2016-05-09 DIAGNOSIS — D472 Monoclonal gammopathy: Secondary | ICD-10-CM | POA: Diagnosis not present

## 2016-05-09 DIAGNOSIS — C9000 Multiple myeloma not having achieved remission: Secondary | ICD-10-CM

## 2016-05-09 DIAGNOSIS — M47894 Other spondylosis, thoracic region: Secondary | ICD-10-CM | POA: Diagnosis not present

## 2016-05-09 DIAGNOSIS — M47896 Other spondylosis, lumbar region: Secondary | ICD-10-CM | POA: Insufficient documentation

## 2016-05-09 DIAGNOSIS — Z8579 Personal history of other malignant neoplasms of lymphoid, hematopoietic and related tissues: Secondary | ICD-10-CM | POA: Diagnosis not present

## 2016-05-09 LAB — PROTEIN ELECTROPHORESIS, SERUM
A/G Ratio: 1.1 (ref 0.7–1.7)
Albumin ELP: 3.7 g/dL (ref 2.9–4.4)
Alpha-1-Globulin: 0.2 g/dL (ref 0.0–0.4)
Alpha-2-Globulin: 0.6 g/dL (ref 0.4–1.0)
BETA GLOBULIN: 1.1 g/dL (ref 0.7–1.3)
GAMMA GLOBULIN: 1.3 g/dL (ref 0.4–1.8)
Globulin, Total: 3.3 g/dL (ref 2.2–3.9)
M-SPIKE, %: 0.8 g/dL — AB
PDF: 0
Total Protein ELP: 7 g/dL (ref 6.0–8.5)

## 2016-05-09 LAB — KAPPA/LAMBDA LIGHT CHAINS
Kappa free light chain: 24.7 mg/L — ABNORMAL HIGH (ref 3.3–19.4)
Kappa, lambda light chain ratio: 0.64 (ref 0.26–1.65)
Lambda free light chains: 38.8 mg/L — ABNORMAL HIGH (ref 5.7–26.3)

## 2016-05-09 NOTE — Progress Notes (Signed)
Glo Herring., MD [place] [address]  Smoldering myeloma [facility]) - Plan: DG Bone Survey Met  IgG lambda monoclonal gammopathy - Plan: DG Bone Survey Met  CURRENT THERAPY: Surveillance  INTERVAL HISTORY: Corey Murphy 66 y.o. male returns for followup of smoldering multiple myeloma, IgG lambda light chain specificity.  He denies any complaints today.  He denies any new pains.  He denies any mental status changes.  He denies any new falls or fractures.  He is going to [place], [place] for the final round of the [employer].  I reviewed surveillance guidelines in accordance with NCCN guidelines.  Review of Systems  Constitutional: Negative for chills, fever and weight loss.  HENT: Negative.   Eyes: Negative.   Respiratory: Negative.  Negative for cough.   Cardiovascular: Negative.  Negative for chest pain.  Gastrointestinal: Negative.  Negative for constipation, nausea and vomiting.  Genitourinary: Negative.  Negative for dysuria.  Musculoskeletal: Negative.   Skin: Negative.  Negative for rash.  Neurological: Negative.  Negative for weakness.  Endo/Heme/Allergies: Negative.   Psychiatric/Behavioral: Negative.     Past Medical History:  Diagnosis Date  . Alcohol dependency ([place])   . Colonic adenoma 2006  . Essential hypertension, benign   . History of cardiac catheterization    03/2013 LM nl, LAD nl, D1/2/3 small - nl, LCX nondom - nl, OM1/2/3 nl, RCA dom  . Hypercholesteremia   . Hypertensive heart disease   . 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 Nonischemic cardiomyopathy (HCC)    LVEF approximately 40% 06/2013 - improved to normal range  . Obesity   . Type 2 diabetes mellitus ([place])     Past Surgical History:  Procedure Laterality Date  . COLONOSCOPY  05/20/2004   RMR: Internal hemorrhoids.  Diminutive adenomatous polyp in the mid-right colon, otherwise normal  . COLONOSCOPY N/A 06/19/2012   Procedure:  COLONOSCOPY;  Surgeon: Daneil Dolin, MD;  [facility];  Service: Endoscopy;  Laterality: N/A;  8:30AM-rescheduled 10:30am Darius Bump notified pt  . Ileocolonoscopy  06/28/2007   [id number] rectum/Submucosal sigmoid diverticula (chronic), doubt clinical significance Hyperplastic polypoid-appearing fold, mid ascending colon, status/Remainder colonic mucosa and terminal ileum mucosa appeared normal   . LEFT AND RIGHT HEART CATHETERIZATION WITH CORONARY ANGIOGRAM  03/19/2013   Procedure: LEFT AND RIGHT HEART CATHETERIZATION WITH CORONARY ANGIOGRAM;  Surgeon: Wellington Hampshire, MD;  Location: [facility] CATH LAB;  Service: Cardiovascular;;  . TOTAL HIP ARTHROPLASTY  2010   Left  . TOTAL HIP ARTHROPLASTY  2010   Right    Family History  Problem Relation Age of Onset  . Colon cancer Neg Hx   . Heart failure Mother   . Diabetes Mother   . Hypertension Mother   . Heart failure Sister   . Hypertension Sister   . Cancer Sister     Social History   Social History  . Marital status: Married    Spouse name: N/A  . Number of children: 2  . Years of education: N/A   Occupational History  . Retired, Psychologist, educational    Social History Main Topics  . Smoking status: Former Smoker    Packs/day: 1.00    Years: 12.00    Types: Cigarettes    Quit date: 05/28/2008  . Smokeless tobacco: Not on file  . Alcohol use No     Comment: Quit in 2008.  Used to drink 12-14 beers on weekends only  . Drug use:  Types: Marijuana     Comment: Smoked via a bowl occassionally.  . Sexual activity: Not on file   Other Topics Concern  . Not on file   Social History Narrative  . No narrative on file     PHYSICAL EXAMINATION  ECOG PERFORMANCE STATUS: 0 - Asymptomatic  Vitals:   05/09/16 1258  BP: 126/71  Pulse: 76  Resp: 16  Temp: 98.2 F (36.8 C)    GENERAL:alert, healthy, no distress, well nourished, well developed, comfortable, cooperative, smiling and accompanied by wife. SKIN: skin  color, texture, turgor are normal, no rashes or significant lesions HEAD: Normocephalic, No masses, lesions, tenderness or abnormalities EYES: normal, EOMI, Conjunctiva are pink and non-injected EARS: External ears normal OROPHARYNX:lips, buccal mucosa, and tongue normal and mucous membranes are moist  NECK: supple, trachea midline LYMPH:  not examined BREAST:not examined LUNGS: clear to auscultation  HEART: regular rate & rhythm ABDOMEN:abdomen soft and normal bowel sounds BACK: Back symmetric, no curvature. EXTREMITIES:less then 2 second capillary refill, no joint deformities, effusion, or inflammation, no skin discoloration, no cyanosis  NEURO: alert & oriented x 3 with fluent speech, no focal motor/sensory deficits, gait normal   LABORATORY DATA: CBC    Component Value Date/Time   WBC 4.3 05/08/2016 1243   RBC 4.04 (L) 05/08/2016 1243   HGB 12.5 (L) 05/08/2016 1243   HCT 38.0 (L) 05/08/2016 1243   PLT 226 05/08/2016 1243   MCV 94.1 05/08/2016 1243   MCH 30.9 05/08/2016 1243   MCHC 32.9 05/08/2016 1243   RDW 14.3 05/08/2016 1243   LYMPHSABS 1.5 05/08/2016 1243   MONOABS 0.3 05/08/2016 1243   EOSABS 0.1 05/08/2016 1243   BASOSABS 0.0 05/08/2016 1243      Chemistry      Component Value Date/Time   NA 138 05/08/2016 1243   K 4.3 05/08/2016 1243   CL 103 05/08/2016 1243   CO2 29 05/08/2016 1243   BUN 25 (H) 05/08/2016 1243   CREATININE 1.55 (H) 05/08/2016 1243   CREATININE 1.49 (H) 07/15/2014 1423      Component Value Date/Time   CALCIUM 9.2 05/08/2016 1243   ALKPHOS 70 05/08/2016 1243   AST 18 05/08/2016 1243   ALT 33 05/08/2016 1243   BILITOT 0.5 05/08/2016 1243        PENDING LABS:   RADIOGRAPHIC STUDIES:  No results found.   PATHOLOGY:    ASSESSMENT AND PLAN:  IgG lambda monoclonal gammopathy IgG monoclonal protein with lambda light chain specificity with a negative bone survey, and bone marrow aspiration and biopsy on 07/08/2015 showing 10-20%  plasma cells (lambda restricted) in the setting of stage III chronic renal disease, normal calcium, and trace anemia with Hgb of 12.5 g/dL.  Labs today: CBC diff, CMET, SPEP +IFE, UPEP+IFE.  I personally reviewed and went over laboratory results with the patient.  The results are noted within this dictation.  He will go home today with 24 hour urine collection container for UPEP+IFE.  Labs in 6 months: CBC diff, CMET, SPEP+IFE.  The diagnostic approach to a patient with kidney disease and a monoclonal plasma or B cell disorder or monoclonal protein depends upon the clinical presentation of the patient. The overall goal of the evaluation is to determine whether a monoclonal protein is involved in the pathogenesis of the kidney disease. In most cases, a kidney biopsy is required to establish this association and to guide therapy.I have discussed this with him. Kidney function will be monitored closely, if any  significant changes occur we can discuss biopsy with his nephrologist.   Skeletal survey is also ordered for annual surveillance.  This is due in Feb 2018.  Return in 6 months for follow-up.   ORDERS PLACED FOR THIS ENCOUNTER: Orders Placed This Encounter  Procedures  . DG Bone Survey Met    MEDICATIONS PRESCRIBED THIS ENCOUNTER: No orders of the defined types were placed in this encounter.   THERAPY PLAN:  Ongoing surveillance for smoldering myeloma.  Smoldering multiple myeloma - Smoldering multiple myeloma (SMM) is defined as:  ?M-protein ?3 g/dL and/or 10 to 60 percent bone marrow plasma cells, plus  ?No end-organ damage or other myeloma-defining events, and no amyloidosis  Thus, for the diagnosis of SMM, patients should not have any of the following myeloma-defining events:  ?End-organ damage (lytic lesions, anemia, renal disease, or hypercalcemia) that can be attributed to the underlying plasma cell disorder  ??60 percent clonal plasma cells in the bone marrow    ?Involved/uninvolved free light chain (FLC) ratio of 100 or more   ?Magnetic resonance imaging (MRI) with more than one focal lesion (involving bone or bone marrow) Asymptomatic patients with one or more of the myeloma-defining events listed above are considered to have MM rather than SMM because they have a risk of progression with complications of greater than 80 percent within two years.   All questions were answered. The patient knows to call the clinic with any problems, questions or concerns. We can certainly see the patient much sooner if necessary.  Patient and plan discussed with Dr. Ancil Linsey and she is in agreement with the aforementioned.   This note is electronically signed by: Doy Mince 05/09/2016 1:43 PM

## 2016-05-09 NOTE — Assessment & Plan Note (Addendum)
IgG monoclonal protein with lambda light chain specificity with a negative bone survey, and bone marrow aspiration and biopsy on 07/08/2015 showing 10-20% plasma cells (lambda restricted) in the setting of stage III chronic renal disease, normal calcium, and trace anemia with Hgb of 12.5 g/dL.  Labs today: CBC diff, CMET, SPEP +IFE, UPEP+IFE.  I personally reviewed and went over laboratory results with the patient.  The results are noted within this dictation.  He will go home today with 24 hour urine collection container for UPEP+IFE.  Labs in 6 months: CBC diff, CMET, SPEP+IFE.  The diagnostic approach to a patient with kidney disease and a monoclonal plasma or B cell disorder or monoclonal protein depends upon the clinical presentation of the patient. The overall goal of the evaluation is to determine whether a monoclonal protein is involved in the pathogenesis of the kidney disease. In most cases, a kidney biopsy is required to establish this association and to guide therapy.I have discussed this with him. Kidney function will be monitored closely, if any significant changes occur we can discuss biopsy with his nephrologist.   Skeletal survey is also ordered for annual surveillance.  This is due in Feb 2018.  Return in 6 months for follow-up.

## 2016-05-09 NOTE — Patient Instructions (Addendum)
[place] at [facility] Discharge Instructions  RECOMMENDATIONS MADE BY THE CONSULTANT AND ANY TEST RESULTS WILL BE SENT TO YOUR REFERRING PHYSICIAN.  You saw Kirby Crigler, PA-C, today. Skeletal survey today. 24 hour urine collection before next visit. Follow up and labs in 6 months. See Amy at checkout for appointments.  Thank you for choosing [place] at [facility] to provide your oncology and hematology care.  To afford each patient quality time with our provider, please arrive at least 15 minutes before your scheduled appointment time.    If you have a lab appointment with the [facility] please come in thru the  Main Entrance and check in at the main information desk  You need to re-schedule your appointment should you arrive 10 or more minutes late.  We strive to give you quality time with our providers, and arriving late affects you and other patients whose appointments are after yours.  Also, if you no show three or more times for appointments you may be dismissed from the clinic at the providers discretion.     Again, thank you for choosing [facility].  Our hope is that these requests will decrease the amount of time that you wait before being seen by our physicians.       _____________________________________________________________  Should you have questions after your visit to [facility], please contact our office at [phone] between the hours of 8:30 a.m. and 4:30 p.m.  Voicemails left after 4:30 p.m. will not be returned until the following business day.  For prescription refill requests, have your pharmacy contact our office.       Resources For Cancer Patients and their Caregivers ? American Cancer Society: Can assist with transportation, wigs, general needs, runs Look Good Feel Better.        737-036-7683 ? Cancer Care: Provides financial assistance, online support groups,  medication/co-pay assistance.  1-800-813-HOPE 931-710-5854) ? [place] Assists [place] cancer patients and their families through emotional , educational and financial support.  907-372-8256 ? [place] DSS Where to apply for food stamps, Medicaid and utility assistance. 786-603-0055 ? [facility]: Transportation to medical appointments. 908-077-1255 ? Social Security Administration: May apply for disability if have a Stage IV cancer. 606-775-2819 989-777-9340 ? [employer], Disability and Transit Services: Assists with nutrition, care and transit needs. [employer] Support Programs: @10RELATIVEDAYS @ > Cancer Support Group  2nd Tuesday of the month 1pm-2pm, Journey Room  > Creative Journey  3rd Tuesday of the month 1130am-1pm, Journey Room  > Look Good Feel Better  1st Wednesday of the month 10am-12 noon, Journey Room (Call [facility] to register [phone])

## 2016-05-10 LAB — IMMUNOFIXATION ELECTROPHORESIS
IGA: 81 mg/dL (ref 61–437)
IGG (IMMUNOGLOBIN G), SERUM: 1611 mg/dL — AB (ref 700–1600)
IgM, Serum: 32 mg/dL (ref 20–172)
TOTAL PROTEIN ELP: 7.3 g/dL (ref 6.0–8.5)

## 2016-05-16 ENCOUNTER — Other Ambulatory Visit (HOSPITAL_COMMUNITY): Payer: Self-pay

## 2016-05-16 DIAGNOSIS — Z87891 Personal history of nicotine dependence: Secondary | ICD-10-CM | POA: Diagnosis not present

## 2016-05-16 DIAGNOSIS — D472 Monoclonal gammopathy: Secondary | ICD-10-CM

## 2016-05-16 DIAGNOSIS — C9000 Multiple myeloma not having achieved remission: Secondary | ICD-10-CM | POA: Diagnosis not present

## 2016-05-17 LAB — IMMUNOFIXATION, URINE

## 2016-05-19 LAB — UIFE/LIGHT CHAINS/TP QN, 24-HR UR
% BETA, URINE: 30.9 %
ALPHA 1 URINE: 5 %
ALPHA 2 UR: 17.7 %
Albumin, U: 15.5 %
FREE LT CHN EXCR RATE: 33.2 mg/L — AB (ref 1.35–24.19)
Free Kappa/Lambda Ratio: 4.93 (ref 2.04–10.37)
Free Lambda Lt Chains,Ur: 6.74 mg/L — ABNORMAL HIGH (ref 0.24–6.66)
GAMMA GLOBULIN URINE: 30.9 %
PDF: 0
TOTAL PROTEIN, URINE-UPE24: 5.5 mg/dL
TOTAL PROTEIN, URINE-UR/DAY: 105 mg/(24.h) (ref 30–150)
Total Volume: 1900

## 2016-05-30 DIAGNOSIS — E119 Type 2 diabetes mellitus without complications: Secondary | ICD-10-CM | POA: Diagnosis not present

## 2016-05-30 DIAGNOSIS — I10 Essential (primary) hypertension: Secondary | ICD-10-CM | POA: Diagnosis not present

## 2016-05-30 DIAGNOSIS — R778 Other specified abnormalities of plasma proteins: Secondary | ICD-10-CM | POA: Diagnosis not present

## 2016-05-30 DIAGNOSIS — N183 Chronic kidney disease, stage 3 (moderate): Secondary | ICD-10-CM | POA: Diagnosis not present

## 2016-06-15 ENCOUNTER — Other Ambulatory Visit: Payer: Self-pay | Admitting: Cardiology

## 2016-06-15 DIAGNOSIS — R69 Illness, unspecified: Secondary | ICD-10-CM | POA: Diagnosis not present

## 2016-06-23 ENCOUNTER — Encounter: Payer: Self-pay | Admitting: Cardiology

## 2016-08-08 NOTE — Progress Notes (Signed)
Cardiology Office Note  Date: 08/09/2016   ID: Corey Murphy, DOB 04/10/1951, MRN [id number]  PCP: Glo Herring, MD  Primary Cardiologist: Rozann Lesches, MD   Chief Complaint  Patient presents with  . History of cardiomyopathy    History of Present Illness: Corey Murphy is a 66 y.o. male last seen in October 2017. He is here today with his wife for a follow-up visit. Continues to do very well. He is exercising for 90 minutes at the gym 5 days a week. Reports NYHA class I dyspnea with most routine activities. No exertional chest pain.  I reviewed his medications which are stable from a cardiac perspective. He reports no intolerances. Current cardiac regimen includes aspirin, Coreg, Lasix, lisinopril, Aldactone, and Zocor.  His last echocardiogram was in December 2016 as outlined below. Not due for follow-up testing as yet.  He continues to follow with Dr. Gerarda Fraction, also oncology with smoldering lymphoma.  Past Medical History:  Diagnosis Date  . Alcohol dependency ([place])   . Colonic adenoma 2006  . Essential hypertension, benign   . History of cardiac catheterization    03/2013 LM nl, LAD nl, D1/2/3 small - nl, LCX nondom - nl, OM1/2/3 nl, RCA dom  . Hypercholesteremia   . Hypertensive heart disease   . 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 Nonischemic cardiomyopathy (HCC)    LVEF approximately 40% 06/2013 - improved to normal range  . Obesity   . Type 2 diabetes mellitus ([place])     Past Surgical History:  Procedure Laterality Date  . COLONOSCOPY  05/20/2004   RMR: Internal hemorrhoids.  Diminutive adenomatous polyp in the mid-right colon, otherwise normal  . COLONOSCOPY N/A 06/19/2012   Procedure: COLONOSCOPY;  Surgeon: Daneil Dolin, MD;  [facility];  Service: Endoscopy;  Laterality: N/A;  8:30AM-rescheduled 10:30am Darius Bump notified pt  . Ileocolonoscopy  06/28/2007   [id number] rectum/Submucosal sigmoid diverticula (chronic), doubt clinical  significance Hyperplastic polypoid-appearing fold, mid ascending colon, status/Remainder colonic mucosa and terminal ileum mucosa appeared normal   . LEFT AND RIGHT HEART CATHETERIZATION WITH CORONARY ANGIOGRAM  03/19/2013   Procedure: LEFT AND RIGHT HEART CATHETERIZATION WITH CORONARY ANGIOGRAM;  Surgeon: Wellington Hampshire, MD;  Location: [facility] CATH LAB;  Service: Cardiovascular;;  . TOTAL HIP ARTHROPLASTY  2010   Left  . TOTAL HIP ARTHROPLASTY  2010   Right    Current Outpatient Prescriptions  Medication Sig Dispense Refill  . aspirin 81 MG tablet Take 81 mg by mouth daily.    . carvedilol (COREG) 12.5 MG tablet TAKE 1 TABLET BY MOUTH TWICE DAILY WITH FOOD 180 tablet 1  . furosemide (LASIX) 20 MG tablet Take 1 tablet (20 mg total) by mouth as needed. 90 tablet 3  . glimepiride (AMARYL) 2 MG tablet Take 2 mg by mouth daily with breakfast. 2 tabs BID    . HYDROcodone-acetaminophen (NORCO) 10-325 MG tablet Take 1 tablet by mouth every 6 (six) hours as needed.    Marland Kitchen lisinopril (PRINIVIL,ZESTRIL) 2.5 MG tablet Take 2.5 mg by mouth daily.    . ONE TOUCH ULTRA TEST test strip     . simvastatin (ZOCOR) 20 MG tablet TK 1 T PO D  3  . spironolactone (ALDACTONE) 25 MG tablet Take 0.5 tablets (12.5 mg total) by mouth daily. 90 tablet 1   No current facility-administered medications for this visit.    Allergies:  Patient has no known allergies.   Social History: The patient  reports that  he quit smoking about 8 years ago. His smoking use included Cigarettes. He has a 12.00 pack-year smoking history. He has never used smokeless tobacco. He reports that he uses drugs, including Marijuana. He reports that he does not drink alcohol.  ROS:  Please see the history of present illness. Otherwise, complete review of systems is positive for none.  All other systems are reviewed and negative.   Physical Exam: VS:  BP 104/60   Pulse 77   Ht 5' 8" (1.727 m)   Wt 196 lb (88.9 kg)   SpO2 97%   BMI 29.80 kg/m ,  BMI Body mass index is 29.8 kg/m.  Wt Readings from Last 3 Encounters:  08/09/16 196 lb (88.9 kg)  05/09/16 202 lb 4.8 oz (91.8 kg)  02/11/16 199 lb (90.3 kg)    Patient comfortable at rest.  HEENT: Conjunctiva and lids normal, oropharynx clear.  Neck: Supple, no elevated JVP or carotid bruits, no thyromegaly.  Lungs: Clear to auscultation, nonlabored breathing at rest.  Cardiac: Regular rate and rhythm, no S3, no pericardial rub.  Abdomen: Soft, nontender, bowel sounds present, no guarding or rebound.  Extremities: No pitting edema, distal pulses 2+. Skin: Warm and dry. Musculoskeletal: No kyphosis. Neuropsychiatric: Alert and oriented 3, affect appropriate.  ECG: I personally reviewed the tracing from 02/11/2016 which showed sinus rhythm with prolonged PR interval.  Recent Labwork: 05/08/2016: ALT 33; AST 18; BUN 25; Creatinine, Ser 1.55; Hemoglobin 12.5; Platelets 226; Potassium 4.3; Sodium 138     Component Value Date/Time   CHOL 144 03/19/2013 0428   TRIG 110 03/19/2013 0428   HDL 49 03/19/2013 0428   CHOLHDL 2.9 03/19/2013 0428   VLDL 22 03/19/2013 0428   LDLCALC 73 03/19/2013 0428    Other Studies Reviewed Today:  Echocardiogram 04/27/2015: Study Conclusions  - Left ventricle: The cavity size was normal. Wall thickness was  increased in a pattern of mild LVH. Systolic function was normal.  The estimated ejection fraction was in the range of 55% to 60%.  Doppler parameters are consistent with abnormal left ventricular  relaxation (grade 1 diastolic dysfunction). - Mitral valve: There was mild regurgitation.  Assessment and Plan:  1. History of nonischemic cardiomyopathy with normalization of LVEF on medical therapy. He is doing very well from a clinical perspective, no changes made in present regimen. No indication for follow-up echocardiogram as yet. Continue with regular exercise plan.  2. Essential hypertension, blood pressure is well controlled  today. Continue current regimen.  3. Smoldering multiple myeloma, IgG lambda light chain specificity. Patient continues to follow with oncology and is asymptomatic. Reviewed his most recent lab work.  4. CKD, stage 2-3. Creatinine 1.5 in January. He follows with nephrology.  Current medicines were reviewed with the patient today.  Disposition: Follow-up in 6 months.  Signed, Samuel G. McDowell, MD, FACC 08/09/2016 11:25 AM    [facility] at [place] [address], [place], [place] [address] Phone: [phone]; Fax: [phone]  

## 2016-08-09 ENCOUNTER — Ambulatory Visit (INDEPENDENT_AMBULATORY_CARE_PROVIDER_SITE_OTHER): Payer: Medicare HMO | Admitting: Cardiology

## 2016-08-09 ENCOUNTER — Encounter: Payer: Self-pay | Admitting: Cardiology

## 2016-08-09 VITALS — BP 104/60 | HR 77 | Ht 68.0 in | Wt 196.0 lb

## 2016-08-09 DIAGNOSIS — C9000 Multiple myeloma not having achieved remission: Secondary | ICD-10-CM | POA: Diagnosis not present

## 2016-08-09 DIAGNOSIS — N183 Chronic kidney disease, stage 3 unspecified: Secondary | ICD-10-CM

## 2016-08-09 DIAGNOSIS — I10 Essential (primary) hypertension: Secondary | ICD-10-CM | POA: Diagnosis not present

## 2016-08-09 DIAGNOSIS — I428 Other cardiomyopathies: Secondary | ICD-10-CM | POA: Diagnosis not present

## 2016-08-09 DIAGNOSIS — D472 Monoclonal gammopathy: Secondary | ICD-10-CM

## 2016-08-09 NOTE — Patient Instructions (Signed)
Your physician wants you to follow-up in: 6 months You will receive a reminder letter in the mail two months in advance. If you don't receive a letter, please call our office to schedule the follow-up appointment.     Your physician recommends that you continue on your current medications as directed. Please refer to the Current Medication list given to you today.      Thank you for choosing [facility] !        

## 2016-08-10 ENCOUNTER — Ambulatory Visit: Payer: Medicare HMO | Admitting: Cardiology

## 2016-09-05 ENCOUNTER — Telehealth: Payer: Self-pay | Admitting: Cardiology

## 2016-09-05 DIAGNOSIS — R0602 Shortness of breath: Secondary | ICD-10-CM

## 2016-09-05 NOTE — Telephone Encounter (Signed)
Will forward to Dr. McDowell 

## 2016-09-05 NOTE — Telephone Encounter (Signed)
Echo scheduled for 5/9 (Wednesday) at 1130  Pt aware to double lasix dose to 40 mg-he was taking 20 mg daily and will have echo tomorrow

## 2016-09-05 NOTE — Telephone Encounter (Signed)
Has he been taking Lasix? This was indicated as PRN medication. If he has not been taking it, suggest that he take Lasix 40 mg daily for the next few days until his visit on Thursday. Would also recommend scheduling an echocardiogram, his last study was a few years ago with normal LVEF, but he does have a history of nonischemic cardiomyopathy.

## 2016-09-05 NOTE — Telephone Encounter (Signed)
Per pt phone call--having the same symptoms he had when he was diagnosed w/ CHF. Swelling, SOB and cough, would like to speak w/ someone

## 2016-09-05 NOTE — Telephone Encounter (Signed)
Patient read instructions regarding "yellow zone" on discharge papers.He has had 5 lbs wt gain "or more"  over the past week, cannot lie flat without SOB, hacking cough, no LE edema.Front desk had already booked open apt you had for this Thursday as there are no APP apt's I am told.

## 2016-09-05 NOTE — Telephone Encounter (Signed)
Rather than simply forwarding this to me, please call the patient and speak with him about his clinical symptoms. If he is reporting worsening since I saw him in April (at which time he was stable), perhaps he should be seen for an APP visit this week. Medications may need to be adjusted.

## 2016-09-06 ENCOUNTER — Ambulatory Visit (HOSPITAL_COMMUNITY)
Admission: RE | Admit: 2016-09-06 | Discharge: 2016-09-06 | Disposition: A | Discharge status: Home / Self Care | Payer: Medicare HMO | Source: Ambulatory Visit | Attending: Cardiology | Admitting: Cardiology

## 2016-09-06 DIAGNOSIS — R0602 Shortness of breath: Secondary | ICD-10-CM | POA: Diagnosis not present

## 2016-09-06 DIAGNOSIS — I5189 Other ill-defined heart diseases: Secondary | ICD-10-CM | POA: Diagnosis not present

## 2016-09-06 LAB — ECHOCARDIOGRAM COMPLETE
AVLVOTPG: 2 mmHg
CHL CUP DOP CALC LVOT VTI: 17.1 cm
CHL CUP MV DEC (S): 190
E decel time: 190 msec
E/e' ratio: 9.89
FS: 35 % (ref 28–44)
IVS/LV PW RATIO, ED: 0.96
LA ID, A-P, ES: 43 mm
LA diam index: 2.06 cm/m2
LA vol index: 23.5 mL/m2
LAVOL: 49.1 mL
LAVOLA4C: 46.9 mL
LEFT ATRIUM END SYS DIAM: 43 mm
LV PW d: 13.5 mm — AB (ref 0.6–1.1)
LV SIMPSON'S DISK: 56
LV TDI E'LATERAL: 6.31
LV dias vol index: 42 mL/m2
LV dias vol: 88 mL (ref 62–150)
LV e' LATERAL: 6.31 cm/s
LVEEAVG: 9.89
LVEEMED: 9.89
LVOT area: 3.14 cm2
LVOTD: 20 mm
LVOTPV: 74.2 cm/s
LVOTSV: 54 mL
LVSYSVOL: 39 mL
LVSYSVOLIN: 19 mL/m2
Lateral S' vel: 12.8 cm/s
MVPKAVEL: 77.8 m/s
MVPKEVEL: 62.4 m/s
Stroke v: 49 ml
TAPSE: 18.1 mm
TDI e' medial: 10.2

## 2016-09-06 NOTE — Progress Notes (Signed)
*  PRELIMINARY RESULTS* Echocardiogram 2D Echocardiogram has been performed.  Corey Murphy 09/06/2016, 12:07 PM

## 2016-09-06 NOTE — Progress Notes (Signed)
Cardiology Office Note  Date: 09/07/2016   ID: Corey Murphy, DOB December 01, 1950, MRN [id number]  PCP: Redmond School, MD  Primary Cardiologist: Rozann Lesches, MD   Chief Complaint  Patient presents with  . Cardiac follow-up    History of Present Illness: Corey Murphy is a 66 y.o. male seen recently in April. He was clinically stable at that time. Recent telephone notes reviewed, patient describing weight gain of 5 pounds and orthopnea, no leg swelling. Follow-up echocardiogram was obtained as outlined below and overall reassuring. Lasix dose was also increased from 20 mg to 40 mg daily. He presents today with his wife for a follow-up visit. States that he feels better. Also tells me that he had other symptoms that sound more like a URI or allergies in the setting of his weight gain. We went over the results of his echocardiogram. Plan will be to continue present regimen, returning to low-dose Lasix and using higher dose intermittently with weight gain.  Past Medical History:  Diagnosis Date  . Alcohol dependency ([place])   . Colonic adenoma 2006  . Essential hypertension, benign   . History of cardiac catheterization    03/2013 LM nl, LAD nl, D1/2/3 small - nl, LCX nondom - nl, OM1/2/3 nl, RCA dom  . Hypercholesteremia   . Hypertensive heart disease   . 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 Nonischemic cardiomyopathy (H[facility])    LVEF approximately 40% 06/2013 - improved to normal range  . Obesity   . Type 2 diabetes mellitus ([place])     Past Surgical History:  Procedure Laterality Date  . COLONOSCOPY  05/20/2004   RMR: Internal hemorrhoids.  Diminutive adenomatous polyp in the mid-right colon, otherwise normal  . COLONOSCOPY N/A 06/19/2012   Procedure: COLONOSCOPY;  Surgeon: Daneil Dolin, MD;  [facility];  Service: Endoscopy;  Laterality: N/A;  8:30AM-rescheduled 10:30am Darius Bump notified pt  . Ileocolonoscopy  06/28/2007   YYQ:MGNOIB rectum/Submucosal  sigmoid diverticula (chronic), doubt clinical significance Hyperplastic polypoid-appearing fold, mid ascending colon, status/Remainder colonic mucosa and terminal ileum mucosa appeared normal   . LEFT AND RIGHT HEART CATHETERIZATION WITH CORONARY ANGIOGRAM  03/19/2013   Procedure: LEFT AND RIGHT HEART CATHETERIZATION WITH CORONARY ANGIOGRAM;  Surgeon: Wellington Hampshire, MD;  Location: [place] [facility] CATH LAB;  Service: Cardiovascular;;  . TOTAL HIP ARTHROPLASTY  2010   Left  . TOTAL HIP ARTHROPLASTY  2010   Right    Current Outpatient Prescriptions  Medication Sig Dispense Refill  . aspirin 81 MG tablet Take 81 mg by mouth daily.    . carvedilol (COREG) 12.5 MG tablet TAKE 1 TABLET BY MOUTH TWICE DAILY WITH FOOD 180 tablet 1  . furosemide (LASIX) 20 MG tablet Take 1 tablet (20 mg total) by mouth as needed. 90 tablet 3  . glimepiride (AMARYL) 2 MG tablet Take 2 mg by mouth daily with breakfast. 2 tabs BID    . HYDROcodone-acetaminophen (NORCO) 10-325 MG tablet Take 1 tablet by mouth every 6 (six) hours as needed.    Marland Kitchen lisinopril (PRINIVIL,ZESTRIL) 2.5 MG tablet Take 2.5 mg by mouth daily.    . ONE TOUCH ULTRA TEST test strip     . simvastatin (ZOCOR) 20 MG tablet TK 1 T PO D  3  . spironolactone (ALDACTONE) 25 MG tablet Take 0.5 tablets (12.5 mg total) by mouth daily. 90 tablet 1   No current facility-administered medications for this visit.    Allergies:  Patient has no known allergies.  Social History: The patient  reports that he quit smoking about 8 years ago. His smoking use included Cigarettes. He has a 12.00 pack-year smoking history. He has never used smokeless tobacco. He reports that he uses drugs, including Marijuana. He reports that he does not drink alcohol.   ROS:  Please see the history of present illness. Otherwise, complete review of systems is positive for upper airway congestion with intermittent cough.  All other systems are reviewed and negative.   Physical Exam: VS:  BP 114/72    Pulse 84   Ht 5\' 8"  (1.727 m)   Wt 191 lb (86.6 kg)   SpO2 98%   BMI 29.04 kg/m , BMI Body mass index is 29.04 kg/m.  Wt Readings from Last 3 Encounters:  09/07/16 191 lb (86.6 kg)  08/09/16 196 lb (88.9 kg)  05/09/16 202 lb 4.8 oz (91.8 kg)    Patient comfortable at rest.  HEENT: Conjunctiva and lids normal, oropharynx clear.  Neck: Supple, no elevated JVP or carotid bruits, no thyromegaly.  Lungs: Clear to auscultation, nonlabored breathing at rest.  Cardiac: Regular rate and rhythm, no S3, no pericardial rub.  Abdomen: Soft, nontender, bowel sounds present, no guarding or rebound.  Extremities: No pitting edema, distal pulses 2+.  ECG: I personally reviewed the tracing from 02/11/2016 which showed sinus rhythm with prolonged PR interval.  Recent Labwork: 05/08/2016: ALT 33; AST 18; BUN 25; Creatinine, Ser 1.55; Hemoglobin 12.5; Platelets 226; Potassium 4.3; Sodium 138     Component Value Date/Time   CHOL 144 03/19/2013 0428   TRIG 110 03/19/2013 0428   HDL 49 03/19/2013 0428   CHOLHDL 2.9 03/19/2013 0428   VLDL 22 03/19/2013 0428   LDLCALC 73 03/19/2013 0428    Other Studies Reviewed Today:  Echocardiogram 09/06/2016: Study Conclusions  - Left ventricle: The cavity size was normal. Wall thickness was   increased in a pattern of moderate LVH. Systolic function was   normal. The estimated ejection fraction was in the range of 55%   to 60%. Wall motion was normal; there were no regional wall   motion abnormalities. Doppler parameters are consistent with   abnormal left ventricular relaxation (grade 1 diastolic   dysfunction). - Aortic valve: Valve area (VTI): 1.88 cm^2. Valve area (Vmax):   1.76 cm^2. Valve area (Vmean): 1.61 cm^2. - Atrial septum: No defect or patent foramen ovale was identified. - Technically adequate study.  Assessment and Plan:  1. Recent reported weight gain, patient was concerned that he may have developed a recurrent cardiomyopathy,  however echocardiogram shows normal LVEF and only mild diastolic dysfunction. He did improve with increase in Lasix temporarily suggesting a component of volume overload, although had other symptoms that sound more like URI or allergies concurrently. No change in present medical regimen. If he develops a weight gain of 5 pounds in a week or 3 pounds in 24 hours, he can double Lasix temporarily.  2. Essential hypertension, blood pressure is well controlled today.  Current medicines were reviewed with the patient today.  Disposition: Follow-up in 6 months.  Signed, Satira Sark, MD, [facility] 09/07/2016 1:14 PM    [place] [facility] [address], [place], [place] [address] Phone: [phone]; Fax: [phone]

## 2016-09-07 ENCOUNTER — Ambulatory Visit (INDEPENDENT_AMBULATORY_CARE_PROVIDER_SITE_OTHER): Payer: Medicare HMO | Admitting: Cardiology

## 2016-09-07 ENCOUNTER — Encounter: Payer: Self-pay | Admitting: Cardiology

## 2016-09-07 VITALS — BP 114/72 | HR 84 | Ht 68.0 in | Wt 191.0 lb

## 2016-09-07 DIAGNOSIS — I10 Essential (primary) hypertension: Secondary | ICD-10-CM

## 2016-09-07 DIAGNOSIS — I428 Other cardiomyopathies: Secondary | ICD-10-CM

## 2016-09-07 NOTE — Patient Instructions (Signed)
Medication Instructions:  Your physician recommends that you continue on your current medications as directed. Please refer to the Current Medication list given to you today.  Labwork: NONE  Testing/Procedures: NONE  Follow-Up: Your physician wants you to follow-up in: October with Dr. Domenic Polite. You will receive a reminder letter in the mail two months in advance. If you don't receive a letter, please call our office to schedule the follow-up appointment.  Any Other Special Instructions Will Be Listed Below (If Applicable).  If you need a refill on your cardiac medications before your next appointment, please call your pharmacy.

## 2016-11-03 ENCOUNTER — Other Ambulatory Visit (HOSPITAL_COMMUNITY): Payer: Self-pay | Admitting: *Deleted

## 2016-11-03 DIAGNOSIS — D472 Monoclonal gammopathy: Secondary | ICD-10-CM

## 2016-11-03 DIAGNOSIS — C9000 Multiple myeloma not having achieved remission: Secondary | ICD-10-CM

## 2016-11-05 NOTE — Progress Notes (Signed)
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  Nephrologist is Corey Murphy.  HPI Elements   Location: Blood  Quality: IgG lambda   Severity: Smoldering  Duration: Dx in March 2017  Context: Chronic renal disease  Timing: Bone marrow aspiration and biopsy on 07/08/2015  Modifying Factors:   Associated Signs & Symptoms:     He was not able to make it to the [employer] this year.  He denies any new pain.  He denies any urinary complaints.  He denies any B symptoms.  He was last seen by nephrology on 05/30/2016.  Corey Murphy note is reviewed.  He is to continue with his current anti-hypertensive regimen as prescribed.  He is to continue with his diabetic regimen as well.  He is advised to refrain from NSAID use.  He may take OTC Acetaminophen for analgesia.  He will return to his nephrologist for follow-up in Jan 2019.  Review of Systems  Constitutional: Negative.  Negative for chills, fever and weight loss.  HENT: Negative.   Eyes: Negative.   Respiratory: Negative.  Negative for cough.   Cardiovascular: Negative.  Negative for chest pain.  Gastrointestinal: Negative.  Negative for blood in stool, constipation,  diarrhea, melena, nausea and vomiting.  Genitourinary: Negative.   Musculoskeletal: Negative.   Skin: Negative.   Neurological: Negative.  Negative for weakness.  Endo/Heme/Allergies: Negative.   Psychiatric/Behavioral: Negative.     Past Medical History:  Diagnosis Date  . Alcohol dependency ([place])   . Colonic adenoma 2006  . Essential hypertension, benign   . History of cardiac catheterization    03/2013 LM nl, LAD nl, D1/2/3 small - nl, LCX nondom - nl, OM1/2/3 nl, RCA dom  . Hypercholesteremia   . Hypertensive heart disease   . 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 Nonischemic cardiomyopathy (HCC)    LVEF approximately 40% 06/2013 - improved to normal range  . Obesity   . Type 2 diabetes mellitus ([place])     Past Surgical History:  Procedure Laterality Date  . COLONOSCOPY  05/20/2004   RMR: Internal hemorrhoids.  Diminutive adenomatous polyp in the mid-right colon, otherwise normal  . COLONOSCOPY N/A 06/19/2012   Procedure: COLONOSCOPY;  Surgeon: Corey Dolin, MD;  [facility];  Service: Endoscopy;  Laterality: N/A;  8:30AM-rescheduled 10:30am Corey Murphy notified pt  . Ileocolonoscopy  06/28/2007   RCV:[id number] rectum/Submucosal sigmoid diverticula (chronic), doubt clinical significance Hyperplastic polypoid-appearing fold, mid ascending colon, status/Remainder colonic mucosa and terminal ileum mucosa appeared normal   . LEFT AND RIGHT HEART CATHETERIZATION WITH CORONARY ANGIOGRAM  03/19/2013   Procedure: LEFT AND RIGHT HEART CATHETERIZATION WITH CORONARY ANGIOGRAM;  Surgeon: Corey Hampshire, MD;  Location: [place] [facility]ATH [facility];  Service: Cardiovascular;;  . TOTAL HIP ARTHROPLASTY  2010   Left  . TOTAL HIP ARTHROPLASTY  2010   Right    Family History  Problem Relation Age of Onset  . Heart failure Mother   . Diabetes Mother   . Hypertension Mother   . Heart failure Sister   . Hypertension Sister   . Cancer Sister   . Colon cancer Neg Hx     Social History    Social History  . Marital status: Married    Spouse name: N/A  . Number of children: 2  . Years of education: N/A   Occupational History  . Retired, Psychologist, educational    Social History Main Topics  . Smoking status: Former Smoker    Packs/day: 1.00    Years: 12.00    Types: Cigarettes    Quit date: 05/28/2008  . Smokeless tobacco: Never Used  . Alcohol use No     Comment: Quit in 2008.  Used to drink 12-14 beers on weekends only  . Drug use: Yes    Types: Marijuana     Comment: Smoked via a bowl occassionally.  . Sexual activity: Not on file   Other Topics Concern  . Not on file   Social History Narrative  . No narrative on file     PHYSICAL EXAMINATION  ECOG PERFORMANCE STATUS: 0 - Asymptomatic  Vitals:   11/06/16 1317  BP: 118/76  Pulse: 74  Resp: 14  Temp: 98.2 F (36.8 C)    GENERAL:alert, healthy, no distress, well nourished, well developed, comfortable, cooperative, smiling and accompanied by his wife. SKIN: skin color, texture, turgor are normal, no rashes or significant lesions HEAD: Normocephalic, No masses, lesions, tenderness or abnormalities EYES: normal, EOMI EARS: External ears normal OROPHARYNX:lips, buccal mucosa, and tongue normal and mucous membranes are moist  NECK: supple, trachea midline LYMPH:  not examined BREAST:not examined LUNGS: clear to auscultation  HEART: regular rate & rhythm ABDOMEN:abdomen soft, non-tender and normal bowel sounds BACK: Back symmetric, no curvature. EXTREMITIES:less then 2 second capillary refill, no joint deformities, effusion, or inflammation, no skin discoloration, no clubbing, no cyanosis  NEURO: alert & oriented x 3 with fluent speech, no focal motor/sensory deficits, gait normal   LABORATORY DATA: CBC    Component Value Date/Time   WBC 5.2 11/06/2016 1251   RBC 4.39 11/06/2016 1251   HGB 13.4 11/06/2016 1251   HCT 40.1 11/06/2016 1251   PLT 254 11/06/2016 1251   MCV 91.3 11/06/2016 1251   MCH  30.5 11/06/2016 1251   MCHC 33.4 11/06/2016 1251   RDW 14.2 11/06/2016 1251   LYMPHSABS 2.0 11/06/2016 1251   MONOABS 0.4 11/06/2016 1251   EOSABS 0.1 11/06/2016 1251   BASOSABS 0.0 11/06/2016 1251      Chemistry      Component Value Date/Time   NA 138 05/08/2016 1243   K 4.3 05/08/2016 1243   CL 103 05/08/2016 1243   CO2 29 05/08/2016 1243   BUN 25 (H) 05/08/2016 1243   CREATININE 1.55 (H) 05/08/2016 1243   CREATININE 1.49 (H) 07/15/2014 1423      Component Value Date/Time   CALCIUM 9.2 05/08/2016 1243   ALKPHOS 70 05/08/2016 1243   AST 18 05/08/2016 1243   ALT 33 05/08/2016 1243   BILITOT 0.5 05/08/2016 1243     Lab Results  Component Value Date   PROT 7.6 05/08/2016   ALBUMINELP 3.7 05/08/2016   A1GS 0.2 05/08/2016   A2GS 0.6 05/08/2016   BETS 1.1 05/08/2016   GAMS 1.3 05/08/2016   MSPIKE 0.8 (H) 05/08/2016   SPEI Comment 05/08/2016   SPECOM Comment 05/08/2016   IGGSERUM  1,611 (H) 05/08/2016   IGA 81 05/08/2016   IGMSERUM 32 05/08/2016   KPAFRELGTCHN 24.7 (H) 05/08/2016   LAMBDASER 38.8 (H) 05/08/2016   KAPLAMBRATIO 4.93 05/16/2016     PENDING LABS:   RADIOGRAPHIC STUDIES:  No results found.   PATHOLOGY:    ASSESSMENT AND PLAN:  IgG lambda monoclonal gammopathy IgG monoclonal protein with lambda light chain specificity with a negative bone survey, and bone marrow aspiration and biopsy on 07/08/2015 showing 10-20% plasma cells (lambda restricted)- 14% by aspirate count in the setting of stage III chronic renal disease, normal calcium, and trace anemia.  Nephrologist is Corey Murphy.  Labs today: CBC diff, CMET, SPEP+IFE, light chain assay, IgG, IgM, IgA.  I personally reviewed and went over laboratory results with the patient.  The results are noted within this dictation.  Will send patient home with 24 hour urine collection for UPEP+IFE.  Orders are placed.  Labs in 6 months: CBC diff, CMET, SPEP+IFE, light chain assay, UPEP+IFE.  Bone survey  in 6 months for annual imaging surveillance is ordered.  The diagnostic approach to a patient with kidney disease and a monoclonal plasma or B cell disorder or monoclonal protein depends upon the clinical presentation of the patient. The overall goal of the evaluation is to determine whether a monoclonal protein is involved in the pathogenesis of the kidney disease. In most cases, a kidney biopsy is required to establish this association and to guide therapy.I have discussed this with him. Kidney function will be monitored closely, if any significant changes occur we can discuss biopsy with his nephrologist.   The International Myeloma Working Group criteria for the diagnosis of MM emphasize the importance of end organ damage in making the diagnosis.  The diagnosis of MM requires the fulfillment of the following criterion: ?Clonal bone marrow plasma cells ?10 percent or biopsy-proven bony or soft tissue plasmacytoma - Clonality should be established by showing a kappa/lambda light-chain restriction on flow cytometry, immunohistochemistry, or immunofluorescence. Bone marrow plasma cell percentage should be estimated from a core biopsy specimen, when possible. If there is disparity between the aspirate and core biopsy, the highest value should be used. Approximately 4 percent of patients may have fewer than 10 percent bone marrow plasma cells since marrow involvement may be focal, rather than diffuse. Repeat bone marrow biopsy should be considered in such patients. PLUS one of the following: ?Presence of related organ or tissue impairment (often recalled by the acronym CRAB) - End organ damage is suggested by increased plasma calcium level, renal insufficiency, anemia, and bone lesions. In order to be included as diagnostic criteria, changes in these factors must be felt to be related to the underlying plasma cell proliferative disorder. For these purposes, the following definitions are used:  .Anemia -  Hemoglobin <10 g/dL (<100 g/L) or >2 g/dL (>20 g/L) below normal  .Hypercalcemia - Serum calcium >11 mg/dL (>2.75 mmol/liter). Consider other causes of hypercalcemia (eg, hyperparathyroidism).   .Renal insufficiency - Estimated or measured creatine clearance <40 mL/min or serum creatinine >2 mg/dL (177 mol/liter). Of these, creatinine clearance is the preferred measure of renal insufficiency because normal serum creatinine levels vary by age, sex, and race. Using creatinine clearance ensures that a similar level of renal dysfunction is required to end organ damage.  .Bone lesions - One or more osteolytic lesions ?5 mm in size on skeletal radiography, MRI, CT, or PET/CT. In the absence of osteolytic lesions, the following are not sufficient markers of bone lesions: increased  FDG uptake on PET, osteoporosis, or vertebral compression fracture. When a diagnosis is in doubt, biopsy of the bone lesion should be considered.  Return in 6 months for follow-up.   ORDERS PLACED FOR THIS ENCOUNTER: Orders Placed This Encounter  Procedures  . DG Bone Survey Met  . IgG, IgA, IgM  . Kappa/lambda light chains  . CBC with Differential  . Comprehensive metabolic panel  . Kappa/lambda light chains  . IgG, IgA, IgM  . Immunofixation electrophoresis  . Protein electrophoresis, serum  . Protein electrophoresis, urine  . Immunofixation, urine  . Protein electrophoresis, urine  . Immunofixation, urine    MEDICATIONS PRESCRIBED THIS ENCOUNTER: No orders of the defined types were placed in this encounter.   THERAPY PLAN:  Continue with ongoing surveillance every 6 months.  All questions were answered. The patient knows to call the clinic with any problems, questions or concerns. We can certainly see the patient much sooner if necessary.  Patient and plan discussed with Dr. Twana First and she is in agreement with the aforementioned.   This note is electronically signed by: Robynn Pane,  PA-C 11/06/2016 1:26 PM

## 2016-11-05 NOTE — Assessment & Plan Note (Addendum)
IgG monoclonal protein with lambda light chain specificity with a negative bone survey, and bone marrow aspiration and biopsy on 07/08/2015 showing 10-20% plasma cells (lambda restricted)- 14% by aspirate count in the setting of stage III chronic renal disease, normal calcium, and trace anemia.  Nephrologist is Dr. Pearson Grippe.  Labs today: CBC diff, CMET, SPEP+IFE, light chain assay, IgG, IgM, IgA.  I personally reviewed and went over laboratory results with the patient.  The results are noted within this dictation.  Labs in 6 months: CBC diff, CMET, SPEP+IFE, light chain assay.  Bone survey in 6 months for annual imaging surveillance is ordered.  The diagnostic approach to a patient with kidney disease and a monoclonal plasma or B cell disorder or monoclonal protein depends upon the clinical presentation of the patient. The overall goal of the evaluation is to determine whether a monoclonal protein is involved in the pathogenesis of the kidney disease. In most cases, a kidney biopsy is required to establish this association and to guide therapy.I have discussed this with him. Kidney function will be monitored closely, if any significant changes occur we can discuss biopsy with his nephrologist.   The International Myeloma Working Group criteria for the diagnosis of MM emphasize the importance of end organ damage in making the diagnosis.  The diagnosis of MM requires the fulfillment of the following criterion: ?Clonal bone marrow plasma cells ?10 percent or biopsy-proven bony or soft tissue plasmacytoma - Clonality should be established by showing a kappa/lambda light-chain restriction on flow cytometry, immunohistochemistry, or immunofluorescence. Bone marrow plasma cell percentage should be estimated from a core biopsy specimen, when possible. If there is disparity between the aspirate and core biopsy, the highest value should be used. Approximately 4 percent of patients may have fewer than 10 percent  bone marrow plasma cells since marrow involvement may be focal, rather than diffuse. Repeat bone marrow biopsy should be considered in such patients. PLUS one of the following: ?Presence of related organ or tissue impairment (often recalled by the acronym CRAB) - End organ damage is suggested by increased plasma calcium level, renal insufficiency, anemia, and bone lesions. In order to be included as diagnostic criteria, changes in these factors must be felt to be related to the underlying plasma cell proliferative disorder. For these purposes, the following definitions are used:  .Anemia - Hemoglobin <10 g/dL (<100 g/L) or >2 g/dL (>20 g/L) below normal  .Hypercalcemia - Serum calcium >11 mg/dL (>2.75 mmol/liter). Consider other causes of hypercalcemia (eg, hyperparathyroidism).   .Renal insufficiency - Estimated or measured creatine clearance <40 mL/min or serum creatinine >2 mg/dL (177 mol/liter). Of these, creatinine clearance is the preferred measure of renal insufficiency because normal serum creatinine levels vary by age, sex, and race. Using creatinine clearance ensures that a similar level of renal dysfunction is required to end organ damage.  .Bone lesions - One or more osteolytic lesions ?5 mm in size on skeletal radiography, MRI, CT, or PET/CT. In the absence of osteolytic lesions, the following are not sufficient markers of bone lesions: increased FDG uptake on PET, osteoporosis, or vertebral compression fracture. When a diagnosis is in doubt, biopsy of the bone lesion should be considered.  Return in 6 months for follow-up.

## 2016-11-06 ENCOUNTER — Encounter (HOSPITAL_BASED_OUTPATIENT_CLINIC_OR_DEPARTMENT_OTHER): Payer: Medicare HMO | Admitting: Oncology

## 2016-11-06 ENCOUNTER — Encounter (HOSPITAL_COMMUNITY): Payer: Medicare HMO | Attending: Oncology

## 2016-11-06 ENCOUNTER — Ambulatory Visit (HOSPITAL_COMMUNITY): Payer: Medicare HMO | Admitting: Oncology

## 2016-11-06 VITALS — BP 118/76 | HR 74 | Temp 98.2°F | Resp 14 | Wt 204.3 lb

## 2016-11-06 DIAGNOSIS — I429 Cardiomyopathy, unspecified: Secondary | ICD-10-CM | POA: Diagnosis not present

## 2016-11-06 DIAGNOSIS — E1122 Type 2 diabetes mellitus with diabetic chronic kidney disease: Secondary | ICD-10-CM | POA: Diagnosis not present

## 2016-11-06 DIAGNOSIS — E669 Obesity, unspecified: Secondary | ICD-10-CM | POA: Diagnosis not present

## 2016-11-06 DIAGNOSIS — Z96643 Presence of artificial hip joint, bilateral: Secondary | ICD-10-CM | POA: Insufficient documentation

## 2016-11-06 DIAGNOSIS — N183 Chronic kidney disease, stage 3 (moderate): Secondary | ICD-10-CM | POA: Insufficient documentation

## 2016-11-06 DIAGNOSIS — Z800 Family history of malignant neoplasm of digestive organs: Secondary | ICD-10-CM | POA: Insufficient documentation

## 2016-11-06 DIAGNOSIS — N189 Chronic kidney disease, unspecified: Secondary | ICD-10-CM | POA: Diagnosis not present

## 2016-11-06 DIAGNOSIS — D649 Anemia, unspecified: Secondary | ICD-10-CM | POA: Diagnosis not present

## 2016-11-06 DIAGNOSIS — I1310 Hypertensive heart and chronic kidney disease without heart failure, with stage 1 through stage 4 chronic kidney disease, or unspecified chronic kidney disease: Secondary | ICD-10-CM | POA: Diagnosis not present

## 2016-11-06 DIAGNOSIS — E7800 Pure hypercholesterolemia, unspecified: Secondary | ICD-10-CM | POA: Insufficient documentation

## 2016-11-06 DIAGNOSIS — C9000 Multiple myeloma not having achieved remission: Secondary | ICD-10-CM | POA: Diagnosis not present

## 2016-11-06 DIAGNOSIS — Z87891 Personal history of nicotine dependence: Secondary | ICD-10-CM | POA: Insufficient documentation

## 2016-11-06 DIAGNOSIS — D472 Monoclonal gammopathy: Secondary | ICD-10-CM | POA: Diagnosis not present

## 2016-11-06 DIAGNOSIS — Z833 Family history of diabetes mellitus: Secondary | ICD-10-CM | POA: Insufficient documentation

## 2016-11-06 DIAGNOSIS — Z8249 Family history of ischemic heart disease and other diseases of the circulatory system: Secondary | ICD-10-CM | POA: Diagnosis not present

## 2016-11-06 DIAGNOSIS — Z9889 Other specified postprocedural states: Secondary | ICD-10-CM | POA: Insufficient documentation

## 2016-11-06 LAB — CBC WITH DIFFERENTIAL/PLATELET
BASOS ABS: 0 10*3/uL (ref 0.0–0.1)
Basophils Relative: 0 %
EOS ABS: 0.1 10*3/uL (ref 0.0–0.7)
EOS PCT: 2 %
HCT: 40.1 % (ref 39.0–52.0)
Hemoglobin: 13.4 g/dL (ref 13.0–17.0)
LYMPHS PCT: 38 %
Lymphs Abs: 2 10*3/uL (ref 0.7–4.0)
MCH: 30.5 pg (ref 26.0–34.0)
MCHC: 33.4 g/dL (ref 30.0–36.0)
MCV: 91.3 fL (ref 78.0–100.0)
MONO ABS: 0.4 10*3/uL (ref 0.1–1.0)
Monocytes Relative: 8 %
Neutro Abs: 2.7 10*3/uL (ref 1.7–7.7)
Neutrophils Relative %: 52 %
PLATELETS: 254 10*3/uL (ref 150–400)
RBC: 4.39 MIL/uL (ref 4.22–5.81)
RDW: 14.2 % (ref 11.5–15.5)
WBC: 5.2 10*3/uL (ref 4.0–10.5)

## 2016-11-06 LAB — COMPREHENSIVE METABOLIC PANEL
ALT: 40 U/L (ref 17–63)
AST: 25 U/L (ref 15–41)
Albumin: 3.9 g/dL (ref 3.5–5.0)
Alkaline Phosphatase: 78 U/L (ref 38–126)
Anion gap: 7 (ref 5–15)
BUN: 24 mg/dL — ABNORMAL HIGH (ref 6–20)
CHLORIDE: 104 mmol/L (ref 101–111)
CO2: 26 mmol/L (ref 22–32)
Calcium: 9.3 mg/dL (ref 8.9–10.3)
Creatinine, Ser: 1.67 mg/dL — ABNORMAL HIGH (ref 0.61–1.24)
GFR, EST AFRICAN AMERICAN: 48 mL/min — AB (ref >60)
GFR, EST NON AFRICAN AMERICAN: 42 mL/min — AB (ref >60)
Glucose, Bld: 120 mg/dL — ABNORMAL HIGH (ref 65–99)
POTASSIUM: 4.7 mmol/L (ref 3.5–5.1)
SODIUM: 137 mmol/L (ref 135–145)
Total Bilirubin: 0.7 mg/dL (ref 0.3–1.2)
Total Protein: 8.2 g/dL — ABNORMAL HIGH (ref 6.5–8.1)

## 2016-11-06 NOTE — Patient Instructions (Signed)
[place] at [facility] Discharge Instructions  RECOMMENDATIONS MADE BY THE CONSULTANT AND ANY TEST RESULTS WILL BE SENT TO YOUR REFERRING PHYSICIAN.  LABS IN 6 MONTHS. BONE SURVEY IN 6 MONTHS. RETURN IN 6 MONTHS FOR FOLLOW UP.   Thank you for choosing [place] at [facility] to provide your oncology and hematology care.  To afford each patient quality time with our provider, please arrive at least 15 minutes before your scheduled appointment time.    If you have a lab appointment with the [facility] please come in thru the  Main Entrance and check in at the main information desk  You need to re-schedule your appointment should you arrive 10 or more minutes late.  We strive to give you quality time with our providers, and arriving late affects you and other patients whose appointments are after yours.  Also, if you no show three or more times for appointments you may be dismissed from the clinic at the providers discretion.     Again, thank you for choosing [facility].  Our hope is that these requests will decrease the amount of time that you wait before being seen by our physicians.       _____________________________________________________________  Should you have questions after your visit to [facility], please contact our office at [phone] between the hours of 8:30 a.m. and 4:30 p.m.  Voicemails left after 4:30 p.m. will not be returned until the following business day.  For prescription refill requests, have your pharmacy contact our office.       Resources For Cancer Patients and their Caregivers ? American Cancer Society: Can assist with transportation, wigs, general needs, runs Look Good Feel Better.        867-046-1551 ? Cancer Care: Provides financial assistance, online support groups, medication/co-pay assistance.  1-800-813-HOPE (630)063-5218) ? [place] Assists  [place] cancer patients and their families through emotional , educational and financial support.  3026309885 ? [place] DSS Where to apply for food stamps, Medicaid and utility assistance. 262-661-7012 ? RCATS: Transportation to medical appointments. 818-386-3716 ? Social Security Administration: May apply for disability if have a Stage IV cancer. 828-108-8742 (805)427-4399 ? [employer], Disability and Transit Services: Assists with nutrition, care and transit needs. [place] Support Programs: @10RELATIVEDAYS @ > Cancer Support Group  2nd Tuesday of the month 1pm-2pm, Journey Room  > Creative Journey  3rd Tuesday of the month 1130am-1pm, Journey Room  > Look Good Feel Better  1st Wednesday of the month 10am-12 noon, Journey Room (Call [place] to register [phone])

## 2016-11-07 LAB — KAPPA/LAMBDA LIGHT CHAINS
Kappa free light chain: 21.5 mg/L — ABNORMAL HIGH (ref 3.3–19.4)
Kappa, lambda light chain ratio: 0.47 (ref 0.26–1.65)
Lambda free light chains: 45.4 mg/L — ABNORMAL HIGH (ref 5.7–26.3)

## 2016-11-07 LAB — PROTEIN ELECTROPHORESIS, SERUM
A/G Ratio: 1 (ref 0.7–1.7)
Albumin ELP: 3.6 g/dL (ref 2.9–4.4)
Alpha-1-Globulin: 0.2 g/dL (ref 0.0–0.4)
Alpha-2-Globulin: 0.7 g/dL (ref 0.4–1.0)
Beta Globulin: 1.2 g/dL (ref 0.7–1.3)
GAMMA GLOBULIN: 1.5 g/dL (ref 0.4–1.8)
GLOBULIN, TOTAL: 3.7 g/dL (ref 2.2–3.9)
M-Spike, %: 1.2 g/dL — ABNORMAL HIGH
TOTAL PROTEIN ELP: 7.3 g/dL (ref 6.0–8.5)

## 2016-11-07 LAB — IGG, IGA, IGM
IGA: 83 mg/dL (ref 61–437)
IGG (IMMUNOGLOBIN G), SERUM: 1786 mg/dL — AB (ref 700–1600)
IgM, Serum: 34 mg/dL (ref 20–172)

## 2016-11-08 ENCOUNTER — Other Ambulatory Visit: Payer: Self-pay | Admitting: Cardiology

## 2016-11-08 LAB — IMMUNOFIXATION ELECTROPHORESIS
IgA: 90 mg/dL (ref 61–437)
IgG (Immunoglobin G), Serum: 1810 mg/dL — ABNORMAL HIGH (ref 700–1600)
IgM, Serum: 38 mg/dL (ref 20–172)
TOTAL PROTEIN ELP: 7.7 g/dL (ref 6.0–8.5)

## 2016-11-13 DIAGNOSIS — Z6831 Body mass index (BMI) 31.0-31.9, adult: Secondary | ICD-10-CM | POA: Diagnosis not present

## 2016-11-13 DIAGNOSIS — Z1389 Encounter for screening for other disorder: Secondary | ICD-10-CM | POA: Diagnosis not present

## 2016-11-13 DIAGNOSIS — E6609 Other obesity due to excess calories: Secondary | ICD-10-CM | POA: Diagnosis not present

## 2016-11-13 DIAGNOSIS — I10 Essential (primary) hypertension: Secondary | ICD-10-CM | POA: Diagnosis not present

## 2016-11-13 DIAGNOSIS — B351 Tinea unguium: Secondary | ICD-10-CM | POA: Diagnosis not present

## 2016-11-13 DIAGNOSIS — R201 Hypoesthesia of skin: Secondary | ICD-10-CM | POA: Diagnosis not present

## 2016-11-13 DIAGNOSIS — L84 Corns and callosities: Secondary | ICD-10-CM | POA: Diagnosis not present

## 2016-11-13 DIAGNOSIS — E1142 Type 2 diabetes mellitus with diabetic polyneuropathy: Secondary | ICD-10-CM | POA: Diagnosis not present

## 2016-11-17 ENCOUNTER — Other Ambulatory Visit: Payer: Self-pay | Admitting: Cardiology

## 2016-11-17 DIAGNOSIS — Z1389 Encounter for screening for other disorder: Secondary | ICD-10-CM | POA: Diagnosis not present

## 2016-11-17 DIAGNOSIS — E6609 Other obesity due to excess calories: Secondary | ICD-10-CM | POA: Diagnosis not present

## 2016-11-17 DIAGNOSIS — Z6831 Body mass index (BMI) 31.0-31.9, adult: Secondary | ICD-10-CM | POA: Diagnosis not present

## 2016-11-29 DIAGNOSIS — E6609 Other obesity due to excess calories: Secondary | ICD-10-CM | POA: Diagnosis not present

## 2016-11-29 DIAGNOSIS — Z1389 Encounter for screening for other disorder: Secondary | ICD-10-CM | POA: Diagnosis not present

## 2016-11-29 DIAGNOSIS — I5032 Chronic diastolic (congestive) heart failure: Secondary | ICD-10-CM | POA: Diagnosis not present

## 2016-11-29 DIAGNOSIS — R55 Syncope and collapse: Secondary | ICD-10-CM | POA: Diagnosis not present

## 2016-11-29 DIAGNOSIS — C9000 Multiple myeloma not having achieved remission: Secondary | ICD-10-CM | POA: Diagnosis not present

## 2016-11-29 DIAGNOSIS — E1140 Type 2 diabetes mellitus with diabetic neuropathy, unspecified: Secondary | ICD-10-CM | POA: Diagnosis not present

## 2016-11-29 DIAGNOSIS — E1129 Type 2 diabetes mellitus with other diabetic kidney complication: Secondary | ICD-10-CM | POA: Diagnosis not present

## 2016-11-29 DIAGNOSIS — Z6830 Body mass index (BMI) 30.0-30.9, adult: Secondary | ICD-10-CM | POA: Diagnosis not present

## 2016-11-29 DIAGNOSIS — I10 Essential (primary) hypertension: Secondary | ICD-10-CM | POA: Diagnosis not present

## 2016-11-30 ENCOUNTER — Ambulatory Visit (INDEPENDENT_AMBULATORY_CARE_PROVIDER_SITE_OTHER): Payer: Medicare HMO | Admitting: Cardiology

## 2016-11-30 ENCOUNTER — Encounter: Payer: Self-pay | Admitting: Cardiology

## 2016-11-30 VITALS — BP 120/80 | HR 88 | Ht 68.0 in | Wt 197.0 lb

## 2016-11-30 DIAGNOSIS — I428 Other cardiomyopathies: Secondary | ICD-10-CM

## 2016-11-30 DIAGNOSIS — E119 Type 2 diabetes mellitus without complications: Secondary | ICD-10-CM

## 2016-11-30 DIAGNOSIS — R42 Dizziness and giddiness: Secondary | ICD-10-CM

## 2016-11-30 DIAGNOSIS — I10 Essential (primary) hypertension: Secondary | ICD-10-CM

## 2016-11-30 NOTE — Progress Notes (Signed)
Cardiology Office Note  Date: 11/30/2016   ID: Corey Murphy, DOB Jul 16, 1950, MRN [id number]  PCP: Redmond School, MD  Primary Cardiologist: Rozann Lesches, MD   Chief Complaint  Patient presents with  . Coronary Artery Disease  . Recent episode of dizziness    History of Present Illness: Corey Murphy is a 66 y.o. male last seen in May. He now presents to the office with his wife today after recent visit with Dr. Gerarda Fraction. He states that he experienced an episode of lightheadedness and tingling in his legs while he was exercising at the gym. 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 Then he got on the treadmill for about 10 minutes when the symptoms started. He stopped and rested, then worked out with weights, after about 15 minutes the symptoms went away. He states his blood sugar was 97 that morning. He typically does not hydrate before he exercises. At that time he had no chest pain or palpitations and did not have frank syncope. Otherwise he has been feeling well.  I reviewed his medications which are outlined below in stable from a cardiac perspective. States that his weights have been relatively stable.  I personally reviewed his ECG today which shows sinus rhythm with prolonged PR interval and poor R-wave progression.  Past Medical History:  Diagnosis Date  . Alcohol dependency ([place])   . Colonic adenoma 2006  . Essential hypertension, benign   . History of cardiac catheterization    03/2013 LM nl, LAD nl, D1/2/3 small - nl, LCX nondom - nl, OM1/2/3 nl, RCA dom  . Hypercholesteremia   . Hypertensive heart disease   . 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 Nonischemic cardiomyopathy (HCC)    LVEF approximately 40% 06/2013 - improved to normal range  . Obesity   . Type 2 diabetes mellitus ([place])     Past Surgical History:  Procedure Laterality Date  . COLONOSCOPY  05/20/2004   RMR: Internal hemorrhoids.  Diminutive adenomatous  polyp in the mid-right colon, otherwise normal  . COLONOSCOPY N/A 06/19/2012   Procedure: COLONOSCOPY;  Surgeon: Daneil Dolin, MD;  [facility];  Service: Endoscopy;  Laterality: N/A;  8:30AM-rescheduled 10:30am Darius Bump notified pt  . Ileocolonoscopy  06/28/2007   [id number] rectum/Submucosal sigmoid diverticula (chronic), doubt clinical significance Hyperplastic polypoid-appearing fold, mid ascending colon, status/Remainder colonic mucosa and terminal ileum mucosa appeared normal   . LEFT AND RIGHT HEART CATHETERIZATION WITH CORONARY ANGIOGRAM  03/19/2013   Procedure: LEFT AND RIGHT HEART CATHETERIZATION WITH CORONARY ANGIOGRAM;  Surgeon: Wellington Hampshire, MD;  Location: [facility] CATH LAB;  Service: Cardiovascular;;  . TOTAL HIP ARTHROPLASTY  2010   Left  . TOTAL HIP ARTHROPLASTY  2010   Right    Current Outpatient Prescriptions  Medication Sig Dispense Refill  . aspirin 81 MG tablet Take 81 mg by mouth daily.    . carvedilol (COREG) 12.5 MG tablet TAKE 1 TABLET BY MOUTH TWICE DAILY WITH FOOD 180 tablet 1  . furosemide (LASIX) 20 MG tablet TAKE 1 TABLET BY MOUTH EVERY DAY 90 tablet 0  . glimepiride (AMARYL) 2 MG tablet Take 2 mg by mouth daily with breakfast. 2 tabs BID    . HYDROcodone-acetaminophen (NORCO) 10-325 MG tablet Take 1 tablet by mouth every 6 (six) hours as needed.    Marland Kitchen lisinopril (PRINIVIL,ZESTRIL) 2.5 MG tablet Take 2.5 mg by mouth daily.    . ONE TOUCH ULTRA TEST test  strip     . simvastatin (ZOCOR) 20 MG tablet TK 1 T PO D  3  . spironolactone (ALDACTONE) 25 MG tablet TAKE HALF A TABLET BY MOUTH DAILY 45 tablet 6   No current facility-administered medications for this visit.    Allergies:  Patient has no known allergies.   Social History: The patient  reports that he quit smoking about 8 years ago. His smoking use included Cigarettes. He has a 12.00 pack-year smoking history. He has never used smokeless tobacco. He reports that he uses drugs, including  Marijuana. He reports that he does not drink alcohol.   ROS:  Please see the history of present illness. Otherwise, complete review of systems is positive for none.  All other systems are reviewed and negative.   Physical Exam: VS:  BP 120/80   Pulse 88   Ht 5\' 8"  (1.727 m)   Wt 197 lb (89.4 kg)   SpO2 98%   BMI 29.95 kg/m , BMI Body mass index is 29.95 kg/m.  Wt Readings from Last 3 Encounters:  11/30/16 197 lb (89.4 kg)  11/06/16 204 lb 4.8 oz (92.7 kg)  09/07/16 191 lb (86.6 kg)    Patient comfortable at rest.  HEENT: Conjunctiva and lids normal, oropharynx clear.  Neck: Supple, no elevated JVP or carotid bruits, no thyromegaly.  Lungs: Clear to auscultation, nonlabored breathing at rest.  Cardiac: Regular rate and rhythm, no S3, no pericardial rub.  Abdomen: Soft, nontender, bowel sounds present, no guarding or rebound.  Extremities: No pitting edema, distal pulses 2+. Skin: Warm and dry. Musculoskeletal: No kyphosis Neuropsychiatric: Alert and oriented 3, affect appropriate.  ECG: I personally reviewed the tracing from 02/11/2016 which showed sinus rhythm with prolonged PR interval.  Recent Labwork: 11/06/2016: ALT 40; AST 25; BUN 24; Creatinine, Ser 1.67; Hemoglobin 13.4; Platelets 254; Potassium 4.7; Sodium 137     Component Value Date/Time   CHOL 144 03/19/2013 0428   TRIG 110 03/19/2013 0428   HDL 49 03/19/2013 0428   CHOLHDL 2.9 03/19/2013 0428   VLDL 22 03/19/2013 0428   LDLCALC 73 03/19/2013 0428    Other Studies Reviewed Today:  Echocardiogram 09/06/2016: Study Conclusions  - Left ventricle: The cavity size was normal. Wall thickness was   increased in a pattern of moderate LVH. Systolic function was   normal. The estimated ejection fraction was in the range of 55%   to 60%. Wall motion was normal; there were no regional wall   motion abnormalities. Doppler parameters are consistent with   abnormal left ventricular relaxation (grade 1 diastolic    dysfunction). - Aortic valve: Valve area (VTI): 1.88 cm^2. Valve area (Vmax):   1.76 cm^2. Valve area (Vmean): 1.61 cm^2. - Atrial septum: No defect or patent foramen ovale was identified. - Technically adequate study.  Assessment and Plan:  1. Recent episode of lightheadedness and leg tingling as outlined. He has a history of nonischemic cardiomyopathy with normalization of LVEF and has been on stable medical therapy. Question whether this could be potentially related to relative dehydration with exercise since he does not specifically hydrate before he works out and admits that he sweats a lot with activity. Alternatively, blood sugar could have also dropped somewhat since it started out 97 that morning before his workout. He was not frankly orthostatic today and we did not change his medications. I have asked him to hydrate before he exercises and keep an eye on symptoms. He otherwise has not had any chest pain,  palpitations, or sudden unexplained syncope. If symptoms worsen we might consider a cardiac monitor next.  2. Essential hypertension, blood pressure is normal today at baseline.  3. Nonischemic cardiomyopathy with recent LVEF normalized at 00-37% and mild diastolic dysfunction. He has been on stable medical therapy.  4. Type 2 diabetes mellitus, on Amaryl. He continues to follow with Dr. Gerarda Fraction.  Current medicines were reviewed with the patient today.   Orders Placed This Encounter  Procedures  . EKG 12-Lead    Disposition: Keep next scheduled visit.  Signed, Satira Sark, MD, [facility] 11/30/2016 4:21 PM    [place] at [facility] [address], [place], [place] [address] Phone: [phone]; Fax: [phone]

## 2016-11-30 NOTE — Patient Instructions (Addendum)
Your physician recommends that you schedule a follow-up appointment in: keep apt that is scheduled for October      Stay hydrated, watch your blood sugars    Your physician recommends that you continue on your current medications as directed. Please refer to the Current Medication list given to you today.     Thank you for choosing [facility] !

## 2017-01-10 DIAGNOSIS — I10 Essential (primary) hypertension: Secondary | ICD-10-CM | POA: Diagnosis not present

## 2017-01-10 DIAGNOSIS — G894 Chronic pain syndrome: Secondary | ICD-10-CM | POA: Diagnosis not present

## 2017-01-10 DIAGNOSIS — Z0000 Encounter for general adult medical examination without abnormal findings: Secondary | ICD-10-CM | POA: Diagnosis not present

## 2017-01-10 DIAGNOSIS — I509 Heart failure, unspecified: Secondary | ICD-10-CM | POA: Diagnosis not present

## 2017-01-10 DIAGNOSIS — E782 Mixed hyperlipidemia: Secondary | ICD-10-CM | POA: Diagnosis not present

## 2017-01-10 DIAGNOSIS — E1129 Type 2 diabetes mellitus with other diabetic kidney complication: Secondary | ICD-10-CM | POA: Diagnosis not present

## 2017-01-10 DIAGNOSIS — Z6831 Body mass index (BMI) 31.0-31.9, adult: Secondary | ICD-10-CM | POA: Diagnosis not present

## 2017-02-03 ENCOUNTER — Other Ambulatory Visit: Payer: Self-pay | Admitting: Cardiology

## 2017-02-09 ENCOUNTER — Encounter: Payer: Self-pay | Admitting: Cardiology

## 2017-02-09 ENCOUNTER — Ambulatory Visit (INDEPENDENT_AMBULATORY_CARE_PROVIDER_SITE_OTHER): Payer: Medicare HMO | Admitting: Cardiology

## 2017-02-09 VITALS — BP 106/76 | HR 72 | Ht 68.0 in | Wt 201.0 lb

## 2017-02-09 DIAGNOSIS — I428 Other cardiomyopathies: Secondary | ICD-10-CM

## 2017-02-09 DIAGNOSIS — I10 Essential (primary) hypertension: Secondary | ICD-10-CM | POA: Diagnosis not present

## 2017-02-09 NOTE — Progress Notes (Signed)
Cardiology Office Note  Date: 02/09/2017   ID: Dequincy Born, DOB 1950-10-23, MRN [id number]  PCP: Redmond School, MD  Primary Cardiologist: Rozann Lesches, MD   Chief Complaint  Patient presents with  . History of cardiomyopathy    History of Present Illness: Corey Murphy is a 66 y.o. male last seen in August. He is here today with his wife for a follow-up visit. He states that since last encounter he has done well, no episodes of dizziness, no palpitations, no anginal chest pain. He continues to exercise at the gym.  I reviewed his medications which are outlined below in stable from a cardiac perspective.  Last echocardiogram in May is reviewed below.  Past Medical History:  Diagnosis Date  . Alcohol dependency ([place])   . Colonic adenoma 2006  . Essential hypertension, benign   . History of cardiac catheterization    03/2013 LM nl, LAD nl, D1/2/3 small - nl, LCX nondom - nl, OM1/2/3 nl, RCA dom  . Hypercholesteremia   . Hypertensive heart disease   . 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 Nonischemic cardiomyopathy (HCC)    LVEF approximately 40% 06/2013 - improved to normal range  . Obesity   . Type 2 diabetes mellitus ([place])     Past Surgical History:  Procedure Laterality Date  . COLONOSCOPY  05/20/2004   RMR: Internal hemorrhoids.  Diminutive adenomatous polyp in the mid-right colon, otherwise normal  . COLONOSCOPY N/A 06/19/2012   Procedure: COLONOSCOPY;  Surgeon: Daneil Dolin, MD;  [facility];  Service: Endoscopy;  Laterality: N/A;  8:30AM-rescheduled 10:30am Darius Bump notified pt  . Ileocolonoscopy  06/28/2007   FWY:[id number] rectum/Submucosal sigmoid diverticula (chronic), doubt clinical significance Hyperplastic polypoid-appearing fold, mid ascending colon, status/Remainder colonic mucosa and terminal ileum mucosa appeared normal   . LEFT AND RIGHT HEART CATHETERIZATION WITH CORONARY ANGIOGRAM  03/19/2013   Procedure: LEFT AND RIGHT HEART  CATHETERIZATION WITH CORONARY ANGIOGRAM;  Surgeon: Wellington Hampshire, MD;  Location: [facility] CATH LAB;  Service: Cardiovascular;;  . TOTAL HIP ARTHROPLASTY  2010   Left  . TOTAL HIP ARTHROPLASTY  2010   Right    Current Outpatient Prescriptions  Medication Sig Dispense Refill  . aspirin 81 MG tablet Take 81 mg by mouth daily.    . carvedilol (COREG) 12.5 MG tablet TAKE 1 TABLET BY MOUTH TWICE DAILY WITH FOOD 180 tablet 1  . furosemide (LASIX) 20 MG tablet TAKE 1 TABLET BY MOUTH EVERY DAY 90 tablet 3  . glimepiride (AMARYL) 2 MG tablet Take 2 mg by mouth daily with breakfast. 2 tabs BID    . HYDROcodone-acetaminophen (NORCO) 10-325 MG tablet Take 1 tablet by mouth every 6 (six) hours as needed.    Marland Kitchen lisinopril (PRINIVIL,ZESTRIL) 2.5 MG tablet Take 2.5 mg by mouth daily.    . ONE TOUCH ULTRA TEST test strip     . simvastatin (ZOCOR) 20 MG tablet TK 1 T PO D  3  . spironolactone (ALDACTONE) 25 MG tablet TAKE HALF A TABLET BY MOUTH DAILY 45 tablet 6   No current facility-administered medications for this visit.    Allergies:  Patient has no known allergies.   Social History: The patient  reports that he quit smoking about 8 years ago. His smoking use included Cigarettes. He has a 12.00 pack-year smoking history. He has never used smokeless tobacco. He reports that he uses drugs, including Marijuana. He reports that he does not drink alcohol.   ROS:  Please  see the history of present illness. Otherwise, complete review of systems is positive for none.  All other systems are reviewed and negative.   Physical Exam: VS:  BP 106/76   Pulse 72   Ht 5\' 8"  (1.727 m)   Wt 201 lb (91.2 kg)   SpO2 98%   BMI 30.56 kg/m , BMI Body mass index is 30.56 kg/m.  Wt Readings from Last 3 Encounters:  02/09/17 201 lb (91.2 kg)  11/30/16 197 lb (89.4 kg)  11/06/16 204 lb 4.8 oz (92.7 kg)    General: Patient appears comfortable at rest. HEENT: Conjunctiva and lids normal, oropharynx clear. Neck: Supple,  no elevated JVP or carotid bruits, no thyromegaly. Lungs: Clear to auscultation, nonlabored breathing at rest. Cardiac: Regular rate and rhythm, no S3 or significant systolic murmur, no pericardial rub. Abdomen: Soft, nontender, bowel sounds present, no guarding or rebound. Extremities: No pitting edema, distal pulses 2+. Skin: Warm and dry. Musculoskeletal: No kyphosis. Neuropsychiatric: Alert and oriented x3, affect grossly appropriate.  ECG: I personally reviewed the tracing from 11/30/2016 which showed sinus rhythm with prolonged PR interval and decreased R wave progression.  Recent Labwork: 11/06/2016: ALT 40; AST 25; BUN 24; Creatinine, Ser 1.67; Hemoglobin 13.4; Platelets 254; Potassium 4.7; Sodium 137   Other Studies Reviewed Today:  Echocardiogram 09/06/2016: Study Conclusions  - Left ventricle: The cavity size was normal. Wall thickness was   increased in a pattern of moderate LVH. Systolic function was   normal. The estimated ejection fraction was in the range of 55%   to 60%. Wall motion was normal; there were no regional wall   motion abnormalities. Doppler parameters are consistent with   abnormal left ventricular relaxation (grade 1 diastolic   dysfunction). - Aortic valve: Valve area (VTI): 1.88 cm^2. Valve area (Vmax):   1.76 cm^2. Valve area (Vmean): 1.61 cm^2. - Atrial septum: No defect or patent foramen ovale was identified. - Technically adequate study.  Assessment and Plan:  1. History of nonischemic cardiomyopathy with normalization of LVEF, recently documented at 55-60% in May. He will continue with medical therapy and observation.  2. Prior episode of lightheadedness and leg tingling as detailed in the previous note. There have been no recurrences. He is paying more attention to adequate hydration with his exercise regimen.  3. Essential hypertension, blood pressure is well controlled.  Current medicines were reviewed with the patient today.  Disposition:  Follow-up in 6 months.  Signed, Satira Sark, MD, [facility] 02/09/2017 11:40 AM    [place] at [place]. [address] [place], [place] [address] Phone: [phone]; Fax: [phone]

## 2017-02-09 NOTE — Patient Instructions (Signed)
Your physician wants you to follow-up in: 6 months with Dr.McDowell You will receive a reminder letter in the mail two months in advance. If you don't receive a letter, please call our office to schedule the follow-up appointment.    Your physician recommends that you continue on your current medications as directed. Please refer to the Current Medication list given to you today.    If you need a refill on your cardiac medications before your next appointment, please call your pharmacy.      No testing or labs ordered today.       Thank you for choosing [facility] !

## 2017-02-22 DIAGNOSIS — E11319 Type 2 diabetes mellitus with unspecified diabetic retinopathy without macular edema: Secondary | ICD-10-CM | POA: Diagnosis not present

## 2017-02-22 DIAGNOSIS — H524 Presbyopia: Secondary | ICD-10-CM | POA: Diagnosis not present

## 2017-02-22 DIAGNOSIS — E1140 Type 2 diabetes mellitus with diabetic neuropathy, unspecified: Secondary | ICD-10-CM | POA: Diagnosis not present

## 2017-03-04 ENCOUNTER — Other Ambulatory Visit: Payer: Self-pay | Admitting: Cardiology

## 2017-03-15 DIAGNOSIS — N183 Chronic kidney disease, stage 3 (moderate): Secondary | ICD-10-CM | POA: Diagnosis not present

## 2017-03-15 DIAGNOSIS — C9000 Multiple myeloma not having achieved remission: Secondary | ICD-10-CM | POA: Diagnosis not present

## 2017-03-15 DIAGNOSIS — E119 Type 2 diabetes mellitus without complications: Secondary | ICD-10-CM | POA: Diagnosis not present

## 2017-03-15 DIAGNOSIS — Z6831 Body mass index (BMI) 31.0-31.9, adult: Secondary | ICD-10-CM | POA: Diagnosis not present

## 2017-05-07 ENCOUNTER — Ambulatory Visit (HOSPITAL_COMMUNITY)
Admission: RE | Admit: 2017-05-07 | Discharge: 2017-05-07 | Disposition: A | Discharge status: Home / Self Care | Payer: Medicare HMO | Source: Ambulatory Visit | Attending: Adult Health | Admitting: Adult Health

## 2017-05-07 DIAGNOSIS — D472 Monoclonal gammopathy: Secondary | ICD-10-CM

## 2017-05-07 DIAGNOSIS — C9000 Multiple myeloma not having achieved remission: Secondary | ICD-10-CM

## 2017-05-07 DIAGNOSIS — M50323 Other cervical disc degeneration at C6-C7 level: Secondary | ICD-10-CM | POA: Insufficient documentation

## 2017-05-08 ENCOUNTER — Encounter: Payer: Self-pay | Admitting: Internal Medicine

## 2017-05-09 ENCOUNTER — Inpatient Hospital Stay (HOSPITAL_COMMUNITY): Payer: Medicare HMO | Admitting: Hematology and Oncology

## 2017-05-09 ENCOUNTER — Inpatient Hospital Stay (HOSPITAL_COMMUNITY): Payer: Medicare HMO | Attending: Oncology

## 2017-05-09 ENCOUNTER — Encounter (HOSPITAL_COMMUNITY): Payer: Self-pay | Admitting: Hematology and Oncology

## 2017-05-09 ENCOUNTER — Other Ambulatory Visit: Payer: Self-pay

## 2017-05-09 VITALS — BP 127/62 | HR 68 | Temp 98.0°F | Resp 20 | Wt 211.9 lb

## 2017-05-09 DIAGNOSIS — Z7984 Long term (current) use of oral hypoglycemic drugs: Secondary | ICD-10-CM | POA: Diagnosis not present

## 2017-05-09 DIAGNOSIS — D472 Monoclonal gammopathy: Secondary | ICD-10-CM

## 2017-05-09 DIAGNOSIS — F1020 Alcohol dependence, uncomplicated: Secondary | ICD-10-CM

## 2017-05-09 DIAGNOSIS — Z79899 Other long term (current) drug therapy: Secondary | ICD-10-CM | POA: Diagnosis not present

## 2017-05-09 DIAGNOSIS — E119 Type 2 diabetes mellitus without complications: Secondary | ICD-10-CM | POA: Insufficient documentation

## 2017-05-09 DIAGNOSIS — C9000 Multiple myeloma not having achieved remission: Secondary | ICD-10-CM

## 2017-05-09 DIAGNOSIS — R69 Illness, unspecified: Secondary | ICD-10-CM | POA: Diagnosis not present

## 2017-05-09 DIAGNOSIS — I429 Cardiomyopathy, unspecified: Secondary | ICD-10-CM | POA: Diagnosis not present

## 2017-05-09 DIAGNOSIS — E7800 Pure hypercholesterolemia, unspecified: Secondary | ICD-10-CM

## 2017-05-09 DIAGNOSIS — Z7982 Long term (current) use of aspirin: Secondary | ICD-10-CM

## 2017-05-09 DIAGNOSIS — I119 Hypertensive heart disease without heart failure: Secondary | ICD-10-CM | POA: Diagnosis not present

## 2017-05-09 DIAGNOSIS — E669 Obesity, unspecified: Secondary | ICD-10-CM | POA: Insufficient documentation

## 2017-05-09 DIAGNOSIS — Z87891 Personal history of nicotine dependence: Secondary | ICD-10-CM | POA: Insufficient documentation

## 2017-05-09 LAB — CBC WITH DIFFERENTIAL/PLATELET
BASOS ABS: 0 10*3/uL (ref 0.0–0.1)
Basophils Relative: 0 %
EOS PCT: 2 %
Eosinophils Absolute: 0.1 10*3/uL (ref 0.0–0.7)
HCT: 38.3 % — ABNORMAL LOW (ref 39.0–52.0)
Hemoglobin: 12.4 g/dL — ABNORMAL LOW (ref 13.0–17.0)
LYMPHS PCT: 44 %
Lymphs Abs: 2.2 10*3/uL (ref 0.7–4.0)
MCH: 30 pg (ref 26.0–34.0)
MCHC: 32.4 g/dL (ref 30.0–36.0)
MCV: 92.5 fL (ref 78.0–100.0)
Monocytes Absolute: 0.4 10*3/uL (ref 0.1–1.0)
Monocytes Relative: 9 %
NEUTROS ABS: 2.3 10*3/uL (ref 1.7–7.7)
NEUTROS PCT: 45 %
PLATELETS: 254 10*3/uL (ref 150–400)
RBC: 4.14 MIL/uL — AB (ref 4.22–5.81)
RDW: 14 % (ref 11.5–15.5)
WBC: 5.1 10*3/uL (ref 4.0–10.5)

## 2017-05-09 LAB — COMPREHENSIVE METABOLIC PANEL
ALT: 27 U/L (ref 17–63)
AST: 19 U/L (ref 15–41)
Albumin: 3.7 g/dL (ref 3.5–5.0)
Alkaline Phosphatase: 84 U/L (ref 38–126)
Anion gap: 8 (ref 5–15)
BUN: 21 mg/dL — AB (ref 6–20)
CHLORIDE: 107 mmol/L (ref 101–111)
CO2: 23 mmol/L (ref 22–32)
CREATININE: 1.42 mg/dL — AB (ref 0.61–1.24)
Calcium: 9.2 mg/dL (ref 8.9–10.3)
GFR calc Af Amer: 59 mL/min — ABNORMAL LOW (ref >60)
GFR, EST NON AFRICAN AMERICAN: 51 mL/min — AB (ref >60)
Glucose, Bld: 152 mg/dL — ABNORMAL HIGH (ref 65–99)
Potassium: 4 mmol/L (ref 3.5–5.1)
Sodium: 138 mmol/L (ref 135–145)
Total Bilirubin: 0.2 mg/dL — ABNORMAL LOW (ref 0.3–1.2)
Total Protein: 7.9 g/dL (ref 6.5–8.1)

## 2017-05-09 NOTE — Patient Instructions (Signed)
[place] at [facility] Discharge Instructions  RECOMMENDATIONS MADE BY THE CONSULTANT AND ANY TEST RESULTS WILL BE SENT TO YOUR REFERRING PHYSICIAN.  You were seen today by Dr. Grace Isaac  Thank you for choosing [place] at [facility] to provide your oncology and hematology care.  To afford each patient quality time with our provider, please arrive at least 15 minutes before your scheduled appointment time.    If you have a lab appointment with the [facility] please come in thru the  Main Entrance and check in at the main information desk  You need to re-schedule your appointment should you arrive 10 or more minutes late.  We strive to give you quality time with our providers, and arriving late affects you and other patients whose appointments are after yours.  Also, if you no show three or more times for appointments you may be dismissed from the clinic at the providers discretion.     Again, thank you for choosing [facility].  Our hope is that these requests will decrease the amount of time that you wait before being seen by our physicians.       _____________________________________________________________  Should you have questions after your visit to [facility], please contact our office at [phone] between the hours of 8:30 a.m. and 4:30 p.m.  Voicemails left after 4:30 p.m. will not be returned until the following business day.  For prescription refill requests, have your pharmacy contact our office.       Resources For Cancer Patients and their Caregivers ? American Cancer Society: Can assist with transportation, wigs, general needs, runs Look Good Feel Better.        337-760-2513 ? Cancer Care: Provides financial assistance, online support groups, medication/co-pay assistance.  1-800-813-HOPE (770)441-6516) ? [place] Assists [place] cancer patients and their  families through emotional , educational and financial support.  316-278-6197 ? [place] DSS Where to apply for food stamps, Medicaid and utility assistance. 530-554-9551 ? [employer]ATS: Transportation to medical appointments. (413)871-3567 ? Social Security Administration: May apply for disability if have a Stage IV cancer. 7806938654 317-466-1942 ? [employer], Disability and Transit Services: Assists with nutrition, care and transit needs. [place] Support Programs: @10RELATIVEDAYS @ > Cancer Support Group  2nd Tuesday of the month 1pm-2pm, Journey Room  > Creative Journey  3rd Tuesday of the month 1130am-1pm, Journey Room  > Look Good Feel Better  1st Wednesday of the month 10am-12 noon, Journey Room (Call [place] to register [phone])

## 2017-05-10 LAB — KAPPA/LAMBDA LIGHT CHAINS
KAPPA FREE LGHT CHN: 25.1 mg/L — AB (ref 3.3–19.4)
Kappa, lambda light chain ratio: 0.54 (ref 0.26–1.65)
LAMDA FREE LIGHT CHAINS: 46.5 mg/L — AB (ref 5.7–26.3)

## 2017-05-10 LAB — IGG, IGA, IGM
IGA: 62 mg/dL (ref 61–437)
IGM (IMMUNOGLOBULIN M), SRM: 37 mg/dL (ref 20–172)
IgG (Immunoglobin G), Serum: 1879 mg/dL — ABNORMAL HIGH (ref 700–1600)

## 2017-05-11 LAB — PROTEIN ELECTROPHORESIS, SERUM
A/G Ratio: 0.9 (ref 0.7–1.7)
ALBUMIN ELP: 3.6 g/dL (ref 2.9–4.4)
ALPHA-1-GLOBULIN: 0.2 g/dL (ref 0.0–0.4)
ALPHA-2-GLOBULIN: 0.7 g/dL (ref 0.4–1.0)
Beta Globulin: 1.3 g/dL (ref 0.7–1.3)
GAMMA GLOBULIN: 1.5 g/dL (ref 0.4–1.8)
Globulin, Total: 3.8 g/dL (ref 2.2–3.9)
M-Spike, %: 1 g/dL — ABNORMAL HIGH
Total Protein ELP: 7.4 g/dL (ref 6.0–8.5)

## 2017-05-14 LAB — IMMUNOFIXATION ELECTROPHORESIS
IGM (IMMUNOGLOBULIN M), SRM: 31 mg/dL (ref 20–172)
IgA: 60 mg/dL — ABNORMAL LOW (ref 61–437)
IgG (Immunoglobin G), Serum: 1705 mg/dL — ABNORMAL HIGH (ref 700–1600)
TOTAL PROTEIN ELP: 7.3 g/dL (ref 6.0–8.5)

## 2017-05-20 NOTE — Progress Notes (Signed)
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  Clinical evaluation lab work obtained earlier today demonstrate no evidence of progressive disease.  At this time continued monitoring is indicated.  Plan: -- Return to clinic in 6 months with lab work obtained 1 week prior for continued monitoring for disease progression.  Voice recognition software was used and creation of this note. Despite my best effort at editing the text, some misspelling/errors may have occurred.  Orders Placed This Encounter  Procedures  . CBC with Differential    Standing Status:   Future    Standing Expiration Date:   05/09/2018  . Comprehensive metabolic panel    Standing Status:   Future    Standing Expiration Date:   05/09/2018  . Lactate dehydrogenase    Standing Status:   Future    Standing Expiration Date:   05/09/2018  . Beta 2 microglobuline, serum    Standing Status:   Future    Standing Expiration Date:   05/09/2018  . Multiple myeloma panel, serum    Standing Status:   Future    Standing Expiration Date:   05/09/2018  . Kappa/lambda light chains    Standing Status:   Future    Standing Expiration Date:   05/09/2018    Cancer Staging No matching staging information was found for the patient.  All questions were answered.  . The patient knows to call the clinic with any problems, questions or concerns.  This note was electronically signed.    History of Presenting Illness Corey Murphy 67 y.o. presenting to the [facility] for clinical monitoring for diagnosis of smoldering IgG lambda multiple myeloma.  At the present time, patient denies any new symptoms   Other than some difficulties with his sleep that are currently being evaluated by his primary care provider.  Patient denies any musculoskeletal pain.  Denies any new neuropathy or other neurological symptoms..  Oncological/hematological  History:  No history exists.    Medical History: Past Medical History:  Diagnosis Date  . Alcohol dependency ([place])   . Colonic adenoma 2006  . Essential hypertension, benign   . History of cardiac catheterization    03/2013 LM nl, LAD nl, D1/2/3 small - nl, LCX nondom - nl, OM1/2/3 nl, RCA dom  . Hypercholesteremia   . Hypertensive heart disease   . 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 Nonischemic cardiomyopathy (HCC)    LVEF approximately 40% 06/2013 - improved to normal range  . Obesity   . Type 2 diabetes mellitus [facility])     Surgical History: Past Surgical History:  Procedure Laterality Date  . COLONOSCOPY  05/20/2004   RMR: Internal hemorrhoids.  Diminutive adenomatous polyp in the mid-right colon, otherwise normal  . COLONOSCOPY N/A 06/19/2012   Procedure: COLONOSCOPY;  Surgeon: Daneil Dolin, MD;  Location: [facility];  Service: Endoscopy;  Laterality: N/A;  8:30AM-rescheduled 10:30am Darius Bump notified pt  . Ileocolonoscopy  06/28/2007   LGX:QJJHER rectum/Submucosal sigmoid diverticula (chronic), doubt clinical significance Hyperplastic polypoid-appearing fold, mid ascending colon, status/Remainder colonic mucosa and terminal ileum mucosa appeared normal   . LEFT AND RIGHT HEART CATHETERIZATION WITH CORONARY ANGIOGRAM  03/19/2013   Procedure: LEFT AND RIGHT HEART CATHETERIZATION WITH CORONARY ANGIOGRAM;  Surgeon: Wellington Hampshire, MD;  Location: [facility] CATH LAB;  Service: Cardiovascular;;  . TOTAL HIP ARTHROPLASTY  2010   Left  . TOTAL HIP ARTHROPLASTY  2010  Right    Family History: Family History  Problem Relation Age of Onset  . Heart failure Mother   . Diabetes Mother   . Hypertension Mother   . Heart failure Sister   . Hypertension Sister   . Cancer Sister   . Colon cancer Neg Hx     Social History: Social History   Socioeconomic History  . Marital status: Married    Spouse name: Not on file  . Number of children: 2  . Years of education:  Not on file  . Highest education level: Not on file  Social Needs  . Financial resource strain: Not on file  . Food insecurity - worry: Not on file  . Food insecurity - inability: Not on file  . Transportation needs - medical: Not on file  . Transportation needs - non-medical: Not on file  Occupational History  . Occupation: Retired, Arts development officer  . Smoking status: Former Smoker    Packs/day: 1.00    Years: 12.00    Pack years: 12.00    Types: Cigarettes    Last attempt to quit: 05/28/2008    Years since quitting: 8.9  . Smokeless tobacco: Never Used  Substance and Sexual Activity  . Alcohol use: No    Alcohol/week: 0.0 oz    Comment: Quit in 2008.  Used to drink 12-14 beers on weekends only  . Drug use: Yes    Types: Marijuana    Comment: Smoked via a bowl occassionally.  . Sexual activity: Not on file  Other Topics Concern  . Not on file  Social History Narrative  . Not on file    Allergies: No Known Allergies  Medications:  Current Outpatient Medications  Medication Sig Dispense Refill  . aspirin 81 MG tablet Take 81 mg by mouth daily.    . carvedilol (COREG) 12.5 MG tablet TAKE 1 TABLET BY MOUTH TWICE A DAY WITH FOOD 180 tablet 3  . furosemide (LASIX) 20 MG tablet TAKE 1 TABLET BY MOUTH EVERY DAY 90 tablet 3  . glimepiride (AMARYL) 4 MG tablet Take 4 mg by mouth 2 (two) times daily.  3  . HYDROcodone-acetaminophen (NORCO) 10-325 MG tablet Take 1 tablet by mouth every 6 (six) hours as needed.    Marland Kitchen lisinopril (PRINIVIL,ZESTRIL) 2.5 MG tablet Take 2.5 mg by mouth daily.    . ONE TOUCH ULTRA TEST test strip     . simvastatin (ZOCOR) 20 MG tablet TK 1 T PO D  3  . spironolactone (ALDACTONE) 25 MG tablet TAKE HALF A TABLET BY MOUTH DAILY 45 tablet 6   No current facility-administered medications for this visit.     Review of Systems: Review of Systems  Psychiatric/Behavioral: Positive for sleep disturbance.  All other systems reviewed and are  negative.    PHYSICAL EXAMINATION Blood pressure 127/62, pulse 68, temperature 98 F (36.7 C), temperature source Oral, resp. rate 20, weight 211 lb 14.4 oz (96.1 kg), SpO2 99 %.  ECOG PERFORMANCE STATUS: 1 - Symptomatic but completely ambulatory  Physical Exam  Constitutional: He is oriented to person, place, and time and well-developed, well-nourished, and in no distress. No distress.  HENT:  Head: Normocephalic and atraumatic.  Mouth/Throat: Oropharynx is clear and moist. No oropharyngeal exudate.  Eyes: Conjunctivae and EOM are normal. Pupils are equal, round, and reactive to light. No scleral icterus.  Neck: No thyromegaly present.  Cardiovascular: Normal rate, regular rhythm and normal heart sounds.  No murmur heard. Pulmonary/Chest: Effort normal  and breath sounds normal. No respiratory distress. He has no wheezes. He has no rales.  Abdominal: Soft. Bowel sounds are normal. He exhibits no distension. There is no tenderness. There is no rebound and no guarding.  Musculoskeletal: He exhibits no edema.  Lymphadenopathy:    He has no cervical adenopathy.  Neurological: He is alert and oriented to person, place, and time. He has normal reflexes. No cranial nerve deficit.  Skin: Skin is warm and dry. No rash noted. He is not diaphoretic. No erythema.     LABORATORY DATA: I have personally reviewed the data as listed: Appointment on 05/09/2017  Component Date Value Ref Range Status  . WBC 05/09/2017 5.1  4.0 - 10.5 K/uL Final  . RBC 05/09/2017 4.14* 4.22 - 5.81 MIL/uL Final  . Hemoglobin 05/09/2017 12.4* 13.0 - 17.0 g/dL Final  . HCT 05/09/2017 38.3* 39.0 - 52.0 % Final  . MCV 05/09/2017 92.5  78.0 - 100.0 fL Final  . MCH 05/09/2017 30.0  26.0 - 34.0 pg Final  . MCHC 05/09/2017 32.4  30.0 - 36.0 g/dL Final  . RDW 05/09/2017 14.0  11.5 - 15.5 % Final  . Platelets 05/09/2017 254  150 - 400 K/uL Final  . Neutrophils Relative % 05/09/2017 45  % Final  . Neutro Abs 05/09/2017 2.3   1.7 - 7.7 K/uL Final  . Lymphocytes Relative 05/09/2017 44  % Final  . Lymphs Abs 05/09/2017 2.2  0.7 - 4.0 K/uL Final  . Monocytes Relative 05/09/2017 9  % Final  . Monocytes Absolute 05/09/2017 0.4  0.1 - 1.0 K/uL Final  . Eosinophils Relative 05/09/2017 2  % Final  . Eosinophils Absolute 05/09/2017 0.1  0.0 - 0.7 K/uL Final  . Basophils Relative 05/09/2017 0  % Final  . Basophils Absolute 05/09/2017 0.0  0.0 - 0.1 K/uL Final  . Sodium 05/09/2017 138  135 - 145 mmol/L Final  . Potassium 05/09/2017 4.0  3.5 - 5.1 mmol/L Final  . Chloride 05/09/2017 107  101 - 111 mmol/L Final  . CO2 05/09/2017 23  22 - 32 mmol/L Final  . Glucose, Bld 05/09/2017 152* 65 - 99 mg/dL Final  . BUN 05/09/2017 21* 6 - 20 mg/dL Final  . Creatinine, Ser 05/09/2017 1.42* 0.61 - 1.24 mg/dL Final  . Calcium 05/09/2017 9.2  8.9 - 10.3 mg/dL Final  . Total Protein 05/09/2017 7.9  6.5 - 8.1 g/dL Final  . Albumin 05/09/2017 3.7  3.5 - 5.0 g/dL Final  . AST 05/09/2017 19  15 - 41 U/L Final  . ALT 05/09/2017 27  17 - 63 U/L Final  . Alkaline Phosphatase 05/09/2017 84  38 - 126 U/L Final  . Total Bilirubin 05/09/2017 0.2* 0.3 - 1.2 mg/dL Final  . GFR calc non Af Amer 05/09/2017 51* >60 mL/min Final  . GFR calc Af Amer 05/09/2017 59* >60 mL/min Final   Comment: (NOTE) The eGFR has been calculated using the CKD EPI equation. This calculation has not been validated in all clinical situations. eGFR's persistently <60 mL/min signify possible Chronic Kidney Disease.   . Anion gap 05/09/2017 8  5 - 15 Final  . Kappa free light chain 05/09/2017 25.1* 3.3 - 19.4 mg/L Final  . Lamda free light chains 05/09/2017 46.5* 5.7 - 26.3 mg/L Final  . Kappa, lamda light chain ratio 05/09/2017 0.54  0.26 - 1.65 Final   Comment: (NOTE) Performed At: [facility] [place], [place] [id number] Rush Farmer MD [id number]   .  IgG (Immunoglobin G), Serum 05/09/2017 1,879* 700 - 1,600 mg/dL Final  . IgA  05/09/2017 62  61 - 437 mg/dL Final  . IgM (Immunoglobulin M), Srm 05/09/2017 37  20 - 172 mg/dL Final   Comment: (NOTE) Performed At: [facility] [place], [place] [id number] Rush Farmer MD [id number]   . Total Protein ELP 05/09/2017 7.3  6.0 - 8.5 g/dL Final  . IgG (Immunoglobin G), Serum 05/09/2017 1,705* 700 - 1,600 mg/dL Final  . IgA 05/09/2017 60* 61 - 437 mg/dL Final  . IgM (Immunoglobulin M), Srm 05/09/2017 31  20 - 172 mg/dL Final   Comment: (NOTE) Performed At: [facility] [place], [place] [id number] Rush Farmer MD [id number]   . Immunofixation Result, Serum 05/09/2017 Comment   Corrected   Comment: (NOTE) Immunofixation shows IgG monoclonal protein with lambda light chain specificity.   . Total Protein ELP 05/09/2017 7.4  6.0 - 8.5 g/dL Final  . Albumin ELP 05/09/2017 3.6  2.9 - 4.4 g/dL Final  . Alpha-1-Globulin 05/09/2017 0.2  0.0 - 0.4 g/dL Final  . Alpha-2-Globulin 05/09/2017 0.7  0.4 - 1.0 g/dL Final  . Beta Globulin 05/09/2017 1.3  0.7 - 1.3 g/dL Final  . Gamma Globulin 05/09/2017 1.5  0.4 - 1.8 g/dL Final  . M-Spike, % 05/09/2017 1.0* Not Observed g/dL Final  . SPE Interp. 05/09/2017 Comment   Final   Comment: (NOTE) The SPE pattern demonstrates a single peak (M-spike) in the gamma region which may represent monoclonal protein. This peak may also be caused by circulating immune complexes, cryoglobulins, C-reactive protein, fibrinogen or hemolysis.  If clinically indicated, the presence of a monoclonal gammopathy may be confirmed by immuno- fixation, as well as an evaluation of the urine for the presence of Bence-Jones protein. Performed At: [facility] [place], [place] [id number] Rush Farmer MD PH:[id number]   . Comment 05/09/2017 Comment   Final   Comment: (NOTE) Protein electrophoresis scan will follow via computer, mail, or courier delivery.   Marland Kitchen GLOBULIN, TOTAL  05/09/2017 3.8  2.2 - 3.9 g/dL Corrected  . A/G Ratio 05/09/2017 0.9  0.7 - 1.7 Corrected       Ardath Sax, MD

## 2017-05-20 NOTE — Assessment & Plan Note (Signed)
66 y.o. male with diagnosis of smoldering multiple myeloma.  Clinical evaluation lab work obtained earlier today demonstrate no evidence of progressive disease.  At this time continued monitoring is indicated.  Plan: -- Return to clinic in 6 months with lab work obtained 1 week prior for continued monitoring for disease progression. 

## 2017-05-31 ENCOUNTER — Ambulatory Visit: Payer: Medicare HMO

## 2017-06-07 ENCOUNTER — Ambulatory Visit (INDEPENDENT_AMBULATORY_CARE_PROVIDER_SITE_OTHER): Payer: Self-pay

## 2017-06-07 DIAGNOSIS — Z86010 Personal history of colonic polyps: Secondary | ICD-10-CM

## 2017-06-07 MED ORDER — PEG 3350-KCL-NA BICARB-NACL 420 G PO SOLR: 4000.0000 mL | ORAL | 0 refills | Status: DC

## 2017-06-07 NOTE — Progress Notes (Signed)
Gastroenterology Pre-Procedure Review  Request Date:06/07/17 Requesting Physician: Dr.Rourk 5 year recall  Last tcs 06/19/12 tubular adenoma   PATIENT REVIEW QUESTIONS: The patient responded to the following health history questions as indicated:    1. Diabetes Melitis: yes (glimipiride) 2. Joint replacements in the past 12 months: no 3. Major health problems in the past 3 months: no 4. Has an artificial valve or MVP: no 5. Has a defibrillator: no 6. Has been advised in past to take antibiotics in advance of a procedure like teeth cleaning: yes (2010 after hip replacement) 7. Family history of colon cancer: no  8. Alcohol Use: yes (socially) 9. History of sleep apnea: no  10. History of coronary artery or other vascular stents placed within the last 12 months: no 11. History of any prior anesthesia complications: no    MEDICATIONS & ALLERGIES:    Patient reports the following regarding taking any blood thinners:   Plavix? no Aspirin? yes (81mg ) Coumadin? no Brilinta? no Xarelto? no Eliquis? no Pradaxa? no Savaysa? no Effient? no  Patient confirms/reports the following medications:  Current Outpatient Medications  Medication Sig Dispense Refill  . aspirin 81 MG tablet Take 81 mg by mouth daily.    . carvedilol (COREG) 12.5 MG tablet TAKE 1 TABLET BY MOUTH TWICE A DAY WITH FOOD 180 tablet 3  . furosemide (LASIX) 20 MG tablet TAKE 1 TABLET BY MOUTH EVERY DAY (Patient taking differently: TAKE 1 TABLET BY MOUTH EVERY DAY prn) 90 tablet 3  . glimepiride (AMARYL) 4 MG tablet Take 4 mg by mouth 2 (two) times daily.  3  . HYDROcodone-acetaminophen (NORCO) 10-325 MG tablet Take 1 tablet by mouth every 6 (six) hours as needed.    Marland Kitchen lisinopril (PRINIVIL,ZESTRIL) 2.5 MG tablet Take 2.5 mg by mouth daily.    . Multiple Vitamins-Iron (MULTIVITAMIN/IRON PO) Take by mouth.    . simvastatin (ZOCOR) 20 MG tablet TK 1 T PO D  3  . spironolactone (ALDACTONE) 25 MG tablet TAKE HALF A TABLET BY MOUTH  DAILY 45 tablet 6  . ONE TOUCH ULTRA TEST test strip      No current facility-administered medications for this visit.     Patient confirms/reports the following allergies:  No Known Allergies  No orders of the defined types were placed in this encounter.   AUTHORIZATION INFORMATION Primary Insurance: [employer] medicare   ID #: [id number] Pre-Cert / Auth required: no    SCHEDULE INFORMATION: Procedure has been scheduled as follows:  Date: 08/08/17, Time: 10:30 Location: [facility] Dr.Rourk  This Gastroenterology Pre-Precedure Review Form is being routed to the following provider(s): Neil Crouch PA-C

## 2017-06-07 NOTE — Patient Instructions (Signed)
Corey Murphy   Jul 09, 1950 MRN: [id number]    Procedure Date: 08/08/17 Time to register: 9:30 Place to register: [facility] Procedure Time: 10:30 Scheduled provider: [place] WITH TRI-LYTE SPLIT PREP  Please notify us immediately if you are diabetic, take iron supplements, or if you are on Coumadin or any other blood thinners.   Please hold the following medications: will mail a letter with this information  You will need to purchase 1 fleet enema and 1 box of Bisacodyl 51m tablets.   2 DAYS BEFORE PROCEDURE:  DATE: 08/06/17   DAY: Monday Begin clear liquid diet AFTER your lunch meal. NO SOLID FOODS after this point.  1 DAY BEFORE PROCEDURE:  DATE: 08/07/17   DAY: Tuesday Continue clear liquids the entire day - NO SOLID FOOD.   Diabetic medications adjustments for today: see letter  At 2:00 pm:  Take 2 Bisacodyl tablets.   At 4:00pm:  Start drinking your solution. Make sure you mix well per instructions on the bottle. Try to drink 1 (one) 8 ounce glass every 10-15 minutes until you have consumed HALF the jug. You should complete by 6:00pm.You must keep the left over solution refrigerated until completed next day.  Continue clear liquids. You must drink plenty of clear liquids to prevent dehyration and kidney failure. Nothing to eat or drink after midnight.  EXCEPTION: If you take medications for your heart, blood pressure or breathing, you may take these medications with a small amount of clear liquid.    DAY OF PROCEDURE:   DATE: 08/08/17  DAY: Wednesday  Diabetic medications adjustments for today: see letter  Five hours before your procedure time @ 5:30am:  Finish remaining amout of bowel prep, drinking 1 (one) 8 ounce glass every 10-15 minutes until complete. You have two hours to consume remaining prep.   Three hours before your procedure time @7 :30am:  Nothing by mouth.   At least one hour before going to the hospital:  Give yourself  one Fleet enema. You may take your morning medications with sip of water unless we have instructed otherwise.      Please see below for Dietary Information.  CLEAR LIQUIDS INCLUDE:  Water Jello (NOT red in color)   Ice Popsicles (NOT red in color)   Tea (sugar ok, no milk/cream) Powdered fruit flavored drinks  Coffee (sugar ok, no milk/cream) Gatorade/ Lemonade/ Kool-Aid  (NOT red in color)   Juice: apple, white grape, white cranberry Soft drinks  Clear bullion, consomme, broth (fat free beef/chicken/vegetable)  Carbonated beverages (any kind)  Strained chicken noodle soup Hard Candy   Remember: Clear liquids are liquids that will allow you to see your fingers on the other side of a clear glass. Be sure liquids are NOT red in color, and not cloudy, but CLEAR.  DO NOT EAT OR DRINK ANY OF THE FOLLOWING:  Dairy products of any kind   Cranberry juice Tomato juice / V8 juice   Grapefruit juice Orange juice     Red grape juice  Do not eat any solid foods, including such foods as: cereal, oatmeal, yogurt, fruits, vegetables, creamed soups, eggs, bread, crackers, pureed foods in a blender, etc.   HELPFUL HINTS FOR DRINKING PREP SOLUTION:   Make sure prep is extremely cold. Mix and refrigerate the the morning of the prep. You may also put in the freezer.   You may try mixing some Crystal Light or Country Time Lemonade if you prefer. Mix in small amounts;  add more if necessary.  Try drinking through a straw  Rinse mouth with water or a mouthwash between glasses, to remove after-taste.  Try sipping on a cold beverage /ice/ popsicles between glasses of prep.  Place a piece of sugar-free hard candy in mouth between glasses.  If you become nauseated, try consuming smaller amounts, or stretch out the time between glasses. Stop for 30-60 minutes, then slowly start back drinking.        OTHER INSTRUCTIONS  You will need a responsible adult at least 65 years of age to accompany you  and drive you home. This person must remain in the waiting room during your procedure. The hospital will cancel your procedure if you do not have a responsible adult with you.   1. Wear loose fitting clothing that is easily removed. 2. Leave jewelry and other valuables at home.  3. Remove all body piercing jewelry and leave at home. 4. Total time from sign-in until discharge is approximately 2-3 hours. 5. You should go home directly after your procedure and rest. You can resume normal activities the day after your procedure. 6. The day of your procedure you should not:  Drive  Make legal decisions  Operate machinery  Drink alcohol  Return to work   You may call the office (Dept: [phone]) before 5:00pm, or page the doctor on call [phone]) after 5:00pm, for further instructions, if necessary.   Insurance Information YOU WILL NEED TO CHECK WITH YOUR INSURANCE COMPANY FOR THE BENEFITS OF COVERAGE YOU HAVE FOR THIS PROCEDURE.  UNFORTUNATELY, NOT ALL INSURANCE COMPANIES HAVE BENEFITS TO COVER ALL OR PART OF THESE TYPES OF PROCEDURES.  IT IS YOUR RESPONSIBILITY TO CHECK YOUR BENEFITS, HOWEVER, WE WILL BE GLAD TO ASSIST YOU WITH ANY CODES YOUR INSURANCE COMPANY MAY NEED.    PLEASE NOTE THAT MOST INSURANCE COMPANIES WILL NOT COVER A SCREENING COLONOSCOPY FOR PEOPLE UNDER THE AGE OF 50  IF YOU HAVE BCBS INSURANCE, YOU MAY HAVE BENEFITS FOR A SCREENING COLONOSCOPY BUT IF POLYPS ARE FOUND THE DIAGNOSIS WILL CHANGE AND THEN YOU MAY HAVE A DEDUCTIBLE THAT WILL NEED TO BE MET. SO PLEASE MAKE SURE YOU CHECK YOUR BENEFITS FOR A SCREENING COLONOSCOPY AS WELL AS A DIAGNOSTIC COLONOSCOPY.

## 2017-06-08 NOTE — Progress Notes (Signed)
Ok to schedule. Hydrocodone rx from 06/2015. Does not take regularly. Day before TCS: glimepiride 1/2 tablet bid.  Am of TCS: hold glimepiride

## 2017-06-11 DIAGNOSIS — E669 Obesity, unspecified: Secondary | ICD-10-CM | POA: Diagnosis not present

## 2017-06-11 DIAGNOSIS — B070 Plantar wart: Secondary | ICD-10-CM | POA: Diagnosis not present

## 2017-06-11 DIAGNOSIS — Z6832 Body mass index (BMI) 32.0-32.9, adult: Secondary | ICD-10-CM | POA: Diagnosis not present

## 2017-06-11 DIAGNOSIS — I5032 Chronic diastolic (congestive) heart failure: Secondary | ICD-10-CM | POA: Diagnosis not present

## 2017-06-11 DIAGNOSIS — D472 Monoclonal gammopathy: Secondary | ICD-10-CM | POA: Diagnosis not present

## 2017-06-11 DIAGNOSIS — E1142 Type 2 diabetes mellitus with diabetic polyneuropathy: Secondary | ICD-10-CM | POA: Diagnosis not present

## 2017-06-11 DIAGNOSIS — E663 Overweight: Secondary | ICD-10-CM | POA: Diagnosis not present

## 2017-06-11 DIAGNOSIS — N183 Chronic kidney disease, stage 3 (moderate): Secondary | ICD-10-CM | POA: Diagnosis not present

## 2017-06-11 DIAGNOSIS — R69 Illness, unspecified: Secondary | ICD-10-CM | POA: Diagnosis not present

## 2017-06-11 DIAGNOSIS — I428 Other cardiomyopathies: Secondary | ICD-10-CM | POA: Diagnosis not present

## 2017-06-11 NOTE — Progress Notes (Signed)
Letter mailed to the pt. 

## 2017-07-10 DIAGNOSIS — E1140 Type 2 diabetes mellitus with diabetic neuropathy, unspecified: Secondary | ICD-10-CM | POA: Diagnosis not present

## 2017-07-10 DIAGNOSIS — E1151 Type 2 diabetes mellitus with diabetic peripheral angiopathy without gangrene: Secondary | ICD-10-CM | POA: Diagnosis not present

## 2017-07-18 DIAGNOSIS — R69 Illness, unspecified: Secondary | ICD-10-CM | POA: Diagnosis not present

## 2017-08-01 DIAGNOSIS — N183 Chronic kidney disease, stage 3 (moderate): Secondary | ICD-10-CM | POA: Diagnosis not present

## 2017-08-08 ENCOUNTER — Encounter (HOSPITAL_COMMUNITY): Payer: Self-pay | Admitting: *Deleted

## 2017-08-08 ENCOUNTER — Other Ambulatory Visit: Payer: Self-pay

## 2017-08-08 ENCOUNTER — Ambulatory Visit (HOSPITAL_COMMUNITY)
Admission: RE | Admit: 2017-08-08 | Discharge: 2017-08-08 | Disposition: A | Discharge status: Home / Self Care | Payer: Medicare HMO | Source: Ambulatory Visit | Attending: Internal Medicine | Admitting: Internal Medicine

## 2017-08-08 ENCOUNTER — Encounter (HOSPITAL_COMMUNITY)
Admission: RE | Disposition: A | Discharge status: Home / Self Care | Payer: Self-pay | Source: Ambulatory Visit | Attending: Internal Medicine

## 2017-08-08 DIAGNOSIS — Z6829 Body mass index (BMI) 29.0-29.9, adult: Secondary | ICD-10-CM | POA: Diagnosis not present

## 2017-08-08 DIAGNOSIS — Z7984 Long term (current) use of oral hypoglycemic drugs: Secondary | ICD-10-CM | POA: Diagnosis not present

## 2017-08-08 DIAGNOSIS — Z86010 Personal history of colonic polyps: Secondary | ICD-10-CM | POA: Diagnosis not present

## 2017-08-08 DIAGNOSIS — I119 Hypertensive heart disease without heart failure: Secondary | ICD-10-CM | POA: Insufficient documentation

## 2017-08-08 DIAGNOSIS — Z1211 Encounter for screening for malignant neoplasm of colon: Secondary | ICD-10-CM | POA: Diagnosis present

## 2017-08-08 DIAGNOSIS — E7800 Pure hypercholesterolemia, unspecified: Secondary | ICD-10-CM | POA: Diagnosis not present

## 2017-08-08 DIAGNOSIS — D122 Benign neoplasm of ascending colon: Secondary | ICD-10-CM | POA: Diagnosis not present

## 2017-08-08 DIAGNOSIS — E669 Obesity, unspecified: Secondary | ICD-10-CM | POA: Insufficient documentation

## 2017-08-08 DIAGNOSIS — I429 Cardiomyopathy, unspecified: Secondary | ICD-10-CM | POA: Diagnosis not present

## 2017-08-08 DIAGNOSIS — Z7982 Long term (current) use of aspirin: Secondary | ICD-10-CM | POA: Diagnosis not present

## 2017-08-08 DIAGNOSIS — Z87891 Personal history of nicotine dependence: Secondary | ICD-10-CM | POA: Insufficient documentation

## 2017-08-08 DIAGNOSIS — K5730 Diverticulosis of large intestine without perforation or abscess without bleeding: Secondary | ICD-10-CM | POA: Insufficient documentation

## 2017-08-08 DIAGNOSIS — E119 Type 2 diabetes mellitus without complications: Secondary | ICD-10-CM | POA: Insufficient documentation

## 2017-08-08 DIAGNOSIS — Z96643 Presence of artificial hip joint, bilateral: Secondary | ICD-10-CM | POA: Diagnosis not present

## 2017-08-08 DIAGNOSIS — Z79899 Other long term (current) drug therapy: Secondary | ICD-10-CM | POA: Diagnosis not present

## 2017-08-08 HISTORY — PX: COLONOSCOPY: SHX5424

## 2017-08-08 HISTORY — PX: POLYPECTOMY: SHX5525

## 2017-08-08 LAB — GLUCOSE, CAPILLARY: Glucose-Capillary: 95 mg/dL (ref 65–99)

## 2017-08-08 SURGERY — COLONOSCOPY
Anesthesia: Moderate Sedation

## 2017-08-08 MED ORDER — MEPERIDINE HCL 100 MG/ML IJ SOLN
INTRAMUSCULAR | Status: AC
  Filled 2017-08-08: qty 2

## 2017-08-08 MED ORDER — STERILE WATER FOR IRRIGATION IR SOLN
Status: DC | PRN
  Administered 2017-08-08: 2.5 mL

## 2017-08-08 MED ORDER — MIDAZOLAM HCL 5 MG/5ML IJ SOLN
INTRAMUSCULAR | Status: AC
  Filled 2017-08-08: qty 10

## 2017-08-08 MED ORDER — ONDANSETRON HCL 4 MG/2ML IJ SOLN
INTRAMUSCULAR | Status: DC | PRN
  Administered 2017-08-08: 4 mg via INTRAVENOUS

## 2017-08-08 MED ORDER — MEPERIDINE HCL 100 MG/ML IJ SOLN
INTRAMUSCULAR | Status: DC | PRN
  Administered 2017-08-08: 50 mg via INTRAVENOUS
  Administered 2017-08-08: 25 mg via INTRAVENOUS

## 2017-08-08 MED ORDER — MIDAZOLAM HCL 5 MG/5ML IJ SOLN
INTRAMUSCULAR | Status: DC | PRN
  Administered 2017-08-08 (×2): 2 mg via INTRAVENOUS
  Administered 2017-08-08: 1 mg via INTRAVENOUS

## 2017-08-08 MED ORDER — ONDANSETRON HCL 4 MG/2ML IJ SOLN
INTRAMUSCULAR | Status: AC
  Filled 2017-08-08: qty 2

## 2017-08-08 MED ORDER — SODIUM CHLORIDE 0.9 % IV SOLN
INTRAVENOUS | Status: DC
  Administered 2017-08-08: 10:00:00 via INTRAVENOUS

## 2017-08-08 NOTE — Discharge Instructions (Signed)
Colonoscopy Discharge Instructions  Read the instructions outlined below and refer to this sheet in the next few weeks. These discharge instructions provide you with general information on caring for yourself after you leave the hospital. Your doctor may also give you specific instructions. While your treatment has been planned according to the most current medical practices available, unavoidable complications occasionally occur. If you have any problems or questions after discharge, call Dr. Gala Romney at [phone]. ACTIVITY  You may resume your regular activity, but move at a slower pace for the next 24 hours.   Take frequent rest periods for the next 24 hours.   Walking will help get rid of the air and reduce the bloated feeling in your belly (abdomen).   No driving for 24 hours (because of the medicine (anesthesia) used during the test).    Do not sign any important legal documents or operate any machinery for 24 hours (because of the anesthesia used during the test).  NUTRITION  Drink plenty of fluids.   You may resume your normal diet as instructed by your doctor.   Begin with a light meal and progress to your normal diet. Heavy or fried foods are harder to digest and may make you feel sick to your stomach (nauseated).   Avoid alcoholic beverages for 24 hours or as instructed.  MEDICATIONS  You may resume your normal medications unless your doctor tells you otherwise.  WHAT YOU CAN EXPECT TODAY  Some feelings of bloating in the abdomen.   Passage of more gas than usual.   Spotting of blood in your stool or on the toilet paper.  IF YOU HAD POLYPS REMOVED DURING THE COLONOSCOPY:  No aspirin products for 7 days or as instructed.   No alcohol for 7 days or as instructed.   Eat a soft diet for the next 24 hours.  FINDING OUT THE RESULTS OF YOUR TEST Not all test results are available during your visit. If your test results are not back during the visit, make an appointment  with your caregiver to find out the results. Do not assume everything is normal if you have not heard from your caregiver or the medical facility. It is important for you to follow up on all of your test results.  SEEK IMMEDIATE MEDICAL ATTENTION IF:  You have more than a spotting of blood in your stool.   Your belly is swollen (abdominal distention).   You are nauseated or vomiting.   You have a temperature over 101.   You have abdominal pain or discomfort that is severe or gets worse throughout the day.     Colonoscopy Discharge Instructions  Read the instructions outlined below and refer to this sheet in the next few weeks. These discharge instructions provide you with general information on caring for yourself after you leave the hospital. Your doctor may also give you specific instructions. While your treatment has been planned according to the most current medical practices available, unavoidable complications occasionally occur. If you have any problems or questions after discharge, call Dr. Gala Romney at [phone]. ACTIVITY  You may resume your regular activity, but move at a slower pace for the next 24 hours.   Take frequent rest periods for the next 24 hours.   Walking will help get rid of the air and reduce the bloated feeling in your belly (abdomen).   No driving for 24 hours (because of the medicine (anesthesia) used during the test).    Do not sign any important  legal documents or operate any machinery for 24 hours (because of the anesthesia used during the test).  NUTRITION  Drink plenty of fluids.   You may resume your normal diet as instructed by your doctor.   Begin with a light meal and progress to your normal diet. Heavy or fried foods are harder to digest and may make you feel sick to your stomach (nauseated).   Avoid alcoholic beverages for 24 hours or as instructed.  MEDICATIONS  You may resume your normal medications unless your doctor tells you otherwise.    WHAT YOU CAN EXPECT TODAY  Some feelings of bloating in the abdomen.   Passage of more gas than usual.   Spotting of blood in your stool or on the toilet paper.  IF YOU HAD POLYPS REMOVED DURING THE COLONOSCOPY:  No aspirin products for 7 days or as instructed.   No alcohol for 7 days or as instructed.   Eat a soft diet for the next 24 hours.  FINDING OUT THE RESULTS OF YOUR TEST Not all test results are available during your visit. If your test results are not back during the visit, make an appointment with your caregiver to find out the results. Do not assume everything is normal if you have not heard from your caregiver or the medical facility. It is important for you to follow up on all of your test results.  SEEK IMMEDIATE MEDICAL ATTENTION IF:  You have more than a spotting of blood in your stool.   Your belly is swollen (abdominal distention).   You are nauseated or vomiting.   You have a temperature over 101.   You have abdominal pain or discomfort that is severe or gets worse throughout the day.    Diverticulosis and colon polyp information provided  Further recommendations to follow pending review of pathology report   Colon Polyps Polyps are tissue growths inside the body. Polyps can grow in many places, including the large intestine (colon). A polyp may be a round bump or a mushroom-shaped growth. You could have one polyp or several. Most colon polyps are noncancerous (benign). However, some colon polyps can become cancerous over time. What are the causes? The exact cause of colon polyps is not known. What increases the risk? This condition is more likely to develop in people who:  Have a family history of colon cancer or colon polyps.  Are older than 74 or older than 45 if they are African American.  Have inflammatory bowel disease, such as ulcerative colitis or Crohn disease.  Are overweight.  Smoke cigarettes.  Do not get enough exercise.  Drink  too much alcohol.  Eat a diet that is: ? High in fat and red meat. ? Low in fiber.  Had childhood cancer that was treated with abdominal radiation.  What are the signs or symptoms? Most polyps do not cause symptoms. If you have symptoms, they may include:  Blood coming from your rectum when having a bowel movement.  Blood in your stool.The stool may look dark red or black.  A change in bowel habits, such as constipation or diarrhea.  How is this diagnosed? This condition is diagnosed with a colonoscopy. This is a procedure that uses a lighted, flexible scope to look at the inside of your colon. How is this treated? Treatment for this condition involves removing any polyps that are found. Those polyps will then be tested for cancer. If cancer is found, your health care provider will talk to  you about options for colon cancer treatment. Follow these instructions at home: Diet  Eat plenty of fiber, such as fruits, vegetables, and whole grains.  Eat foods that are high in calcium and vitamin D, such as milk, cheese, yogurt, eggs, liver, fish, and broccoli.  Limit foods high in fat, red meats, and processed meats, such as hot dogs, sausage, bacon, and lunch meats.  Maintain a healthy weight, or lose weight if recommended by your health care provider. General instructions  Do not smoke cigarettes.  Do not drink alcohol excessively.  Keep all follow-up visits as told by your health care provider. This is important. This includes keeping regularly scheduled colonoscopies. Talk to your health care provider about when you need a colonoscopy.  Exercise every day or as told by your health care provider. Contact a health care provider if:  You have new or worsening bleeding during a bowel movement.  You have new or increased blood in your stool.  You have a change in bowel habits.  You unexpectedly lose weight. This information is not intended to replace advice given to you by  your health care provider. Make sure you discuss any questions you have with your health care provider. Document Released: 01/12/2004 Document Revised: 09/23/2015 Document Reviewed: 03/08/2015 Elsevier Interactive Patient Education  [employer].  Diverticulosis Diverticulosis is a condition that develops when small pouches (diverticula) form in the wall of the large intestine (colon). The colon is where water is absorbed and stool is formed. The pouches form when the inside layer of the colon pushes through weak spots in the outer layers of the colon. You may have a few pouches or many of them. What are the causes? The cause of this condition is not known. What increases the risk? The following factors may make you more likely to develop this condition:  Being older than age 31. Your risk for this condition increases with age. Diverticulosis is rare among people younger than age 29. By age 64, many people have it.  Eating a low-fiber diet.  Having frequent constipation.  Being overweight.  Not getting enough exercise.  Smoking.  Taking over-the-counter pain medicines, like aspirin and ibuprofen.  Having a family history of diverticulosis.  What are the signs or symptoms? In most people, there are no symptoms of this condition. If you do have symptoms, they may include:  Bloating.  Cramps in the abdomen.  Constipation or diarrhea.  Pain in the lower left side of the abdomen.  How is this diagnosed? This condition is most often diagnosed during an exam for other colon problems. Because diverticulosis usually has no symptoms, it often cannot be diagnosed independently. This condition may be diagnosed by:  Using a flexible scope to examine the colon (colonoscopy).  Taking an X-ray of the colon after dye has been put into the colon (barium enema).  Doing a CT scan.  How is this treated? You may not need treatment for this condition if you have never developed an  infection related to diverticulosis. If you have had an infection before, treatment may include:  Eating a high-fiber diet. This may include eating more fruits, vegetables, and grains.  Taking a fiber supplement.  Taking a live bacteria supplement (probiotic).  Taking medicine to relax your colon.  Taking antibiotic medicines.  Follow these instructions at home:  Drink 6-8 glasses of water or more each day to prevent constipation.  Try not to strain when you have a bowel movement.  If you  have had an infection before: ? Eat more fiber as directed by your health care provider or your diet and nutrition specialist (dietitian). ? Take a fiber supplement or probiotic, if your health care provider approves.  Take over-the-counter and prescription medicines only as told by your health care provider.  If you were prescribed an antibiotic, take it as told by your health care provider. Do not stop taking the antibiotic even if you start to feel better.  Keep all follow-up visits as told by your health care provider. This is important. Contact a health care provider if:  You have pain in your abdomen.  You have bloating.  You have cramps.  You have not had a bowel movement in 3 days. Get help right away if:  Your pain gets worse.  Your bloating becomes very bad.  You have a fever or chills, and your symptoms suddenly get worse.  You vomit.  You have bowel movements that are bloody or black.  You have bleeding from your rectum. Summary  Diverticulosis is a condition that develops when small pouches (diverticula) form in the wall of the large intestine (colon).  You may have a few pouches or many of them.  This condition is most often diagnosed during an exam for other colon problems.  If you have had an infection related to diverticulosis, treatment may include increasing the fiber in your diet, taking supplements, or taking medicines. This information is not intended  to replace advice given to you by your health care provider. Make sure you discuss any questions you have with your health care provider. Document Released: 01/13/2004 Document Revised: 03/06/2016 Document Reviewed: 03/06/2016 Elsevier Interactive Patient Education  2017 [employer].

## 2017-08-08 NOTE — H&P (Signed)
@LOGO @   Primary Care Physician:  Redmond School, MD Primary Gastroenterologist:  Dr. Gala Romney  Pre-Procedure History & Physical: HPI:  Corey Murphy is a 67 y.o. male here for surveillance colonoscopy. History of colonic adenomas removed in 2014.  Past Medical History:  Diagnosis Date  . Alcohol dependency ([place])   . Colonic adenoma 2006  . Essential hypertension, benign   . History of cardiac catheterization    03/2013 LM nl, LAD nl, D1/2/3 small - nl, LCX nondom - nl, OM1/2/3 nl, RCA dom  . Hypercholesteremia   . Hypertensive heart disease   . 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 Nonischemic cardiomyopathy ([facility])    LVEF approximately 40% 06/2013 - improved to normal range  . Obesity   . Type 2 diabetes mellitus ([place])     Past Surgical History:  Procedure Laterality Date  . CIRCUMCISION    . COLONOSCOPY  05/20/2004   RMR: Internal hemorrhoids.  Diminutive adenomatous polyp in the mid-right colon, otherwise normal  . COLONOSCOPY N/A 06/19/2012   Procedure: COLONOSCOPY;  Surgeon: Daneil Dolin, MD;  Location: [facility];  Service: Endoscopy;  Laterality: N/A;  8:30AM-rescheduled 10:30am Darius Bump notified pt  . Ileocolonoscopy  06/28/2007   BWG:[id number] rectum/Submucosal sigmoid diverticula (chronic), doubt clinical significance Hyperplastic polypoid-appearing fold, mid ascending colon, status/Remainder colonic mucosa and terminal ileum mucosa appeared normal   . LEFT AND RIGHT HEART CATHETERIZATION WITH CORONARY ANGIOGRAM  03/19/2013   Procedure: LEFT AND RIGHT HEART CATHETERIZATION WITH CORONARY ANGIOGRAM;  Surgeon: Wellington Hampshire, MD;  Location: [facility];  Service: Cardiovascular;;  . TOTAL HIP ARTHROPLASTY  2010   Left  . TOTAL HIP ARTHROPLASTY  2010   Right    Prior to Admission medications   Medication Sig Start Date End Date Taking? Authorizing Provider  aspirin 81 MG tablet Take 81 mg by mouth daily.   Yes [provider]  carvedilol (COREG)  12.5 MG tablet TAKE 1 TABLET BY MOUTH TWICE A DAY WITH FOOD Patient taking differently: TAKE 12.5 MG BY MOUTH TWICE A DAY WITH FOOD 03/05/17  Yes Satira Sark, MD  furosemide (LASIX) 20 MG tablet TAKE 1 TABLET BY MOUTH EVERY DAY Patient taking differently: Take 20 mg by mouth daily as needed for fluid 02/05/17  Yes Branch, Alphonse Guild, MD  glimepiride (AMARYL) 4 MG tablet Take 4 mg by mouth 2 (two) times daily. 02/20/17  Yes [provider]  HYDROcodone-acetaminophen (NORCO) 10-325 MG tablet Take 1 tablet by mouth every 6 (six) hours as needed for moderate pain.    Yes [provider]  lisinopril (PRINIVIL,ZESTRIL) 2.5 MG tablet Take 2.5 mg by mouth daily.   Yes [provider]  Multiple Vitamins-Iron (MULTIVITAMIN/IRON PO) Take 1 tablet by mouth daily.    Yes [provider]  polyethylene glycol-electrolytes (TRILYTE) 420 g solution Take 4,000 mLs by mouth as directed. 06/07/17  Yes Mahala Menghini, PA-C  simvastatin (ZOCOR) 20 MG tablet Take 20 mg by mouth daily 10/14/15  Yes [provider]  spironolactone (ALDACTONE) 25 MG tablet TAKE HALF A TABLET BY MOUTH DAILY Patient taking differently: TAKE 12.5 BY MOUTH DAILY 11/20/16  Yes Satira Sark, MD  terbinafine (LAMISIL) 250 MG tablet Take 250 mg by mouth daily. 06/21/17  Yes [provider]    Allergies as of 06/08/2017  . (No Known Allergies)    Family History  Problem Relation Age of Onset  . Heart failure Mother   . Diabetes Mother   .  Hypertension Mother   . Heart failure Sister   . Hypertension Sister   . Cancer Sister   . Colon cancer Neg Hx     Social History   Socioeconomic History  . Marital status: Married    Spouse name: Not on file  . Number of children: 2  . Years of education: Not on file  . Highest education level: Not on file  Occupational History  . Occupation: Retired, Fish farm manager  . Financial resource strain: Not on file  . Food  insecurity:    Worry: Not on file    Inability: Not on file  . Transportation needs:    Medical: Not on file    Non-medical: Not on file  Tobacco Use  . Smoking status: Former Smoker    Packs/day: 1.00    Years: 12.00    Pack years: 12.00    Types: Cigarettes    Last attempt to quit: 05/28/2008    Years since quitting: 9.2  . Smokeless tobacco: Never Used  Substance and Sexual Activity  . Alcohol use: Yes    Comment: Occasionally, "Once per week"  . Drug use: Yes    Types: Marijuana    Comment: Smoked via a bowl occassionally.  . Sexual activity: Not on file  Lifestyle  . Physical activity:    Days per week: Not on file    Minutes per session: Not on file  . Stress: Not on file  Relationships  . Social connections:    Talks on phone: Not on file    Gets together: Not on file    Attends religious service: Not on file    Active member of club or organization: Not on file    Attends meetings of clubs or organizations: Not on file    Relationship status: Not on file  . Intimate partner violence:    Fear of current or ex partner: Not on file    Emotionally abused: Not on file    Physically abused: Not on file    Forced sexual activity: Not on file  Other Topics Concern  . Not on file  Social History Narrative  . Not on file    Review of Systems: See HPI, otherwise negative ROS  Physical Exam: BP 130/77   Pulse 69   Temp 98.6 F (37 C) (Oral)   Resp 16   Ht 5\' 8"  (1.727 m)   Wt 197 lb (89.4 kg)   SpO2 100%   BMI 29.95 kg/m  General:   Alert,  Well-developed, well-nourished, pleasant and cooperative in NAD Lungs:  Clear throughout to auscultation.   No wheezes, crackles, or rhonchi. No acute distress. Heart:  Regular rate and rhythm; no murmurs, clicks, rubs,  or gallops. Abdomen: Non-distended, normal bowel sounds.  Soft and nontender without appreciable mass or hepatosplenomegaly.  Pulses:  Normal pulses noted. Extremities:  Without clubbing or  edema.  Impression:  Pleasant 67 year old gentleman with history of colonic adenoma. Here for surveillance examination.  Recommendations:  I have offered the patient a surveillance colonoscopy today.The risks, benefits, limitations, alternatives and imponderables have been reviewed with the patient. Questions have been answered. All parties are agreeable.      Notice: This dictation was prepared with Dragon dictation along with smaller phrase technology. Any transcriptional errors that result from this process are unintentional and may not be corrected upon review.

## 2017-08-08 NOTE — Op Note (Signed)
[facility] Patient Name: Corey Murphy Procedure Date: 08/08/2017 9:34 AM MRN: [id number] Date of Birth: [birth year] Attending MD: Norvel Richards , MD CSN: [id number] Age: 67 Admit Type: Outpatient Procedure:                Colonoscopy Indications:              High risk colon cancer surveillance: Personal                            history of colonic polyps Providers:                Norvel Richards, MD, Lurline Del, RN, Aram Candela Referring MD:              Medicines:                Meperidine 75 mg IV, Midazolam 5 mg IV, Ondansetron                            4 mg IV Complications:            No immediate complications. Estimated Blood Loss:     Estimated blood loss was minimal. Procedure:                Pre-Anesthesia Assessment:                           - Prior to the procedure, a History and Physical                            was performed, and patient medications and                            allergies were reviewed. The patient's tolerance of                            previous anesthesia was also reviewed. The risks                            and benefits of the procedure and the sedation                            options and risks were discussed with the patient.                            All questions were answered, and informed consent                            was obtained. Prior Anticoagulants: The patient has                            taken no previous anticoagulant or antiplatelet  agents. ASA Grade Assessment: II - A patient with                            mild systemic disease. After reviewing the risks                            and benefits, the patient was deemed in                            satisfactory condition to undergo the procedure.                           After obtaining informed consent, the colonoscope                            was passed under direct vision. Throughout the                             procedure, the patient's blood pressure, pulse, and                            oxygen saturations were monitored continuously. The                            EC38-[id number]) scope was introduced through                            the anus and advanced to the the cecum, identified                            by appendiceal orifice and ileocecal valve. The                            ileocecal valve, appendiceal orifice, and rectum                            were photographed. The entire colon was well                            visualized. The patient tolerated the procedure                            well. The quality of the bowel preparation was                            adequate. Scope In: 10:04:17 AM Scope Out: 10:18:26 AM Scope Withdrawal Time: 0 hours 8 minutes 31 seconds  Total Procedure Duration: 0 hours 14 minutes 9 seconds  Findings:      Scattered small and large-mouthed diverticula were found in the sigmoid       colon, descending colon and transverse colon.      Three semi-pedunculated polyps were found in the ascending colon. The       polyps were 4 to 7 mm in size. These polyps were removed with  a cold       snare. Resection and retrieval were complete. Estimated blood loss was       minimal.      A 9 mm polyp was found in the ascending colon. The polyp was       pedunculated. The polyp was removed with a hot snare. Resection and       retrieval were complete. Estimated blood loss: none.      The exam was otherwise without abnormality on direct and retroflexion       views. Impression:               - Diverticulosis in the sigmoid colon, in the                            descending colon and in the transverse colon.                           - Three 4 to 7 mm polyps in the ascending colon,                            removed with a cold snare. Resected and retrieved.                           - One 9 mm polyp in the ascending colon, removed                             with a hot snare. Resected and retrieved.                           - The examination was otherwise normal on direct                            and retroflexion views. Moderate Sedation:      Moderate (conscious) sedation was administered by the endoscopy nurse       and supervised by the endoscopist. The following parameters were       monitored: oxygen saturation, heart rate, blood pressure, respiratory       rate, EKG, adequacy of pulmonary ventilation, and response to care.       Total physician intraservice time was 24 minutes. Recommendation:           - Patient has a contact number available for                            emergencies. The signs and symptoms of potential                            delayed complications were discussed with the                            patient. Return to normal activities tomorrow.                            Written discharge instructions were provided to the  patient.                           - Resume previous diet.                           - Continue present medications.                           - Repeat colonoscopy date to be determined after                            pending pathology results are reviewed for                            surveillance.                           - Return to GI office (date not yet determined). Procedure Code(s):        --- Professional ---                           [phone], Colonoscopy, flexible; with removal of                            tumor(s), polyp(s), or other lesion(s) by snare                            technique                           G0500, Moderate sedation services provided by the                            same physician or other qualified health care                            professional performing a gastrointestinal                            endoscopic service that sedation supports,                            requiring the presence of an  independent trained                            observer to assist in the monitoring of the                            patient's level of consciousness and physiological                            status; initial 15 minutes of intra-service time;                            patient age 656 years or older (additional time 3  be reported with [id number], as appropriate)                           [phone], Moderate sedation services provided by the                            same physician or other qualified health care                            professional performing the diagnostic or                            therapeutic service that the sedation supports,                            requiring the presence of an independent trained                            observer to assist in the monitoring of the                            patient's level of consciousness and physiological                            status; each additional 15 minutes intraservice                            time (List separately in addition to code for                            primary service) Diagnosis Code(s):        --- Professional ---                           D12.2, Benign neoplasm of ascending colon                           Z86.010, Personal history of colonic polyps                           K57.30, Diverticulosis of large intestine without                            perforation or abscess without bleeding CPT copyright 2017 American Medical Association. All rights reserved. The codes documented in this report are preliminary and upon coder review may  be revised to meet current compliance requirements. Cristopher Estimable. Xiomara Sevillano, MD Norvel Richards, MD 08/08/2017 10:23:20 AM This report has been signed electronically. Number of Addenda: 0

## 2017-08-09 ENCOUNTER — Encounter: Payer: Self-pay | Admitting: Internal Medicine

## 2017-08-10 DIAGNOSIS — I129 Hypertensive chronic kidney disease with stage 1 through stage 4 chronic kidney disease, or unspecified chronic kidney disease: Secondary | ICD-10-CM | POA: Diagnosis not present

## 2017-08-10 DIAGNOSIS — N183 Chronic kidney disease, stage 3 (moderate): Secondary | ICD-10-CM | POA: Diagnosis not present

## 2017-08-10 DIAGNOSIS — E1122 Type 2 diabetes mellitus with diabetic chronic kidney disease: Secondary | ICD-10-CM | POA: Diagnosis not present

## 2017-08-10 DIAGNOSIS — R778 Other specified abnormalities of plasma proteins: Secondary | ICD-10-CM | POA: Diagnosis not present

## 2017-08-14 ENCOUNTER — Encounter (HOSPITAL_COMMUNITY): Payer: Self-pay | Admitting: Internal Medicine

## 2017-08-28 NOTE — Progress Notes (Signed)
Cardiology Office Note  Date: 08/29/2017   ID: Brinley Treanor, DOB Dec 12, 1950, MRN [id number]  PCP: Redmond School, MD  Primary Cardiologist: Rozann Lesches, MD   Chief Complaint  Patient presents with  . Cardiomyopathy    History of Present Illness: Corey Murphy is a 67 y.o. male last seen in October 2018.  He presents with his wife today for a follow-up visit.  He reports NYHA class I dyspnea, no exertional chest pain, palpitations, or syncope.  He is exercising 5 days a week at the gym.  I reviewed his medications.  Cardiac regimen is stable as outlined below.  He does not report any other major health changes, is undergoing adjustments in his diabetes regimen per Dr. Gerarda Fraction.  Past Medical History:  Diagnosis Date  . Alcohol dependency ([place])   . Colonic adenoma 2006  . Essential hypertension, benign   . History of cardiac catheterization    03/2013 LM nl, LAD nl, D1/2/3 small - nl, LCX nondom - nl, OM1/2/3 nl, RCA dom  . Hypercholesteremia   . Hypertensive heart disease   . 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 Nonischemic cardiomyopathy ([facility])    LVEF approximately 40% 06/2013 - improved to normal range  . Obesity   . Type 2 diabetes mellitus ([place])     Past Surgical History:  Procedure Laterality Date  . CIRCUMCISION    . COLONOSCOPY  05/20/2004   RMR: Internal hemorrhoids.  Diminutive adenomatous polyp in the mid-right colon, otherwise normal  . COLONOSCOPY N/A 06/19/2012   Procedure: COLONOSCOPY;  Surgeon: Daneil Dolin, MD;  [facility];  Service: Endoscopy;  Laterality: N/A;  8:30AM-rescheduled 10:30am Darius Bump notified pt  . COLONOSCOPY N/A 08/08/2017   Procedure: COLONOSCOPY;  Surgeon: Daneil Dolin, MD;  Location: [facility];  Service: Endoscopy;  Laterality: N/A;  10:30  . Ileocolonoscopy  06/28/2007   EXB:MWUXLK rectum/Submucosal sigmoid diverticula (chronic), doubt clinical significance Hyperplastic polypoid-appearing fold, mid  ascending colon, status/Remainder colonic mucosa and terminal ileum mucosa appeared normal   . LEFT AND RIGHT HEART CATHETERIZATION WITH CORONARY ANGIOGRAM  03/19/2013   Procedure: LEFT AND RIGHT HEART CATHETERIZATION WITH CORONARY ANGIOGRAM;  Surgeon: Wellington Hampshire, MD;  Location: [facility];  Service: Cardiovascular;;  . POLYPECTOMY  08/08/2017   Procedure: POLYPECTOMY;  Surgeon: Daneil Dolin, MD;  Location: [facility];  Service: Endoscopy;;  ascending colon polyps x4 (cold snare-3,hot snare)  . TOTAL HIP ARTHROPLASTY  2010   Left  . TOTAL HIP ARTHROPLASTY  2010   Right    Current Outpatient Medications  Medication Sig Dispense Refill  . aspirin 81 MG tablet Take 81 mg by mouth daily.    . carvedilol (COREG) 12.5 MG tablet TAKE 1 TABLET BY MOUTH TWICE A DAY WITH FOOD (Patient taking differently: TAKE 12.5 MG BY MOUTH TWICE A DAY WITH FOOD) 180 tablet 3  . furosemide (LASIX) 20 MG tablet TAKE 1 TABLET BY MOUTH EVERY DAY (Patient taking differently: Take 20 mg by mouth daily as needed for fluid) 90 tablet 3  . glimepiride (AMARYL) 4 MG tablet Take 4 mg by mouth 2 (two) times daily.  3  . HYDROcodone-acetaminophen (NORCO) 10-325 MG tablet Take 1 tablet by mouth every 6 (six) hours as needed for moderate pain.     Marland Kitchen lisinopril (PRINIVIL,ZESTRIL) 2.5 MG tablet Take 2.5 mg by mouth daily.    . Multiple Vitamins-Iron (MULTIVITAMIN/IRON PO) Take 1 tablet by mouth daily.     Marland Kitchen  simvastatin (ZOCOR) 20 MG tablet Take 20 mg by mouth daily  3  . spironolactone (ALDACTONE) 25 MG tablet TAKE HALF A TABLET BY MOUTH DAILY (Patient taking differently: TAKE 12.5 BY MOUTH DAILY) 45 tablet 6  . terbinafine (LAMISIL) 250 MG tablet Take 250 mg by mouth daily.  2   No current facility-administered medications for this visit.    Allergies:  Patient has no known allergies.   Social History: The patient  reports that he quit smoking about 9 years ago. His smoking use included cigarettes. He has a 12.00  pack-year smoking history. He has never used smokeless tobacco. He reports that he drinks alcohol. He reports that he has current or past drug history. Drug: Marijuana.   ROS:  Please see the history of present illness. Otherwise, complete review of systems is positive for none.  All other systems are reviewed and negative.   Physical Exam: VS:  BP 112/70   Pulse 65   Ht 5\' 8"  (1.727 m)   Wt 203 lb (92.1 kg)   SpO2 97%   BMI 30.87 kg/m , BMI Body mass index is 30.87 kg/m.  Wt Readings from Last 3 Encounters:  08/29/17 203 lb (92.1 kg)  08/08/17 197 lb (89.4 kg)  05/09/17 211 lb 14.4 oz (96.1 kg)    General: Patient appears comfortable at rest. HEENT: Conjunctiva and lids normal, oropharynx clear. Neck: Supple, no elevated JVP or carotid bruits, no thyromegaly. Lungs: Clear to auscultation, nonlabored breathing at rest. Cardiac: Regular rate and rhythm, no S3 or significant systolic murmur, no pericardial rub. Abdomen: Soft, nontender, bowel sounds present. Extremities: No pitting edema, distal pulses 2+.  ECG: I personally reviewed the tracing from 11/30/2016 which showed sinus rhythm with prolonged PR interval and decreased R wave progression.  Recent Labwork: 05/09/2017: ALT 27; AST 19; BUN 21; Creatinine, Ser 1.42; Hemoglobin 12.4; Platelets 254; Potassium 4.0; Sodium 138   Other Studies Reviewed Today:  Echocardiogram 09/06/2016: Study Conclusions  - Left ventricle: The cavity size was normal. Wall thickness was increased in a pattern of moderate LVH. Systolic function was normal. The estimated ejection fraction was in the range of 55% to 60%. Wall motion was normal; there were no regional wall motion abnormalities. Doppler parameters are consistent with abnormal left ventricular relaxation (grade 1 diastolic dysfunction). - Aortic valve: Valve area (VTI): 1.88 cm^2. Valve area (Vmax): 1.76 cm^2. Valve area (Vmean): 1.61 cm^2. - Atrial septum: No defect or  patent foramen ovale was identified. - Technically adequate study.  Assessment and Plan:  1.  History of nonischemic cardiomyopathy.  LVEF has normalized on medical therapy and he is symptomatically quite stable at this point.  Continue exercise plan.  2.  Essential hypertension, blood pressure is adequately controlled today.  Current medicines were reviewed with the patient today.  Disposition: Follow-up in 6 months.  Signed, Satira Sark, MD, [facility] 08/29/2017 10:32 AM    [facility] [place]. [address], [place], [place] [address] Phone: [phone]; Fax: [phone]

## 2017-08-29 ENCOUNTER — Ambulatory Visit: Payer: Medicare HMO | Admitting: Cardiology

## 2017-08-29 ENCOUNTER — Encounter: Payer: Self-pay | Admitting: Cardiology

## 2017-08-29 VITALS — BP 112/70 | HR 65 | Ht 68.0 in | Wt 203.0 lb

## 2017-08-29 DIAGNOSIS — I10 Essential (primary) hypertension: Secondary | ICD-10-CM | POA: Diagnosis not present

## 2017-08-29 DIAGNOSIS — I428 Other cardiomyopathies: Secondary | ICD-10-CM

## 2017-08-29 NOTE — Patient Instructions (Signed)

## 2017-09-12 DIAGNOSIS — R69 Illness, unspecified: Secondary | ICD-10-CM | POA: Diagnosis not present

## 2017-09-22 IMAGING — CR DG BONE SURVEY MET
7 of 10 series · 7 of 10 positions shown · non-contrast
Comparison: None.

CLINICAL DATA: Abnormal serum protein electrophoresis

EXAM:
METASTATIC BONE SURVEY

[w chest pa]
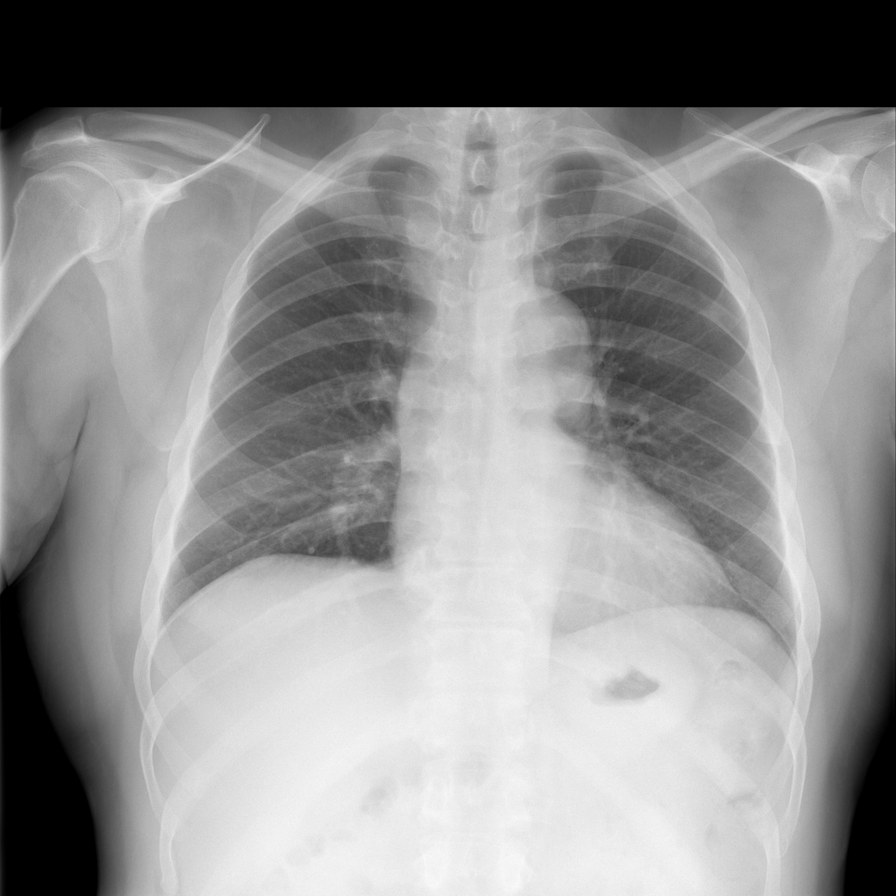

[w c-spine lat]
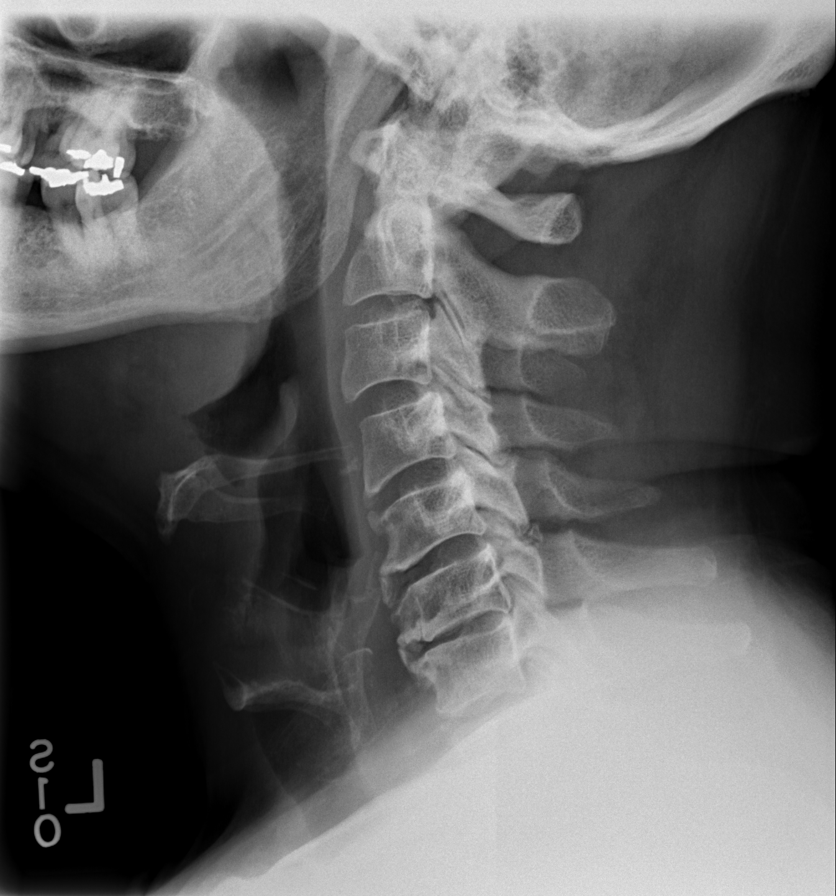

[w c-spine a.p.]
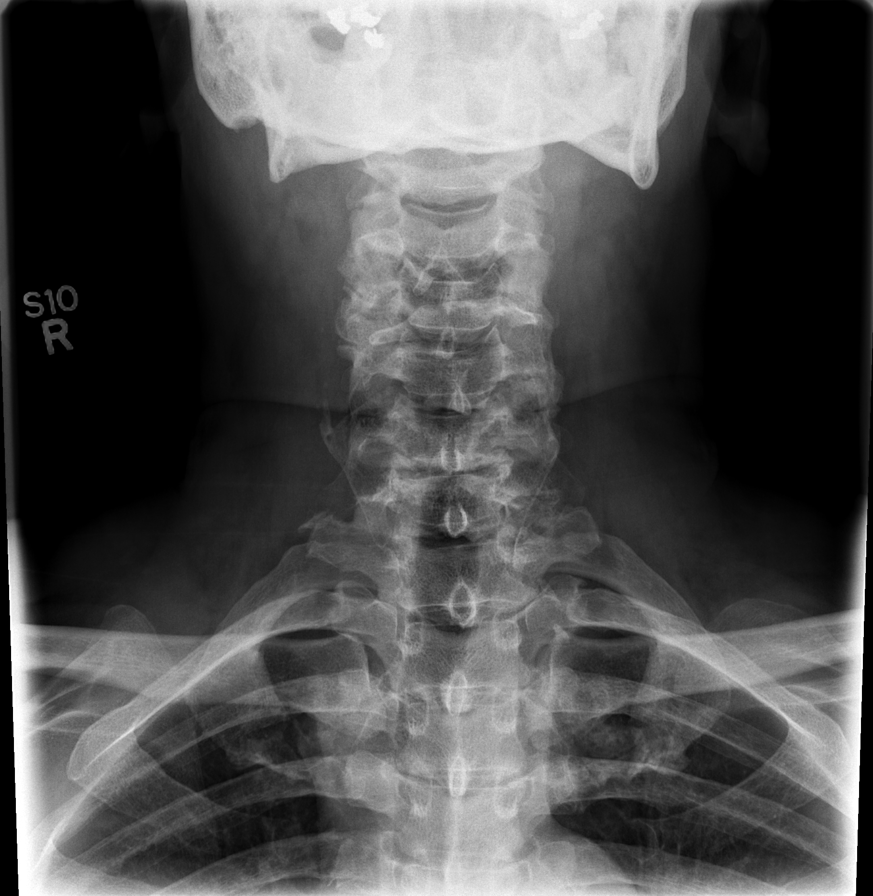

[w skull lat]
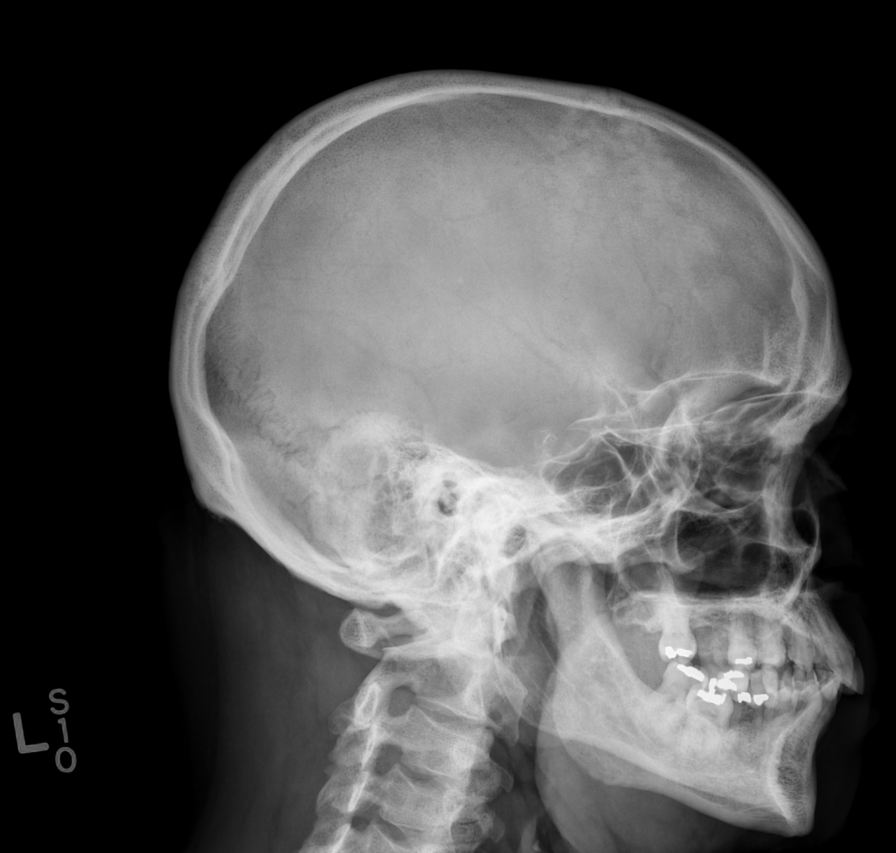

[w shoulder ap external righ]
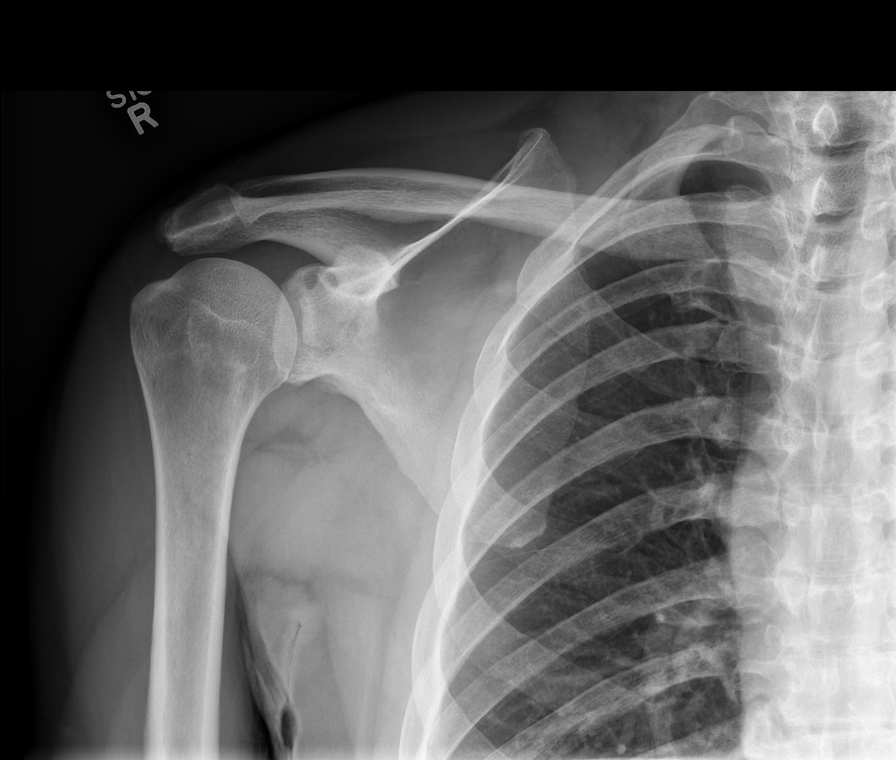

[w shoulder ap external left]
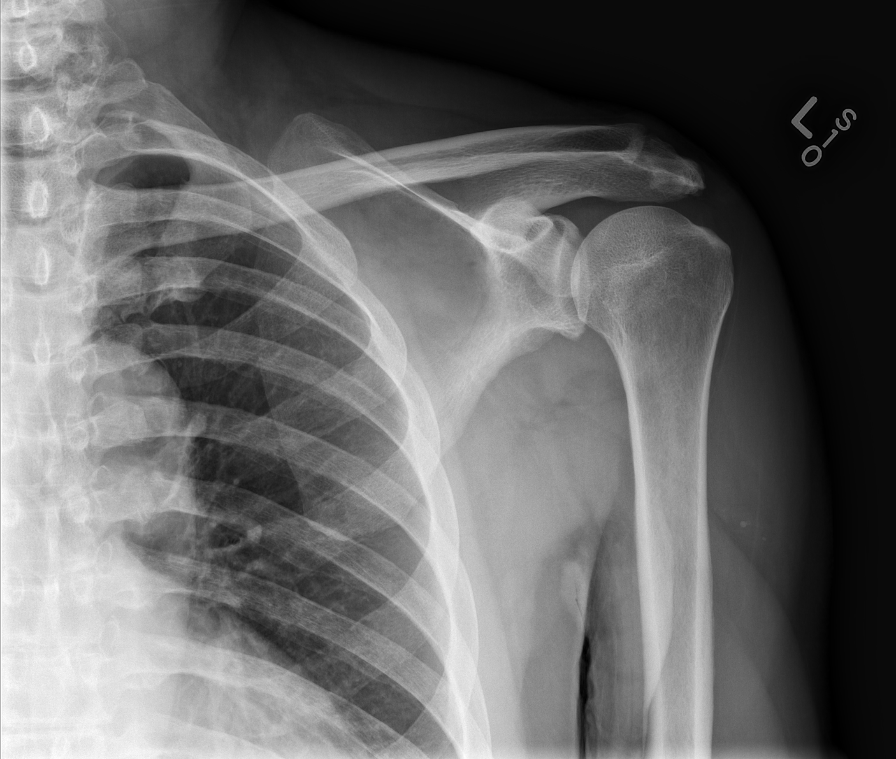

[w humerus ap left]
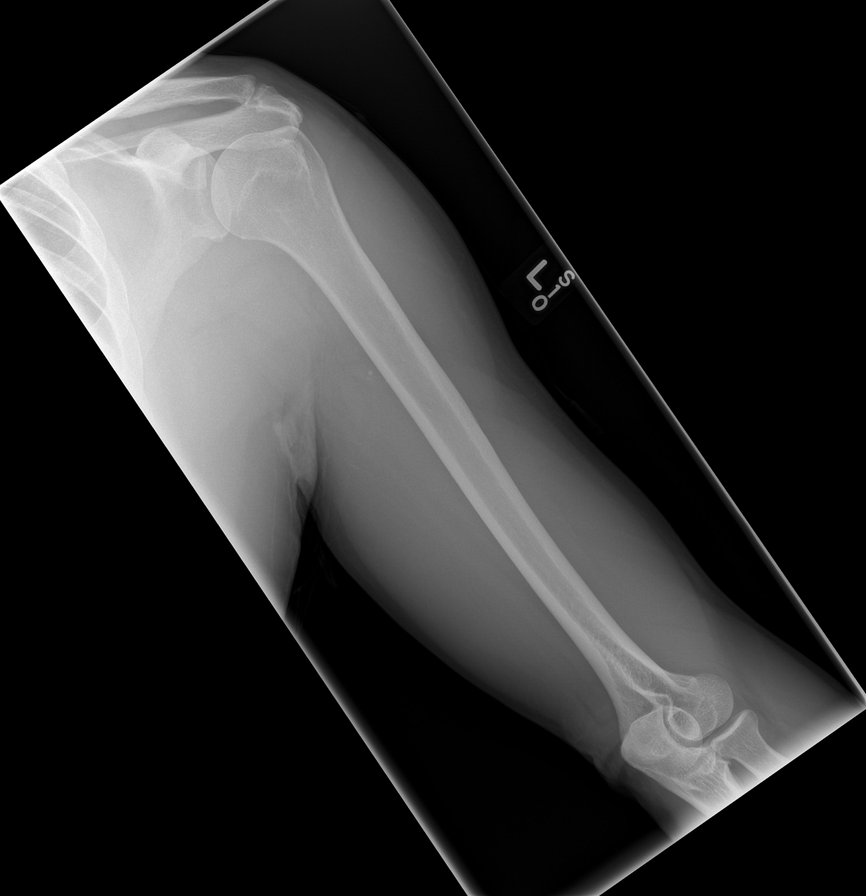

[7 of 10 positions shown; findings below may reference images not displayed]

FINDINGS: A lateral view the skull shows no radiolucent lesions. No bony
abnormality is seen.

Two views of the cervical spine show the cervical vertebrae to be in
normal alignment. There is degenerative disc disease at C5-6 and
C6-7 levels. No compression deformity is seen.

The thoracic vertebrae are in normal alignment with only mild
degenerative spurring. No compression deformity is seen. No
prominent paravertebral soft tissue is noted.

The lumbar vertebrae are in normal alignment with mild degenerative
disc disease diffusely. No compression deformity is seen.

A view of the pelvis shows bilateral total hip replacements to be
present. No complicating features are seen. The pelvic rami are
intact. The SI joints are corticated.

Views of both the right and left shoulder and humerus show
degenerative changes with downsloping acromion which can predispose
to impingement. However no radiolucent lesions are seen.

The lungs are clear. Mediastinal and hilar contours are
unremarkable. The heart is within upper limits of normal.

Views of both the right and left femur, and lower legs show no
radiolucent lesions. No significant degenerative change is noted.

Views of both forearms show no radiolucent lesions and no acute
abnormality.
IMPRESSION: 1. No radiolucent lesions are seen.
2. There are degenerative changes in the cervical, thoracic, and
lumbar spine.
3. No active lung disease.
4. Bilateral downward sloping acromions may result in impingement.
Correlate clinically.

## 2017-09-25 DIAGNOSIS — E1151 Type 2 diabetes mellitus with diabetic peripheral angiopathy without gangrene: Secondary | ICD-10-CM | POA: Diagnosis not present

## 2017-09-25 DIAGNOSIS — E1140 Type 2 diabetes mellitus with diabetic neuropathy, unspecified: Secondary | ICD-10-CM | POA: Diagnosis not present

## 2017-10-16 ENCOUNTER — Other Ambulatory Visit (HOSPITAL_COMMUNITY): Payer: Self-pay | Admitting: *Deleted

## 2017-10-16 DIAGNOSIS — C9000 Multiple myeloma not having achieved remission: Secondary | ICD-10-CM

## 2017-10-16 DIAGNOSIS — D472 Monoclonal gammopathy: Secondary | ICD-10-CM

## 2017-10-17 ENCOUNTER — Inpatient Hospital Stay (HOSPITAL_COMMUNITY): Payer: Medicare HMO | Attending: Hematology

## 2017-10-17 DIAGNOSIS — C9000 Multiple myeloma not having achieved remission: Secondary | ICD-10-CM | POA: Diagnosis not present

## 2017-10-17 DIAGNOSIS — D472 Monoclonal gammopathy: Secondary | ICD-10-CM

## 2017-10-17 LAB — LACTATE DEHYDROGENASE: LDH: 101 U/L (ref 98–192)

## 2017-10-17 LAB — COMPREHENSIVE METABOLIC PANEL
ALT: 29 U/L (ref 17–63)
AST: 17 U/L (ref 15–41)
Albumin: 3.8 g/dL (ref 3.5–5.0)
Alkaline Phosphatase: 87 U/L (ref 38–126)
Anion gap: 7 (ref 5–15)
BILIRUBIN TOTAL: 0.7 mg/dL (ref 0.3–1.2)
BUN: 21 mg/dL — AB (ref 6–20)
CO2: 26 mmol/L (ref 22–32)
CREATININE: 1.43 mg/dL — AB (ref 0.61–1.24)
Calcium: 9 mg/dL (ref 8.9–10.3)
Chloride: 104 mmol/L (ref 101–111)
GFR, EST AFRICAN AMERICAN: 58 mL/min — AB (ref >60)
GFR, EST NON AFRICAN AMERICAN: 50 mL/min — AB (ref >60)
Glucose, Bld: 127 mg/dL — ABNORMAL HIGH (ref 65–99)
Potassium: 4.2 mmol/L (ref 3.5–5.1)
Sodium: 137 mmol/L (ref 135–145)
TOTAL PROTEIN: 8 g/dL (ref 6.5–8.1)

## 2017-10-17 LAB — CBC WITH DIFFERENTIAL/PLATELET
Basophils Absolute: 0 10*3/uL (ref 0.0–0.1)
Basophils Relative: 1 %
EOS PCT: 3 %
Eosinophils Absolute: 0.1 10*3/uL (ref 0.0–0.7)
HEMATOCRIT: 41.8 % (ref 39.0–52.0)
Hemoglobin: 13.8 g/dL (ref 13.0–17.0)
LYMPHS ABS: 2.2 10*3/uL (ref 0.7–4.0)
LYMPHS PCT: 46 %
MCH: 30.7 pg (ref 26.0–34.0)
MCHC: 33 g/dL (ref 30.0–36.0)
MCV: 92.9 fL (ref 78.0–100.0)
MONO ABS: 0.5 10*3/uL (ref 0.1–1.0)
Monocytes Relative: 11 %
NEUTROS ABS: 1.8 10*3/uL (ref 1.7–7.7)
Neutrophils Relative %: 39 %
PLATELETS: 247 10*3/uL (ref 150–400)
RBC: 4.5 MIL/uL (ref 4.22–5.81)
RDW: 14.1 % (ref 11.5–15.5)
WBC: 4.7 10*3/uL (ref 4.0–10.5)

## 2017-10-18 LAB — MULTIPLE MYELOMA PANEL, SERUM
ALBUMIN SERPL ELPH-MCNC: 3.7 g/dL (ref 2.9–4.4)
ALPHA 1: 0.2 g/dL (ref 0.0–0.4)
Albumin/Glob SerPl: 1.1 (ref 0.7–1.7)
Alpha2 Glob SerPl Elph-Mcnc: 0.7 g/dL (ref 0.4–1.0)
B-GLOBULIN SERPL ELPH-MCNC: 1.2 g/dL (ref 0.7–1.3)
GAMMA GLOB SERPL ELPH-MCNC: 1.6 g/dL (ref 0.4–1.8)
GLOBULIN, TOTAL: 3.7 g/dL (ref 2.2–3.9)
IGG (IMMUNOGLOBIN G), SERUM: 1883 mg/dL — AB (ref 700–1600)
IgA: 62 mg/dL (ref 61–437)
IgM (Immunoglobulin M), Srm: 33 mg/dL (ref 20–172)
M PROTEIN SERPL ELPH-MCNC: 1.1 g/dL — AB
TOTAL PROTEIN ELP: 7.4 g/dL (ref 6.0–8.5)

## 2017-10-18 LAB — KAPPA/LAMBDA LIGHT CHAINS
Kappa free light chain: 27.5 mg/L — ABNORMAL HIGH (ref 3.3–19.4)
Kappa, lambda light chain ratio: 0.58 (ref 0.26–1.65)
Lambda free light chains: 47.4 mg/L — ABNORMAL HIGH (ref 5.7–26.3)

## 2017-10-18 LAB — BETA 2 MICROGLOBULIN, SERUM: Beta-2 Microglobulin: 1.9 mg/L (ref 0.6–2.4)

## 2017-10-25 ENCOUNTER — Ambulatory Visit (HOSPITAL_COMMUNITY): Payer: Medicare HMO | Admitting: Hematology

## 2017-10-30 ENCOUNTER — Other Ambulatory Visit (HOSPITAL_COMMUNITY): Payer: Medicare HMO

## 2017-11-04 DIAGNOSIS — R69 Illness, unspecified: Secondary | ICD-10-CM | POA: Diagnosis not present

## 2017-11-06 ENCOUNTER — Ambulatory Visit (HOSPITAL_COMMUNITY): Payer: Medicare HMO

## 2017-11-15 ENCOUNTER — Inpatient Hospital Stay (HOSPITAL_COMMUNITY): Payer: Medicare HMO | Attending: Hematology | Admitting: Hematology

## 2017-11-15 ENCOUNTER — Other Ambulatory Visit: Payer: Self-pay

## 2017-11-15 ENCOUNTER — Encounter (HOSPITAL_COMMUNITY): Payer: Self-pay | Admitting: Hematology

## 2017-11-15 VITALS — BP 140/77 | HR 72 | Temp 98.3°F | Resp 18 | Wt 201.4 lb

## 2017-11-15 DIAGNOSIS — F1210 Cannabis abuse, uncomplicated: Secondary | ICD-10-CM | POA: Insufficient documentation

## 2017-11-15 DIAGNOSIS — Z7984 Long term (current) use of oral hypoglycemic drugs: Secondary | ICD-10-CM | POA: Insufficient documentation

## 2017-11-15 DIAGNOSIS — C9000 Multiple myeloma not having achieved remission: Secondary | ICD-10-CM | POA: Diagnosis not present

## 2017-11-15 DIAGNOSIS — Z7982 Long term (current) use of aspirin: Secondary | ICD-10-CM | POA: Diagnosis not present

## 2017-11-15 DIAGNOSIS — I1310 Hypertensive heart and chronic kidney disease without heart failure, with stage 1 through stage 4 chronic kidney disease, or unspecified chronic kidney disease: Secondary | ICD-10-CM | POA: Diagnosis not present

## 2017-11-15 DIAGNOSIS — E7800 Pure hypercholesterolemia, unspecified: Secondary | ICD-10-CM

## 2017-11-15 DIAGNOSIS — N189 Chronic kidney disease, unspecified: Secondary | ICD-10-CM | POA: Diagnosis not present

## 2017-11-15 DIAGNOSIS — R69 Illness, unspecified: Secondary | ICD-10-CM | POA: Diagnosis not present

## 2017-11-15 DIAGNOSIS — F17210 Nicotine dependence, cigarettes, uncomplicated: Secondary | ICD-10-CM | POA: Insufficient documentation

## 2017-11-15 DIAGNOSIS — I429 Cardiomyopathy, unspecified: Secondary | ICD-10-CM | POA: Diagnosis not present

## 2017-11-15 DIAGNOSIS — Z79899 Other long term (current) drug therapy: Secondary | ICD-10-CM

## 2017-11-15 DIAGNOSIS — Z87891 Personal history of nicotine dependence: Secondary | ICD-10-CM | POA: Insufficient documentation

## 2017-11-15 DIAGNOSIS — E1122 Type 2 diabetes mellitus with diabetic chronic kidney disease: Secondary | ICD-10-CM | POA: Diagnosis not present

## 2017-11-15 DIAGNOSIS — D472 Monoclonal gammopathy: Secondary | ICD-10-CM

## 2017-11-15 NOTE — Patient Instructions (Signed)
[facility] Discharge Instructions  Today you saw Dr. K.   Thank you for choosing [facility] at [facility] to provide your oncology and hematology care.  To afford each patient quality time with our provider, please arrive at least 15 minutes before your scheduled appointment time.   If you have a lab appointment with the Cancer Center please come in thru the  Main Entrance and check in at the main information desk  You need to re-schedule your appointment should you arrive 10 or more minutes late.  We strive to give you quality time with our providers, and arriving late affects you and other patients whose appointments are after yours.  Also, if you no show three or more times for appointments you may be dismissed from the clinic at the providers discretion.     Again, thank you for choosing [facility].  Our hope is that these requests will decrease the amount of time that you wait before being seen by our physicians.       _____________________________________________________________  Should you have questions after your visit to [facility], please contact our office at [phone] between the hours of 8:30 a.m. and 4:30 p.m.  Voicemails left after 4:30 p.m. will not be returned until the following business day.  For prescription refill requests, have your pharmacy contact our office.       Resources For Cancer Patients and their Caregivers ? American Cancer Society: Can assist with transportation, wigs, general needs, runs Look Good Feel Better.        1-888-227-6333 ? Cancer Care: Provides financial assistance, online support groups, medication/co-pay assistance.  1-800-813-HOPE (4673) ? [facility] Assists [place] cancer patients and their families through emotional , educational and financial support.  336-427-4357 ? [place] DSS Where to apply for food  stamps, Medicaid and utility assistance. 336-342-1394 ? [facility]: Transportation to medical appointments. 336-347-2287 ? Social Security Administration: May apply for disability if have a Stage IV cancer. 336-342-7796 1-800-772-1213 ? [place] Aging, Disability and Transit Services: Assists with nutrition, care and transit needs. 336-349-2343  Cancer Center Support Programs:   > Cancer Support Group  2nd Tuesday of the month 1pm-2pm, Journey Room   > Creative Journey  3rd Tuesday of the month 1130am-1pm, Journey Room    

## 2017-11-15 NOTE — Progress Notes (Signed)
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 67 y.o. male returns for routine follow-up smoldering IgG lambda multiple myeloma. Patient is here today with his wife. He is doing well. Denies any B symptoms. Denies any new pains.    REVIEW OF SYSTEMS:  Review of Systems  All other systems reviewed and are negative.    PAST MEDICAL/SURGICAL HISTORY:  Past Medical History:  Diagnosis Date  . Alcohol dependency ([place])   . Colonic adenoma 2006  . Essential hypertension, benign   . History of cardiac catheterization    03/2013 LM nl, LAD nl, D1/2/3 small - nl, LCX nondom - nl, OM1/2/3 nl, RCA dom  . Hypercholesteremia   . Hypertensive heart disease   . 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 Nonischemic cardiomyopathy (HCC)    LVEF approximately 40% 06/2013 - improved to normal range  . Obesity   . Type 2 diabetes mellitus ([place])    Past Surgical History:  Procedure Laterality Date  . CIRCUMCISION    . COLONOSCOPY  05/20/2004   RMR: Internal hemorrhoids.  Diminutive adenomatous polyp in the mid-right colon, otherwise normal  . COLONOSCOPY N/A 06/19/2012   Procedure: COLONOSCOPY;  Surgeon: Daneil Dolin, MD;  [facility];  Service: Endoscopy;  Laterality: N/A;  8:30AM-rescheduled 10:30am Darius Bump notified pt  . COLONOSCOPY N/A 08/08/2017   Procedure: COLONOSCOPY;  Surgeon: Daneil Dolin, MD;  [facility];  Service: Endoscopy;  Laterality: N/A;  10:30  . Ileocolonoscopy  06/28/2007   ENI:DPOEUM rectum/Submucosal sigmoid diverticula (chronic), doubt clinical significance Hyperplastic polypoid-appearing fold, mid ascending colon, status/Remainder colonic mucosa and  terminal ileum mucosa appeared normal   . LEFT AND RIGHT HEART CATHETERIZATION WITH CORONARY ANGIOGRAM  03/19/2013   Procedure: LEFT AND RIGHT HEART CATHETERIZATION WITH CORONARY ANGIOGRAM;  Surgeon: Wellington Hampshire, MD;  Location: [facility];  Service: Cardiovascular;;  . POLYPECTOMY  08/08/2017   Procedure: POLYPECTOMY;  Surgeon: Daneil Dolin, MD;  [facility];  Service: Endoscopy;;  ascending colon polyps x4 (cold snare-3,hot snare)  . TOTAL HIP ARTHROPLASTY  2010   Left  . TOTAL HIP ARTHROPLASTY  2010   Right     SOCIAL HISTORY:  Social History   Socioeconomic History  . Marital status: Married    Spouse name: Not on file  . Number of children: 2  . Years of education: Not on file  . Highest education level: Not on file  Occupational History  . Occupation: Retired, Fish farm manager  . Financial resource strain: Not on file  . Food insecurity:    Worry: Not on file    Inability: Not on file  . Transportation needs:    Medical: Not on file    Non-medical: Not on file  Tobacco Use  . Smoking status: Former Smoker    Packs/day: 1.00    Years: 12.00    Pack years: 12.00    Types: Cigarettes    Last attempt to quit: 05/28/2008    Years since quitting: 9.4  . Smokeless tobacco: Never Used  Substance and Sexual Activity  . Alcohol use: Yes    Comment: Occasionally, "Once per week"  . Drug use: Yes  Types: Marijuana    Comment: Smoked via a bowl occassionally.  . Sexual activity: Not on file  Lifestyle  . Physical activity:    Days per week: Not on file    Minutes per session: Not on file  . Stress: Not on file  Relationships  . Social connections:    Talks on phone: Not on file    Gets together: Not on file    Attends religious service: Not on file    Active member of club or organization: Not on file    Attends meetings of clubs or organizations: Not on file    Relationship status: Not on file  . Intimate partner violence:    Fear  of current or ex partner: Not on file    Emotionally abused: Not on file    Physically abused: Not on file    Forced sexual activity: Not on file  Other Topics Concern  . Not on file  Social History Narrative  . Not on file    FAMILY HISTORY:  Family History  Problem Relation Age of Onset  . Heart failure Mother   . Diabetes Mother   . Hypertension Mother   . Heart failure Sister   . Hypertension Sister   . Cancer Sister   . Colon cancer Neg Hx     CURRENT MEDICATIONS:  Outpatient Encounter Medications as of 11/15/2017  Medication Sig  . aspirin 81 MG tablet Take 81 mg by mouth daily.  . carvedilol (COREG) 12.5 MG tablet TAKE 1 TABLET BY MOUTH TWICE A DAY WITH FOOD (Patient taking differently: TAKE 12.5 MG BY MOUTH TWICE A DAY WITH FOOD)  . furosemide (LASIX) 20 MG tablet TAKE 1 TABLET BY MOUTH EVERY DAY (Patient taking differently: Take 20 mg by mouth daily as needed for fluid)  . glimepiride (AMARYL) 4 MG tablet Take 4 mg by mouth 2 (two) times daily.  Marland Kitchen HYDROcodone-acetaminophen (NORCO) 10-325 MG tablet Take 1 tablet by mouth every 6 (six) hours as needed for moderate pain.   Marland Kitchen lisinopril (PRINIVIL,ZESTRIL) 2.5 MG tablet Take 2.5 mg by mouth daily.  . Multiple Vitamins-Iron (MULTIVITAMIN/IRON PO) Take 1 tablet by mouth daily.   . simvastatin (ZOCOR) 20 MG tablet Take 20 mg by mouth daily  . spironolactone (ALDACTONE) 25 MG tablet TAKE HALF A TABLET BY MOUTH DAILY (Patient taking differently: TAKE 12.5 BY MOUTH DAILY)  . terbinafine (LAMISIL) 250 MG tablet Take 250 mg by mouth daily.   No facility-administered encounter medications on file as of 11/15/2017.     ALLERGIES:  No Known Allergies   PHYSICAL EXAM:  ECOG Performance status: 0  Vitals:   11/15/17 1520  BP: 140/77  Pulse: 72  Resp: 18  Temp: 98.3 F (36.8 C)  SpO2: 99%   Filed Weights   11/15/17 1520  Weight: 201 lb 6.4 oz (91.4 kg)    Physical Exam   LABORATORY DATA:  I have reviewed the labs  as listed.  CBC    Component Value Date/Time   WBC 4.7 10/17/2017 1410   RBC 4.50 10/17/2017 1410   HGB 13.8 10/17/2017 1410   HCT 41.8 10/17/2017 1410   PLT 247 10/17/2017 1410   MCV 92.9 10/17/2017 1410   MCH 30.7 10/17/2017 1410   MCHC 33.0 10/17/2017 1410   RDW 14.1 10/17/2017 1410   LYMPHSABS 2.2 10/17/2017 1410   MONOABS 0.5 10/17/2017 1410   EOSABS 0.1 10/17/2017 1410   BASOSABS 0.0 10/17/2017 1410   CMP Latest Ref  Rng & Units 10/17/2017 05/09/2017 11/06/2016  Glucose 65 - 99 mg/dL 127(H) 152(H) 120(H)  BUN 6 - 20 mg/dL 21(H) 21(H) 24(H)  Creatinine 0.61 - 1.24 mg/dL 1.43(H) 1.42(H) 1.67(H)  Sodium 135 - 145 mmol/L 137 138 137  Potassium 3.5 - 5.1 mmol/L 4.2 4.0 4.7  Chloride 101 - 111 mmol/L 104 107 104  CO2 22 - 32 mmol/L 26 23 26   Calcium 8.9 - 10.3 mg/dL 9.0 9.2 9.3  Total Protein 6.5 - 8.1 g/dL 8.0 7.9 8.2(H)  Total Bilirubin 0.3 - 1.2 mg/dL 0.7 0.2(L) 0.7  Alkaline Phos 38 - 126 U/L 87 84 78  AST 15 - 41 U/L 17 19 25   ALT 17 - 63 U/L 29 27 40       DIAGNOSTIC IMAGING:  I have reviewed his skeletal survey from 05/07/2017.   ASSESSMENT & PLAN:   Smoldering myeloma (HCC) 1.  IgG lambda smoldering multiple myeloma: - Bone marrow biopsy on 07/08/2015 shows normocellular marrow with monoclonal plasmacytosis 10-20%, 46 XY, FISH panel negative - Last M spike of 1.1 g/dL, no "CRAB" features, calcium 9, creatinine 1.43 at baseline, hemoglobin 13.8, free light chain ratio of 0.58 -Last metastatic bone survey on 05/07/2017 did not show any lytic lesions. - His M spike and free light chain ratio has been stable.  His creatinine has also been stable although slightly elevated.  He does not have any new onset bone pains.  Hence we will continue to watch him at 6 monthly intervals.  I plan to repeat another skeletal survey prior to next visit.  2.  CKD: - His creatinine is stable around 1.43.      Orders placed this encounter:  Orders Placed This Encounter  Procedures  .  DG Bone Survey Met  . Protein electrophoresis, serum  . Lactate dehydrogenase, isoenzymes  . Kappa/lambda light chains  . CBC with Differential/Platelet  . Comprehensive metabolic panel      Derek Jack, MD [place] [phone]

## 2017-11-15 NOTE — Assessment & Plan Note (Signed)
1.  IgG lambda smoldering multiple myeloma: - Bone marrow biopsy on 07/08/2015 shows normocellular marrow with monoclonal plasmacytosis 10-20%, 24 XY, FISH panel negative - Last M spike of 1.1 g/dL, no "CRAB" features, calcium 9, creatinine 1.43 at baseline, hemoglobin 13.8, free light chain ratio of 0.58 -Last metastatic bone survey on 05/07/2017 did not show any lytic lesions. - His M spike and free light chain ratio has been stable.  His creatinine has also been stable although slightly elevated.  He does not have any new onset bone pains.  Hence we will continue to watch him at 6 monthly intervals.  I plan to repeat another skeletal survey prior to next visit.  2.  CKD: - His creatinine is stable around 1.43.

## 2017-11-29 DIAGNOSIS — C9001 Multiple myeloma in remission: Secondary | ICD-10-CM | POA: Diagnosis not present

## 2017-11-29 DIAGNOSIS — I10 Essential (primary) hypertension: Secondary | ICD-10-CM | POA: Diagnosis not present

## 2017-11-29 DIAGNOSIS — E7849 Other hyperlipidemia: Secondary | ICD-10-CM | POA: Diagnosis not present

## 2017-11-29 DIAGNOSIS — E6609 Other obesity due to excess calories: Secondary | ICD-10-CM | POA: Diagnosis not present

## 2017-11-29 DIAGNOSIS — I509 Heart failure, unspecified: Secondary | ICD-10-CM | POA: Diagnosis not present

## 2017-11-29 DIAGNOSIS — I2510 Atherosclerotic heart disease of native coronary artery without angina pectoris: Secondary | ICD-10-CM | POA: Diagnosis not present

## 2017-11-29 DIAGNOSIS — E119 Type 2 diabetes mellitus without complications: Secondary | ICD-10-CM | POA: Diagnosis not present

## 2017-11-29 DIAGNOSIS — N183 Chronic kidney disease, stage 3 (moderate): Secondary | ICD-10-CM | POA: Diagnosis not present

## 2017-11-29 DIAGNOSIS — Z1389 Encounter for screening for other disorder: Secondary | ICD-10-CM | POA: Diagnosis not present

## 2017-11-29 DIAGNOSIS — Z6831 Body mass index (BMI) 31.0-31.9, adult: Secondary | ICD-10-CM | POA: Diagnosis not present

## 2017-11-29 DIAGNOSIS — R7309 Other abnormal glucose: Secondary | ICD-10-CM | POA: Diagnosis not present

## 2017-11-29 DIAGNOSIS — E1140 Type 2 diabetes mellitus with diabetic neuropathy, unspecified: Secondary | ICD-10-CM | POA: Diagnosis not present

## 2017-12-12 DIAGNOSIS — M79644 Pain in right finger(s): Secondary | ICD-10-CM | POA: Diagnosis not present

## 2017-12-12 DIAGNOSIS — Z96642 Presence of left artificial hip joint: Secondary | ICD-10-CM | POA: Diagnosis not present

## 2017-12-24 DIAGNOSIS — R69 Illness, unspecified: Secondary | ICD-10-CM | POA: Diagnosis not present

## 2017-12-25 DIAGNOSIS — E1140 Type 2 diabetes mellitus with diabetic neuropathy, unspecified: Secondary | ICD-10-CM | POA: Diagnosis not present

## 2017-12-25 DIAGNOSIS — E1151 Type 2 diabetes mellitus with diabetic peripheral angiopathy without gangrene: Secondary | ICD-10-CM | POA: Diagnosis not present

## 2018-01-14 DIAGNOSIS — Z1389 Encounter for screening for other disorder: Secondary | ICD-10-CM | POA: Diagnosis not present

## 2018-01-14 DIAGNOSIS — E6609 Other obesity due to excess calories: Secondary | ICD-10-CM | POA: Diagnosis not present

## 2018-01-14 DIAGNOSIS — Z6831 Body mass index (BMI) 31.0-31.9, adult: Secondary | ICD-10-CM | POA: Diagnosis not present

## 2018-01-14 DIAGNOSIS — E7849 Other hyperlipidemia: Secondary | ICD-10-CM | POA: Diagnosis not present

## 2018-01-22 DIAGNOSIS — G4700 Insomnia, unspecified: Secondary | ICD-10-CM | POA: Diagnosis not present

## 2018-01-22 DIAGNOSIS — D472 Monoclonal gammopathy: Secondary | ICD-10-CM | POA: Diagnosis not present

## 2018-01-22 DIAGNOSIS — J329 Chronic sinusitis, unspecified: Secondary | ICD-10-CM | POA: Diagnosis not present

## 2018-01-22 DIAGNOSIS — I429 Cardiomyopathy, unspecified: Secondary | ICD-10-CM | POA: Diagnosis not present

## 2018-01-22 DIAGNOSIS — Z0001 Encounter for general adult medical examination with abnormal findings: Secondary | ICD-10-CM | POA: Diagnosis not present

## 2018-01-22 DIAGNOSIS — E663 Overweight: Secondary | ICD-10-CM | POA: Diagnosis not present

## 2018-01-22 DIAGNOSIS — N183 Chronic kidney disease, stage 3 (moderate): Secondary | ICD-10-CM | POA: Diagnosis not present

## 2018-01-22 DIAGNOSIS — C9001 Multiple myeloma in remission: Secondary | ICD-10-CM | POA: Diagnosis not present

## 2018-01-22 DIAGNOSIS — J209 Acute bronchitis, unspecified: Secondary | ICD-10-CM | POA: Diagnosis not present

## 2018-01-22 DIAGNOSIS — Z6830 Body mass index (BMI) 30.0-30.9, adult: Secondary | ICD-10-CM | POA: Diagnosis not present

## 2018-01-31 DIAGNOSIS — J329 Chronic sinusitis, unspecified: Secondary | ICD-10-CM | POA: Diagnosis not present

## 2018-01-31 DIAGNOSIS — I10 Essential (primary) hypertension: Secondary | ICD-10-CM | POA: Diagnosis not present

## 2018-01-31 DIAGNOSIS — Z6830 Body mass index (BMI) 30.0-30.9, adult: Secondary | ICD-10-CM | POA: Diagnosis not present

## 2018-01-31 DIAGNOSIS — E1129 Type 2 diabetes mellitus with other diabetic kidney complication: Secondary | ICD-10-CM | POA: Diagnosis not present

## 2018-01-31 DIAGNOSIS — R05 Cough: Secondary | ICD-10-CM | POA: Diagnosis not present

## 2018-01-31 DIAGNOSIS — B354 Tinea corporis: Secondary | ICD-10-CM | POA: Diagnosis not present

## 2018-01-31 DIAGNOSIS — J209 Acute bronchitis, unspecified: Secondary | ICD-10-CM | POA: Diagnosis not present

## 2018-01-31 DIAGNOSIS — R69 Illness, unspecified: Secondary | ICD-10-CM | POA: Diagnosis not present

## 2018-02-01 ENCOUNTER — Other Ambulatory Visit: Payer: Self-pay | Admitting: Cardiology

## 2018-02-13 DIAGNOSIS — R69 Illness, unspecified: Secondary | ICD-10-CM | POA: Diagnosis not present

## 2018-02-17 ENCOUNTER — Other Ambulatory Visit: Payer: Self-pay | Admitting: Cardiology

## 2018-02-26 ENCOUNTER — Ambulatory Visit: Payer: Medicare HMO | Admitting: Cardiology

## 2018-02-26 ENCOUNTER — Encounter: Payer: Self-pay | Admitting: Cardiology

## 2018-02-26 VITALS — BP 102/58 | HR 73 | Ht 68.0 in | Wt 181.0 lb

## 2018-02-26 DIAGNOSIS — I10 Essential (primary) hypertension: Secondary | ICD-10-CM | POA: Diagnosis not present

## 2018-02-26 DIAGNOSIS — Z23 Encounter for immunization: Secondary | ICD-10-CM | POA: Diagnosis not present

## 2018-02-26 DIAGNOSIS — I428 Other cardiomyopathies: Secondary | ICD-10-CM | POA: Diagnosis not present

## 2018-02-26 NOTE — Patient Instructions (Signed)
Medication Instructions:  Your physician recommends that you continue on your current medications as directed. Please refer to the Current Medication list given to you today.  If you need a refill on your cardiac medications before your next appointment, please call your pharmacy.   Lab work: NONE If you have labs (blood work) drawn today and your tests are completely normal, you will receive your results only by: Marland Kitchen MyChart Message (if you have MyChart) OR . A paper copy in the mail If you have any lab test that is abnormal or we need to change your treatment, we will call you to review the results.  Testing/Procedures: Your physician has requested that you have an echocardiogram JUST BEFORE NEXT VISIT IN 6 MONTHS. Echocardiography is a painless test that uses sound waves to create images of your heart. It provides your doctor with information about the size and shape of your heart and how well your heart's chambers and valves are working. This procedure takes approximately one hour. There are no restrictions for this procedure.    Follow-Up: At [facility], you and your health needs are our priority.  As part of our continuing mission to provide you with exceptional heart care, we have created designated Provider Care Teams.  These Care Teams include your primary Cardiologist (physician) and Advanced Practice Providers (APPs -  Physician Assistants and Nurse Practitioners) who all work together to provide you with the care you need, when you need it. You will need a follow up appointment in 6 months.  Please call our office 2 months in advance to schedule this appointment.  You may see Rozann Lesches, MD or one of the following Advanced Practice Providers on your designated Care Team:   Bernerd Pho, PA-C [facility]) . Ermalinda Barrios, PA-C ([place])  Any Other Special Instructions Will Be Listed Below (If Applicable). NONE

## 2018-02-26 NOTE — Progress Notes (Signed)
Cardiology Office Note  Date: 02/26/2018   ID: Corey Murphy, DOB 10/28/1950, MRN [id number]  PCP: Redmond School, MD  Primary Cardiologist: Rozann Lesches, MD   Chief Complaint  Patient presents with  . Cardiomyopathy    History of Present Illness: Corey Murphy is a 67 y.o. male last seen in May.  He is here today with his wife for a follow-up visit.  He tells me that back in August he was having trouble with shortness of breath and cough, decided to focus on his diet and lost weight.  He felt better but ultimately ended up being treated for bronchitis with improvement in symptoms.  He is still exercising at the gym regularly.  There have been no significant changes in his cardiac regimen, outlined below.  Last echocardiogram was in May 2018.  I personally reviewed his ECG today which shows sinus rhythm with decreased R wave progression.  Past Medical History:  Diagnosis Date  . Alcohol dependency ([place])   . Colonic adenoma 2006  . Essential hypertension, benign   . History of cardiac catheterization    03/2013 LM nl, LAD nl, D1/2/3 small - nl, LCX nondom - nl, OM1/2/3 nl, RCA dom  . Hypercholesteremia   . Hypertensive heart disease   . 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 Nonischemic cardiomyopathy (HCC)    LVEF approximately 40% 06/2013 - improved to normal range  . Obesity   . Type 2 diabetes mellitus ([place])     Past Surgical History:  Procedure Laterality Date  . CIRCUMCISION    . COLONOSCOPY  05/20/2004   RMR: Internal hemorrhoids.  Diminutive adenomatous polyp in the mid-right colon, otherwise normal  . COLONOSCOPY N/A 06/19/2012   Procedure: COLONOSCOPY;  Surgeon: Daneil Dolin, MD;  [facility];  Service: Endoscopy;  Laterality: N/A;  8:30AM-rescheduled 10:30am Darius Bump notified pt  . COLONOSCOPY N/A 08/08/2017   Procedure: COLONOSCOPY;  Surgeon: Daneil Dolin, MD;  [facility];  Service: Endoscopy;  Laterality: N/A;  10:30    . Ileocolonoscopy  06/28/2007   YQM:VHQION rectum/Submucosal sigmoid diverticula (chronic), doubt clinical significance Hyperplastic polypoid-appearing fold, mid ascending colon, status/Remainder colonic mucosa and terminal ileum mucosa appeared normal   . LEFT AND RIGHT HEART CATHETERIZATION WITH CORONARY ANGIOGRAM  03/19/2013   Procedure: LEFT AND RIGHT HEART CATHETERIZATION WITH CORONARY ANGIOGRAM;  Surgeon: Wellington Hampshire, MD;  Location: [facility];  Service: Cardiovascular;;  . POLYPECTOMY  08/08/2017   Procedure: POLYPECTOMY;  Surgeon: Daneil Dolin, MD;  [facility];  Service: Endoscopy;;  ascending colon polyps x4 (cold snare-3,hot snare)  . TOTAL HIP ARTHROPLASTY  2010   Left  . TOTAL HIP ARTHROPLASTY  2010   Right    Current Outpatient Medications  Medication Sig Dispense Refill  . aspirin 81 MG tablet Take 81 mg by mouth daily.    . carvedilol (COREG) 12.5 MG tablet TAKE 1 TABLET BY MOUTH TWICE A DAY WITH FOOD (Patient taking differently: TAKE 12.5 MG BY MOUTH TWICE A DAY WITH FOOD) 180 tablet 3  . furosemide (LASIX) 20 MG tablet TAKE 1 TABLET BY MOUTH EVERY DAY 90 tablet 3  . glimepiride (AMARYL) 2 MG tablet Take 2 mg by mouth daily with breakfast.    . HYDROcodone-acetaminophen (NORCO) 10-325 MG tablet Take 1 tablet by mouth every 6 (six) hours as needed for moderate pain.     Marland Kitchen lisinopril (PRINIVIL,ZESTRIL) 2.5 MG tablet Take 2.5 mg by mouth daily.    Marland Kitchen  Multiple Vitamins-Iron (MULTIVITAMIN/IRON PO) Take 1 tablet by mouth daily.     . simvastatin (ZOCOR) 20 MG tablet Take 20 mg by mouth daily  3  . spironolactone (ALDACTONE) 25 MG tablet TAKE 12.5 BY MOUTH DAILY 45 tablet 6  . terbinafine (LAMISIL) 250 MG tablet Take 250 mg by mouth daily.  2   No current facility-administered medications for this visit.    Allergies:  Patient has no known allergies.   Social History: The patient  reports that he quit smoking about 9 years ago. His smoking use included  cigarettes. He has a 12.00 pack-year smoking history. He has never used smokeless tobacco. He reports that he drinks alcohol. He reports that he has current or past drug history. Drug: Marijuana.   ROS:  Please see the history of present illness. Otherwise, complete review of systems is positive for none.  All other systems are reviewed and negative.   Physical Exam: VS:  BP (!) 102/58   Pulse 73   Ht 5\' 8"  (1.727 m)   Wt 181 lb (82.1 kg)   SpO2 97%   BMI 27.52 kg/m , BMI Body mass index is 27.52 kg/m.  Wt Readings from Last 3 Encounters:  02/26/18 181 lb (82.1 kg)  11/15/17 201 lb 6.4 oz (91.4 kg)  08/29/17 203 lb (92.1 kg)    General: Patient appears comfortable at rest. HEENT: Conjunctiva and lids normal, oropharynx clear. Neck: Supple, no elevated JVP or carotid bruits, no thyromegaly. Lungs: Clear to auscultation, nonlabored breathing at rest. Cardiac: Regular rate and rhythm, no S3 or significant systolic murmur. Abdomen: Soft, nontender, bowel sounds present. Extremities: No pitting edema, distal pulses 2+.  ECG: I personally reviewed the tracing from 11/30/2016 which shows sinus rhythm with prolonged PR interval and poor R wave progression.  Recent Labwork: 10/17/2017: ALT 29; AST 17; BUN 21; Creatinine, Ser 1.43; Hemoglobin 13.8; Platelets 247; Potassium 4.2; Sodium 137   Other Studies Reviewed Today:  Echocardiogram 09/06/2016: Study Conclusions  - Left ventricle: The cavity size was normal. Wall thickness was increased in a pattern of moderate LVH. Systolic function was normal. The estimated ejection fraction was in the range of 55% to 60%. Wall motion was normal; there were no regional wall motion abnormalities. Doppler parameters are consistent with abnormal left ventricular relaxation (grade 1 diastolic dysfunction). - Aortic valve: Valve area (VTI): 1.88 cm^2. Valve area (Vmax): 1.76 cm^2. Valve area (Vmean): 1.61 cm^2. - Atrial septum: No defect  or patent foramen ovale was identified. - Technically adequate study.  Assessment and Plan:  1.  History of nonischemic cardiomyopathy with normalization of LVEF.  Last assessment by echocardiogram was in May 2018.  Continue with current medications and plan follow-up echocardiogram for his next visit in 6 months.  2.  Essential hypertension, blood pressure is well controlled today.  Current medicines were reviewed with the patient today.   Orders Placed This Encounter  Procedures  . EKG 12-Lead  . ECHOCARDIOGRAM COMPLETE    Disposition: Follow-up in 6 months.  Signed, Satira Sark, MD, [facility] 02/26/2018 2:34 PM    [place] [facility] [address], [place], [place] [address] Phone: [phone]; Fax: [phone]

## 2018-03-12 ENCOUNTER — Other Ambulatory Visit: Payer: Self-pay | Admitting: *Deleted

## 2018-03-12 DIAGNOSIS — E1140 Type 2 diabetes mellitus with diabetic neuropathy, unspecified: Secondary | ICD-10-CM | POA: Diagnosis not present

## 2018-03-12 DIAGNOSIS — E1151 Type 2 diabetes mellitus with diabetic peripheral angiopathy without gangrene: Secondary | ICD-10-CM | POA: Diagnosis not present

## 2018-03-12 MED ORDER — CARVEDILOL 12.5 MG PO TABS: 12.5000 mg | ORAL_TABLET | Freq: Two times a day (BID) | ORAL | 3 refills | Status: DC

## 2018-04-02 DIAGNOSIS — R69 Illness, unspecified: Secondary | ICD-10-CM | POA: Diagnosis not present

## 2018-05-16 ENCOUNTER — Inpatient Hospital Stay (HOSPITAL_COMMUNITY): Payer: Medicare HMO | Attending: Hematology

## 2018-05-16 ENCOUNTER — Ambulatory Visit (HOSPITAL_COMMUNITY)
Admission: RE | Admit: 2018-05-16 | Discharge: 2018-05-16 | Disposition: A | Discharge status: Home / Self Care | Payer: Medicare HMO | Source: Ambulatory Visit | Attending: Nurse Practitioner | Admitting: Nurse Practitioner

## 2018-05-16 DIAGNOSIS — E1122 Type 2 diabetes mellitus with diabetic chronic kidney disease: Secondary | ICD-10-CM | POA: Diagnosis not present

## 2018-05-16 DIAGNOSIS — E669 Obesity, unspecified: Secondary | ICD-10-CM | POA: Insufficient documentation

## 2018-05-16 DIAGNOSIS — E7800 Pure hypercholesterolemia, unspecified: Secondary | ICD-10-CM | POA: Diagnosis not present

## 2018-05-16 DIAGNOSIS — Z7982 Long term (current) use of aspirin: Secondary | ICD-10-CM | POA: Insufficient documentation

## 2018-05-16 DIAGNOSIS — Z79899 Other long term (current) drug therapy: Secondary | ICD-10-CM | POA: Diagnosis not present

## 2018-05-16 DIAGNOSIS — Z87891 Personal history of nicotine dependence: Secondary | ICD-10-CM | POA: Insufficient documentation

## 2018-05-16 DIAGNOSIS — I1310 Hypertensive heart and chronic kidney disease without heart failure, with stage 1 through stage 4 chronic kidney disease, or unspecified chronic kidney disease: Secondary | ICD-10-CM | POA: Insufficient documentation

## 2018-05-16 DIAGNOSIS — N189 Chronic kidney disease, unspecified: Secondary | ICD-10-CM | POA: Insufficient documentation

## 2018-05-16 DIAGNOSIS — M7710 Lateral epicondylitis, unspecified elbow: Secondary | ICD-10-CM | POA: Diagnosis not present

## 2018-05-16 DIAGNOSIS — Z7984 Long term (current) use of oral hypoglycemic drugs: Secondary | ICD-10-CM | POA: Insufficient documentation

## 2018-05-16 DIAGNOSIS — D472 Monoclonal gammopathy: Secondary | ICD-10-CM

## 2018-05-16 DIAGNOSIS — C9000 Multiple myeloma not having achieved remission: Secondary | ICD-10-CM | POA: Insufficient documentation

## 2018-05-16 LAB — CBC WITH DIFFERENTIAL/PLATELET
ABS IMMATURE GRANULOCYTES: 0 10*3/uL (ref 0.00–0.07)
BASOS ABS: 0 10*3/uL (ref 0.0–0.1)
Basophils Relative: 1 %
EOS ABS: 0.1 10*3/uL (ref 0.0–0.5)
Eosinophils Relative: 2 %
HEMATOCRIT: 41.3 % (ref 39.0–52.0)
HEMOGLOBIN: 13.2 g/dL (ref 13.0–17.0)
IMMATURE GRANULOCYTES: 0 %
LYMPHS ABS: 2.6 10*3/uL (ref 0.7–4.0)
Lymphocytes Relative: 45 %
MCH: 30.8 pg (ref 26.0–34.0)
MCHC: 32 g/dL (ref 30.0–36.0)
MCV: 96.5 fL (ref 80.0–100.0)
MONOS PCT: 10 %
Monocytes Absolute: 0.6 10*3/uL (ref 0.1–1.0)
NEUTROS PCT: 42 %
NRBC: 0 % (ref 0.0–0.2)
Neutro Abs: 2.4 10*3/uL (ref 1.7–7.7)
Platelets: 262 10*3/uL (ref 150–400)
RBC: 4.28 MIL/uL (ref 4.22–5.81)
RDW: 14 % (ref 11.5–15.5)
WBC: 5.7 10*3/uL (ref 4.0–10.5)

## 2018-05-16 LAB — COMPREHENSIVE METABOLIC PANEL
ALBUMIN: 3.9 g/dL (ref 3.5–5.0)
ALT: 26 U/L (ref 0–44)
AST: 19 U/L (ref 15–41)
Alkaline Phosphatase: 75 U/L (ref 38–126)
Anion gap: 9 (ref 5–15)
BILIRUBIN TOTAL: 0.2 mg/dL — AB (ref 0.3–1.2)
BUN: 23 mg/dL (ref 8–23)
CO2: 24 mmol/L (ref 22–32)
CREATININE: 1.48 mg/dL — AB (ref 0.61–1.24)
Calcium: 9.1 mg/dL (ref 8.9–10.3)
Chloride: 104 mmol/L (ref 98–111)
GFR calc Af Amer: 57 mL/min — ABNORMAL LOW (ref >60)
GFR calc non Af Amer: 49 mL/min — ABNORMAL LOW (ref >60)
GLUCOSE: 132 mg/dL — AB (ref 70–99)
POTASSIUM: 4.3 mmol/L (ref 3.5–5.1)
Sodium: 137 mmol/L (ref 135–145)
TOTAL PROTEIN: 7.9 g/dL (ref 6.5–8.1)

## 2018-05-17 LAB — PROTEIN ELECTROPHORESIS, SERUM
A/G RATIO SPE: 1 (ref 0.7–1.7)
ALBUMIN ELP: 3.8 g/dL (ref 2.9–4.4)
ALPHA-1-GLOBULIN: 0.2 g/dL (ref 0.0–0.4)
ALPHA-2-GLOBULIN: 0.7 g/dL (ref 0.4–1.0)
Beta Globulin: 1.2 g/dL (ref 0.7–1.3)
GLOBULIN, TOTAL: 3.7 g/dL (ref 2.2–3.9)
Gamma Globulin: 1.6 g/dL (ref 0.4–1.8)
M-Spike, %: 1.3 g/dL — ABNORMAL HIGH
TOTAL PROTEIN ELP: 7.5 g/dL (ref 6.0–8.5)

## 2018-05-17 LAB — KAPPA/LAMBDA LIGHT CHAINS
KAPPA FREE LGHT CHN: 25.4 mg/L — AB (ref 3.3–19.4)
KAPPA, LAMDA LIGHT CHAIN RATIO: 0.44 (ref 0.26–1.65)
LAMDA FREE LIGHT CHAINS: 58.3 mg/L — AB (ref 5.7–26.3)

## 2018-05-21 DIAGNOSIS — R69 Illness, unspecified: Secondary | ICD-10-CM | POA: Diagnosis not present

## 2018-05-21 DIAGNOSIS — E1140 Type 2 diabetes mellitus with diabetic neuropathy, unspecified: Secondary | ICD-10-CM | POA: Diagnosis not present

## 2018-05-21 DIAGNOSIS — E1151 Type 2 diabetes mellitus with diabetic peripheral angiopathy without gangrene: Secondary | ICD-10-CM | POA: Diagnosis not present

## 2018-05-22 DIAGNOSIS — M7711 Lateral epicondylitis, right elbow: Secondary | ICD-10-CM | POA: Diagnosis not present

## 2018-05-22 DIAGNOSIS — E6609 Other obesity due to excess calories: Secondary | ICD-10-CM | POA: Diagnosis not present

## 2018-05-22 DIAGNOSIS — Z6830 Body mass index (BMI) 30.0-30.9, adult: Secondary | ICD-10-CM | POA: Diagnosis not present

## 2018-05-23 ENCOUNTER — Inpatient Hospital Stay (HOSPITAL_COMMUNITY): Payer: Medicare HMO | Admitting: Hematology

## 2018-05-23 ENCOUNTER — Other Ambulatory Visit: Payer: Self-pay

## 2018-05-23 ENCOUNTER — Encounter (HOSPITAL_COMMUNITY): Payer: Self-pay | Admitting: Hematology

## 2018-05-23 VITALS — BP 117/70 | HR 67 | Temp 98.8°F | Resp 16 | Wt 189.0 lb

## 2018-05-23 DIAGNOSIS — E7800 Pure hypercholesterolemia, unspecified: Secondary | ICD-10-CM

## 2018-05-23 DIAGNOSIS — N189 Chronic kidney disease, unspecified: Secondary | ICD-10-CM | POA: Diagnosis not present

## 2018-05-23 DIAGNOSIS — Z7982 Long term (current) use of aspirin: Secondary | ICD-10-CM

## 2018-05-23 DIAGNOSIS — E669 Obesity, unspecified: Secondary | ICD-10-CM

## 2018-05-23 DIAGNOSIS — C9000 Multiple myeloma not having achieved remission: Secondary | ICD-10-CM | POA: Diagnosis not present

## 2018-05-23 DIAGNOSIS — I1310 Hypertensive heart and chronic kidney disease without heart failure, with stage 1 through stage 4 chronic kidney disease, or unspecified chronic kidney disease: Secondary | ICD-10-CM | POA: Diagnosis not present

## 2018-05-23 DIAGNOSIS — E1122 Type 2 diabetes mellitus with diabetic chronic kidney disease: Secondary | ICD-10-CM

## 2018-05-23 DIAGNOSIS — M7710 Lateral epicondylitis, unspecified elbow: Secondary | ICD-10-CM | POA: Diagnosis not present

## 2018-05-23 DIAGNOSIS — Z87891 Personal history of nicotine dependence: Secondary | ICD-10-CM

## 2018-05-23 DIAGNOSIS — Z79899 Other long term (current) drug therapy: Secondary | ICD-10-CM

## 2018-05-23 DIAGNOSIS — Z7984 Long term (current) use of oral hypoglycemic drugs: Secondary | ICD-10-CM | POA: Diagnosis not present

## 2018-05-23 DIAGNOSIS — D472 Monoclonal gammopathy: Secondary | ICD-10-CM

## 2018-05-23 LAB — LACTATE DEHYDROGENASE, ISOENZYMES
LDH 1: 26 % (ref 17–32)
LDH 2: 29 % (ref 25–40)
LDH 3: 24 % (ref 17–27)
LDH 4: 10 % (ref 5–13)
LDH 5: 11 % (ref 4–20)
LDH Isoenzymes, Total: 111 IU/L — ABNORMAL LOW (ref 121–224)

## 2018-05-23 NOTE — Patient Instructions (Signed)
[facility] at [facility] Discharge Instructions     Thank you for choosing [facility] at [facility] to provide your oncology and hematology care.  To afford each patient quality time with our provider, please arrive at least 15 minutes before your scheduled appointment time.   If you have a lab appointment with the Cancer Center please come in thru the  Main Entrance and check in at the main information desk  You need to re-schedule your appointment should you arrive 10 or more minutes late.  We strive to give you quality time with our providers, and arriving late affects you and other patients whose appointments are after yours.  Also, if you no show three or more times for appointments you may be dismissed from the clinic at the providers discretion.     Again, thank you for choosing [facility].  Our hope is that these requests will decrease the amount of time that you wait before being seen by our physicians.       _____________________________________________________________  Should you have questions after your visit to  Cancer Center, please contact our office at [phone] between the hours of 8:00 a.m. and 4:30 p.m.  Voicemails left after 4:00 p.m. will not be returned until the following business day.  For prescription refill requests, have your pharmacy contact our office and allow 72 hours.    Cancer Center Support Programs:   > Cancer Support Group  2nd Tuesday of the month 1pm-2pm, Journey Room    

## 2018-05-23 NOTE — Assessment & Plan Note (Signed)
1.  IgG lambda smoldering multiple myeloma: - Bone marrow biopsy on 07/08/2015 shows normocellular marrow with monoclonal plasmacytosis 10-20%, 46 XY, FISH panel negative - We discussed his blood work dated 05/16/2018, M spike was 1.3.  Free light chain ratio was normal.  Lambda light chains were 58.3.  Hemoglobin was normal.  Calcium was also normal. - I have reviewed results of the skeletal survey dated 05/16/2018 which did not show any lytic lesions. -he does not have any recurrent infections. -We will see him back in 6 months for follow-up.  We will plan to repeat blood work prior to next visit.  2.  CKD: - Creatinine is stable around 1.4.

## 2018-05-23 NOTE — Progress Notes (Signed)
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 Beem 68 y.o. 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 He is here today with his wife. He is doing well and has been working out and trying to loose weight. His Cardiologist wanted him to loose it. He has lost total 15 pounds. He does have tennis elbow from lighting weights but denies any other bone or joint pains. Denies any nausea, vomiting, or diarrhea. Denies any new pains. Had not noticed any recent bleeding such as epistaxis, hematuria or hematochezia. Denies recent chest pain on exertion, shortness of breath on minimal exertion, pre-syncopal episodes, or palpitations. Denies any numbness or tingling in hands or feet. Denies any recent fevers, infections, or recent hospitalizations. Patient reports appetite at 100% and energy level at 100%.   REVIEW OF SYSTEMS:  Review of Systems  All other systems reviewed and are negative.    PAST MEDICAL/SURGICAL HISTORY:  Past Medical History:  Diagnosis Date  . Alcohol dependency ([place])   . Colonic adenoma 2006  . Essential hypertension, benign   . History of cardiac catheterization    03/2013 LM nl, LAD nl, D1/2/3 small - nl, LCX nondom - nl, OM1/2/3 nl, RCA dom  . Hypercholesteremia   . Hypertensive heart disease   . 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 Nonischemic cardiomyopathy (HCC)    LVEF approximately 40% 06/2013 - improved to normal range  . Obesity   . Type 2 diabetes mellitus ([place])    Past Surgical History:  Procedure Laterality Date  . CIRCUMCISION    . COLONOSCOPY  05/20/2004   RMR: Internal hemorrhoids.  Diminutive adenomatous polyp in the mid-right  colon, otherwise normal  . COLONOSCOPY N/A 06/19/2012   Procedure: COLONOSCOPY;  Surgeon: Daneil Dolin, MD;  [facility];  Service: Endoscopy;  Laterality: N/A;  8:30AM-rescheduled 10:30am Darius Bump notified pt  . COLONOSCOPY N/A 08/08/2017   Procedure: COLONOSCOPY;  Surgeon: Daneil Dolin, MD;  [facility];  Service: Endoscopy;  Laterality: N/A;  10:30  . Ileocolonoscopy  06/28/2007   CWC:BJSEGB rectum/Submucosal sigmoid diverticula (chronic), doubt clinical significance Hyperplastic polypoid-appearing fold, mid ascending colon, status/Remainder colonic mucosa and terminal ileum mucosa appeared normal   . LEFT AND RIGHT HEART CATHETERIZATION WITH CORONARY ANGIOGRAM  03/19/2013   Procedure: LEFT AND RIGHT HEART CATHETERIZATION WITH CORONARY ANGIOGRAM;  Surgeon: Wellington Hampshire, MD;  Location: [facility];  Service: Cardiovascular;;  . POLYPECTOMY  08/08/2017   Procedure: POLYPECTOMY;  Surgeon: Daneil Dolin, MD;  [facility];  Service: Endoscopy;;  ascending colon polyps x4 (cold snare-3,hot snare)  . TOTAL HIP ARTHROPLASTY  2010   Left  . TOTAL HIP ARTHROPLASTY  2010   Right     SOCIAL HISTORY:  Social History   Socioeconomic History  . Marital status: Married    Spouse name: Not on file  . Number of children: 2  . Years of education: Not on file  . Highest education level: Not on file  Occupational History  . Occupation: Retired, Fish farm manager  . Financial resource strain: Not on file  . Food insecurity:    Worry: Not on file    Inability:  Not on file  . Transportation needs:    Medical: Not on file    Non-medical: Not on file  Tobacco Use  . Smoking status: Former Smoker    Packs/day: 1.00    Years: 12.00    Pack years: 12.00    Types: Cigarettes    Last attempt to quit: 05/28/2008    Years since quitting: 9.9  . Smokeless tobacco: Never Used  Substance and Sexual Activity  . Alcohol use: Yes    Comment:  Occasionally, "Once per week"  . Drug use: Yes    Types: Marijuana    Comment: Smoked via a bowl occassionally.  . Sexual activity: Not on file  Lifestyle  . Physical activity:    Days per week: Not on file    Minutes per session: Not on file  . Stress: Not on file  Relationships  . Social connections:    Talks on phone: Not on file    Gets together: Not on file    Attends religious service: Not on file    Active member of club or organization: Not on file    Attends meetings of clubs or organizations: Not on file    Relationship status: Not on file  . Intimate partner violence:    Fear of current or ex partner: Not on file    Emotionally abused: Not on file    Physically abused: Not on file    Forced sexual activity: Not on file  Other Topics Concern  . Not on file  Social History Narrative  . Not on file    FAMILY HISTORY:  Family History  Problem Relation Age of Onset  . Heart failure Mother   . Diabetes Mother   . Hypertension Mother   . Heart failure Sister   . Hypertension Sister   . Cancer Sister   . Colon cancer Neg Hx     CURRENT MEDICATIONS:  Outpatient Encounter Medications as of 05/23/2018  Medication Sig  . aspirin 81 MG tablet Take 81 mg by mouth daily.  . carvedilol (COREG) 12.5 MG tablet Take 1 tablet (12.5 mg total) by mouth 2 (two) times daily with a meal.  . furosemide (LASIX) 20 MG tablet TAKE 1 TABLET BY MOUTH EVERY DAY  . glimepiride (AMARYL) 2 MG tablet Take 2 mg by mouth daily with breakfast.  . HYDROcodone-acetaminophen (NORCO) 10-325 MG tablet Take 1 tablet by mouth every 6 (six) hours as needed for moderate pain.   Marland Kitchen lisinopril (PRINIVIL,ZESTRIL) 2.5 MG tablet Take 2.5 mg by mouth daily.  . Multiple Vitamins-Iron (MULTIVITAMIN/IRON PO) Take 1 tablet by mouth daily.   . simvastatin (ZOCOR) 20 MG tablet Take 20 mg by mouth daily  . spironolactone (ALDACTONE) 25 MG tablet TAKE 12.5 BY MOUTH DAILY  . [DISCONTINUED] terbinafine (LAMISIL) 250  MG tablet Take 250 mg by mouth daily.   No facility-administered encounter medications on file as of 05/23/2018.     ALLERGIES:  No Known Allergies   PHYSICAL EXAM:  ECOG Performance status: 1  Vitals:   05/23/18 1400  BP: 117/70  Pulse: 67  Resp: 16  Temp: 98.8 F (37.1 C)  SpO2: 99%   Filed Weights   05/23/18 1400  Weight: 189 lb (85.7 kg)    Physical Exam Constitutional:      Appearance: Normal appearance. He is normal weight.  Musculoskeletal: Normal range of motion.  Skin:    General: Skin is warm and dry.  Neurological:     Mental Status:  He is alert and oriented to person, place, and time. Mental status is at baseline.  Psychiatric:        Mood and Affect: Mood normal.        Behavior: Behavior normal.        Thought Content: Thought content normal.        Judgment: Judgment normal.      LABORATORY DATA:  I have reviewed the labs as listed.  CBC    Component Value Date/Time   WBC 5.7 05/16/2018 1230   RBC 4.28 05/16/2018 1230   HGB 13.2 05/16/2018 1230   HCT 41.3 05/16/2018 1230   PLT 262 05/16/2018 1230   MCV 96.5 05/16/2018 1230   MCH 30.8 05/16/2018 1230   MCHC 32.0 05/16/2018 1230   RDW 14.0 05/16/2018 1230   LYMPHSABS 2.6 05/16/2018 1230   MONOABS 0.6 05/16/2018 1230   EOSABS 0.1 05/16/2018 1230   BASOSABS 0.0 05/16/2018 1230   CMP Latest Ref Rng & Units 05/16/2018 10/17/2017 05/09/2017  Glucose 70 - 99 mg/dL 132(H) 127(H) 152(H)  BUN 8 - 23 mg/dL 23 21(H) 21(H)  Creatinine 0.61 - 1.24 mg/dL 1.48(H) 1.43(H) 1.42(H)  Sodium 135 - 145 mmol/L 137 137 138  Potassium 3.5 - 5.1 mmol/L 4.3 4.2 4.0  Chloride 98 - 111 mmol/L 104 104 107  CO2 22 - 32 mmol/L 24 26 23   Calcium 8.9 - 10.3 mg/dL 9.1 9.0 9.2  Total Protein 6.5 - 8.1 g/dL 7.9 8.0 7.9  Total Bilirubin 0.3 - 1.2 mg/dL 0.2(L) 0.7 0.2(L)  Alkaline Phos 38 - 126 U/L 75 87 84  AST 15 - 41 U/L 19 17 19   ALT 0 - 44 U/L 26 29 27        DIAGNOSTIC IMAGING:  I have independently reviewed  the scans and discussed with the patient.   I have reviewed Francene Finders, NP's note and agree with the documentation.  I personally performed a face-to-face visit, made revisions and my assessment and plan is as follows.    ASSESSMENT & PLAN:   Smoldering myeloma (HCC) 1.  IgG lambda smoldering multiple myeloma: - Bone marrow biopsy on 07/08/2015 shows normocellular marrow with monoclonal plasmacytosis 10-20%, 46 XY, FISH panel negative - We discussed his blood work dated 05/16/2018, M spike was 1.3.  Free light chain ratio was normal.  Lambda light chains were 58.3.  Hemoglobin was normal.  Calcium was also normal. - I have reviewed results of the skeletal survey dated 05/16/2018 which did not show any lytic lesions. -he does not have any recurrent infections. -We will see him back in 6 months for follow-up.  We will plan to repeat blood work prior to next visit.  2.  CKD: - Creatinine is stable around 1.4.      Orders placed this encounter:  Orders Placed This Encounter  Procedures  . Lactate dehydrogenase  . Protein electrophoresis, serum  . Kappa/lambda light chains  . CBC with Differential/Platelet  . Comprehensive metabolic panel      Derek Jack, MD [place] [phone]

## 2018-06-12 DIAGNOSIS — E119 Type 2 diabetes mellitus without complications: Secondary | ICD-10-CM | POA: Diagnosis not present

## 2018-06-12 DIAGNOSIS — E6609 Other obesity due to excess calories: Secondary | ICD-10-CM | POA: Diagnosis not present

## 2018-06-12 DIAGNOSIS — Z6830 Body mass index (BMI) 30.0-30.9, adult: Secondary | ICD-10-CM | POA: Diagnosis not present

## 2018-06-12 DIAGNOSIS — R05 Cough: Secondary | ICD-10-CM | POA: Diagnosis not present

## 2018-06-12 DIAGNOSIS — I10 Essential (primary) hypertension: Secondary | ICD-10-CM | POA: Diagnosis not present

## 2018-07-09 DIAGNOSIS — R69 Illness, unspecified: Secondary | ICD-10-CM | POA: Diagnosis not present

## 2018-08-05 ENCOUNTER — Other Ambulatory Visit (HOSPITAL_COMMUNITY): Payer: Medicare HMO

## 2018-08-27 ENCOUNTER — Ambulatory Visit: Payer: Medicare HMO | Admitting: Cardiology

## 2018-09-08 ENCOUNTER — Other Ambulatory Visit: Payer: Self-pay | Admitting: Cardiology

## 2018-09-11 ENCOUNTER — Ambulatory Visit (HOSPITAL_COMMUNITY)
Admission: RE | Admit: 2018-09-11 | Discharge: 2018-09-11 | Disposition: A | Discharge status: Home / Self Care | Payer: Medicare HMO | Source: Ambulatory Visit | Attending: Cardiology | Admitting: Cardiology

## 2018-09-11 ENCOUNTER — Other Ambulatory Visit: Payer: Self-pay

## 2018-09-11 ENCOUNTER — Telehealth: Payer: Self-pay | Admitting: *Deleted

## 2018-09-11 DIAGNOSIS — I428 Other cardiomyopathies: Secondary | ICD-10-CM | POA: Insufficient documentation

## 2018-09-11 NOTE — Telephone Encounter (Signed)
-----   Message from Satira Sark, MD sent at 09/11/2018  2:25 PM EDT ----- Results reviewed.  LVEF remains normal range at 55 to 60%.  Continue with current medical regimen.

## 2018-09-11 NOTE — Progress Notes (Signed)
*  PRELIMINARY RESULTS* Echocardiogram 2D Echocardiogram has been performed.  Samuel Germany 09/11/2018, 1:44 PM

## 2018-09-11 NOTE — Telephone Encounter (Signed)
Called patient with test results. No answer. Left message to call back.  

## 2018-09-19 ENCOUNTER — Telehealth: Payer: Self-pay | Admitting: Cardiology

## 2018-09-19 NOTE — Telephone Encounter (Signed)
Virtual Visit Pre-Appointment Phone Call  "(Name), I am calling you today to discuss your upcoming appointment. We are currently trying to limit exposure to the virus that causes COVID-19 by seeing patients at home rather than in the office."  1. "What is the BEST phone number to call the day of the visit?" - include this in appointment notes  2. Do you have or have access to (through a family member/friend) a smartphone with video capability that we can use for your visit?" a. If yes - list this number in appt notes as cell (if different from BEST phone #) and list the appointment type as a VIDEO visit in appointment notes b. If no - list the appointment type as a PHONE visit in appointment notes  3. Confirm consent - "In the setting of the current Covid19 crisis, you are scheduled for a (phone or video) visit with your provider on (date) at (time).  Just as we do with many in-office visits, in order for you to participate in this visit, we must obtain consent.  If you'd like, I can send this to your mychart (if signed up) or email for you to review.  Otherwise, I can obtain your verbal consent now.  All virtual visits are billed to your insurance company just like a normal visit would be.  By agreeing to a virtual visit, we'd like you to understand that the technology does not allow for your provider to perform an examination, and thus may limit your provider's ability to fully assess your condition. If your provider identifies any concerns that need to be evaluated in person, we will make arrangements to do so.  Finally, though the technology is pretty good, we cannot assure that it will always work on either your or our end, and in the setting of a video visit, we may have to convert it to a phone-only visit.  In either situation, we cannot ensure that we have a secure connection.  Are you willing to proceed?" STAFF: Did the patient verbally acknowledge consent to telehealth visit? Document  YES/NO here: Yes  4. Advise patient to be prepared - "Two hours prior to your appointment, go ahead and check your blood pressure, pulse, oxygen saturation, and your weight (if you have the equipment to check those) and write them all down. When your visit starts, your provider will ask you for this information. If you have an Apple Watch or Kardia device, please plan to have heart rate information ready on the day of your appointment. Please have a pen and paper handy nearby the day of the visit as well."  5. Give patient instructions for MyChart download to smartphone OR [email or web address] as below if video visit (depending on what platform provider is using)  6. Inform patient they will receive a phone call 15 minutes prior to their appointment time (may be from unknown caller ID) so they should be prepared to answer    TELEPHONE CALL NOTE  Corey Murphy has been deemed a candidate for a follow-up tele-health visit to limit community exposure during the Covid-19 pandemic. I spoke with the patient via phone to ensure availability of phone/video source, confirm preferred email & phone number, and discuss instructions and expectations.  I reminded Corey Murphy to be prepared with any vital sign and/or heart rhythm information that could potentially be obtained via home monitoring, at the time of his visit. I reminded Corey Murphy to expect a phone call prior to his visit.  Corey Murphy 09/19/2018 12:02 PM

## 2018-09-26 NOTE — Progress Notes (Signed)
Virtual Visit via Telephone Note   This visit type was conducted due to national recommendations for restrictions regarding the COVID-19 Pandemic (e.g. social distancing) in an effort to limit this patient's exposure and mitigate transmission in our community.  Due to his co-morbid illnesses, this patient is at least at moderate risk for complications without adequate follow up.  This format is felt to be most appropriate for this patient at this time.  The patient did not have access to video technology/had technical difficulties with video requiring transitioning to audio format only (telephone).  All issues noted in this document were discussed and addressed.  No physical exam could be performed with this format.  Please refer to the patient's chart for his  consent to telehealth for [facility].   Date:  09/27/2018   ID:  Corey Murphy, DOB [birth year], MRN [id number]  Patient Location: Home Provider Location: Home  PCP:  Redmond School, MD  Cardiologist:  Rozann Lesches, MD Electrophysiologist:  None   Evaluation Performed:  Follow-Up Visit  Chief Complaint:  Cardiac follow-up  History of Present Illness:    Corey Murphy is a 68 y.o. male last seen in October 2019.  He did not have video access and we spoke by phone today.  He continues to do very well.  He does not report any angina or breathlessness beyond NYHA class I.  He and his wife have been walking for 2 miles almost every day of the week, when it rains he uses a recumbent bicycle at home for an hour.  He had previously been going to the gym prior to the COVID-19 pandemic.  I reviewed his medications which are outlined below and stable from a cardiac perspective.  Recent follow-up echocardiogram shows normal LVEF at 55 to 60%, we discussed the results.  The patient does not have symptoms concerning for COVID-19 infection (fever, chills, cough, or new shortness of breath).    Past Medical History:  Diagnosis Date  .  Alcohol dependency ([place])   . Colonic adenoma 2006  . Essential hypertension, benign   . History of cardiac catheterization    03/2013 LM nl, LAD nl, D1/2/3 small - nl, LCX nondom - nl, OM1/2/3 nl, RCA dom  . Hypercholesteremia   . Hypertensive heart disease   . 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 Nonischemic cardiomyopathy (HCC)    LVEF approximately 40% 06/2013 - improved to normal range  . Obesity   . Type 2 diabetes mellitus ([place])    Past Surgical History:  Procedure Laterality Date  . CIRCUMCISION    . COLONOSCOPY  05/20/2004   RMR: Internal hemorrhoids.  Diminutive adenomatous polyp in the mid-right colon, otherwise normal  . COLONOSCOPY N/A 06/19/2012   Procedure: COLONOSCOPY;  Surgeon: Daneil Dolin, MD;  [facility];  Service: Endoscopy;  Laterality: N/A;  8:30AM-rescheduled 10:30am Darius Bump notified pt  . COLONOSCOPY N/A 08/08/2017   Procedure: COLONOSCOPY;  Surgeon: Daneil Dolin, MD;  [facility];  Service: Endoscopy;  Laterality: N/A;  10:30  . Ileocolonoscopy  06/28/2007   RCB:ULAGTX rectum/Submucosal sigmoid diverticula (chronic), doubt clinical significance Hyperplastic polypoid-appearing fold, mid ascending colon, status/Remainder colonic mucosa and terminal ileum mucosa appeared normal   . LEFT AND RIGHT HEART CATHETERIZATION WITH CORONARY ANGIOGRAM  03/19/2013   Procedure: LEFT AND RIGHT HEART CATHETERIZATION WITH CORONARY ANGIOGRAM;  Surgeon: Wellington Hampshire, MD;  Location: [facility]ATH [facility];  Service: Cardiovascular;;  . POLYPECTOMY  08/08/2017   Procedure: POLYPECTOMY;  Surgeon: Daneil Dolin, MD;  [facility];  Service: Endoscopy;;  ascending colon polyps x4 (cold snare-3,hot snare)  . TOTAL HIP ARTHROPLASTY  2010   Left  . TOTAL HIP ARTHROPLASTY  2010   Right     Current Meds  Medication Sig  . aspirin 81 MG tablet Take 81 mg by mouth daily.  . carvedilol (COREG) 12.5 MG tablet Take 1 tablet (12.5 mg total) by  mouth 2 (two) times daily with a meal.  . furosemide (LASIX) 20 MG tablet TAKE 1 TABLET BY MOUTH EVERY DAY  . glimepiride (AMARYL) 2 MG tablet Take 2 mg by mouth 2 (two) times a day.   Marland Kitchen HYDROcodone-acetaminophen (NORCO) 10-325 MG tablet Take 1 tablet by mouth every 6 (six) hours as needed for moderate pain.   Marland Kitchen lisinopril (PRINIVIL,ZESTRIL) 2.5 MG tablet Take 2.5 mg by mouth daily.  . Multiple Vitamins-Iron (MULTIVITAMIN/IRON PO) Take 1 tablet by mouth daily.   . simvastatin (ZOCOR) 20 MG tablet Take 20 mg by mouth daily  . spironolactone (ALDACTONE) 25 MG tablet TAKE 1/2 TABLET BY MOUTH DAILY     Allergies:   Patient has no known allergies.   Social History   Tobacco Use  . Smoking status: Former Smoker    Packs/day: 1.00    Years: 12.00    Pack years: 12.00    Types: Cigarettes    Last attempt to quit: 05/28/2008    Years since quitting: 10.3  . Smokeless tobacco: Never Used  Substance Use Topics  . Alcohol use: Yes    Comment: Occasionally, "Once per week"  . Drug use: Yes    Types: Marijuana    Comment: Smoked via a bowl occassionally.     Family Hx: The patient's family history includes Cancer in his sister; Diabetes in his mother; Heart failure in his mother and sister; Hypertension in his mother and sister. There is no history of Colon cancer.  ROS:   Please see the history of present illness. All other systems reviewed and are negative.   Prior CV studies:   The following studies were reviewed today:  Echocardiogram 09/11/2018:  1. The left ventricle has normal systolic function, with an ejection fraction of 55-60%. The cavity size was normal. There is moderate concentric left ventricular hypertrophy. Left ventricular diastolic Doppler parameters are consistent with impaired  relaxation. Indeterminate filling pressures No evidence of left ventricular regional wall motion abnormalities.  2. The right ventricle has normal systolic function. The cavity was normal.  There is no increase in right ventricular wall thickness.  3. The mitral valve is grossly normal.  4. The tricuspid valve is grossly normal.  5. The aortic valve is tricuspid.  6. The aortic root is normal in size and structure.  Labs/Other Tests and Data Reviewed:    EKG:  An ECG dated 02/26/2018 was personally reviewed today and demonstrated:  Sinus rhythm with decreased R wave progression.  Recent Labs: 05/16/2018: ALT 26; BUN 23; Creatinine, Ser 1.48; Hemoglobin 13.2; Platelets 262; Potassium 4.3; Sodium 137   Recent Lipid Panel Lab Results  Component Value Date/Time   CHOL 144 03/19/2013 04:28 AM   TRIG 110 03/19/2013 04:28 AM   HDL 49 03/19/2013 04:28 AM   CHOLHDL 2.9 03/19/2013 04:28 AM   LDLCALC 73 03/19/2013 04:28 AM    Wt Readings from Last 3 Encounters:  09/27/18 188 lb (85.3 kg)  05/23/18 189 lb (85.7 kg)  02/26/18 181 lb (82.1 kg)  Objective:    Vital Signs:  BP 115/75   Pulse 81   Ht 5\' 7"  (1.702 m)   Wt 188 lb (85.3 kg)   BMI 29.44 kg/m    Patient spoke in full sentences, not short of breath. No audible wheezing or coughing.  ASSESSMENT & PLAN:    1.  History of nonischemic cardiomyopathy with normalization of LVEF on medical therapy.  Recent follow-up echocardiogram shows LVEF 55 to 60%.  Continue with current regimen and regular exercise plan.  2.  Essential hypertension, blood pressure is normal today on medical therapy.  COVID-19 Education: The signs and symptoms of COVID-19 were discussed with the patient and how to seek care for testing (follow up with PCP or arrange E-visit).  The importance of social distancing was discussed today.  Time:   Today, I have spent 5 minutes with the patient with telehealth technology discussing the above problems.     Medication Adjustments/Labs and Tests Ordered: Current medicines are reviewed at length with the patient today.  Concerns regarding medicines are outlined above.   Tests Ordered: No orders  of the defined types were placed in this encounter.   Medication Changes: No orders of the defined types were placed in this encounter.   Disposition:  Follow up 6 months in the [place] office.  Signed, Rozann Lesches, MD  09/27/2018 11:17 AM    [place]

## 2018-09-27 ENCOUNTER — Encounter: Payer: Self-pay | Admitting: Cardiology

## 2018-09-27 ENCOUNTER — Other Ambulatory Visit: Payer: Self-pay

## 2018-09-27 ENCOUNTER — Telehealth (INDEPENDENT_AMBULATORY_CARE_PROVIDER_SITE_OTHER): Payer: Medicare HMO | Admitting: Cardiology

## 2018-09-27 VITALS — BP 115/75 | HR 81 | Ht 67.0 in | Wt 188.0 lb

## 2018-09-27 DIAGNOSIS — I10 Essential (primary) hypertension: Secondary | ICD-10-CM

## 2018-09-27 DIAGNOSIS — Z7189 Other specified counseling: Secondary | ICD-10-CM | POA: Diagnosis not present

## 2018-09-27 DIAGNOSIS — I428 Other cardiomyopathies: Secondary | ICD-10-CM

## 2018-09-27 NOTE — Patient Instructions (Signed)
Medication Instructions: Your physician recommends that you continue on your current medications as directed. Please refer to the Current Medication list given to you today.   Labwork: None  Procedures/Testing: None  Follow-Up: 6 months with Dr.McDowell  Any Additional Special Instructions Will Be Listed Below (If Applicable).     If you need a refill on your cardiac medications before your next appointment, please call your pharmacy.      Thank you for choosing [facility] !        

## 2018-10-01 DIAGNOSIS — H524 Presbyopia: Secondary | ICD-10-CM | POA: Diagnosis not present

## 2018-10-01 DIAGNOSIS — E11319 Type 2 diabetes mellitus with unspecified diabetic retinopathy without macular edema: Secondary | ICD-10-CM | POA: Diagnosis not present

## 2018-10-01 DIAGNOSIS — E119 Type 2 diabetes mellitus without complications: Secondary | ICD-10-CM | POA: Diagnosis not present

## 2018-11-07 DIAGNOSIS — N183 Chronic kidney disease, stage 3 (moderate): Secondary | ICD-10-CM | POA: Diagnosis not present

## 2018-11-13 DIAGNOSIS — I129 Hypertensive chronic kidney disease with stage 1 through stage 4 chronic kidney disease, or unspecified chronic kidney disease: Secondary | ICD-10-CM | POA: Diagnosis not present

## 2018-11-13 DIAGNOSIS — R778 Other specified abnormalities of plasma proteins: Secondary | ICD-10-CM | POA: Diagnosis not present

## 2018-11-13 DIAGNOSIS — E1122 Type 2 diabetes mellitus with diabetic chronic kidney disease: Secondary | ICD-10-CM | POA: Diagnosis not present

## 2018-11-13 DIAGNOSIS — N183 Chronic kidney disease, stage 3 (moderate): Secondary | ICD-10-CM | POA: Diagnosis not present

## 2018-11-21 ENCOUNTER — Inpatient Hospital Stay (HOSPITAL_COMMUNITY): Payer: Medicare HMO | Attending: Hematology

## 2018-11-21 ENCOUNTER — Other Ambulatory Visit: Payer: Self-pay

## 2018-11-21 DIAGNOSIS — Z79899 Other long term (current) drug therapy: Secondary | ICD-10-CM | POA: Insufficient documentation

## 2018-11-21 DIAGNOSIS — N189 Chronic kidney disease, unspecified: Secondary | ICD-10-CM | POA: Insufficient documentation

## 2018-11-21 DIAGNOSIS — E1122 Type 2 diabetes mellitus with diabetic chronic kidney disease: Secondary | ICD-10-CM | POA: Insufficient documentation

## 2018-11-21 DIAGNOSIS — I129 Hypertensive chronic kidney disease with stage 1 through stage 4 chronic kidney disease, or unspecified chronic kidney disease: Secondary | ICD-10-CM | POA: Diagnosis not present

## 2018-11-21 DIAGNOSIS — D472 Monoclonal gammopathy: Secondary | ICD-10-CM

## 2018-11-21 DIAGNOSIS — Z87891 Personal history of nicotine dependence: Secondary | ICD-10-CM | POA: Diagnosis not present

## 2018-11-21 DIAGNOSIS — G479 Sleep disorder, unspecified: Secondary | ICD-10-CM | POA: Insufficient documentation

## 2018-11-21 DIAGNOSIS — C9000 Multiple myeloma not having achieved remission: Secondary | ICD-10-CM | POA: Diagnosis present

## 2018-11-21 DIAGNOSIS — Z7982 Long term (current) use of aspirin: Secondary | ICD-10-CM | POA: Diagnosis not present

## 2018-11-21 DIAGNOSIS — Z7984 Long term (current) use of oral hypoglycemic drugs: Secondary | ICD-10-CM | POA: Diagnosis not present

## 2018-11-21 DIAGNOSIS — E7800 Pure hypercholesterolemia, unspecified: Secondary | ICD-10-CM | POA: Insufficient documentation

## 2018-11-21 LAB — COMPREHENSIVE METABOLIC PANEL
ALT: 41 U/L (ref 0–44)
AST: 21 U/L (ref 15–41)
Albumin: 3.8 g/dL (ref 3.5–5.0)
Alkaline Phosphatase: 80 U/L (ref 38–126)
Anion gap: 9 (ref 5–15)
BUN: 20 mg/dL (ref 8–23)
CO2: 25 mmol/L (ref 22–32)
Calcium: 9.7 mg/dL (ref 8.9–10.3)
Chloride: 104 mmol/L (ref 98–111)
Creatinine, Ser: 1.44 mg/dL — ABNORMAL HIGH (ref 0.61–1.24)
GFR calc Af Amer: 58 mL/min — ABNORMAL LOW (ref >60)
GFR calc non Af Amer: 50 mL/min — ABNORMAL LOW (ref >60)
Glucose, Bld: 218 mg/dL — ABNORMAL HIGH (ref 70–99)
Potassium: 4.4 mmol/L (ref 3.5–5.1)
Sodium: 138 mmol/L (ref 135–145)
Total Bilirubin: 0.5 mg/dL (ref 0.3–1.2)
Total Protein: 8 g/dL (ref 6.5–8.1)

## 2018-11-21 LAB — CBC WITH DIFFERENTIAL/PLATELET
Abs Immature Granulocytes: 0.01 10*3/uL (ref 0.00–0.07)
Basophils Absolute: 0 10*3/uL (ref 0.0–0.1)
Basophils Relative: 1 %
Eosinophils Absolute: 0.1 10*3/uL (ref 0.0–0.5)
Eosinophils Relative: 2 %
HCT: 42.1 % (ref 39.0–52.0)
Hemoglobin: 13.7 g/dL (ref 13.0–17.0)
Immature Granulocytes: 0 %
Lymphocytes Relative: 39 %
Lymphs Abs: 2 10*3/uL (ref 0.7–4.0)
MCH: 30.3 pg (ref 26.0–34.0)
MCHC: 32.5 g/dL (ref 30.0–36.0)
MCV: 93.1 fL (ref 80.0–100.0)
Monocytes Absolute: 0.5 10*3/uL (ref 0.1–1.0)
Monocytes Relative: 9 %
Neutro Abs: 2.5 10*3/uL (ref 1.7–7.7)
Neutrophils Relative %: 49 %
Platelets: 262 10*3/uL (ref 150–400)
RBC: 4.52 MIL/uL (ref 4.22–5.81)
RDW: 13.6 % (ref 11.5–15.5)
WBC: 5 10*3/uL (ref 4.0–10.5)
nRBC: 0 % (ref 0.0–0.2)

## 2018-11-21 LAB — LACTATE DEHYDROGENASE: LDH: 96 U/L — ABNORMAL LOW (ref 98–192)

## 2018-11-22 LAB — KAPPA/LAMBDA LIGHT CHAINS
Kappa free light chain: 24.4 mg/L — ABNORMAL HIGH (ref 3.3–19.4)
Kappa, lambda light chain ratio: 0.49 (ref 0.26–1.65)
Lambda free light chains: 49.7 mg/L — ABNORMAL HIGH (ref 5.7–26.3)

## 2018-11-24 LAB — PROTEIN ELECTROPHORESIS, SERUM
A/G Ratio: 1.1 (ref 0.7–1.7)
Albumin ELP: 3.7 g/dL (ref 2.9–4.4)
Alpha-1-Globulin: 0.2 g/dL (ref 0.0–0.4)
Alpha-2-Globulin: 0.6 g/dL (ref 0.4–1.0)
Beta Globulin: 1.1 g/dL (ref 0.7–1.3)
Gamma Globulin: 1.5 g/dL (ref 0.4–1.8)
Globulin, Total: 3.5 g/dL (ref 2.2–3.9)
M-Spike, %: 0.9 g/dL — ABNORMAL HIGH
Total Protein ELP: 7.2 g/dL (ref 6.0–8.5)

## 2018-11-27 ENCOUNTER — Other Ambulatory Visit: Payer: Self-pay

## 2018-11-28 ENCOUNTER — Encounter (HOSPITAL_COMMUNITY): Payer: Self-pay | Admitting: Hematology

## 2018-11-28 ENCOUNTER — Inpatient Hospital Stay (HOSPITAL_COMMUNITY): Payer: Medicare HMO | Admitting: Hematology

## 2018-11-28 DIAGNOSIS — N189 Chronic kidney disease, unspecified: Secondary | ICD-10-CM

## 2018-11-28 DIAGNOSIS — C9000 Multiple myeloma not having achieved remission: Secondary | ICD-10-CM

## 2018-11-28 DIAGNOSIS — Z7982 Long term (current) use of aspirin: Secondary | ICD-10-CM

## 2018-11-28 DIAGNOSIS — Z87891 Personal history of nicotine dependence: Secondary | ICD-10-CM | POA: Diagnosis not present

## 2018-11-28 DIAGNOSIS — I129 Hypertensive chronic kidney disease with stage 1 through stage 4 chronic kidney disease, or unspecified chronic kidney disease: Secondary | ICD-10-CM

## 2018-11-28 DIAGNOSIS — G479 Sleep disorder, unspecified: Secondary | ICD-10-CM | POA: Diagnosis not present

## 2018-11-28 DIAGNOSIS — Z7984 Long term (current) use of oral hypoglycemic drugs: Secondary | ICD-10-CM | POA: Diagnosis not present

## 2018-11-28 DIAGNOSIS — E7800 Pure hypercholesterolemia, unspecified: Secondary | ICD-10-CM

## 2018-11-28 DIAGNOSIS — Z79899 Other long term (current) drug therapy: Secondary | ICD-10-CM

## 2018-11-28 DIAGNOSIS — E1122 Type 2 diabetes mellitus with diabetic chronic kidney disease: Secondary | ICD-10-CM

## 2018-11-28 DIAGNOSIS — D472 Monoclonal gammopathy: Secondary | ICD-10-CM

## 2018-11-28 NOTE — Assessment & Plan Note (Signed)
1.  IgG lambda smoldering multiple myeloma: -Bone marrow biopsy on 07/08/2015 shows normocellular marrow with monoclonal plasmacytosis, 10-20%, 46 XY, myeloma FISH panel negative. - Skeletal survey on 05/16/2018 did not show any lytic lesions. -Denies any fevers, night sweats or weight loss in the last 6 months.  Denies any new onset bone pains. - We reviewed labs dated 11/21/2018 which shows creatinine 1.4 stable.  Calcium was normal.  M spike was 0.9 g/dL.  Free lambda light chains of 49.7, down from the 8 and ratio of 0.49.  Hemoglobin was 13.7. - He does not report any recurrent infections. - We reviewed the normal prognosis of smoldering myeloma with 10% risk of developing into myeloma per year.  2.  CKD: - Creatinine on 11/21/2018 was 1.44 and has been stable in the 1.4 range for the last year.

## 2018-11-28 NOTE — Patient Instructions (Addendum)
[facility] Discharge Instructions  You were seen today by Dr. Katragadda. He went over your recent lab results. He will see you back in 6 months for labs and follow up.   Thank you for choosing [facility] at [facility] to provide your oncology and hematology care.  To afford each patient quality time with our provider, please arrive at least 15 minutes before your scheduled appointment time.   If you have a lab appointment with the Cancer Center please come in thru the  Main Entrance and check in at the main information desk  You need to re-schedule your appointment should you arrive 10 or more minutes late.  We strive to give you quality time with our providers, and arriving late affects you and other patients whose appointments are after yours.  Also, if you no show three or more times for appointments you may be dismissed from the clinic at the providers discretion.     Again, thank you for choosing [facility].  Our hope is that these requests will decrease the amount of time that you wait before being seen by our physicians.       _____________________________________________________________  Should you have questions after your visit to [facility], please contact our office at [phone] between the hours of 8:00 a.m. and 4:30 p.m.  Voicemails left after 4:00 p.m. will not be returned until the following business day.  For prescription refill requests, have your pharmacy contact our office and allow 72 hours.    Cancer Center Support Programs:   > Cancer Support Group  2nd Tuesday of the month 1pm-2pm, Journey Room    

## 2018-11-28 NOTE — Progress Notes (Signed)
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 male is seen here for follow-up of smoldering plasma cell myeloma.  Appetite and energy levels are stable.  He denies any new onset pains.  Denies any fevers, night sweats or weight loss in the last 6 months.  Denies any infections or hospitalizations.  Has chronic sleep problems which are stable.  Denies any history of blood transfusions.  No tingling or numbness in the extremities was noted.  No bleeding per rectum or melena.  No change in bowel habits.  REVIEW OF SYSTEMS:  Review of Systems  Psychiatric/Behavioral: Positive for sleep disturbance.  All other systems reviewed and are negative.    PAST MEDICAL/SURGICAL HISTORY:  Past Medical History:  Diagnosis Date  . Alcohol dependency ([place])   . Colonic adenoma 2006  . Essential hypertension, benign   . History of cardiac catheterization    03/2013 LM nl, LAD nl, D1/2/3 small - nl, LCX nondom - nl, OM1/2/3 nl, RCA dom  . Hypercholesteremia   . Hypertensive heart disease   . 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 Nonischemic cardiomyopathy (HCC)    LVEF approximately 40% 06/2013 - improved to normal range  . Obesity   . Type 2 diabetes mellitus ([name])    Past Surgical History:  Procedure Laterality Date  . CIRCUMCISION    . COLONOSCOPY  05/20/2004   RMR: Internal hemorrhoids.  Diminutive adenomatous polyp in the mid-right colon, otherwise normal  . COLONOSCOPY N/A 06/19/2012   Procedure: COLONOSCOPY;  Surgeon: Daneil Dolin, MD;  [facility];  Service: Endoscopy;  Laterality: N/A;  8:30AM-rescheduled 10:30am Darius Bump notified pt  . COLONOSCOPY N/A 08/08/2017   Procedure: COLONOSCOPY;  Surgeon: Daneil Dolin, MD;  [facility];  Service: Endoscopy;  Laterality: N/A;  10:30  . Ileocolonoscopy  06/28/2007   HDQ:QIWLNL rectum/Submucosal sigmoid diverticula (chronic), doubt clinical significance Hyperplastic polypoid-appearing fold, mid ascending colon, status/Remainder colonic mucosa and terminal ileum mucosa appeared normal   . LEFT AND RIGHT HEART CATHETERIZATION WITH CORONARY ANGIOGRAM  03/19/2013   Procedure: LEFT AND RIGHT HEART CATHETERIZATION WITH CORONARY ANGIOGRAM;  Surgeon: Wellington Hampshire, MD;  Location: [facility];  Service: Cardiovascular;;  . POLYPECTOMY  08/08/2017   Procedure: POLYPECTOMY;  Surgeon: Daneil Dolin, MD;  [facility];  Service: Endoscopy;;  ascending colon polyps x4 (cold snare-3,hot snare)  . TOTAL HIP ARTHROPLASTY  2010   Left  . TOTAL HIP ARTHROPLASTY  2010   Right     SOCIAL HISTORY:  Social History   Socioeconomic History  . Marital status: Married    Spouse name: Not on file  . Number of children: 2  . Years of education: Not on file  . Highest education level: Not on file  Occupational History  . Occupation: Retired, Fish farm manager  . Financial resource strain: Not on file  . Food insecurity    Worry: Not on file    Inability: Not on file  . Transportation needs    Medical: Not on file    Non-medical: Not on file  Tobacco Use  . Smoking status: Former Smoker    Packs/day: 1.00    Years: 12.00  Pack years: 12.00    Types: Cigarettes    Quit date: 05/28/2008    Years since quitting: 10.5  . Smokeless tobacco: Never Used  Substance and Sexual Activity  . Alcohol use: Yes    Comment: Occasionally, "Once per week"  . Drug use: Yes    Types: Marijuana    Comment: Smoked via a bowl occassionally.  . Sexual activity: Not on file  Lifestyle  . Physical activity    Days per week: Not on file    Minutes per session: Not on file  . Stress: Not on file   Relationships  . Social Herbalist on phone: Not on file    Gets together: Not on file    Attends religious service: Not on file    Active member of club or organization: Not on file    Attends meetings of clubs or organizations: Not on file    Relationship status: Not on file  . Intimate partner violence    Fear of current or ex partner: Not on file    Emotionally abused: Not on file    Physically abused: Not on file    Forced sexual activity: Not on file  Other Topics Concern  . Not on file  Social History Narrative  . Not on file    FAMILY HISTORY:  Family History  Problem Relation Age of Onset  . Heart failure Mother   . Diabetes Mother   . Hypertension Mother   . Heart failure Sister   . Hypertension Sister   . Cancer Sister   . Colon cancer Neg Hx     CURRENT MEDICATIONS:  Outpatient Encounter Medications as of 11/28/2018  Medication Sig  . aspirin 81 MG tablet Take 81 mg by mouth daily.  . carvedilol (COREG) 12.5 MG tablet Take 1 tablet (12.5 mg total) by mouth 2 (two) times daily with a meal.  . furosemide (LASIX) 20 MG tablet TAKE 1 TABLET BY MOUTH EVERY DAY  . glimepiride (AMARYL) 2 MG tablet Take 2 mg by mouth 2 (two) times a day.   Marland Kitchen HYDROcodone-acetaminophen (NORCO) 10-325 MG tablet Take 1 tablet by mouth every 6 (six) hours as needed for moderate pain.   Marland Kitchen lisinopril (PRINIVIL,ZESTRIL) 2.5 MG tablet Take 2.5 mg by mouth daily.  . Multiple Vitamins-Iron (MULTIVITAMIN/IRON PO) Take 1 tablet by mouth daily.   . simvastatin (ZOCOR) 20 MG tablet Take 20 mg by mouth daily  . spironolactone (ALDACTONE) 25 MG tablet TAKE 1/2 TABLET BY MOUTH DAILY   No facility-administered encounter medications on file as of 11/28/2018.     ALLERGIES:  Allergies  Allergen Reactions  . Other      PHYSICAL EXAM:  ECOG Performance status: 1  Vitals:   11/28/18 1420  BP: 118/83  Pulse: 70  Resp: 16  Temp: 98.4 F (36.9 C)  SpO2: 98%   Filed Weights    11/28/18 1420  Weight: 204 lb 12.8 oz (92.9 kg)    Physical Exam Constitutional:      Appearance: Normal appearance. He is normal weight.  Cardiovascular:     Rate and Rhythm: Normal rate and regular rhythm.     Heart sounds: Normal heart sounds.  Pulmonary:     Effort: Pulmonary effort is normal.     Breath sounds: Normal breath sounds.  Abdominal:     General: There is no distension.     Palpations: Abdomen is soft. There is no mass.  Musculoskeletal: Normal range of  motion.  Skin:    General: Skin is warm and dry.  Neurological:     Mental Status: He is alert and oriented to person, place, and time. Mental status is at baseline.  Psychiatric:        Mood and Affect: Mood normal.        Behavior: Behavior normal.      LABORATORY DATA:  I have reviewed the labs as listed.  CBC    Component Value Date/Time   WBC 5.0 11/21/2018 1224   RBC 4.52 11/21/2018 1224   HGB 13.7 11/21/2018 1224   HCT 42.1 11/21/2018 1224   PLT 262 11/21/2018 1224   MCV 93.1 11/21/2018 1224   MCH 30.3 11/21/2018 1224   MCHC 32.5 11/21/2018 1224   RDW 13.6 11/21/2018 1224   LYMPHSABS 2.0 11/21/2018 1224   MONOABS 0.5 11/21/2018 1224   EOSABS 0.1 11/21/2018 1224   BASOSABS 0.0 11/21/2018 1224   CMP Latest Ref Rng & Units 11/21/2018 05/16/2018 10/17/2017  Glucose 70 - 99 mg/dL 218(H) 132(H) 127(H)  BUN 8 - 23 mg/dL 20 23 21(H)  Creatinine 0.61 - 1.24 mg/dL 1.44(H) 1.48(H) 1.43(H)  Sodium 135 - 145 mmol/L 138 137 137  Potassium 3.5 - 5.1 mmol/L 4.4 4.3 4.2  Chloride 98 - 111 mmol/L 104 104 104  CO2 22 - 32 mmol/L _0 Calcium 8.9 - 10.3 mg/dL 9.7 9.1 9.0  Total Protein 6.5 - 8.1 g/dL 8.0 7.9 8.0  Total Bilirubin 0.3 - 1.2 mg/dL 0.5 0.2(L) 0.7  Alkaline Phos 38 - 126 U/L 80 75 87  AST 15 - 41 U/L _1 ALT 0 - 44 U/L 41 26 29       DIAGNOSTIC IMAGING:  I have independently reviewed the scans and discussed with the patient.    ASSESSMENT & PLAN:   Smoldering myeloma (HCC)  1.  IgG lambda smoldering multiple myeloma: -Bone marrow biopsy on 07/08/2015 shows normocellular marrow with monoclonal plasmacytosis, 10-20%, 46 XY, myeloma FISH panel negative. - Skeletal survey on 05/16/2018 did not show any lytic lesions. -Denies any fevers, night sweats or weight loss in the last 6 months.  Denies any new onset bone pains. - We reviewed labs dated 11/21/2018 which shows creatinine 1.4 stable.  Calcium was normal.  M spike was 0.9 g/dL.  Free lambda light chains of 49.7, down from the 8 and ratio of 0.49.  Hemoglobin was 13.7. - He does not report any recurrent infections. - We reviewed the normal prognosis of smoldering myeloma with 10% risk of developing into myeloma per year.  2.  CKD: - Creatinine on 11/21/2018 was 1.44 and has been stable in the 1.4 range for the last year.   Total time spent is 25 minutes with more than face-to-face discussing his diagnosis, prognosis, counseling and coordination of care.  Orders placed this encounter:  No orders of the defined types were placed in this encounter.     Derek Jack, MD [place] [phone]

## 2018-12-12 DIAGNOSIS — E1140 Type 2 diabetes mellitus with diabetic neuropathy, unspecified: Secondary | ICD-10-CM | POA: Diagnosis not present

## 2018-12-12 DIAGNOSIS — M7711 Lateral epicondylitis, right elbow: Secondary | ICD-10-CM | POA: Diagnosis not present

## 2018-12-12 DIAGNOSIS — R201 Hypoesthesia of skin: Secondary | ICD-10-CM | POA: Diagnosis not present

## 2018-12-12 DIAGNOSIS — Z6831 Body mass index (BMI) 31.0-31.9, adult: Secondary | ICD-10-CM | POA: Diagnosis not present

## 2018-12-12 DIAGNOSIS — I10 Essential (primary) hypertension: Secondary | ICD-10-CM | POA: Diagnosis not present

## 2018-12-12 DIAGNOSIS — E1129 Type 2 diabetes mellitus with other diabetic kidney complication: Secondary | ICD-10-CM | POA: Diagnosis not present

## 2018-12-30 DIAGNOSIS — I2510 Atherosclerotic heart disease of native coronary artery without angina pectoris: Secondary | ICD-10-CM | POA: Diagnosis not present

## 2018-12-30 DIAGNOSIS — G894 Chronic pain syndrome: Secondary | ICD-10-CM | POA: Diagnosis not present

## 2018-12-30 DIAGNOSIS — I10 Essential (primary) hypertension: Secondary | ICD-10-CM | POA: Diagnosis not present

## 2018-12-30 DIAGNOSIS — E7849 Other hyperlipidemia: Secondary | ICD-10-CM | POA: Diagnosis not present

## 2019-01-01 DIAGNOSIS — Z23 Encounter for immunization: Secondary | ICD-10-CM | POA: Diagnosis not present

## 2019-01-01 DIAGNOSIS — M7711 Lateral epicondylitis, right elbow: Secondary | ICD-10-CM | POA: Diagnosis not present

## 2019-01-01 DIAGNOSIS — Z6832 Body mass index (BMI) 32.0-32.9, adult: Secondary | ICD-10-CM | POA: Diagnosis not present

## 2019-01-01 DIAGNOSIS — E6609 Other obesity due to excess calories: Secondary | ICD-10-CM | POA: Diagnosis not present

## 2019-01-01 DIAGNOSIS — K625 Hemorrhage of anus and rectum: Secondary | ICD-10-CM | POA: Diagnosis not present

## 2019-01-17 ENCOUNTER — Other Ambulatory Visit: Payer: Self-pay

## 2019-01-17 MED ORDER — FUROSEMIDE 20 MG PO TABS: 20.0000 mg | ORAL_TABLET | Freq: Every day | ORAL | 3 refills | Status: DC

## 2019-01-17 NOTE — Telephone Encounter (Signed)
Refilled lasix

## 2019-01-29 DIAGNOSIS — I10 Essential (primary) hypertension: Secondary | ICD-10-CM | POA: Diagnosis not present

## 2019-01-29 DIAGNOSIS — E119 Type 2 diabetes mellitus without complications: Secondary | ICD-10-CM | POA: Diagnosis not present

## 2019-01-29 DIAGNOSIS — I2510 Atherosclerotic heart disease of native coronary artery without angina pectoris: Secondary | ICD-10-CM | POA: Diagnosis not present

## 2019-01-29 DIAGNOSIS — E7800 Pure hypercholesterolemia, unspecified: Secondary | ICD-10-CM | POA: Diagnosis not present

## 2019-02-05 ENCOUNTER — Encounter: Payer: Self-pay | Admitting: Internal Medicine

## 2019-02-05 DIAGNOSIS — Z0001 Encounter for general adult medical examination with abnormal findings: Secondary | ICD-10-CM | POA: Diagnosis not present

## 2019-02-05 DIAGNOSIS — E6609 Other obesity due to excess calories: Secondary | ICD-10-CM | POA: Diagnosis not present

## 2019-02-05 DIAGNOSIS — I509 Heart failure, unspecified: Secondary | ICD-10-CM | POA: Diagnosis not present

## 2019-02-05 DIAGNOSIS — I10 Essential (primary) hypertension: Secondary | ICD-10-CM | POA: Diagnosis not present

## 2019-02-05 DIAGNOSIS — Z0000 Encounter for general adult medical examination without abnormal findings: Secondary | ICD-10-CM | POA: Diagnosis not present

## 2019-02-05 DIAGNOSIS — E1129 Type 2 diabetes mellitus with other diabetic kidney complication: Secondary | ICD-10-CM | POA: Diagnosis not present

## 2019-02-05 DIAGNOSIS — E669 Obesity, unspecified: Secondary | ICD-10-CM | POA: Diagnosis not present

## 2019-02-05 DIAGNOSIS — Z125 Encounter for screening for malignant neoplasm of prostate: Secondary | ICD-10-CM | POA: Diagnosis not present

## 2019-02-05 DIAGNOSIS — Z6832 Body mass index (BMI) 32.0-32.9, adult: Secondary | ICD-10-CM | POA: Diagnosis not present

## 2019-02-05 DIAGNOSIS — Z1389 Encounter for screening for other disorder: Secondary | ICD-10-CM | POA: Diagnosis not present

## 2019-02-16 NOTE — Progress Notes (Signed)
Referring Provider: Dr. Gerarda Fraction Primary Care Physician:  Red[facility] School, MD Primary Gastroenterologist:  Dr. Gala Romney  Chief Complaint  Patient presents with  . Rectal Bleeding    fecal leakage    HPI:   Corey Murphy is a 68 y.o. male presenting today at the request of Dr. Gerarda Fraction for rectal bleeding.   He was last seen by our staff at the time of his colonoscopy. Colonoscopy 08/08/17 with diverticulosis in sigmoid, descending, and transverse colon, three 4-7 mm polyps in ascending colon, and one 9 mm polyp in ascending colon. Pathology with tubular adenomas. Recommended repeat in 3 years.   11/21/18: Hgb 13.7  Today he states, he is having tissue hematochezia. No bright red blood in toilet water or on stool. Has been present occasionally over the years. Would happen if he ate pork sausages. 1.5 months ago, started occurring daily. This week hasn't been as bad. Has stopped eating salty, fried, and junk food to try to lose weight. Symptoms seem to be improving. No black stools. No hard stools or straining. About 1 month ago, he was taking laxatives because he wasn't having BM daily like he though he should. Would take a laxative he is missed one day without a BM. He has stopped laxatives. Taking Metamucil occasionally. This helps with bowel regularity. No known hemorrhoids. No prolapsing tissue. Reports fecal leakage with a little blood seepage when he is exercising. This will create rectal irritation, burning and itching. This is new within the last 1.5 months. No sharp rectal pain.   No abdominal pain. No lightheadedness, dizziness, or feeling like he will pass out. No fever, chills, nausea, vomiting. Occasional acid reflux. Taking prilosec OTC as need. Maybe once a month. No dysphagia.     Past Medical History:  Diagnosis Date  . Alcohol dependency ([place])   . Colonic adenoma 2006  . Essential hypertension, benign   . History of cardiac catheterization    03/2013 LM nl, LAD nl, D1/2/3 small  - nl, LCX nondom - nl, OM1/2/3 nl, RCA dom  . Hypercholesteremia   . Hypertensive heart disease   . 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 Nonischemic cardiomyopathy (HCC)    LVEF approximately 40% 06/2013 - improved to normal range  . Obesity   . Type 2 diabetes mellitus ([place])     Past Surgical History:  Procedure Laterality Date  . CIRCUMCISION    . COLONOSCOPY  05/20/2004   RMR: Internal hemorrhoids.  Diminutive adenomatous polyp in the mid-right colon, otherwise normal  . COLONOSCOPY N/A 06/19/2012   Procedure: COLONOSCOPY;  Surgeon: Daneil Dolin, MD;  [facility];  Service: Endoscopy;  Laterality: N/A;  8:30AM-rescheduled 10:30am Darius Bump notified pt  . COLONOSCOPY N/A 08/08/2017   Surgeon: Daneil Dolin, MD;  diverticulosis in sigmoid, descending, and transverse colon, three 4-7 mm polyps in ascending colon, and one 9 mm polyp in ascending colon. Pathology with tubular adenomas. Repeat in 3 years.  . Ileocolonoscopy  06/28/2007   [id number] rectum/Submucosal sigmoid diverticula (chronic), doubt clinical significance Hyperplastic polypoid-appearing fold, mid ascending colon, status/Remainder colonic mucosa and terminal ileum mucosa appeared normal   . LEFT AND RIGHT HEART CATHETERIZATION WITH CORONARY ANGIOGRAM  03/19/2013   Procedure: LEFT AND RIGHT HEART CATHETERIZATION WITH CORONARY ANGIOGRAM;  Surgeon: Wellington Hampshire, MD;  Location: [facility];  Service: Cardiovascular;;  . POLYPECTOMY  08/08/2017   Procedure: POLYPECTOMY;  Surgeon: Daneil Dolin, MD;  [facility];  Service: Endoscopy;;  ascending colon polyps x4 (cold snare-3,hot snare)  . TOTAL HIP ARTHROPLASTY  2010   Left  . TOTAL HIP ARTHROPLASTY  2010   Right    Current Outpatient Medications  Medication Sig Dispense Refill  . aspirin 81 MG tablet Take 81 mg by mouth daily.    . carvedilol (COREG) 12.5 MG tablet Take 1 tablet (12.5 mg total) by mouth 2 (two) times daily with a  meal. 180 tablet 3  . furosemide (LASIX) 20 MG tablet Take 1 tablet (20 mg total) by mouth daily. 90 tablet 3  . glimepiride (AMARYL) 2 MG tablet Take 2 mg by mouth 2 (two) times a day.     Marland Kitchen HYDROcodone-acetaminophen (NORCO) 10-325 MG tablet Take 1 tablet by mouth as needed for moderate pain.     Marland Kitchen lisinopril (PRINIVIL,ZESTRIL) 2.5 MG tablet Take 2.5 mg by mouth daily.    . Multiple Vitamins-Iron (MULTIVITAMIN/IRON PO) Take 1 tablet by mouth daily.     . simvastatin (ZOCOR) 20 MG tablet Take 20 mg by mouth daily  3  . sitaGLIPtin (JANUVIA) 50 MG tablet Take 50 mg by mouth daily.    Marland Kitchen spironolactone (ALDACTONE) 25 MG tablet TAKE 1/2 TABLET BY MOUTH DAILY 135 tablet 2  . hydrocortisone (ANUSOL-HC) 2.5 % rectal cream Place 1 application rectally 2 (two) times daily. 30 g 1   No current facility-administered medications for this visit.     Allergies as of 02/17/2019 - Review Complete 02/17/2019  Allergen Reaction Noted  . Other  11/28/2018  . Peanut-containing drug products Rash 02/17/2019    Family History  Problem Relation Age of Onset  . Heart failure Mother   . Diabetes Mother   . Hypertension Mother   . Heart failure Sister   . Hypertension Sister   . Cancer Sister   . Colon cancer Neg Hx     Social History   Socioeconomic History  . Marital status: Married    Spouse name: Not on file  . Number of children: 2  . Years of education: Not on file  . Highest education level: Not on file  Occupational History  . Occupation: Retired, Fish farm manager  . Financial resource strain: Not on file  . Food insecurity    Worry: Not on file    Inability: Not on file  . Transportation needs    Medical: Not on file    Non-medical: Not on file  Tobacco Use  . Smoking status: Former Smoker    Packs/day: 1.00    Years: 12.00    Pack years: 12.00    Types: Cigarettes    Quit date: 05/28/2008    Years since quitting: 10.7  . Smokeless tobacco: Never Used  Substance and  Sexual Activity  . Alcohol use: Yes    Comment: Occasionally 2 to 3 times a month  . Drug use: Yes    Types: Marijuana    Comment: Smoked via a bowl occassionally.   . Sexual activity: Not on file  Lifestyle  . Physical activity    Days per week: Not on file    Minutes per session: Not on file  . Stress: Not on file  Relationships  . Social Herbalist on phone: Not on file    Gets together: Not on file    Attends religious service: Not on file    Active member of club or organization: Not on file    Attends meetings of clubs or organizations: Not  on file    Relationship status: Not on file  . Intimate partner violence    Fear of current or ex partner: Not on file    Emotionally abused: Not on file    Physically abused: Not on file    Forced sexual activity: Not on file  Other Topics Concern  . Not on file  Social History Narrative  . Not on file    Review of Systems: Gen: See HPI CV: Denies chest pain, heart palpitations Resp: Denies shortness of breath or cough GI: See HPI GU : Denies urinary burning, urinary frequency, urinary hesitancy MS: Occasional stiffness and pain if riding in a car for long periods of time. Will use Norco to help with stiffness and pain.  Derm: Denies rash Heme: See HPI  Physical Exam: BP 124/76   Pulse 78   Temp (!) 96.9 F (36.1 C)   Ht 5\' 7"  (1.702 m)   Wt 205 lb 6.4 oz (93.2 kg)   BMI 32.17 kg/m  General:   Alert and oriented. Pleasant and cooperative. Well-nourished and well-developed.  Head:  Normocephalic and atraumatic. Eyes:  Without icterus, sclera clear and conjunctiva pink.  Ears:  Normal auditory acuity. Nose:  No deformity, discharge,  or lesions. Lungs:  Clear to auscultation bilaterally. No wheezes, rales, or rhonchi. No distress.  Heart:  S1, S2 present without murmurs appreciated.  Abdomen:  +BS, soft, non-tender and non-distended. No HSM noted. No guarding or rebound. Small reducable umbilical hernia.    Rectal:  External skin tag and small perianal excoriation. No internal hemorrhoids felt on exam. Normal sphincter tone. No masses appreciated. No blood on exam. No pain during exam.  Msk:  Symmetrical without gross deformities. Normal posture. Extremities:  Without edema. Neurologic:  Alert and  oriented x4;  grossly normal neurologically. Skin:  Intact without significant lesions or rashes. Psych: Normal mood and affect.

## 2019-02-17 ENCOUNTER — Encounter: Payer: Self-pay | Admitting: Gastroenterology

## 2019-02-17 ENCOUNTER — Other Ambulatory Visit: Payer: Self-pay

## 2019-02-17 ENCOUNTER — Ambulatory Visit: Payer: Medicare HMO | Admitting: Gastroenterology

## 2019-02-17 VITALS — BP 124/76 | HR 78 | Temp 96.9°F | Ht 67.0 in | Wt 205.4 lb

## 2019-02-17 DIAGNOSIS — K625 Hemorrhage of anus and rectum: Secondary | ICD-10-CM | POA: Diagnosis not present

## 2019-02-17 MED ORDER — HYDROCORTISONE (PERIANAL) 2.5 % EX CREA: 1.0000 "application " | TOPICAL_CREAM | Freq: Two times a day (BID) | CUTANEOUS | 1 refills | Status: DC

## 2019-02-17 NOTE — Assessment & Plan Note (Addendum)
68 y.o. male with past medical history of HTN, HLD, type 2 diabetes, smoldering myeloma, and colon polyps who presents for rectal bleeding. Patient reports history of rare tissue hematochezia over the last several years that has increased to daily over the last 1.5 months. No blood in the stool or in toilet water. No constipation or straining. No known hemorrhoids. Reports rectal irritation, fecal seepage, and small amount of blood per rectum when exercising. Some decrease in frequency since decreasing intake of salty and fried foods over the last week. Rectal exam with external skin tag and small perianal excoriation. No internal hemorrhoids felt on exam. Normal sphincter tone. No masses, blood, or pain during exam. Last colonoscopy 08/08/17 with diverticulosis in sigmoid, descending, and transverse colon, three 4-7 mm polyps in ascending colon, and one 9 mm polyp in ascending colon. Pathology with tubular adenomas. Recommended repeat in 3 years.   With tissue hematochezia, fecal seepage, and some rectal irritation, I am suspicious for bleeding internal hemorrhoids even though none were felt on exam today and none were noted on TCS in 2019. Can't exclude polyps or malignancy.   Anusol BID x 10 days empirically. Requested patient to call in 2 weeks to let me know how this worked.  Advised he use Tucks pads and ensure he keeps his bottom clean and dry.  Proceed with TCS with Dr. Gala Romney in the near future. The risks, benefits, and alternatives have been discussed in detail with patient. They have stated understanding and desire to proceed.  Patient does have prescription for Norco. He only uses this when traveling long distances and gets stiff. Was also on Norco at time of last TCS, and he tolerated the procedure well with moderate anesthesia.   Follow-up after procedure.

## 2019-02-17 NOTE — Patient Instructions (Addendum)
I am sending in Anusol cream. Please apply this twice daily per your rectum for 10 days to see if this stops the bleeding.   Use Tucks pads to help prevent irritation when wiping.   You do have a small excoriation of the skin around your rectal opening. This is likely related to irritation from increased moisture and wiping. Ensure you are keeping you bottom clean and dry.   We will get you scheduled for a colonosocpy with Dr. Gala Romney to evaluate your rectal bleeding.  Day before colonoscopy: glimepiride 1/2 tablet in the morning and evening and 1/2 tablet of Januvia.  Morning of colonoscopy: hold glimepiride and Januvia.  We will see you back in office after your colonoscopy.   Aliene Altes, PA-C [facility]

## 2019-02-18 ENCOUNTER — Other Ambulatory Visit: Payer: Self-pay

## 2019-02-18 DIAGNOSIS — K625 Hemorrhage of anus and rectum: Secondary | ICD-10-CM

## 2019-02-18 MED ORDER — PEG 3350-KCL-NA BICARB-NACL 420 G PO SOLR: 4000.0000 mL | ORAL | 0 refills | Status: DC

## 2019-02-18 NOTE — Progress Notes (Signed)
CC'ED TO PCP 

## 2019-03-01 DIAGNOSIS — E1129 Type 2 diabetes mellitus with other diabetic kidney complication: Secondary | ICD-10-CM | POA: Diagnosis not present

## 2019-03-01 DIAGNOSIS — Z79899 Other long term (current) drug therapy: Secondary | ICD-10-CM | POA: Diagnosis not present

## 2019-03-01 DIAGNOSIS — E1140 Type 2 diabetes mellitus with diabetic neuropathy, unspecified: Secondary | ICD-10-CM | POA: Diagnosis not present

## 2019-03-01 DIAGNOSIS — Z7984 Long term (current) use of oral hypoglycemic drugs: Secondary | ICD-10-CM | POA: Diagnosis not present

## 2019-03-05 ENCOUNTER — Other Ambulatory Visit: Payer: Self-pay | Admitting: Cardiology

## 2019-04-10 ENCOUNTER — Telehealth: Payer: Self-pay | Admitting: *Deleted

## 2019-04-10 NOTE — Telephone Encounter (Signed)
Called pt to make him aware of procedure time change.  Pt was not at home.  Informed his wife (listed on DPR) of the time change to 9:30.  She is aware that he should be there at 8:30 to register.  She is going to get him to call us back so we can go over the time changes for his prep instructions.

## 2019-04-11 ENCOUNTER — Encounter: Payer: Self-pay | Admitting: *Deleted

## 2019-04-11 NOTE — Progress Notes (Signed)
Called pt and he is aware of the new time change of his procedure.  Pt is aware that new prep instructions will be up front for him to pick up.

## 2019-04-21 ENCOUNTER — Other Ambulatory Visit (HOSPITAL_COMMUNITY): Payer: Medicare HMO

## 2019-04-21 ENCOUNTER — Other Ambulatory Visit: Payer: Self-pay

## 2019-04-21 ENCOUNTER — Other Ambulatory Visit (HOSPITAL_COMMUNITY)
Admission: RE | Admit: 2019-04-21 | Discharge: 2019-04-21 | Disposition: A | Discharge status: Home / Self Care | Payer: Medicare HMO | Source: Ambulatory Visit | Attending: Internal Medicine | Admitting: Internal Medicine

## 2019-04-21 DIAGNOSIS — Z20828 Contact with and (suspected) exposure to other viral communicable diseases: Secondary | ICD-10-CM | POA: Diagnosis not present

## 2019-04-21 LAB — SARS CORONAVIRUS 2 (TAT 6-24 HRS): SARS Coronavirus 2: NEGATIVE

## 2019-04-23 ENCOUNTER — Encounter (HOSPITAL_COMMUNITY)
Admission: RE | Disposition: A | Discharge status: Home / Self Care | Payer: Self-pay | Source: Home / Self Care | Attending: Internal Medicine

## 2019-04-23 ENCOUNTER — Encounter (HOSPITAL_COMMUNITY): Payer: Self-pay | Admitting: Internal Medicine

## 2019-04-23 ENCOUNTER — Ambulatory Visit (HOSPITAL_COMMUNITY)
Admission: RE | Admit: 2019-04-23 | Discharge: 2019-04-23 | Disposition: A | Discharge status: Home / Self Care | Payer: Medicare HMO | Attending: Internal Medicine | Admitting: Internal Medicine

## 2019-04-23 ENCOUNTER — Other Ambulatory Visit: Payer: Self-pay

## 2019-04-23 DIAGNOSIS — Z9109 Other allergy status, other than to drugs and biological substances: Secondary | ICD-10-CM | POA: Insufficient documentation

## 2019-04-23 DIAGNOSIS — Z8249 Family history of ischemic heart disease and other diseases of the circulatory system: Secondary | ICD-10-CM | POA: Insufficient documentation

## 2019-04-23 DIAGNOSIS — K625 Hemorrhage of anus and rectum: Secondary | ICD-10-CM

## 2019-04-23 DIAGNOSIS — Z86010 Personal history of colonic polyps: Secondary | ICD-10-CM | POA: Insufficient documentation

## 2019-04-23 DIAGNOSIS — I130 Hypertensive heart and chronic kidney disease with heart failure and stage 1 through stage 4 chronic kidney disease, or unspecified chronic kidney disease: Secondary | ICD-10-CM | POA: Diagnosis not present

## 2019-04-23 DIAGNOSIS — E7800 Pure hypercholesterolemia, unspecified: Secondary | ICD-10-CM | POA: Insufficient documentation

## 2019-04-23 DIAGNOSIS — Z91010 Allergy to peanuts: Secondary | ICD-10-CM | POA: Diagnosis not present

## 2019-04-23 DIAGNOSIS — Z6829 Body mass index (BMI) 29.0-29.9, adult: Secondary | ICD-10-CM | POA: Insufficient documentation

## 2019-04-23 DIAGNOSIS — K635 Polyp of colon: Secondary | ICD-10-CM

## 2019-04-23 DIAGNOSIS — I428 Other cardiomyopathies: Secondary | ICD-10-CM | POA: Insufficient documentation

## 2019-04-23 DIAGNOSIS — K640 First degree hemorrhoids: Secondary | ICD-10-CM | POA: Diagnosis not present

## 2019-04-23 DIAGNOSIS — Z7982 Long term (current) use of aspirin: Secondary | ICD-10-CM | POA: Insufficient documentation

## 2019-04-23 DIAGNOSIS — Z87891 Personal history of nicotine dependence: Secondary | ICD-10-CM | POA: Diagnosis not present

## 2019-04-23 DIAGNOSIS — Z7984 Long term (current) use of oral hypoglycemic drugs: Secondary | ICD-10-CM | POA: Insufficient documentation

## 2019-04-23 DIAGNOSIS — D472 Monoclonal gammopathy: Secondary | ICD-10-CM | POA: Diagnosis not present

## 2019-04-23 DIAGNOSIS — Z96643 Presence of artificial hip joint, bilateral: Secondary | ICD-10-CM | POA: Insufficient documentation

## 2019-04-23 DIAGNOSIS — K921 Melena: Secondary | ICD-10-CM | POA: Diagnosis not present

## 2019-04-23 DIAGNOSIS — E1122 Type 2 diabetes mellitus with diabetic chronic kidney disease: Secondary | ICD-10-CM | POA: Insufficient documentation

## 2019-04-23 DIAGNOSIS — D124 Benign neoplasm of descending colon: Secondary | ICD-10-CM | POA: Diagnosis not present

## 2019-04-23 DIAGNOSIS — I509 Heart failure, unspecified: Secondary | ICD-10-CM | POA: Insufficient documentation

## 2019-04-23 DIAGNOSIS — E669 Obesity, unspecified: Secondary | ICD-10-CM | POA: Diagnosis not present

## 2019-04-23 DIAGNOSIS — Z833 Family history of diabetes mellitus: Secondary | ICD-10-CM | POA: Insufficient documentation

## 2019-04-23 DIAGNOSIS — K5730 Diverticulosis of large intestine without perforation or abscess without bleeding: Secondary | ICD-10-CM | POA: Diagnosis not present

## 2019-04-23 DIAGNOSIS — Z809 Family history of malignant neoplasm, unspecified: Secondary | ICD-10-CM | POA: Diagnosis not present

## 2019-04-23 DIAGNOSIS — N189 Chronic kidney disease, unspecified: Secondary | ICD-10-CM | POA: Insufficient documentation

## 2019-04-23 HISTORY — PX: COLONOSCOPY: SHX5424

## 2019-04-23 HISTORY — DX: Chronic kidney disease, unspecified: N18.9

## 2019-04-23 LAB — GLUCOSE, CAPILLARY: Glucose-Capillary: 120 mg/dL — ABNORMAL HIGH (ref 70–99)

## 2019-04-23 SURGERY — COLONOSCOPY
Anesthesia: Moderate Sedation

## 2019-04-23 MED ORDER — MIDAZOLAM HCL 5 MG/5ML IJ SOLN
INTRAMUSCULAR | Status: DC | PRN
  Administered 2019-04-23 (×3): 1 mg via INTRAVENOUS
  Administered 2019-04-23: 2 mg via INTRAVENOUS
  Administered 2019-04-23: 1 mg via INTRAVENOUS

## 2019-04-23 MED ORDER — ONDANSETRON HCL 4 MG/2ML IJ SOLN
INTRAMUSCULAR | Status: AC
  Filled 2019-04-23: qty 2

## 2019-04-23 MED ORDER — STERILE WATER FOR IRRIGATION IR SOLN
Status: DC | PRN
  Administered 2019-04-23: 2.5 mL

## 2019-04-23 MED ORDER — ONDANSETRON HCL 4 MG/2ML IJ SOLN
INTRAMUSCULAR | Status: DC | PRN
  Administered 2019-04-23: 4 mg via INTRAVENOUS

## 2019-04-23 MED ORDER — MEPERIDINE HCL 50 MG/ML IJ SOLN
INTRAMUSCULAR | Status: AC
  Filled 2019-04-23: qty 1

## 2019-04-23 MED ORDER — MEPERIDINE HCL 100 MG/ML IJ SOLN
INTRAMUSCULAR | Status: DC | PRN
  Administered 2019-04-23: 10 mg via INTRAVENOUS
  Administered 2019-04-23: 15 mg via INTRAVENOUS
  Administered 2019-04-23: 25 mg via INTRAVENOUS

## 2019-04-23 MED ORDER — SODIUM CHLORIDE 0.9 % IV SOLN: INTRAVENOUS | Status: DC

## 2019-04-23 MED ORDER — MIDAZOLAM HCL 5 MG/5ML IJ SOLN
INTRAMUSCULAR | Status: AC
  Filled 2019-04-23: qty 10

## 2019-04-23 NOTE — Discharge Instructions (Signed)
Colonoscopy Discharge Instructions  Read the instructions outlined below and refer to this sheet in the next few weeks. These discharge instructions provide you with general information on caring for yourself after you leave the hospital. Your doctor may also give you specific instructions. While your treatment has been planned according to the most current medical practices available, unavoidable complications occasionally occur. If you have any problems or questions after discharge, call Dr. Gala Romney at [phone]. ACTIVITY  You may resume your regular activity, but move at a slower pace for the next 24 hours.   Take frequent rest periods for the next 24 hours.   Walking will help get rid of the air and reduce the bloated feeling in your belly (abdomen).   No driving for 24 hours (because of the medicine (anesthesia) used during the test).    Do not sign any important legal documents or operate any machinery for 24 hours (because of the anesthesia used during the test).  NUTRITION  Drink plenty of fluids.   You may resume your normal diet as instructed by your doctor.   Begin with a light meal and progress to your normal diet. Heavy or fried foods are harder to digest and may make you feel sick to your stomach (nauseated).   Avoid alcoholic beverages for 24 hours or as instructed.  MEDICATIONS  You may resume your normal medications unless your doctor tells you otherwise.  WHAT YOU CAN EXPECT TODAY  Some feelings of bloating in the abdomen.   Passage of more gas than usual.   Spotting of blood in your stool or on the toilet paper.  IF YOU HAD POLYPS REMOVED DURING THE COLONOSCOPY:  No aspirin products for 7 days or as instructed.   No alcohol for 7 days or as instructed.   Eat a soft diet for the next 24 hours.  FINDING OUT THE RESULTS OF YOUR TEST Not all test results are available during your visit. If your test results are not back during the visit, make an appointment  with your caregiver to find out the results. Do not assume everything is normal if you have not heard from your caregiver or the medical facility. It is important for you to follow up on all of your test results.  SEEK IMMEDIATE MEDICAL ATTENTION IF:  You have more than a spotting of blood in your stool.   Your belly is swollen (abdominal distention).   You are nauseated or vomiting.   You have a temperature over 101.   You have abdominal pain or discomfort that is severe or gets worse throughout the day.   Hemorrhoid, diverticulosis and colon polyp information provided  Only one small polyp removed today  Little hemorrhoids -more of an aggravation for you than anything else.  Begin Benefiber 1 tablespoon daily x3 weeks; then increase to 2 tablespoons daily thereafter  Pamphlet on hemorrhoid banding.  Further recommendations to follow pending review of pathology report  At patient request, I called Inez Catalina, wife, at [phone].  No answer.  Office follow-up in 4 to 6 weeks-AB   Hemorrhoids Hemorrhoids are swollen veins in and around the rectum or anus. There are two types of hemorrhoids:  Internal hemorrhoids. These occur in the veins that are just inside the rectum. They may poke through to the outside and become irritated and painful.  External hemorrhoids. These occur in the veins that are outside the anus and can be felt as a painful swelling or hard lump near the anus. Most  hemorrhoids do not cause serious problems, and they can be managed with home treatments such as diet and lifestyle changes. If home treatments do not help the symptoms, procedures can be done to shrink or remove the hemorrhoids. What are the causes? This condition is caused by increased pressure in the anal area. This pressure may result from various things, including:  Constipation.  Straining to have a bowel movement.  Diarrhea.  Pregnancy.  Obesity.  Sitting for long periods of time.  Heavy  lifting or other activity that causes you to strain.  Anal sex.  Riding a bike for a long period of time. What are the signs or symptoms? Symptoms of this condition include:  Pain.  Anal itching or irritation.  Rectal bleeding.  Leakage of stool (feces).  Anal swelling.  One or more lumps around the anus. How is this diagnosed? This condition can often be diagnosed through a visual exam. Other exams or tests may also be done, such as:  An exam that involves feeling the rectal area with a gloved hand (digital rectal exam).  An exam of the anal canal that is done using a small tube (anoscope).  A blood test, if you have lost a significant amount of blood.  A test to look inside the colon using a flexible tube with a camera on the end (sigmoidoscopy or colonoscopy). How is this treated? This condition can usually be treated at home. However, various procedures may be done if dietary changes, lifestyle changes, and other home treatments do not help your symptoms. These procedures can help make the hemorrhoids smaller or remove them completely. Some of these procedures involve surgery, and others do not. Common procedures include:  Rubber band ligation. Rubber bands are placed at the base of the hemorrhoids to cut off their blood supply.  Sclerotherapy. Medicine is injected into the hemorrhoids to shrink them.  Infrared coagulation. A type of light energy is used to get rid of the hemorrhoids.  Hemorrhoidectomy surgery. The hemorrhoids are surgically removed, and the veins that supply them are tied off.  Stapled hemorrhoidopexy surgery. The surgeon staples the base of the hemorrhoid to the rectal wall. Follow these instructions at home: Eating and drinking   Eat foods that have a lot of fiber in them, such as whole grains, beans, nuts, fruits, and vegetables.  Ask your health care provider about taking products that have added fiber (fiber supplements).  Reduce the amount  of fat in your diet. You can do this by eating low-fat dairy products, eating less red meat, and avoiding processed foods.  Drink enough fluid to keep your urine pale yellow. Managing pain and swelling   Take warm sitz baths for 20 minutes, 3-4 times a day to ease pain and discomfort. You may do this in a bathtub or using a portable sitz bath that fits over the toilet.  If directed, apply ice to the affected area. Using ice packs between sitz baths may be helpful. ? Put ice in a plastic bag. ? Place a towel between your skin and the bag. ? Leave the ice on for 20 minutes, 2-3 times a day. General instructions  Take over-the-counter and prescription medicines only as told by your health care provider.  Use medicated creams or suppositories as told.  Get regular exercise. Ask your health care provider how much and what kind of exercise is best for you. In general, you should do moderate exercise for at least 30 minutes on most days of the  week (150 minutes each week). This can include activities such as walking, biking, or yoga.  Go to the bathroom when you have the urge to have a bowel movement. Do not wait.  Avoid straining to have bowel movements.  Keep the anal area dry and clean. Use wet toilet paper or moist towelettes after a bowel movement.  Do not sit on the toilet for long periods of time. This increases blood pooling and pain.  Keep all follow-up visits as told by your health care provider. This is important. Contact a health care provider if you have:  Increasing pain and swelling that are not controlled by treatment or medicine.  Difficulty having a bowel movement, or you are unable to have a bowel movement.  Pain or inflammation outside the area of the hemorrhoids. Get help right away if you have:  Uncontrolled bleeding from your rectum. Summary  Hemorrhoids are swollen veins in and around the rectum or anus.  Most hemorrhoids can be managed with home treatments  such as diet and lifestyle changes.  Taking warm sitz baths can help ease pain and discomfort.  In severe cases, procedures or surgery can be done to shrink or remove the hemorrhoids. This information is not intended to replace advice given to you by your health care provider. Make sure you discuss any questions you have with your health care provider. Document Released: 04/14/2000 Document Revised: 04/25/2018 Document Reviewed: 09/06/2017 Elsevier Patient Education  2020 [employer].  Diverticulosis  Diverticulosis is a condition that develops when small pouches (diverticula) form in the wall of the large intestine (colon). The colon is where water is absorbed and stool is formed. The pouches form when the inside layer of the colon pushes through weak spots in the outer layers of the colon. You may have a few pouches or many of them. What are the causes? The cause of this condition is not known. What increases the risk? The following factors may make you more likely to develop this condition:  Being older than age 75. Your risk for this condition increases with age. Diverticulosis is rare among people younger than age 52. By age 76, many people have it.  Eating a low-fiber diet.  Having frequent constipation.  Being overweight.  Not getting enough exercise.  Smoking.  Taking over-the-counter pain medicines, like aspirin and ibuprofen.  Having a family history of diverticulosis. What are the signs or symptoms? In most people, there are no symptoms of this condition. If you do have symptoms, they may include:  Bloating.  Cramps in the abdomen.  Constipation or diarrhea.  Pain in the lower left side of the abdomen. How is this diagnosed? This condition is most often diagnosed during an exam for other colon problems. Because diverticulosis usually has no symptoms, it often cannot be diagnosed independently. This condition may be diagnosed by:  Using a flexible scope to  examine the colon (colonoscopy).  Taking an X-ray of the colon after dye has been put into the colon (barium enema).  Doing a CT scan. How is this treated? You may not need treatment for this condition if you have never developed an infection related to diverticulosis. If you have had an infection before, treatment may include:  Eating a high-fiber diet. This may include eating more fruits, vegetables, and grains.  Taking a fiber supplement.  Taking a live bacteria supplement (probiotic).  Taking medicine to relax your colon.  Taking antibiotic medicines. Follow these instructions at home:  Drink 6-8 glasses  of water or more each day to prevent constipation.  Try not to strain when you have a bowel movement.  If you have had an infection before: ? Eat more fiber as directed by your health care provider or your diet and nutrition specialist (dietitian). ? Take a fiber supplement or probiotic, if your health care provider approves.  Take over-the-counter and prescription medicines only as told by your health care provider.  If you were prescribed an antibiotic, take it as told by your health care provider. Do not stop taking the antibiotic even if you start to feel better.  Keep all follow-up visits as told by your health care provider. This is important. Contact a health care provider if:  You have pain in your abdomen.  You have bloating.  You have cramps.  You have not had a bowel movement in 3 days. Get help right away if:  Your pain gets worse.  Your bloating becomes very bad.  You have a fever or chills, and your symptoms suddenly get worse.  You vomit.  You have bowel movements that are bloody or black.  You have bleeding from your rectum. Summary  Diverticulosis is a condition that develops when small pouches (diverticula) form in the wall of the large intestine (colon).  You may have a few pouches or many of them.  This condition is most often  diagnosed during an exam for other colon problems.  If you have had an infection related to diverticulosis, treatment may include increasing the fiber in your diet, taking supplements, or taking medicines. This information is not intended to replace advice given to you by your health care provider. Make sure you discuss any questions you have with your health care provider. Document Released: 01/13/2004 Document Revised: 03/30/2017 Document Reviewed: 03/06/2016 Elsevier Patient Education  [place].  Colon Polyps  Polyps are tissue growths inside the body. Polyps can grow in many places, including the large intestine (colon). A polyp may be a round bump or a mushroom-shaped growth. You could have one polyp or several. Most colon polyps are noncancerous (benign). However, some colon polyps can become cancerous over time. Finding and removing the polyps early can help prevent this. What are the causes? The exact cause of colon polyps is not known. What increases the risk? You are more likely to develop this condition if you:  Have a family history of colon cancer or colon polyps.  Are older than 50 or older than 45 if you are African American.  Have inflammatory bowel disease, such as ulcerative colitis or Crohn's disease.  Have certain hereditary conditions, such as: ? Familial adenomatous polyposis. ? Lynch syndrome. ? Turcot syndrome. ? Peutz-Jeghers syndrome.  Are overweight.  Smoke cigarettes.  Do not get enough exercise.  Drink too much alcohol.  Eat a diet that is high in fat and red meat and low in fiber.  Had childhood cancer that was treated with abdominal radiation. What are the signs or symptoms? Most polyps do not cause symptoms. If you have symptoms, they may include:  Blood coming from your rectum when having a bowel movement.  Blood in your stool. The stool may look dark red or black.  Abdominal pain.  A change in bowel habits, such as  constipation or diarrhea. How is this diagnosed? This condition is diagnosed with a colonoscopy. This is a procedure in which a lighted, flexible scope is inserted into the anus and then passed into the colon to examine the area.  Polyps are sometimes found when a colonoscopy is done as part of routine cancer screening tests. How is this treated? Treatment for this condition involves removing any polyps that are found. Most polyps can be removed during a colonoscopy. Those polyps will then be tested for cancer. Additional treatment may be needed depending on the results of testing. Follow these instructions at home: Lifestyle  Maintain a healthy weight, or lose weight if recommended by your health care provider.  Exercise every day or as told by your health care provider.  Do not use any products that contain nicotine or tobacco, such as cigarettes and e-cigarettes. If you need help quitting, ask your health care provider.  If you drink alcohol, limit how much you have: ? 0-1 drink a day for women. ? 0-2 drinks a day for men.  Be aware of how much alcohol is in your drink. In the [place] one drink equals one 12 oz bottle of beer (355 mL), one 5 oz glass of wine (148 mL), or one 1 oz shot of hard liquor (44 mL). Eating and drinking   Eat foods that are high in fiber, such as fruits, vegetables, and whole grains.  Eat foods that are high in calcium and vitamin D, such as milk, cheese, yogurt, eggs, liver, fish, and broccoli.  Limit foods that are high in fat, such as fried foods and desserts.  Limit the amount of red meat and processed meat you eat, such as hot dogs, sausage, bacon, and lunch meats. General instructions  Keep all follow-up visits as told by your health care provider. This is important. ? This includes having regularly scheduled colonoscopies. ? Talk to your health care provider about when you need a colonoscopy. Contact a health care provider if:  You have new or  worsening bleeding during a bowel movement.  You have new or increased blood in your stool.  You have a change in bowel habits.  You lose weight for no known reason. Summary  Polyps are tissue growths inside the body. Polyps can grow in many places, including the colon.  Most colon polyps are noncancerous (benign), but some can become cancerous over time.  This condition is diagnosed with a colonoscopy.  Treatment for this condition involves removing any polyps that are found. Most polyps can be removed during a colonoscopy. This information is not intended to replace advice given to you by your health care provider. Make sure you discuss any questions you have with your health care provider. Document Released: 01/12/2004 Document Revised: 08/02/2017 Document Reviewed: 08/02/2017 Elsevier Patient Education  2020 [employer].

## 2019-04-23 NOTE — Op Note (Signed)
[facility] Patient Name: Corey Murphy Procedure Date: 04/23/2019 9:25 AM MRN: [id number] Date of Birth: December 26, 1950 Attending MD: Norvel Richards , MD CSN: [id number] Age: 68 Admit Type: Outpatient Procedure:                Colonoscopy Indications:              Hematochezia Providers:                Norvel Richards, MD, Lurline Del, RN, Aram Candela Referring MD:              Medicines:                Midazolam 6 mg IV, Meperidine 50 mg IV Complications:            No immediate complications. Estimated Blood Loss:     Estimated blood loss was minimal. Procedure:                Pre-Anesthesia Assessment:                           - Prior to the procedure, a History and Physical                            was performed, and patient medications and                            allergies were reviewed. The patient's tolerance of                            previous anesthesia was also reviewed. The risks                            and benefits of the procedure and the sedation                            options and risks were discussed with the patient.                            All questions were answered, and informed consent                            was obtained. Prior Anticoagulants: The patient has                            taken no previous anticoagulant or antiplatelet                            agents. ASA Grade Assessment: II - A patient with                            mild systemic disease. After reviewing the risks  and benefits, the patient was deemed in                            satisfactory condition to undergo the procedure.                           After obtaining informed consent, the colonoscope                            was passed under direct vision. Throughout the                            procedure, the patient's blood pressure, pulse, and                            oxygen saturations were  monitored continuously. The                            [id number] scope was introduced through                            the anus and advanced to the the cecum, identified                            by appendiceal orifice and ileocecal valve. The                            colonoscopy was performed without difficulty. The                            patient tolerated the procedure well. The quality                            of the bowel preparation was adequate. Scope In: 9:58:32 AM Scope Out: 10:15:29 AM Scope Withdrawal Time: 0 hours 14 minutes 8 seconds  Total Procedure Duration: 0 hours 16 minutes 57 seconds  Findings:      The perianal and digital rectal examinations were normal.      Scattered medium-mouthed diverticula were found in the sigmoid colon and       descending colon.      A 5 mm polyp was found in the descending colon. The polyp was sessile.       The polyp was removed with a cold snare. Resection and retrieval were       complete. Estimated blood loss was minimal.      Non-bleeding internal hemorrhoids were found during retroflexion. The       hemorrhoids were mild, small and Grade I (internal hemorrhoids that do       not prolapse).      The exam was otherwise without abnormality on direct and retroflexion       views. Impression:               - Diverticulosis in the sigmoid colon and in the  descending colon.                           - One 5 mm polyp in the descending colon, removed                            with a cold snare. Resected and retrieved.                           - Non-bleeding internal hemorrhoids.                           - The examination was otherwise normal on direct                            and retroflexion views. I suspect benign anal                            rectal bleeding from hemorrhoids Moderate Sedation:      Moderate (conscious) sedation was administered by the endoscopy nurse       and  supervised by the endoscopist. The following parameters were       monitored: oxygen saturation, heart rate, blood pressure, respiratory       rate, EKG, adequacy of pulmonary ventilation, and response to care.       Total physician intraservice time was 25 minutes. Recommendation:           - Patient has a contact number available for                            emergencies. The signs and symptoms of potential                            delayed complications were discussed with the                            patient. Return to normal activities tomorrow.                            Written discharge instructions were provided to the                            patient.                           - Resume previous diet.                           - Continue present medications.                           - Repeat colonoscopy in 5 years for surveillance                            based on pathology results.                           -  Return to GI office in 6 weeks. Begin Benefiber 1                            tablespoon daily for 3 weeks then increase to 2                            tablespoons daily thereafter. Pamphlet on                            hemorrhoid banding provided. Office visit with [place]                            in 6 weeks Procedure Code(s):        --- Professional ---                           [phone], Colonoscopy, flexible; with removal of                            tumor(s), polyp(s), or other lesion(s) by snare                            technique                           99153, Moderate sedation; each additional 15                            minutes intraservice time                           G0500, Moderate sedation services provided by the                            same physician or other qualified health care                            professional performing a gastrointestinal                            endoscopic service that sedation supports,                             requiring the presence of an independent trained                            observer to assist in the monitoring of the                            patient's level of consciousness and physiological                            status; initial 15 minutes of intra-service time;  patient age 86 years or older (additional time may                            be reported with [phone], as appropriate) Diagnosis Code(s):        --- Professional ---                           K64.0, First degree hemorrhoids                           K63.5, Polyp of colon                           K92.1, Melena (includes Hematochezia)                           K57.30, Diverticulosis of large intestine without                            perforation or abscess without bleeding CPT copyright 2019 American [employer] Association. All rights reserved. The codes documented in this report are preliminary and upon coder review may  be revised to meet current compliance requirements. Cristopher Estimable. Avien Taha, MD Norvel Richards, MD 04/23/2019 10:30:01 AM This report has been signed electronically. Number of Addenda: 0

## 2019-04-23 NOTE — H&P (Signed)
@LOGO @   Primary Care Physician:  Redmond School, MD Primary Gastroenterologist:  Dr. Gala Romney  Pre-Procedure History & Physical: HPI:  Corey Murphy is a 68 y.o. male here for new symptoms of intermittent rectal bleeding.  Colonoscopy a little over a year and a half ago demonstrated multiple adenomas and diverticulosis. He is here for a diagnostic colonoscopy.  Past Medical History:  Diagnosis Date  . Alcohol dependency ([place])   . CHF (congestive heart failure) ([place])   . Chronic kidney disease   . Colonic adenoma 2006  . Essential hypertension, benign   . History of cardiac catheterization    03/2013 LM nl, LAD nl, D1/2/3 small - nl, LCX nondom - nl, OM1/2/3 nl, RCA dom  . Hypercholesteremia   . Hypertensive heart disease   . 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 Nonischemic cardiomyopathy (H[facility])    LVEF approximately 40% 06/2013 - improved to normal range  . Obesity   . Type 2 diabetes mellitus ([place])     Past Surgical History:  Procedure Laterality Date  . CIRCUMCISION    . COLONOSCOPY  05/20/2004   RMR: Internal hemorrhoids.  Diminutive adenomatous polyp in the mid-right colon, otherwise normal  . COLONOSCOPY N/A 06/19/2012   Procedure: COLONOSCOPY;  Surgeon: Daneil Dolin, MD;  [facility];  Service: Endoscopy;  Laterality: N/A;  8:30AM-rescheduled 10:30am Darius Bump notified pt  . COLONOSCOPY N/A 08/08/2017   Surgeon: Daneil Dolin, MD;  diverticulosis in sigmoid, descending, and transverse colon, three 4-7 mm polyps in ascending colon, and one 9 mm polyp in ascending colon. Pathology with tubular adenomas. Repeat in 3 years.  . Ileocolonoscopy  06/28/2007   [id number] rectum/Submucosal sigmoid diverticula (chronic), doubt clinical significance Hyperplastic polypoid-appearing fold, mid ascending colon, status/Remainder colonic mucosa and terminal ileum mucosa appeared normal   . LEFT AND RIGHT HEART CATHETERIZATION WITH CORONARY ANGIOGRAM  03/19/2013    Procedure: LEFT AND RIGHT HEART CATHETERIZATION WITH CORONARY ANGIOGRAM;  Surgeon: Wellington Hampshire, MD;  Location: [facility];  Service: Cardiovascular;;  . POLYPECTOMY  08/08/2017   Procedure: POLYPECTOMY;  Surgeon: Daneil Dolin, MD;  [facility];  Service: Endoscopy;;  ascending colon polyps x4 (cold snare-3,hot snare)  . TOTAL HIP ARTHROPLASTY  2010   Left  . TOTAL HIP ARTHROPLASTY  2010   Right    Prior to Admission medications   Medication Sig Start Date End Date Taking? Authorizing Provider  aspirin 81 MG tablet Take 81 mg by mouth daily.   Yes [provider]  carvedilol (COREG) 12.5 MG tablet TAKE 1 TABLET BY MOUTH 2 TIMES DAILY WITH A MEAL. Patient taking differently: Take 12.5 mg by mouth 2 (two) times daily with a meal.  03/05/19  Yes Satira Sark, MD  furosemide (LASIX) 20 MG tablet Take 1 tablet (20 mg total) by mouth daily. 01/17/19  Yes Satira Sark, MD  glimepiride (AMARYL) 2 MG tablet Take 2 mg by mouth 2 (two) times a day.    Yes [provider]  HYDROcodone-acetaminophen (NORCO) 10-325 MG tablet Take 1 tablet by mouth every 6 (six) hours as needed for moderate pain.    Yes [provider]  lisinopril (PRINIVIL,ZESTRIL) 2.5 MG tablet Take 2.5 mg by mouth daily.   Yes [provider]  Multiple Vitamins-Iron (MULTIVITAMIN/IRON PO) Take 1 tablet by mouth daily.    Yes [provider]  polyethylene glycol-electrolytes (TRILYTE) 420 g solution Take 4,000 mLs by mouth as directed. 02/18/19  Yes Riniyah Speich, Cristopher Estimable, MD  simvastatin (ZOCOR) 20 MG tablet Take 20 mg by mouth daily.  10/14/15  Yes [provider]  sitaGLIPtin (JANUVIA) 50 MG tablet Take 50 mg by mouth daily.   Yes [provider]  spironolactone (ALDACTONE) 25 MG tablet TAKE 1/2 TABLET BY MOUTH DAILY Patient taking differently: Take 12.5 mg by mouth daily. TAKE 1/2 TABLET BY MOUTH DAILY 09/09/18  Yes Satira Sark, MD   hydrocortisone (ANUSOL-HC) 2.5 % rectal cream Place 1 application rectally 2 (two) times daily. Patient not taking: Reported on 04/14/2019 02/17/19   Erenest Rasher, PA-C    Allergies as of 02/18/2019 - Review Complete 02/17/2019  Allergen Reaction Noted  . Other  11/28/2018  . Peanut-containing drug products Rash 02/17/2019    Family History  Problem Relation Age of Onset  . Heart failure Mother   . Diabetes Mother   . Hypertension Mother   . Heart failure Sister   . Hypertension Sister   . Cancer Sister   . Colon cancer Neg Hx     Social History   Socioeconomic History  . Marital status: Married    Spouse name: Not on file  . Number of children: 2  . Years of education: Not on file  . Highest education level: Not on file  Occupational History  . Occupation: Retired, Arts development officer  . Smoking status: Former Smoker    Packs/day: 1.00    Years: 12.00    Pack years: 12.00    Types: Cigarettes    Quit date: 05/28/2008    Years since quitting: 10.9  . Smokeless tobacco: Never Used  Substance and Sexual Activity  . Alcohol use: Yes    Comment: Occasionally 2 to 3 times a month  . Drug use: Yes    Types: Marijuana    Comment: Smoked via a bowl occassionally.   . Sexual activity: Not on file  Other Topics Concern  . Not on file  Social History Narrative  . Not on file   Social Determinants of Health   Financial Resource Strain:   . Difficulty of Paying Living Expenses: Not on file  Food Insecurity:   . Worried About Charity fundraiser in the Last Year: Not on file  . Ran Out of Food in the Last Year: Not on file  Transportation Needs:   . Lack of Transportation (Medical): Not on file  . Lack of Transportation (Non-Medical): Not on file  Physical Activity:   . Days of Exercise per Week: Not on file  . Minutes of Exercise per Session: Not on file  Stress:   . Feeling of Stress : Not on file  Social Connections:   . Frequency of Communication  with Friends and Family: Not on file  . Frequency of Social Gatherings with Friends and Family: Not on file  . Attends Religious Services: Not on file  . Active Member of Clubs or Organizations: Not on file  . Attends Archivist Meetings: Not on file  . Marital Status: Not on file  Intimate Partner Violence:   . Fear of Current or Ex-Partner: Not on file  . Emotionally Abused: Not on file  . Physically Abused: Not on file  . Sexually Abused: Not on file    Review of Systems: See HPI, otherwise negative ROS  Physical Exam: BP (!) 154/90   Pulse 75   Temp 98.4 F (36.9 C) (Oral)   Resp 16   Ht 5'  8" (1.727 m)   Wt 88.5 kg   SpO2 100%   BMI 29.65 kg/m  General:   Alert,  Well-developed, well-nourished, pleasant and cooperative in NAD Neck:  Supple; no masses or thyromegaly. No significant cervical adenopathy. Lungs:  Clear throughout to auscultation.   No wheezes, crackles, or rhonchi. No acute distress. Heart:  Regular rate and rhythm; no murmurs, clicks, rubs,  or gallops. Abdomen: Non-distended, normal bowel sounds.  Soft and nontender without appreciable mass or hepatosplenomegaly.  Pulses:  Normal pulses noted. Extremities:  Without clubbing or edema.  Impression/Plan: 68 year old gent with rectal bleeding.  History of colonic adenoma.  Here for colonoscopy for diagnostic purposes  The risks, benefits, limitations, alternatives and imponderables have been reviewed with the patient. Questions have been answered. All parties are agreeable.      Notice: This dictation was prepared with Dragon dictation along with smaller phrase technology. Any transcriptional errors that result from this process are unintentional and may not be corrected upon review.

## 2019-04-24 ENCOUNTER — Encounter: Payer: Self-pay | Admitting: Internal Medicine

## 2019-04-24 LAB — SURGICAL PATHOLOGY

## 2019-05-01 DIAGNOSIS — I5032 Chronic diastolic (congestive) heart failure: Secondary | ICD-10-CM | POA: Diagnosis not present

## 2019-05-01 DIAGNOSIS — E1122 Type 2 diabetes mellitus with diabetic chronic kidney disease: Secondary | ICD-10-CM | POA: Diagnosis not present

## 2019-05-01 DIAGNOSIS — I130 Hypertensive heart and chronic kidney disease with heart failure and stage 1 through stage 4 chronic kidney disease, or unspecified chronic kidney disease: Secondary | ICD-10-CM | POA: Diagnosis not present

## 2019-05-01 DIAGNOSIS — Z7984 Long term (current) use of oral hypoglycemic drugs: Secondary | ICD-10-CM | POA: Diagnosis not present

## 2019-06-03 ENCOUNTER — Ambulatory Visit: Payer: Medicare HMO | Admitting: Gastroenterology

## 2019-06-03 ENCOUNTER — Other Ambulatory Visit: Payer: Self-pay

## 2019-06-03 ENCOUNTER — Encounter: Payer: Self-pay | Admitting: Gastroenterology

## 2019-06-03 VITALS — BP 161/100 | HR 65 | Temp 97.5°F | Ht 67.0 in | Wt 214.8 lb

## 2019-06-03 DIAGNOSIS — K625 Hemorrhage of anus and rectum: Secondary | ICD-10-CM | POA: Diagnosis not present

## 2019-06-03 NOTE — Patient Instructions (Signed)
Your next colonoscopy will be in 2025.  Please call me if you have worsening symptoms of rectal bleeding, soiling, any itching, burning, discomfort. You would be a good candidate for hemorrhoid banding if you would like to pursue this in the future.  I recommend taking Benefiber or generic equivalent daily. Start with 1-2 teaspoons a day and increase as tolerated. This will help with more productive bowel movements and likely decrease seepage.  We will see you back as needed!   It was a pleasure to see you today. I want to create trusting relationships with patients to provide genuine, compassionate, and quality care. I value your feedback. If you receive a survey regarding your visit,  I greatly appreciate you taking time to fill this out.   Annitta Needs, PhD, ANP-BC [facility]

## 2019-06-03 NOTE — Progress Notes (Signed)
Referring Provider: Redmond School, MD Primary Care Physician:  Redmond School, MD Primary GI: Dr. Gala Romney   Chief Complaint  Patient presents with  . Follow-up  . Rectal Bleeding    seldom, /tcs 04/2019    HPI:   Corey Murphy is a 69 y.o. male presenting today with a history of adenomas, early interval colonoscopy completed in interim from last appointment due to rectal bleeding. Due to history of adenomas, he will need surveillance in 2025. Internal hemorrhoids noted at time of colonoscopy. Returning today for follow-up.   Rectal bleeding now sporadic. Much improved. No burning, itching. No rectal pain. Occasional fecal seepage. No constipation. Occasional straining but not a lot. Limited toilet time. No fiber supplementation. Does not want to pursue hemorrhoid banding at the moment.     Past Medical History:  Diagnosis Date  . Alcohol dependency ([place])   . CHF (congestive heart failure) ([place])   . Chronic kidney disease   . Colonic adenoma 2006  . Essential hypertension, benign   . History of cardiac catheterization    03/2013 LM nl, LAD nl, D1/2/3 small - nl, LCX nondom - nl, OM1/2/3 nl, RCA dom  . Hypercholesteremia   . Hypertensive heart disease   . 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 Nonischemic cardiomyopathy (HCC)    LVEF approximately 40% 06/2013 - improved to normal range  . Obesity   . Type 2 diabetes mellitus ([place])     Past Surgical History:  Procedure Laterality Date  . CIRCUMCISION    . COLONOSCOPY  05/20/2004   RMR: Internal hemorrhoids.  Diminutive adenomatous polyp in the mid-right colon, otherwise normal  . COLONOSCOPY N/A 06/19/2012   Procedure: COLONOSCOPY;  Surgeon: Daneil Dolin, MD;  [facility];  Service: Endoscopy;  Laterality: N/A;  8:30AM-rescheduled 10:30am Darius Bump notified pt  . COLONOSCOPY N/A 08/08/2017   Surgeon: Daneil Dolin, MD;  diverticulosis in sigmoid, descending, and transverse colon, three 4-7 mm  polyps in ascending colon, and one 9 mm polyp in ascending colon. Pathology with tubular adenomas. Repeat in 3 years.  . COLONOSCOPY N/A 04/23/2019   Sigmoid and descending colon diverticulosis, one 5 mm polyp (tubular adenoma) in descending colon, non-bleeding internal hemorrhoids. 5 year surveillance.   . Ileocolonoscopy  06/28/2007   [id number] rectum/Submucosal sigmoid diverticula (chronic), doubt clinical significance Hyperplastic polypoid-appearing fold, mid ascending colon, status/Remainder colonic mucosa and terminal ileum mucosa appeared normal   . LEFT AND RIGHT HEART CATHETERIZATION WITH CORONARY ANGIOGRAM  03/19/2013   Procedure: LEFT AND RIGHT HEART CATHETERIZATION WITH CORONARY ANGIOGRAM;  Surgeon: Wellington Hampshire, MD;  Location: [facility];  Service: Cardiovascular;;  . POLYPECTOMY  08/08/2017   Procedure: POLYPECTOMY;  Surgeon: Daneil Dolin, MD;  [facility];  Service: Endoscopy;;  ascending colon polyps x4 (cold snare-3,hot snare)  . TOTAL HIP ARTHROPLASTY  2010   Left  . TOTAL HIP ARTHROPLASTY  2010   Right    Current Outpatient Medications  Medication Sig Dispense Refill  . aspirin 81 MG tablet Take 81 mg by mouth daily.    . carvedilol (COREG) 12.5 MG tablet TAKE 1 TABLET BY MOUTH 2 TIMES DAILY WITH A MEAL. (Patient taking differently: Take 12.5 mg by mouth 2 (two) times daily with a meal. ) 180 tablet 3  . furosemide (LASIX) 20 MG tablet Take 1 tablet (20 mg total) by mouth daily. 90 tablet 3  . glimepiride (AMARYL) 2 MG tablet Take 2 mg  by mouth 2 (two) times a day.     Marland Kitchen HYDROcodone-acetaminophen (NORCO) 10-325 MG tablet Take 1 tablet by mouth every 6 (six) hours as needed for moderate pain.     Marland Kitchen lisinopril (PRINIVIL,ZESTRIL) 2.5 MG tablet Take 2.5 mg by mouth daily.    . Multiple Vitamins-Iron (MULTIVITAMIN/IRON PO) Take 1 tablet by mouth daily.     . simvastatin (ZOCOR) 20 MG tablet Take 20 mg by mouth daily.   3  . sitaGLIPtin (JANUVIA) 50 MG tablet  Take 50 mg by mouth daily.    Marland Kitchen spironolactone (ALDACTONE) 25 MG tablet TAKE 1/2 TABLET BY MOUTH DAILY (Patient taking differently: Take 12.5 mg by mouth daily. TAKE 1/2 TABLET BY MOUTH DAILY) 135 tablet 2   No current facility-administered medications for this visit.    Allergies as of 06/03/2019 - Review Complete 06/03/2019  Allergen Reaction Noted  . Other  11/28/2018  . Peanut-containing drug products Rash 02/17/2019    Family History  Problem Relation Age of Onset  . Heart failure Mother   . Diabetes Mother   . Hypertension Mother   . Heart failure Sister   . Hypertension Sister   . Cancer Sister   . Colon cancer Neg Hx     Social History   Socioeconomic History  . Marital status: Married    Spouse name: Not on file  . Number of children: 2  . Years of education: Not on file  . Highest education level: Not on file  Occupational History  . Occupation: Retired, Arts development officer  . Smoking status: Former Smoker    Packs/day: 1.00    Years: 12.00    Pack years: 12.00    Types: Cigarettes    Quit date: 05/28/2008    Years since quitting: 11.0  . Smokeless tobacco: Never Used  Substance and Sexual Activity  . Alcohol use: Yes    Comment: Occasionally 2 to 3 times a month  . Drug use: Yes    Types: Marijuana    Comment: Smoked via a bowl occassionally.   . Sexual activity: Not on file  Other Topics Concern  . Not on file  Social History Narrative  . Not on file   Social Determinants of Health   Financial Resource Strain:   . Difficulty of Paying Living Expenses: Not on file  Food Insecurity:   . Worried About Charity fundraiser in the Last Year: Not on file  . Ran Out of Food in the Last Year: Not on file  Transportation Needs:   . Lack of Transportation (Medical): Not on file  . Lack of Transportation (Non-Medical): Not on file  Physical Activity:   . Days of Exercise per Week: Not on file  . Minutes of Exercise per Session: Not on file    Stress:   . Feeling of Stress : Not on file  Social Connections:   . Frequency of Communication with Friends and Family: Not on file  . Frequency of Social Gatherings with Friends and Family: Not on file  . Attends Religious Services: Not on file  . Active Member of Clubs or Organizations: Not on file  . Attends Archivist Meetings: Not on file  . Marital Status: Not on file    Review of Systems: Gen: Denies fever, chills, anorexia. Denies fatigue, weakness, weight loss.  CV: Denies chest pain, palpitations, syncope, peripheral edema, and claudication. Resp: Denies dyspnea at rest, cough, wheezing, coughing up blood, and pleurisy. GI: see  HPI  Derm: Denies rash, itching, dry skin Psych: Denies depression, anxiety, memory loss, confusion. No homicidal or suicidal ideation.  Heme: see HPI  Physical Exam: BP (!) 161/100   Pulse 65   Temp (!) 97.5 F (36.4 C) (Oral)   Ht 5\' 7"  (1.702 m)   Wt 214 lb 12.8 oz (97.4 kg)   BMI 33.64 kg/m  General:   Alert and oriented. No distress noted. Pleasant and cooperative.  Head:  Normocephalic and atraumatic. Eyes:  Conjuctiva clear without scleral icterus. Heart: S1 S2 present without murmurs  Abdomen:  +BS, soft, non-tender and non-distended. No rebound or guarding. No HSM or masses noted. Small umbilical hernia Msk:  Symmetrical without gross deformities. Normal posture. Extremities:  Without edema. Neurologic:  Alert and  oriented x4 Psych:  Alert and cooperative. Normal mood and affect.  ASSESSMENT: Hatim Kuecker is a 69 y.o. male presenting today with history of rectal bleeding due to internal hemorrhoids, history of adenomas with need for surveillance in 2025, now noting improvement in symptoms overall.  From description, likely dealing with Grade 1-2 hemorrhoids. Some fecal seepage noted in setting of hemorrhoids. I discussed in detail outpatient hemorrhoid banding, as he would be a good candidate for this. He is declining  this currently but will call us if he wants to pursue. Recommend additional fiber supplementation daily.    PLAN:   Benefiber daily  Call if desire to pursue hemorrhoid banding  Return prn  Next colonoscopy 2025  Annitta Needs, PhD, ANP-BC [facility]

## 2019-06-03 NOTE — Progress Notes (Signed)
Cc'ed to pcp °

## 2019-06-04 ENCOUNTER — Inpatient Hospital Stay (HOSPITAL_COMMUNITY): Payer: Medicare HMO | Attending: Hematology

## 2019-06-04 ENCOUNTER — Ambulatory Visit (HOSPITAL_COMMUNITY)
Admission: RE | Admit: 2019-06-04 | Discharge: 2019-06-04 | Disposition: A | Discharge status: Home / Self Care | Payer: Medicare HMO | Source: Ambulatory Visit | Attending: Hematology | Admitting: Hematology

## 2019-06-04 DIAGNOSIS — E1122 Type 2 diabetes mellitus with diabetic chronic kidney disease: Secondary | ICD-10-CM | POA: Diagnosis not present

## 2019-06-04 DIAGNOSIS — Z7982 Long term (current) use of aspirin: Secondary | ICD-10-CM | POA: Insufficient documentation

## 2019-06-04 DIAGNOSIS — I130 Hypertensive heart and chronic kidney disease with heart failure and stage 1 through stage 4 chronic kidney disease, or unspecified chronic kidney disease: Secondary | ICD-10-CM | POA: Diagnosis not present

## 2019-06-04 DIAGNOSIS — C9000 Multiple myeloma not having achieved remission: Secondary | ICD-10-CM | POA: Diagnosis not present

## 2019-06-04 DIAGNOSIS — N189 Chronic kidney disease, unspecified: Secondary | ICD-10-CM | POA: Diagnosis not present

## 2019-06-04 DIAGNOSIS — E7800 Pure hypercholesterolemia, unspecified: Secondary | ICD-10-CM | POA: Diagnosis not present

## 2019-06-04 DIAGNOSIS — I509 Heart failure, unspecified: Secondary | ICD-10-CM | POA: Diagnosis not present

## 2019-06-04 DIAGNOSIS — Z7984 Long term (current) use of oral hypoglycemic drugs: Secondary | ICD-10-CM | POA: Insufficient documentation

## 2019-06-04 DIAGNOSIS — Z79899 Other long term (current) drug therapy: Secondary | ICD-10-CM | POA: Diagnosis not present

## 2019-06-04 DIAGNOSIS — D472 Monoclonal gammopathy: Secondary | ICD-10-CM

## 2019-06-04 DIAGNOSIS — Z87891 Personal history of nicotine dependence: Secondary | ICD-10-CM | POA: Diagnosis not present

## 2019-06-04 LAB — CBC WITH DIFFERENTIAL/PLATELET
Abs Immature Granulocytes: 0.01 10*3/uL (ref 0.00–0.07)
Basophils Absolute: 0 10*3/uL (ref 0.0–0.1)
Basophils Relative: 1 %
Eosinophils Absolute: 0.1 10*3/uL (ref 0.0–0.5)
Eosinophils Relative: 1 %
HCT: 43.2 % (ref 39.0–52.0)
Hemoglobin: 13.9 g/dL (ref 13.0–17.0)
Immature Granulocytes: 0 %
Lymphocytes Relative: 35 %
Lymphs Abs: 2.1 10*3/uL (ref 0.7–4.0)
MCH: 30.7 pg (ref 26.0–34.0)
MCHC: 32.2 g/dL (ref 30.0–36.0)
MCV: 95.4 fL (ref 80.0–100.0)
Monocytes Absolute: 0.6 10*3/uL (ref 0.1–1.0)
Monocytes Relative: 9 %
Neutro Abs: 3.3 10*3/uL (ref 1.7–7.7)
Neutrophils Relative %: 54 %
Platelets: 271 10*3/uL (ref 150–400)
RBC: 4.53 MIL/uL (ref 4.22–5.81)
RDW: 13.6 % (ref 11.5–15.5)
WBC: 6.1 10*3/uL (ref 4.0–10.5)
nRBC: 0 % (ref 0.0–0.2)

## 2019-06-04 LAB — COMPREHENSIVE METABOLIC PANEL
ALT: 30 U/L (ref 0–44)
AST: 20 U/L (ref 15–41)
Albumin: 3.9 g/dL (ref 3.5–5.0)
Alkaline Phosphatase: 84 U/L (ref 38–126)
Anion gap: 10 (ref 5–15)
BUN: 22 mg/dL (ref 8–23)
CO2: 26 mmol/L (ref 22–32)
Calcium: 9.7 mg/dL (ref 8.9–10.3)
Chloride: 96 mmol/L — ABNORMAL LOW (ref 98–111)
Creatinine, Ser: 1.53 mg/dL — ABNORMAL HIGH (ref 0.61–1.24)
GFR calc Af Amer: 54 mL/min — ABNORMAL LOW (ref >60)
GFR calc non Af Amer: 47 mL/min — ABNORMAL LOW (ref >60)
Glucose, Bld: 218 mg/dL — ABNORMAL HIGH (ref 70–99)
Potassium: 4.3 mmol/L (ref 3.5–5.1)
Sodium: 132 mmol/L — ABNORMAL LOW (ref 135–145)
Total Bilirubin: 0.9 mg/dL (ref 0.3–1.2)
Total Protein: 7.7 g/dL (ref 6.5–8.1)

## 2019-06-04 LAB — LACTATE DEHYDROGENASE: LDH: 94 U/L — ABNORMAL LOW (ref 98–192)

## 2019-06-05 LAB — KAPPA/LAMBDA LIGHT CHAINS
Kappa free light chain: 21.5 mg/L — ABNORMAL HIGH (ref 3.3–19.4)
Kappa, lambda light chain ratio: 0.35 (ref 0.26–1.65)
Lambda free light chains: 61.1 mg/L — ABNORMAL HIGH (ref 5.7–26.3)

## 2019-06-05 LAB — PROTEIN ELECTROPHORESIS, SERUM
A/G Ratio: 1 (ref 0.7–1.7)
Albumin ELP: 3.8 g/dL (ref 2.9–4.4)
Alpha-1-Globulin: 0.2 g/dL (ref 0.0–0.4)
Alpha-2-Globulin: 0.7 g/dL (ref 0.4–1.0)
Beta Globulin: 1.2 g/dL (ref 0.7–1.3)
Gamma Globulin: 1.6 g/dL (ref 0.4–1.8)
Globulin, Total: 3.7 g/dL (ref 2.2–3.9)
M-Spike, %: 1.2 g/dL — ABNORMAL HIGH
Total Protein ELP: 7.5 g/dL (ref 6.0–8.5)

## 2019-06-11 ENCOUNTER — Inpatient Hospital Stay (HOSPITAL_COMMUNITY): Payer: Medicare HMO | Admitting: Hematology

## 2019-06-11 ENCOUNTER — Encounter (HOSPITAL_COMMUNITY): Payer: Self-pay | Admitting: Hematology

## 2019-06-11 ENCOUNTER — Other Ambulatory Visit: Payer: Self-pay

## 2019-06-11 VITALS — BP 118/73 | HR 72 | Temp 97.1°F | Resp 18 | Wt 204.7 lb

## 2019-06-11 DIAGNOSIS — N189 Chronic kidney disease, unspecified: Secondary | ICD-10-CM | POA: Diagnosis not present

## 2019-06-11 DIAGNOSIS — Z87891 Personal history of nicotine dependence: Secondary | ICD-10-CM | POA: Diagnosis not present

## 2019-06-11 DIAGNOSIS — E1122 Type 2 diabetes mellitus with diabetic chronic kidney disease: Secondary | ICD-10-CM | POA: Diagnosis not present

## 2019-06-11 DIAGNOSIS — D472 Monoclonal gammopathy: Secondary | ICD-10-CM

## 2019-06-11 DIAGNOSIS — Z79899 Other long term (current) drug therapy: Secondary | ICD-10-CM | POA: Diagnosis not present

## 2019-06-11 DIAGNOSIS — I130 Hypertensive heart and chronic kidney disease with heart failure and stage 1 through stage 4 chronic kidney disease, or unspecified chronic kidney disease: Secondary | ICD-10-CM | POA: Diagnosis not present

## 2019-06-11 DIAGNOSIS — C9000 Multiple myeloma not having achieved remission: Secondary | ICD-10-CM

## 2019-06-11 DIAGNOSIS — Z7982 Long term (current) use of aspirin: Secondary | ICD-10-CM | POA: Diagnosis not present

## 2019-06-11 DIAGNOSIS — I509 Heart failure, unspecified: Secondary | ICD-10-CM | POA: Diagnosis not present

## 2019-06-11 DIAGNOSIS — Z7984 Long term (current) use of oral hypoglycemic drugs: Secondary | ICD-10-CM | POA: Diagnosis not present

## 2019-06-11 DIAGNOSIS — E7800 Pure hypercholesterolemia, unspecified: Secondary | ICD-10-CM | POA: Diagnosis not present

## 2019-06-11 NOTE — Progress Notes (Signed)
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 male seen for follow-up and further evaluation of smoldering multiple myeloma.  Appetite and energy levels are 100%.  Denies any new onset bone pains.  No other pains reported.  No fevers, night sweats or weight loss in the last 6 months.  REVIEW OF SYSTEMS:  Review of Systems  All other systems reviewed and are negative.    PAST MEDICAL/SURGICAL HISTORY:  Past Medical History:  Diagnosis Date  . Alcohol dependency ([place])   . CHF (congestive heart failure) ([place])   . Chronic kidney disease   . Colonic adenoma 2006  . Essential hypertension, benign   . History of cardiac catheterization    03/2013 LM nl, LAD nl, D1/2/3 small - nl, LCX nondom - nl, OM1/2/3 nl, RCA dom  . Hypercholesteremia   . Hypertensive heart disease   . 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 Nonischemic cardiomyopathy (HCC)    LVEF approximately 40% 06/2013 - improved to normal range  . Obesity   . Type 2 diabetes mellitus ([place])    Past Surgical History:  Procedure Laterality Date  . CIRCUMCISION    . COLONOSCOPY  05/20/2004   RMR: Internal hemorrhoids.  Diminutive adenomatous polyp in the mid-right colon, otherwise normal  . COLONOSCOPY N/A 06/19/2012   Procedure: COLONOSCOPY;  Surgeon: Daneil Dolin, MD;  [facility];  Service: Endoscopy;  Laterality: N/A;  8:30AM-rescheduled 10:30am Darius Bump notified pt  . COLONOSCOPY N/A 08/08/2017   Surgeon: Daneil Dolin, MD;  diverticulosis in sigmoid, descending, and transverse colon, three 4-7 mm polyps in ascending colon, and one 9 mm polyp in ascending colon. Pathology with tubular adenomas. Repeat in 3  years.  . COLONOSCOPY N/A 04/23/2019   Sigmoid and descending colon diverticulosis, one 5 mm polyp (tubular adenoma) in descending colon, non-bleeding internal hemorrhoids. 5 year surveillance.   . Ileocolonoscopy  06/28/2007   [id number] rectum/Submucosal sigmoid diverticula (chronic), doubt clinical significance Hyperplastic polypoid-appearing fold, mid ascending colon, status/Remainder colonic mucosa and terminal ileum mucosa appeared normal   . LEFT AND RIGHT HEART CATHETERIZATION WITH CORONARY ANGIOGRAM  03/19/2013   Procedure: LEFT AND RIGHT HEART CATHETERIZATION WITH CORONARY ANGIOGRAM;  Surgeon: Wellington Hampshire, MD;  Location: [facility] CATH LAB;  Service: Cardiovascular;;  . POLYPECTOMY  08/08/2017   Procedure: POLYPECTOMY;  Surgeon: Daneil Dolin, MD;  [facility];  Service: Endoscopy;;  ascending colon polyps x4 (cold snare-3,hot snare)  . TOTAL HIP ARTHROPLASTY  2010   Left  . TOTAL HIP ARTHROPLASTY  2010   Right     SOCIAL HISTORY:  Social History   Socioeconomic History  . Marital status: Married    Spouse name: Not on file  . Number of children: 2  . Years of education: Not on file  . Highest education level: Not on file  Occupational History  . Occupation: Retired, Arts development officer  . Smoking status: Former Smoker    Packs/day: 1.00    Years: 12.00    Pack years: 12.00    Types: Cigarettes    Quit date: 05/28/2008    Years since quitting: 11.0  . Smokeless tobacco: Never Used  Substance and Sexual Activity  .  Alcohol use: Yes    Comment: Occasionally 2 to 3 times a month  . Drug use: Yes    Types: Marijuana    Comment: Smoked via a bowl occassionally.   . Sexual activity: Not on file  Other Topics Concern  . Not on file  Social History Narrative  . Not on file   Social Determinants of Health   Financial Resource Strain:   . Difficulty of Paying Living Expenses: Not on file  Food Insecurity:   . Worried About Charity fundraiser in  the Last Year: Not on file  . Ran Out of Food in the Last Year: Not on file  Transportation Needs:   . Lack of Transportation (Medical): Not on file  . Lack of Transportation (Non-Medical): Not on file  Physical Activity:   . Days of Exercise per Week: Not on file  . Minutes of Exercise per Session: Not on file  Stress:   . Feeling of Stress : Not on file  Social Connections:   . Frequency of Communication with Friends and Family: Not on file  . Frequency of Social Gatherings with Friends and Family: Not on file  . Attends Religious Services: Not on file  . Active Member of Clubs or Organizations: Not on file  . Attends Archivist Meetings: Not on file  . Marital Status: Not on file  Intimate Partner Violence:   . Fear of Current or Ex-Partner: Not on file  . Emotionally Abused: Not on file  . Physically Abused: Not on file  . Sexually Abused: Not on file    FAMILY HISTORY:  Family History  Problem Relation Age of Onset  . Heart failure Mother   . Diabetes Mother   . Hypertension Mother   . Heart failure Sister   . Hypertension Sister   . Cancer Sister   . Colon cancer Neg Hx     CURRENT MEDICATIONS:  Outpatient Encounter Medications as of 06/11/2019  Medication Sig Note  . aspirin 81 MG tablet Take 81 mg by mouth daily.   . carvedilol (COREG) 12.5 MG tablet TAKE 1 TABLET BY MOUTH 2 TIMES DAILY WITH A MEAL. (Patient taking differently: Take 12.5 mg by mouth 2 (two) times daily with a meal. )   . furosemide (LASIX) 20 MG tablet Take 1 tablet (20 mg total) by mouth daily.   Marland Kitchen glimepiride (AMARYL) 2 MG tablet Take 2 mg by mouth 2 (two) times a day.  04/23/2019: One half dose taken  . lisinopril (PRINIVIL,ZESTRIL) 2.5 MG tablet Take 2.5 mg by mouth daily.   . Multiple Vitamins-Iron (MULTIVITAMIN/IRON PO) Take 1 tablet by mouth daily.    . simvastatin (ZOCOR) 20 MG tablet Take 20 mg by mouth daily.    Marland Kitchen spironolactone (ALDACTONE) 25 MG tablet TAKE 1/2 TABLET BY MOUTH  DAILY (Patient taking differently: Take 12.5 mg by mouth daily. TAKE 1/2 TABLET BY MOUTH DAILY)   . HYDROcodone-acetaminophen (NORCO) 10-325 MG tablet Take 1 tablet by mouth every 6 (six) hours as needed for moderate pain.    . [DISCONTINUED] GAVILYTE-C 240 g solution Take 4,000 mLs by mouth as directed.   . [DISCONTINUED] sitaGLIPtin (JANUVIA) 50 MG tablet Take 50 mg by mouth daily. 04/23/2019: One half dose taken   No facility-administered encounter medications on file as of 06/11/2019.    ALLERGIES:  Allergies  Allergen Reactions  . Other   . Peanut-Containing Drug Products Rash    *Cashews     PHYSICAL EXAM:  ECOG Performance status: 1  Vitals:   06/11/19 1424  BP: 118/73  Pulse: 72  Resp: 18  Temp: (!) 97.1 F (36.2 C)  SpO2: 98%   Filed Weights   06/11/19 1424  Weight: 204 lb 11.2 oz (92.9 kg)    Physical Exam Constitutional:      Appearance: Normal appearance. He is normal weight.  Cardiovascular:     Rate and Rhythm: Normal rate and regular rhythm.     Heart sounds: Normal heart sounds.  Pulmonary:     Effort: Pulmonary effort is normal.     Breath sounds: Normal breath sounds.  Abdominal:     General: There is no distension.     Palpations: Abdomen is soft. There is no mass.  Musculoskeletal:        General: Normal range of motion.  Skin:    General: Skin is warm and dry.  Neurological:     Mental Status: He is alert and oriented to person, place, and time. Mental status is at baseline.  Psychiatric:        Mood and Affect: Mood normal.        Behavior: Behavior normal.      LABORATORY DATA:  I have reviewed the labs as listed.  CBC    Component Value Date/Time   WBC 6.1 06/04/2019 1336   RBC 4.53 06/04/2019 1336   HGB 13.9 06/04/2019 1336   HCT 43.2 06/04/2019 1336   PLT 271 06/04/2019 1336   MCV 95.4 06/04/2019 1336   MCH 30.7 06/04/2019 1336   MCHC 32.2 06/04/2019 1336   RDW 13.6 06/04/2019 1336   LYMPHSABS 2.1 06/04/2019 1336    MONOABS 0.6 06/04/2019 1336   EOSABS 0.1 06/04/2019 1336   BASOSABS 0.0 06/04/2019 1336   CMP Latest Ref Rng & Units 06/04/2019 11/21/2018 05/16/2018  Glucose 70 - 99 mg/dL 218(H) 218(H) 132(H)  BUN 8 - 23 mg/dL 22 20 23   Creatinine 0.61 - 1.24 mg/dL 1.53(H) 1.44(H) 1.48(H)  Sodium 135 - 145 mmol/L 132(L) 138 137  Potassium 3.5 - 5.1 mmol/L 4.3 4.4 4.3  Chloride 98 - 111 mmol/L 96(L) 104 104  CO2 22 - 32 mmol/L 26 25 24   Calcium 8.9 - 10.3 mg/dL 9.7 9.7 9.1  Total Protein 6.5 - 8.1 g/dL 7.7 8.0 7.9  Total Bilirubin 0.3 - 1.2 mg/dL 0.9 0.5 0.2(L)  Alkaline Phos 38 - 126 U/L 84 80 75  AST 15 - 41 U/L 20 21 19   ALT 0 - 44 U/L 30 41 26       DIAGNOSTIC IMAGING:  I have independently reviewed the scans and discussed with the patient.    ASSESSMENT & PLAN:   Smoldering myeloma (HCC) 1.  IgG lambda smoldering multiple myeloma: -BM BX on 07/08/2015 shows normocellular marrow with monoclonal plasmacytosis, 10-20%, 46, XY, myeloma FISH panel negative -Denies any new onset bone pains.  Denies any fevers, night sweats or weight loss. -We reviewed labs from 06/04/2019.  M spike is 1.2, slightly up from 0.9.  Free light chain ratio is 0.35.  Lambda light chains increased to 61 from 49.7.  When looking back to the labs from past few years, they have essentially been stable. -We also reviewed his creatinine which was 1.53.  This has gone up slightly from 1.44.  Calcium is 9.7.  Hemoglobin is 13.9. -We reviewed skeletal survey dated 06/04/2019 which was negative for lytic lesions. -We talked about prognosis of smoldering myeloma with with 10% risk of developing into  myeloma..  We will follow him in 6 months with labs.  2.  CKD: -Creatinine is stable between 1.4 and 1.6.    Orders placed this encounter:  Orders Placed This Encounter  Procedures  . CBC with Differential/Platelet  . Comprehensive metabolic panel  . Protein electrophoresis, serum  . Kappa/lambda light chains  . Lactate  dehydrogenase      Derek Jack, MD [place] [phone]

## 2019-06-11 NOTE — Patient Instructions (Addendum)
[facility] at [facility] Discharge Instructions  You were seen today by Dr. Katragadda. He went over your recent lab results. He will see you back in 6 months for labs and follow up.   Thank you for choosing [facility] at [facility] to provide your oncology and hematology care.  To afford each patient quality time with our provider, please arrive at least 15 minutes before your scheduled appointment time.   If you have a lab appointment with the Cancer Center please come in thru the  Main Entrance and check in at the main information desk  You need to re-schedule your appointment should you arrive 10 or more minutes late.  We strive to give you quality time with our providers, and arriving late affects you and other patients whose appointments are after yours.  Also, if you no show three or more times for appointments you may be dismissed from the clinic at the providers discretion.     Again, thank you for choosing [facility].  Our hope is that these requests will decrease the amount of time that you wait before being seen by our physicians.       _____________________________________________________________  Should you have questions after your visit to [facility], please contact our office at [phone] between the hours of 8:00 a.m. and 4:30 p.m.  Voicemails left after 4:00 p.m. will not be returned until the following business day.  For prescription refill requests, have your pharmacy contact our office and allow 72 hours.    Cancer Center Support Programs:   > Cancer Support Group  2nd Tuesday of the month 1pm-2pm, Journey Room    

## 2019-06-11 NOTE — Assessment & Plan Note (Signed)
1.  IgG lambda smoldering multiple myeloma: -BM BX on 07/08/2015 shows normocellular marrow with monoclonal plasmacytosis, 10-20%, 46, XY, myeloma FISH panel negative -Denies any new onset bone pains.  Denies any fevers, night sweats or weight loss. -We reviewed labs from 06/04/2019.  M spike is 1.2, slightly up from 0.9.  Free light chain ratio is 0.35.  Lambda light chains increased to 61 from 49.7.  When looking back to the labs from past few years, they have essentially been stable. -We also reviewed his creatinine which was 1.53.  This has gone up slightly from 1.44.  Calcium is 9.7.  Hemoglobin is 13.9. -We reviewed skeletal survey dated 06/04/2019 which was negative for lytic lesions. -We talked about prognosis of smoldering myeloma with with 10% risk of developing into myeloma..  We will follow him in 6 months with labs.  2.  CKD: -Creatinine is stable between 1.4 and 1.6.

## 2019-06-14 ENCOUNTER — Ambulatory Visit: Payer: Medicare HMO | Attending: Internal Medicine

## 2019-06-14 ENCOUNTER — Other Ambulatory Visit: Payer: Self-pay

## 2019-06-14 DIAGNOSIS — Z23 Encounter for immunization: Secondary | ICD-10-CM | POA: Insufficient documentation

## 2019-06-14 NOTE — Progress Notes (Signed)
   Covid-19 Vaccination Clinic  Name:  Corey Murphy    MRN: [id number] DOB: 12/23/1950  06/14/2019  [name] was observed post Covid-19 immunization for 15 minutes without incidence. He was provided with Vaccine Information Sheet and instruction to access the V-Safe system.   [name] was instructed to call 911 with any severe reactions post vaccine: Marland Kitchen Difficulty breathing  . Swelling of your face and throat  . A fast heartbeat  . A bad rash all over your body  . Dizziness and weakness    Immunizations Administered    Name Date Dose VIS Date Route   Moderna COVID-19 Vaccine 06/14/2019 12:23 PM 0.5 mL 04/01/2019 Intramuscular   Manufacturer: Moderna   Lot: [id number]   [id number]

## 2019-07-12 ENCOUNTER — Ambulatory Visit: Payer: Medicare HMO | Attending: Internal Medicine

## 2019-07-12 DIAGNOSIS — Z23 Encounter for immunization: Secondary | ICD-10-CM

## 2019-07-12 NOTE — Progress Notes (Signed)
   Covid-19 Vaccination Clinic  Name:  Albus Tunnicliff    MRN: [id number] DOB: 1950/06/24  07/12/2019  Mr. Grissett was observed post Covid-19 immunization for 15 minutes without incident. He was provided with Vaccine Information Sheet and instruction to access the V-Safe system.   Mr. Risner was instructed to call 911 with any severe reactions post vaccine: Marland Kitchen Difficulty breathing  . Swelling of face and throat  . A fast heartbeat  . A bad rash all over body  . Dizziness and weakness   Immunizations Administered    Name Date Dose VIS Date Route   Pfizer COVID-19 Vaccine 07/12/2019 12:11 PM 0.3 mL 04/11/2019 Intramuscular   Manufacturer: [place]   Lot: [id number]   [place]: [id number]

## 2019-07-23 DIAGNOSIS — R69 Illness, unspecified: Secondary | ICD-10-CM | POA: Diagnosis not present

## 2019-07-30 DIAGNOSIS — I5032 Chronic diastolic (congestive) heart failure: Secondary | ICD-10-CM | POA: Diagnosis not present

## 2019-07-30 DIAGNOSIS — N183 Chronic kidney disease, stage 3 unspecified: Secondary | ICD-10-CM | POA: Diagnosis not present

## 2019-07-30 DIAGNOSIS — I130 Hypertensive heart and chronic kidney disease with heart failure and stage 1 through stage 4 chronic kidney disease, or unspecified chronic kidney disease: Secondary | ICD-10-CM | POA: Diagnosis not present

## 2019-07-30 DIAGNOSIS — E1122 Type 2 diabetes mellitus with diabetic chronic kidney disease: Secondary | ICD-10-CM | POA: Diagnosis not present

## 2019-09-08 DIAGNOSIS — R69 Illness, unspecified: Secondary | ICD-10-CM | POA: Diagnosis not present

## 2019-09-29 DIAGNOSIS — Z7984 Long term (current) use of oral hypoglycemic drugs: Secondary | ICD-10-CM | POA: Diagnosis not present

## 2019-09-29 DIAGNOSIS — I130 Hypertensive heart and chronic kidney disease with heart failure and stage 1 through stage 4 chronic kidney disease, or unspecified chronic kidney disease: Secondary | ICD-10-CM | POA: Diagnosis not present

## 2019-09-29 DIAGNOSIS — E1122 Type 2 diabetes mellitus with diabetic chronic kidney disease: Secondary | ICD-10-CM | POA: Diagnosis not present

## 2019-09-29 DIAGNOSIS — I5032 Chronic diastolic (congestive) heart failure: Secondary | ICD-10-CM | POA: Diagnosis not present

## 2019-10-27 ENCOUNTER — Ambulatory Visit: Payer: Medicare HMO | Admitting: Cardiology

## 2019-10-27 ENCOUNTER — Other Ambulatory Visit: Payer: Self-pay

## 2019-10-27 ENCOUNTER — Encounter: Payer: Self-pay | Admitting: Cardiology

## 2019-10-27 VITALS — BP 110/70 | HR 81 | Ht 67.0 in | Wt 199.6 lb

## 2019-10-27 DIAGNOSIS — I10 Essential (primary) hypertension: Secondary | ICD-10-CM | POA: Diagnosis not present

## 2019-10-27 DIAGNOSIS — I428 Other cardiomyopathies: Secondary | ICD-10-CM

## 2019-10-27 NOTE — Progress Notes (Signed)
Cardiology Office Note  Date: 10/27/2019   ID: Corey Murphy, DOB 1950/12/20, MRN [id number]  PCP:  Redmond School, MD  Cardiologist:  Rozann Lesches, MD Electrophysiologist:  None   Chief Complaint  Patient presents with  . Cardiac follow-up    History of Present Illness: Corey Murphy is a 69 y.o. male last assessed via telehealth counter in May 2020.  He presents today with his wife for a routine follow-up visit.  Overall doing well, no exertional chest pain or shortness of breath beyond NYHA class II.  He has been walking regularly for exercise, walk 5 miles this morning.  He does not report any orthopnea or PND, no leg swelling, no palpitations or syncope.  Echocardiogram from May of last year showed LVEF 55 to 60%.  Cardiac regimen is stable and outlined below, he continues to tolerate this well.  I personally reviewed his ECG today which shows sinus rhythm with prolonged PR interval.  Past Medical History:  Diagnosis Date  . Alcohol dependency ([place])   . Chronic kidney disease   . Colonic adenoma 2006  . Essential hypertension   . History of cardiac catheterization    03/2013 LM nl, LAD nl, D1/2/3 small - nl, LCX nondom - nl, OM1/2/3 nl, RCA dom  . Hypercholesteremia   . 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 Nonischemic cardiomyopathy (HCC)    LVEF approximately 40% 06/2013 - improved to normal range  . Obesity   . Type 2 diabetes mellitus ([place])     Past Surgical History:  Procedure Laterality Date  . CIRCUMCISION    . COLONOSCOPY  05/20/2004   RMR: Internal hemorrhoids.  Diminutive adenomatous polyp in the mid-right colon, otherwise normal  . COLONOSCOPY N/A 06/19/2012   Procedure: COLONOSCOPY;  Surgeon: Daneil Dolin, MD;  [facility];  Service: Endoscopy;  Laterality: N/A;  8:30AM-rescheduled 10:30am Darius Bump notified pt  . COLONOSCOPY N/A 08/08/2017   Surgeon: Daneil Dolin, MD;  diverticulosis in sigmoid, descending, and transverse  colon, three 4-7 mm polyps in ascending colon, and one 9 mm polyp in ascending colon. Pathology with tubular adenomas. Repeat in 3 years.  . COLONOSCOPY N/A 04/23/2019   Sigmoid and descending colon diverticulosis, one 5 mm polyp (tubular adenoma) in descending colon, non-bleeding internal hemorrhoids. 5 year surveillance.   . Ileocolonoscopy  06/28/2007   [id number] rectum/Submucosal sigmoid diverticula (chronic), doubt clinical significance Hyperplastic polypoid-appearing fold, mid ascending colon, status/Remainder colonic mucosa and terminal ileum mucosa appeared normal   . LEFT AND RIGHT HEART CATHETERIZATION WITH CORONARY ANGIOGRAM  03/19/2013   Procedure: LEFT AND RIGHT HEART CATHETERIZATION WITH CORONARY ANGIOGRAM;  Surgeon: Wellington Hampshire, MD;  Location: [facility] CATH LAB;  Service: Cardiovascular;;  . POLYPECTOMY  08/08/2017   Procedure: POLYPECTOMY;  Surgeon: Daneil Dolin, MD;  [facility];  Service: Endoscopy;;  ascending colon polyps x4 (cold snare-3,hot snare)  . TOTAL HIP ARTHROPLASTY  2010   Left  . TOTAL HIP ARTHROPLASTY  2010   Right    Current Outpatient Medications  Medication Sig Dispense Refill  . aspirin 81 MG tablet Take 81 mg by mouth daily.    . carvedilol (COREG) 12.5 MG tablet TAKE 1 TABLET BY MOUTH 2 TIMES DAILY WITH A MEAL. 180 tablet 3  . furosemide (LASIX) 20 MG tablet Take 1 tablet (20 mg total) by mouth daily. 90 tablet 3  . glimepiride (AMARYL) 2 MG tablet Take 2 mg by mouth 2 (two) times a day.     Marland Kitchen  HYDROcodone-acetaminophen (NORCO) 10-325 MG tablet Take 1 tablet by mouth every 6 (six) hours as needed for moderate pain.     Marland Kitchen lisinopril (PRINIVIL,ZESTRIL) 2.5 MG tablet Take 2.5 mg by mouth in the morning and at bedtime.     . Multiple Vitamins-Iron (MULTIVITAMIN/IRON PO) Take 1 tablet by mouth daily.     . simvastatin (ZOCOR) 20 MG tablet Take 20 mg by mouth daily.   3  . spironolactone (ALDACTONE) 25 MG tablet Take 12.5 mg by mouth daily.      No current facility-administered medications for this visit.   Allergies:  Other and Peanut-containing drug products   ROS:   No palpitations or syncope.  Physical Exam: VS:  BP 110/70   Pulse 81   Ht 5\' 7"  (1.702 m)   Wt 199 lb 9.6 oz (90.5 kg)   SpO2 97%   BMI 31.26 kg/m , BMI Body mass index is 31.26 kg/m.  Wt Readings from Last 3 Encounters:  10/27/19 199 lb 9.6 oz (90.5 kg)  06/11/19 204 lb 11.2 oz (92.9 kg)  06/03/19 214 lb 12.8 oz (97.4 kg)    General: Patient appears comfortable at rest. HEENT: Conjunctiva and lids normal, wearing a mask. Neck: Supple, no elevated JVP or carotid bruits, no thyromegaly. Lungs: Clear to auscultation, nonlabored breathing at rest. Cardiac: Regular rate and rhythm, no S3 or significant systolic murmur, no pericardial rub. Extremities: No pitting edema, distal pulses 2+.  ECG:  An ECG dated 02/26/2018 was personally reviewed today and demonstrated:  Sinus rhythm with decreased R wave progression.  Recent Labwork: 06/04/2019: ALT 30; AST 20; BUN 22; Creatinine, Ser 1.53; Hemoglobin 13.9; Platelets 271; Potassium 4.3; Sodium 132   Other Studies Reviewed Today:  Echocardiogram 09/11/2018: 1. The left ventricle has normal systolic function, with an ejection fraction of 55-60%. The cavity size was normal. There is moderate concentric left ventricular hypertrophy. Left ventricular diastolic Doppler parameters are consistent with impaired  relaxation. Indeterminate filling pressures No evidence of left ventricular regional wall motion abnormalities. 2. The right ventricle has normal systolic function. The cavity was normal. There is no increase in right ventricular wall thickness. 3. The mitral valve is grossly normal. 4. The tricuspid valve is grossly normal. 5. The aortic valve is tricuspid. 6. The aortic root is normal in size and structure.  Assessment and Plan:  1.  Nonischemic cardiomyopathy by history, improved on medical  therapy.  LVEF was 55 to 60% by echocardiogram last year.  He has good exercise tolerance and remains clinically stable at this point.  ECG reviewed.  Continue observation on Coreg, lisinopril, Aldactone, and Lasix.  Keep interval follow-up with PCP.  2.  Essential hypertension, blood pressure is normal today.  No changes made to current treatment.  Continue regular exercise.  Medication Adjustments/Labs and Tests Ordered: Current medicines are reviewed at length with the patient today.  Concerns regarding medicines are outlined above.   Tests Ordered: Orders Placed This Encounter  Procedures  . EKG 12-Lead    Medication Changes: No orders of the defined types were placed in this encounter.   Disposition:  Follow up 1 year in the [place] office.  Signed, Satira Sark, MD, [facility] 10/27/2019 4:26 PM    [facility] at [facility] [address], [place], [place] [address] Phone: [phone]; Fax: [phone]

## 2019-10-27 NOTE — Patient Instructions (Signed)
Medication Instructions:  Your physician recommends that you continue on your current medications as directed. Please refer to the Current Medication list given to you today.  *If you need a refill on your cardiac medications before your next appointment, please call your pharmacy*   Lab Work: None today If you have labs (blood work) drawn today and your tests are completely normal, you will receive your results only by: . MyChart Message (if you have MyChart) OR . A paper copy in the mail If you have any lab test that is abnormal or we need to change your treatment, we will call you to review the results.   Testing/Procedures: None today   Follow-Up: At [facility], you and your health needs are our priority.  As part of our continuing mission to provide you with exceptional heart care, we have created designated Provider Care Teams.  These Care Teams include your primary Cardiologist (physician) and Advanced Practice Providers (APPs -  Physician Assistants and Nurse Practitioners) who all work together to provide you with the care you need, when you need it.  We recommend signing up for the patient portal called "MyChart".  Sign up information is provided on this After Visit Summary.  MyChart is used to connect with patients for Virtual Visits (Telemedicine).  Patients are able to view lab/test results, encounter notes, upcoming appointments, etc.  Non-urgent messages can be sent to your provider as well.   To learn more about what you can do with MyChart, go to [email or web address].    Your next appointment:   12 month(s)  The format for your next appointment:   In Person  Provider:   Samuel McDowell, MD   Other Instructions None       Thank you for choosing [facility] !         

## 2019-10-30 DIAGNOSIS — R69 Illness, unspecified: Secondary | ICD-10-CM | POA: Diagnosis not present

## 2019-11-24 DIAGNOSIS — R69 Illness, unspecified: Secondary | ICD-10-CM | POA: Diagnosis not present

## 2019-12-03 DIAGNOSIS — Z1389 Encounter for screening for other disorder: Secondary | ICD-10-CM | POA: Diagnosis not present

## 2019-12-03 DIAGNOSIS — E6609 Other obesity due to excess calories: Secondary | ICD-10-CM | POA: Diagnosis not present

## 2019-12-03 DIAGNOSIS — N529 Male erectile dysfunction, unspecified: Secondary | ICD-10-CM | POA: Diagnosis not present

## 2019-12-03 DIAGNOSIS — C9001 Multiple myeloma in remission: Secondary | ICD-10-CM | POA: Diagnosis not present

## 2019-12-03 DIAGNOSIS — R201 Hypoesthesia of skin: Secondary | ICD-10-CM | POA: Diagnosis not present

## 2019-12-03 DIAGNOSIS — E119 Type 2 diabetes mellitus without complications: Secondary | ICD-10-CM | POA: Diagnosis not present

## 2019-12-03 DIAGNOSIS — M2140 Flat foot [pes planus] (acquired), unspecified foot: Secondary | ICD-10-CM | POA: Diagnosis not present

## 2019-12-03 DIAGNOSIS — Z6832 Body mass index (BMI) 32.0-32.9, adult: Secondary | ICD-10-CM | POA: Diagnosis not present

## 2019-12-03 DIAGNOSIS — I10 Essential (primary) hypertension: Secondary | ICD-10-CM | POA: Diagnosis not present

## 2019-12-05 ENCOUNTER — Other Ambulatory Visit: Payer: Self-pay

## 2019-12-05 ENCOUNTER — Inpatient Hospital Stay (HOSPITAL_COMMUNITY): Payer: Medicare HMO | Attending: Hematology

## 2019-12-05 DIAGNOSIS — N189 Chronic kidney disease, unspecified: Secondary | ICD-10-CM | POA: Insufficient documentation

## 2019-12-05 DIAGNOSIS — Z7982 Long term (current) use of aspirin: Secondary | ICD-10-CM | POA: Insufficient documentation

## 2019-12-05 DIAGNOSIS — Z87891 Personal history of nicotine dependence: Secondary | ICD-10-CM | POA: Diagnosis not present

## 2019-12-05 DIAGNOSIS — E669 Obesity, unspecified: Secondary | ICD-10-CM | POA: Diagnosis not present

## 2019-12-05 DIAGNOSIS — E7800 Pure hypercholesterolemia, unspecified: Secondary | ICD-10-CM | POA: Diagnosis not present

## 2019-12-05 DIAGNOSIS — C9000 Multiple myeloma not having achieved remission: Secondary | ICD-10-CM | POA: Diagnosis present

## 2019-12-05 DIAGNOSIS — Z79899 Other long term (current) drug therapy: Secondary | ICD-10-CM | POA: Diagnosis not present

## 2019-12-05 DIAGNOSIS — I129 Hypertensive chronic kidney disease with stage 1 through stage 4 chronic kidney disease, or unspecified chronic kidney disease: Secondary | ICD-10-CM | POA: Diagnosis not present

## 2019-12-05 DIAGNOSIS — E1122 Type 2 diabetes mellitus with diabetic chronic kidney disease: Secondary | ICD-10-CM | POA: Diagnosis not present

## 2019-12-05 DIAGNOSIS — Z7984 Long term (current) use of oral hypoglycemic drugs: Secondary | ICD-10-CM | POA: Insufficient documentation

## 2019-12-05 DIAGNOSIS — D472 Monoclonal gammopathy: Secondary | ICD-10-CM

## 2019-12-05 LAB — CBC WITH DIFFERENTIAL/PLATELET
Abs Immature Granulocytes: 0.01 10*3/uL (ref 0.00–0.07)
Basophils Absolute: 0 10*3/uL (ref 0.0–0.1)
Basophils Relative: 1 %
Eosinophils Absolute: 0.1 10*3/uL (ref 0.0–0.5)
Eosinophils Relative: 1 %
HCT: 44.3 % (ref 39.0–52.0)
Hemoglobin: 14.2 g/dL (ref 13.0–17.0)
Immature Granulocytes: 0 %
Lymphocytes Relative: 40 %
Lymphs Abs: 2 10*3/uL (ref 0.7–4.0)
MCH: 31.1 pg (ref 26.0–34.0)
MCHC: 32.1 g/dL (ref 30.0–36.0)
MCV: 96.9 fL (ref 80.0–100.0)
Monocytes Absolute: 0.5 10*3/uL (ref 0.1–1.0)
Monocytes Relative: 11 %
Neutro Abs: 2.4 10*3/uL (ref 1.7–7.7)
Neutrophils Relative %: 47 %
Platelets: 277 10*3/uL (ref 150–400)
RBC: 4.57 MIL/uL (ref 4.22–5.81)
RDW: 13.5 % (ref 11.5–15.5)
WBC: 5.1 10*3/uL (ref 4.0–10.5)
nRBC: 0 % (ref 0.0–0.2)

## 2019-12-05 LAB — COMPREHENSIVE METABOLIC PANEL
ALT: 34 U/L (ref 0–44)
AST: 17 U/L (ref 15–41)
Albumin: 4.2 g/dL (ref 3.5–5.0)
Alkaline Phosphatase: 86 U/L (ref 38–126)
Anion gap: 10 (ref 5–15)
BUN: 29 mg/dL — ABNORMAL HIGH (ref 8–23)
CO2: 25 mmol/L (ref 22–32)
Calcium: 9.4 mg/dL (ref 8.9–10.3)
Chloride: 102 mmol/L (ref 98–111)
Creatinine, Ser: 1.57 mg/dL — ABNORMAL HIGH (ref 0.61–1.24)
GFR calc Af Amer: 52 mL/min — ABNORMAL LOW (ref >60)
GFR calc non Af Amer: 45 mL/min — ABNORMAL LOW (ref >60)
Glucose, Bld: 203 mg/dL — ABNORMAL HIGH (ref 70–99)
Potassium: 4.4 mmol/L (ref 3.5–5.1)
Sodium: 137 mmol/L (ref 135–145)
Total Bilirubin: 0.5 mg/dL (ref 0.3–1.2)
Total Protein: 8.5 g/dL — ABNORMAL HIGH (ref 6.5–8.1)

## 2019-12-05 LAB — LACTATE DEHYDROGENASE: LDH: 88 U/L — ABNORMAL LOW (ref 98–192)

## 2019-12-08 LAB — KAPPA/LAMBDA LIGHT CHAINS
Kappa free light chain: 31.5 mg/L — ABNORMAL HIGH (ref 3.3–19.4)
Kappa, lambda light chain ratio: 0.5 (ref 0.26–1.65)
Lambda free light chains: 63.4 mg/L — ABNORMAL HIGH (ref 5.7–26.3)

## 2019-12-08 LAB — PROTEIN ELECTROPHORESIS, SERUM
A/G Ratio: 0.9 (ref 0.7–1.7)
Albumin ELP: 3.9 g/dL (ref 2.9–4.4)
Alpha-1-Globulin: 0.2 g/dL (ref 0.0–0.4)
Alpha-2-Globulin: 0.8 g/dL (ref 0.4–1.0)
Beta Globulin: 1.4 g/dL — ABNORMAL HIGH (ref 0.7–1.3)
Gamma Globulin: 1.9 g/dL — ABNORMAL HIGH (ref 0.4–1.8)
Globulin, Total: 4.3 g/dL — ABNORMAL HIGH (ref 2.2–3.9)
M-Spike, %: 1.5 g/dL — ABNORMAL HIGH
Total Protein ELP: 8.2 g/dL (ref 6.0–8.5)

## 2019-12-10 ENCOUNTER — Other Ambulatory Visit: Payer: Self-pay

## 2019-12-10 ENCOUNTER — Inpatient Hospital Stay (HOSPITAL_COMMUNITY): Payer: Medicare HMO | Admitting: Hematology

## 2019-12-10 VITALS — BP 123/62 | HR 66 | Temp 97.1°F | Resp 18 | Wt 206.5 lb

## 2019-12-10 DIAGNOSIS — D472 Monoclonal gammopathy: Secondary | ICD-10-CM

## 2019-12-10 DIAGNOSIS — C9000 Multiple myeloma not having achieved remission: Secondary | ICD-10-CM | POA: Diagnosis not present

## 2019-12-10 NOTE — Progress Notes (Signed)
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 male, returns for routine follow-up for his smoldering IgG lambda multiple myeloma. Corey Murphy was last seen on 06/11/2019.  Today Corey Murphy is accompanied by his wife. Corey Murphy denies having any new pains and reports feeling good. His energy levels and appetite are good.   REVIEW OF SYSTEMS:  Review of Systems  Constitutional: Negative for appetite change and fatigue.  Musculoskeletal: Negative for arthralgias and myalgias.  Neurological: Positive for numbness (hands).  Psychiatric/Behavioral: Positive for sleep disturbance.  All other systems reviewed and are negative.   PAST MEDICAL/SURGICAL HISTORY:  Past Medical History:  Diagnosis Date  . Alcohol dependency ([place])   . Chronic kidney disease   . Colonic adenoma 2006  . Essential hypertension   . History of cardiac catheterization    03/2013 LM nl, LAD nl, D1/2/3 small - nl, LCX nondom - nl, OM1/2/3 nl, RCA dom  . Hypercholesteremia   . 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 Nonischemic cardiomyopathy (HCC)    LVEF approximately 40% 06/2013 - improved to normal range  . Obesity   . Type 2 diabetes mellitus ([place])    Past Surgical History:  Procedure Laterality Date  . CIRCUMCISION    . COLONOSCOPY  05/20/2004   RMR: Internal hemorrhoids.  Diminutive adenomatous polyp in the mid-right colon, otherwise normal  . COLONOSCOPY N/A 06/19/2012   Procedure: COLONOSCOPY;  Surgeon: Daneil Dolin, MD;  [facility];  Service: Endoscopy;  Laterality: N/A;  8:30AM-rescheduled 10:30am Darius Bump notified pt  . COLONOSCOPY N/A 08/08/2017   Surgeon: Daneil Dolin,  MD;  diverticulosis in sigmoid, descending, and transverse colon, three 4-7 mm polyps in ascending colon, and one 9 mm polyp in ascending colon. Pathology with tubular adenomas. Repeat in 3 years.  . COLONOSCOPY N/A 04/23/2019   Sigmoid and descending colon diverticulosis, one 5 mm polyp (tubular adenoma) in descending colon, non-bleeding internal hemorrhoids. 5 year surveillance.   . Ileocolonoscopy  06/28/2007   VZS:MOLMBE rectum/Submucosal sigmoid diverticula (chronic), doubt clinical significance Hyperplastic polypoid-appearing fold, mid ascending colon, status/Remainder colonic mucosa and terminal ileum mucosa appeared normal   . LEFT AND RIGHT HEART CATHETERIZATION WITH CORONARY ANGIOGRAM  03/19/2013   Procedure: LEFT AND RIGHT HEART CATHETERIZATION WITH CORONARY ANGIOGRAM;  Surgeon: Wellington Hampshire, MD;  Location: [facility];  Service: Cardiovascular;;  . POLYPECTOMY  08/08/2017   Procedure: POLYPECTOMY;  Surgeon: Daneil Dolin, MD;  [facility];  Service: Endoscopy;;  ascending colon polyps x4 (cold snare-3,hot snare)  . TOTAL HIP ARTHROPLASTY  2010   Left  . TOTAL HIP ARTHROPLASTY  2010   Right    SOCIAL HISTORY:  Social History   Socioeconomic History  . Marital status: Married    Spouse name: Not on file  . Number of children: 2  . Years of education: Not on file  . Highest education level: Not on file  Occupational History  . Occupation: Retired, Arts development officer  . Smoking status: Former Smoker    Packs/day: 1.00    Years: 12.00    Pack years: 12.00    Types: Cigarettes    Quit date: 05/28/2008  Years since quitting: 11.5  . Smokeless tobacco: Never Used  Vaping Use  . Vaping Use: Never used  Substance and Sexual Activity  . Alcohol use: Yes    Comment: Occasionally 2 to 3 times a month  . Drug use: Yes    Types: Marijuana    Comment: Smoked via a bowl occassionally.   . Sexual activity: Not on file  Other Topics Concern  . Not on  file  Social History Narrative  . Not on file   Social Determinants of Health   Financial Resource Strain:   . Difficulty of Paying Living Expenses:   Food Insecurity:   . Worried About Charity fundraiser in the Last Year:   . Arboriculturist in the Last Year:   Transportation Needs:   . Film/video editor (Medical):   Marland Kitchen Lack of Transportation (Non-Medical):   Physical Activity:   . Days of Exercise per Week:   . Minutes of Exercise per Session:   Stress:   . Feeling of Stress :   Social Connections:   . Frequency of Communication with Friends and Family:   . Frequency of Social Gatherings with Friends and Family:   . Attends Religious Services:   . Active Member of Clubs or Organizations:   . Attends Archivist Meetings:   Marland Kitchen Marital Status:   Intimate Partner Violence:   . Fear of Current or Ex-Partner:   . Emotionally Abused:   Marland Kitchen Physically Abused:   . Sexually Abused:     FAMILY HISTORY:  Family History  Problem Relation Age of Onset  . Heart failure Mother   . Diabetes Mother   . Hypertension Mother   . Heart failure Sister   . Hypertension Sister   . Cancer Sister   . Colon cancer Neg Hx     CURRENT MEDICATIONS:  Current Outpatient Medications  Medication Sig Dispense Refill  . aspirin 81 MG tablet Take 81 mg by mouth daily.    . carvedilol (COREG) 12.5 MG tablet TAKE 1 TABLET BY MOUTH 2 TIMES DAILY WITH A MEAL. 180 tablet 3  . furosemide (LASIX) 20 MG tablet Take 1 tablet (20 mg total) by mouth daily. 90 tablet 3  . glimepiride (AMARYL) 2 MG tablet Take 2 mg by mouth 2 (two) times a day.     Marland Kitchen HYDROcodone-acetaminophen (NORCO) 10-325 MG tablet Take 1 tablet by mouth every 6 (six) hours as needed for moderate pain.     Marland Kitchen lisinopril (PRINIVIL,ZESTRIL) 2.5 MG tablet Take 2.5 mg by mouth in the morning and at bedtime.     . Multiple Vitamins-Iron (MULTIVITAMIN/IRON PO) Take 1 tablet by mouth daily.     . simvastatin (ZOCOR) 20 MG tablet Take 20  mg by mouth daily.   3  . spironolactone (ALDACTONE) 25 MG tablet Take 12.5 mg by mouth daily.     No current facility-administered medications for this visit.    ALLERGIES:  Allergies  Allergen Reactions  . Other   . Peanut-Containing Drug Products Rash    *Cashews    PHYSICAL EXAM:  Performance status (ECOG): 1 - Symptomatic but completely ambulatory  Vitals:   12/10/19 1437  BP: 123/62  Pulse: 66  Resp: 18  Temp: (!) 97.1 F (36.2 C)  SpO2: 99%   Wt Readings from Last 3 Encounters:  12/10/19 206 lb 8 oz (93.7 kg)  10/27/19 199 lb 9.6 oz (90.5 kg)  06/11/19 204 lb 11.2 oz (92.9 kg)  Physical Exam Vitals reviewed.  Constitutional:      Appearance: Normal appearance. Corey Murphy is obese.  Cardiovascular:     Rate and Rhythm: Normal rate and regular rhythm.     Pulses: Normal pulses.     Heart sounds: Normal heart sounds.  Pulmonary:     Effort: Pulmonary effort is normal.     Breath sounds: Normal breath sounds.  Abdominal:     Palpations: Abdomen is soft. There is no hepatomegaly, splenomegaly or mass.     Tenderness: There is no abdominal tenderness.     Hernia: No hernia is present.  Lymphadenopathy:     Cervical: No cervical adenopathy.     Upper Body:     Right upper body: No supraclavicular or axillary adenopathy.     Left upper body: No supraclavicular or axillary adenopathy.  Neurological:     General: No focal deficit present.     Mental Status: Corey Murphy is alert and oriented to person, place, and time.  Psychiatric:        Mood and Affect: Mood normal.        Behavior: Behavior normal.     LABORATORY DATA:  I have reviewed the labs as listed.  CBC Latest Ref Rng & Units 12/05/2019 06/04/2019 11/21/2018  WBC 4.0 - 10.5 K/uL 5.1 6.1 5.0  Hemoglobin 13.0 - 17.0 g/dL 14.2 13.9 13.7  Hematocrit 39 - 52 % 44.3 43.2 42.1  Platelets 150 - 400 K/uL 277 271 262   CMP Latest Ref Rng & Units 12/05/2019 06/04/2019 11/21/2018  Glucose 70 - 99 mg/dL 203(H) 218(H) 218(H)  BUN  8 - 23 mg/dL 29(H) 22 20  Creatinine 0.61 - 1.24 mg/dL 1.57(H) 1.53(H) 1.44(H)  Sodium 135 - 145 mmol/L 137 132(L) 138  Potassium 3.5 - 5.1 mmol/L 4.4 4.3 4.4  Chloride 98 - 111 mmol/L 102 96(L) 104  CO2 22 - 32 mmol/L 25 26 25   Calcium 8.9 - 10.3 mg/dL 9.4 9.7 9.7  Total Protein 6.5 - 8.1 g/dL 8.5(H) 7.7 8.0  Total Bilirubin 0.3 - 1.2 mg/dL 0.5 0.9 0.5  Alkaline Phos 38 - 126 U/L 86 84 80  AST 15 - 41 U/L 17 20 21   ALT 0 - 44 U/L 34 30 41      Component Value Date/Time   RBC 4.57 12/05/2019 1349   MCV 96.9 12/05/2019 1349   MCH 31.1 12/05/2019 1349   MCHC 32.1 12/05/2019 1349   RDW 13.5 12/05/2019 1349   LYMPHSABS 2.0 12/05/2019 1349   MONOABS 0.5 12/05/2019 1349   EOSABS 0.1 12/05/2019 1349   BASOSABS 0.0 12/05/2019 1349   Lab Results  Component Value Date   LDH 88 (L) 12/05/2019   LDH 94 (L) 06/04/2019   LDH 96 (L) 11/21/2018   Lab Results  Component Value Date   TOTALPROTELP 8.2 12/05/2019   ALBUMINELP 3.9 12/05/2019   A1GS 0.2 12/05/2019   A2GS 0.8 12/05/2019   BETS 1.4 (H) 12/05/2019   GAMS 1.9 (H) 12/05/2019   MSPIKE 1.5 (H) 12/05/2019   SPEI Comment 12/05/2019    Lab Results  Component Value Date   KPAFRELGTCHN 31.5 (H) 12/05/2019   LAMBDASER 63.4 (H) 12/05/2019   KAPLAMBRATIO 0.50 12/05/2019    DIAGNOSTIC IMAGING:  I have independently reviewed the scans and discussed with the patient. No results found.   ASSESSMENT:  1.  IgG lambda smoldering multiple myeloma: -BM BX on 07/08/2015 shows normocellular marrow with monoclonal plasmacytosis, 10-20%, 46, XY, myeloma FISH panel negative -Skeletal  survey on 06/04/2019 was negative for lytic lesions.  2.  CKD: -Baseline creatinine between 1.4-1.6.   PLAN:  1.  IgG lambda smoldering multiple myeloma: -Reviewed labs from 12/05/2019.  M spike has slightly gone up to 1.5, previously 1.2 g.  Hemoglobin was normal.  Creatinine is stable.  Calcium normal.  LDH was 88. -Free kappa light chains are 31.5, lambda  light chain 63.4.  Ratio is 0.5. -As his M spike and lambda light chains have slightly increased, I have recommended follow-up in 4 months with repeat labs.  I plan to repeat skeletal survey at that time.  2.  CKD: -This has been stable around 1.5.  Recommended adequate hydration.   Orders placed this encounter:  No orders of the defined types were placed in this encounter.    Derek Jack, MD [place] [phone]   I, Milinda Antis, am acting as a scribe for Dr. Sanda Linger.  I, Derek Jack MD, have reviewed the above documentation for accuracy and completeness, and I agree with the above.

## 2019-12-10 NOTE — Patient Instructions (Signed)
[facility] °Discharge Instructions ° °You were seen today by Dr. Katragadda. He went over your recent results. Dr. Katragadda will see you back in 4 months for labs and follow up. ° ° °Thank you for choosing [facility] to provide your oncology and hematology care.  To afford each patient quality time with our provider, please arrive at least 15 minutes before your scheduled appointment time.  ° °If you have a lab appointment with the Cancer Center please come in thru the Main Entrance and check in at the main information desk ° °You need to re-schedule your appointment should you arrive 10 or more minutes late.  We strive to give you quality time with our providers, and arriving late affects you and other patients whose appointments are after yours.  Also, if you no show three or more times for appointments you may be dismissed from the clinic at the providers discretion.     °Again, thank you for choosing [facility].  Our hope is that these requests will decrease the amount of time that you wait before being seen by our physicians.       °_____________________________________________________________ ° °Should you have questions after your visit to [facility], please contact our office at [phone] between the hours of 8:00 a.m. and 4:30 p.m.  Voicemails left after 4:00 p.m. will not be returned until the following business day.  For prescription refill requests, have your pharmacy contact our office and allow 72 hours.   ° °Cancer Center Support Programs:  ° °> Cancer Support Group  °2nd Tuesday of the month 1pm-2pm, Journey Room  ° ° °

## 2019-12-20 DIAGNOSIS — R69 Illness, unspecified: Secondary | ICD-10-CM | POA: Diagnosis not present

## 2020-01-03 ENCOUNTER — Other Ambulatory Visit: Payer: Self-pay | Admitting: Cardiology

## 2020-01-14 DIAGNOSIS — E1122 Type 2 diabetes mellitus with diabetic chronic kidney disease: Secondary | ICD-10-CM | POA: Diagnosis not present

## 2020-01-14 DIAGNOSIS — I129 Hypertensive chronic kidney disease with stage 1 through stage 4 chronic kidney disease, or unspecified chronic kidney disease: Secondary | ICD-10-CM | POA: Diagnosis not present

## 2020-01-14 DIAGNOSIS — R778 Other specified abnormalities of plasma proteins: Secondary | ICD-10-CM | POA: Diagnosis not present

## 2020-01-14 DIAGNOSIS — N183 Chronic kidney disease, stage 3 unspecified: Secondary | ICD-10-CM | POA: Diagnosis not present

## 2020-01-21 DIAGNOSIS — Z23 Encounter for immunization: Secondary | ICD-10-CM | POA: Diagnosis not present

## 2020-01-29 DIAGNOSIS — E1122 Type 2 diabetes mellitus with diabetic chronic kidney disease: Secondary | ICD-10-CM | POA: Diagnosis not present

## 2020-01-29 DIAGNOSIS — I2510 Atherosclerotic heart disease of native coronary artery without angina pectoris: Secondary | ICD-10-CM | POA: Diagnosis not present

## 2020-01-29 DIAGNOSIS — Z7984 Long term (current) use of oral hypoglycemic drugs: Secondary | ICD-10-CM | POA: Diagnosis not present

## 2020-01-29 DIAGNOSIS — Z79899 Other long term (current) drug therapy: Secondary | ICD-10-CM | POA: Diagnosis not present

## 2020-02-05 DIAGNOSIS — R69 Illness, unspecified: Secondary | ICD-10-CM | POA: Diagnosis not present

## 2020-02-06 DIAGNOSIS — E1129 Type 2 diabetes mellitus with other diabetic kidney complication: Secondary | ICD-10-CM | POA: Diagnosis not present

## 2020-02-06 DIAGNOSIS — I2510 Atherosclerotic heart disease of native coronary artery without angina pectoris: Secondary | ICD-10-CM | POA: Diagnosis not present

## 2020-02-06 DIAGNOSIS — Z1389 Encounter for screening for other disorder: Secondary | ICD-10-CM | POA: Diagnosis not present

## 2020-02-06 DIAGNOSIS — I10 Essential (primary) hypertension: Secondary | ICD-10-CM | POA: Diagnosis not present

## 2020-02-06 DIAGNOSIS — E7849 Other hyperlipidemia: Secondary | ICD-10-CM | POA: Diagnosis not present

## 2020-02-06 DIAGNOSIS — Z125 Encounter for screening for malignant neoplasm of prostate: Secondary | ICD-10-CM | POA: Diagnosis not present

## 2020-02-06 DIAGNOSIS — Z0001 Encounter for general adult medical examination with abnormal findings: Secondary | ICD-10-CM | POA: Diagnosis not present

## 2020-02-06 DIAGNOSIS — Z6832 Body mass index (BMI) 32.0-32.9, adult: Secondary | ICD-10-CM | POA: Diagnosis not present

## 2020-02-06 DIAGNOSIS — G894 Chronic pain syndrome: Secondary | ICD-10-CM | POA: Diagnosis not present

## 2020-02-06 DIAGNOSIS — C9001 Multiple myeloma in remission: Secondary | ICD-10-CM | POA: Diagnosis not present

## 2020-02-06 DIAGNOSIS — E6609 Other obesity due to excess calories: Secondary | ICD-10-CM | POA: Diagnosis not present

## 2020-02-06 DIAGNOSIS — I509 Heart failure, unspecified: Secondary | ICD-10-CM | POA: Diagnosis not present

## 2020-02-06 DIAGNOSIS — E039 Hypothyroidism, unspecified: Secondary | ICD-10-CM | POA: Diagnosis not present

## 2020-02-06 DIAGNOSIS — N529 Male erectile dysfunction, unspecified: Secondary | ICD-10-CM | POA: Diagnosis not present

## 2020-02-17 DIAGNOSIS — E0590 Thyrotoxicosis, unspecified without thyrotoxic crisis or storm: Secondary | ICD-10-CM | POA: Diagnosis not present

## 2020-02-26 ENCOUNTER — Other Ambulatory Visit: Payer: Self-pay | Admitting: Cardiology

## 2020-02-28 DIAGNOSIS — E1122 Type 2 diabetes mellitus with diabetic chronic kidney disease: Secondary | ICD-10-CM | POA: Diagnosis not present

## 2020-02-28 DIAGNOSIS — I2510 Atherosclerotic heart disease of native coronary artery without angina pectoris: Secondary | ICD-10-CM | POA: Diagnosis not present

## 2020-02-28 DIAGNOSIS — Z7984 Long term (current) use of oral hypoglycemic drugs: Secondary | ICD-10-CM | POA: Diagnosis not present

## 2020-02-28 DIAGNOSIS — Z79899 Other long term (current) drug therapy: Secondary | ICD-10-CM | POA: Diagnosis not present

## 2020-03-24 DIAGNOSIS — R69 Illness, unspecified: Secondary | ICD-10-CM | POA: Diagnosis not present

## 2020-04-07 ENCOUNTER — Other Ambulatory Visit: Payer: Self-pay

## 2020-04-07 ENCOUNTER — Inpatient Hospital Stay (HOSPITAL_COMMUNITY): Payer: Medicare HMO | Attending: Hematology and Oncology

## 2020-04-07 DIAGNOSIS — I10 Essential (primary) hypertension: Secondary | ICD-10-CM | POA: Insufficient documentation

## 2020-04-07 DIAGNOSIS — I428 Other cardiomyopathies: Secondary | ICD-10-CM | POA: Diagnosis not present

## 2020-04-07 DIAGNOSIS — N289 Disorder of kidney and ureter, unspecified: Secondary | ICD-10-CM | POA: Diagnosis not present

## 2020-04-07 DIAGNOSIS — C9000 Multiple myeloma not having achieved remission: Secondary | ICD-10-CM

## 2020-04-07 DIAGNOSIS — D472 Monoclonal gammopathy: Secondary | ICD-10-CM

## 2020-04-07 LAB — CBC WITH DIFFERENTIAL/PLATELET
Abs Immature Granulocytes: 0.01 10*3/uL (ref 0.00–0.07)
Basophils Absolute: 0 10*3/uL (ref 0.0–0.1)
Basophils Relative: 1 %
Eosinophils Absolute: 0.1 10*3/uL (ref 0.0–0.5)
Eosinophils Relative: 2 %
HCT: 41.9 % (ref 39.0–52.0)
Hemoglobin: 13.6 g/dL (ref 13.0–17.0)
Immature Granulocytes: 0 %
Lymphocytes Relative: 37 %
Lymphs Abs: 1.8 10*3/uL (ref 0.7–4.0)
MCH: 31.1 pg (ref 26.0–34.0)
MCHC: 32.5 g/dL (ref 30.0–36.0)
MCV: 95.7 fL (ref 80.0–100.0)
Monocytes Absolute: 0.6 10*3/uL (ref 0.1–1.0)
Monocytes Relative: 11 %
Neutro Abs: 2.4 10*3/uL (ref 1.7–7.7)
Neutrophils Relative %: 49 %
Platelets: 248 10*3/uL (ref 150–400)
RBC: 4.38 MIL/uL (ref 4.22–5.81)
RDW: 13.6 % (ref 11.5–15.5)
WBC: 4.9 10*3/uL (ref 4.0–10.5)
nRBC: 0 % (ref 0.0–0.2)

## 2020-04-07 LAB — COMPREHENSIVE METABOLIC PANEL
ALT: 30 U/L (ref 0–44)
AST: 17 U/L (ref 15–41)
Albumin: 3.9 g/dL (ref 3.5–5.0)
Alkaline Phosphatase: 96 U/L (ref 38–126)
Anion gap: 9 (ref 5–15)
BUN: 15 mg/dL (ref 8–23)
CO2: 26 mmol/L (ref 22–32)
Calcium: 9.3 mg/dL (ref 8.9–10.3)
Chloride: 100 mmol/L (ref 98–111)
Creatinine, Ser: 1.4 mg/dL — ABNORMAL HIGH (ref 0.61–1.24)
GFR, Estimated: 55 mL/min — ABNORMAL LOW (ref >60)
Glucose, Bld: 168 mg/dL — ABNORMAL HIGH (ref 70–99)
Potassium: 4.3 mmol/L (ref 3.5–5.1)
Sodium: 135 mmol/L (ref 135–145)
Total Bilirubin: 0.5 mg/dL (ref 0.3–1.2)
Total Protein: 8.3 g/dL — ABNORMAL HIGH (ref 6.5–8.1)

## 2020-04-07 LAB — LACTATE DEHYDROGENASE: LDH: 86 U/L — ABNORMAL LOW (ref 98–192)

## 2020-04-08 LAB — KAPPA/LAMBDA LIGHT CHAINS
Kappa free light chain: 30.2 mg/L — ABNORMAL HIGH (ref 3.3–19.4)
Kappa, lambda light chain ratio: 0.6 (ref 0.26–1.65)
Lambda free light chains: 50.5 mg/L — ABNORMAL HIGH (ref 5.7–26.3)

## 2020-04-09 LAB — PROTEIN ELECTROPHORESIS, SERUM
A/G Ratio: 1 (ref 0.7–1.7)
Albumin ELP: 3.8 g/dL (ref 2.9–4.4)
Alpha-1-Globulin: 0.2 g/dL (ref 0.0–0.4)
Alpha-2-Globulin: 0.7 g/dL (ref 0.4–1.0)
Beta Globulin: 1.3 g/dL (ref 0.7–1.3)
Gamma Globulin: 1.7 g/dL (ref 0.4–1.8)
Globulin, Total: 3.9 g/dL (ref 2.2–3.9)
M-Spike, %: 1.3 g/dL — ABNORMAL HIGH
Total Protein ELP: 7.7 g/dL (ref 6.0–8.5)

## 2020-04-14 ENCOUNTER — Inpatient Hospital Stay (HOSPITAL_BASED_OUTPATIENT_CLINIC_OR_DEPARTMENT_OTHER): Payer: Medicare HMO | Admitting: Oncology

## 2020-04-14 ENCOUNTER — Other Ambulatory Visit: Payer: Self-pay

## 2020-04-14 VITALS — BP 147/73 | HR 73 | Temp 96.9°F | Resp 18 | Wt 213.6 lb

## 2020-04-14 DIAGNOSIS — C9000 Multiple myeloma not having achieved remission: Secondary | ICD-10-CM

## 2020-04-14 DIAGNOSIS — D472 Monoclonal gammopathy: Secondary | ICD-10-CM

## 2020-04-14 NOTE — Progress Notes (Signed)
  [facility] OFFICE PROGRESS NOTE   Diagnosis: Smoldering myeloma  INTERVAL HISTORY:   Corey Murphy returns for a scheduled visit.  He feels well.  No complaint.  No recent infection.  No consistent pain.  Good appetite.  He has received the COVID-19 and influenza vaccines.  Objective:  Vital signs in last 24 hours:  Blood pressure (!) 147/73, pulse 73, temperature (!) 96.9 F (36.1 C), temperature source Temporal, resp. rate 18, weight 213 lb 9.6 oz (96.9 kg), SpO2 99 %.    Lymphatics: No cervical, supraclavicular, axillary, or inguinal nodes Resp: Lungs clear bilaterally Cardio: Regular rate and rhythm GI: No mass, nontender, no hepatosplenomegaly Vascular: No leg edema   Lab Results:  Lab Results  Component Value Date   WBC 4.9 04/07/2020   HGB 13.6 04/07/2020   HCT 41.9 04/07/2020   MCV 95.7 04/07/2020   PLT 248 04/07/2020   NEUTROABS 2.4 04/07/2020    CMP  Lab Results  Component Value Date   NA 135 04/07/2020   K 4.3 04/07/2020   CL 100 04/07/2020   CO2 26 04/07/2020   GLUCOSE 168 (H) 04/07/2020   BUN 15 04/07/2020   CREATININE 1.40 (H) 04/07/2020   CALCIUM 9.3 04/07/2020   PROT 8.3 (H) 04/07/2020   ALBUMIN 3.9 04/07/2020   AST 17 04/07/2020   ALT 30 04/07/2020   ALKPHOS 96 04/07/2020   BILITOT 0.5 04/07/2020   GFRNONAA 55 (L) 04/07/2020   GFRAA 52 (L) 12/05/2019     Medications: I have reviewed the patient's current medications.   Assessment/Plan: 1.  Smoldering multiple myeloma, IgG lambda -no clinical or laboratory evidence of disease progression, the plan is to continue observation.  He will be referred for a metastatic bone survey in February 2022.  2.  Renal insufficiency 3.  Nonischemic cardiomyopathy 4.  Hypertension  Disposition: [name] has a history of smoldering multiple myeloma.  There is no clinical or laboratory evidence of disease progression.  The plan is to continue observation.  He will be scheduled for a  metastatic bone survey in February 2022.  He will return for an office and lab visit in 6 months.  Betsy Coder, MD  04/14/2020  3:13 PM

## 2020-04-30 DIAGNOSIS — E1122 Type 2 diabetes mellitus with diabetic chronic kidney disease: Secondary | ICD-10-CM | POA: Diagnosis not present

## 2020-04-30 DIAGNOSIS — I5032 Chronic diastolic (congestive) heart failure: Secondary | ICD-10-CM | POA: Diagnosis not present

## 2020-04-30 DIAGNOSIS — N183 Chronic kidney disease, stage 3 unspecified: Secondary | ICD-10-CM | POA: Diagnosis not present

## 2020-04-30 DIAGNOSIS — I130 Hypertensive heart and chronic kidney disease with heart failure and stage 1 through stage 4 chronic kidney disease, or unspecified chronic kidney disease: Secondary | ICD-10-CM | POA: Diagnosis not present

## 2020-06-01 ENCOUNTER — Other Ambulatory Visit: Payer: Self-pay

## 2020-06-01 ENCOUNTER — Ambulatory Visit (HOSPITAL_COMMUNITY)
Admission: RE | Admit: 2020-06-01 | Discharge: 2020-06-01 | Disposition: A | Discharge status: Home / Self Care | Payer: Medicare HMO | Source: Ambulatory Visit | Attending: Oncology | Admitting: Oncology

## 2020-06-01 DIAGNOSIS — C9000 Multiple myeloma not having achieved remission: Secondary | ICD-10-CM | POA: Diagnosis not present

## 2020-06-01 DIAGNOSIS — D472 Monoclonal gammopathy: Secondary | ICD-10-CM

## 2020-06-28 DIAGNOSIS — E1122 Type 2 diabetes mellitus with diabetic chronic kidney disease: Secondary | ICD-10-CM | POA: Diagnosis not present

## 2020-06-28 DIAGNOSIS — I130 Hypertensive heart and chronic kidney disease with heart failure and stage 1 through stage 4 chronic kidney disease, or unspecified chronic kidney disease: Secondary | ICD-10-CM | POA: Diagnosis not present

## 2020-06-28 DIAGNOSIS — I5032 Chronic diastolic (congestive) heart failure: Secondary | ICD-10-CM | POA: Diagnosis not present

## 2020-06-28 DIAGNOSIS — N183 Chronic kidney disease, stage 3 unspecified: Secondary | ICD-10-CM | POA: Diagnosis not present

## 2020-07-08 ENCOUNTER — Encounter: Payer: Self-pay | Admitting: Internal Medicine

## 2020-08-02 DIAGNOSIS — I10 Essential (primary) hypertension: Secondary | ICD-10-CM | POA: Diagnosis not present

## 2020-08-02 DIAGNOSIS — G894 Chronic pain syndrome: Secondary | ICD-10-CM | POA: Diagnosis not present

## 2020-08-02 DIAGNOSIS — Z23 Encounter for immunization: Secondary | ICD-10-CM | POA: Diagnosis not present

## 2020-08-02 DIAGNOSIS — E7849 Other hyperlipidemia: Secondary | ICD-10-CM | POA: Diagnosis not present

## 2020-08-02 DIAGNOSIS — E1142 Type 2 diabetes mellitus with diabetic polyneuropathy: Secondary | ICD-10-CM | POA: Diagnosis not present

## 2020-08-02 DIAGNOSIS — Z1331 Encounter for screening for depression: Secondary | ICD-10-CM | POA: Diagnosis not present

## 2020-08-02 DIAGNOSIS — Z6832 Body mass index (BMI) 32.0-32.9, adult: Secondary | ICD-10-CM | POA: Diagnosis not present

## 2020-08-02 DIAGNOSIS — I5032 Chronic diastolic (congestive) heart failure: Secondary | ICD-10-CM | POA: Diagnosis not present

## 2020-08-02 DIAGNOSIS — Z7689 Persons encountering health services in other specified circumstances: Secondary | ICD-10-CM | POA: Diagnosis not present

## 2020-08-03 DIAGNOSIS — Z1331 Encounter for screening for depression: Secondary | ICD-10-CM | POA: Diagnosis not present

## 2020-08-03 DIAGNOSIS — E1142 Type 2 diabetes mellitus with diabetic polyneuropathy: Secondary | ICD-10-CM | POA: Diagnosis not present

## 2020-08-03 DIAGNOSIS — I5032 Chronic diastolic (congestive) heart failure: Secondary | ICD-10-CM | POA: Diagnosis not present

## 2020-08-03 DIAGNOSIS — Z23 Encounter for immunization: Secondary | ICD-10-CM | POA: Diagnosis not present

## 2020-08-03 DIAGNOSIS — E7849 Other hyperlipidemia: Secondary | ICD-10-CM | POA: Diagnosis not present

## 2020-08-03 DIAGNOSIS — Z6832 Body mass index (BMI) 32.0-32.9, adult: Secondary | ICD-10-CM | POA: Diagnosis not present

## 2020-08-03 DIAGNOSIS — I10 Essential (primary) hypertension: Secondary | ICD-10-CM | POA: Diagnosis not present

## 2020-08-03 DIAGNOSIS — G894 Chronic pain syndrome: Secondary | ICD-10-CM | POA: Diagnosis not present

## 2020-08-24 ENCOUNTER — Encounter: Payer: Self-pay | Admitting: Internal Medicine

## 2020-08-24 ENCOUNTER — Ambulatory Visit: Payer: Medicare HMO | Admitting: Internal Medicine

## 2020-08-24 ENCOUNTER — Other Ambulatory Visit: Payer: Self-pay

## 2020-08-24 VITALS — BP 119/78 | HR 80 | Temp 97.0°F | Ht 67.0 in | Wt 200.8 lb

## 2020-08-24 DIAGNOSIS — K921 Melena: Secondary | ICD-10-CM

## 2020-08-24 DIAGNOSIS — K625 Hemorrhage of anus and rectum: Secondary | ICD-10-CM | POA: Diagnosis not present

## 2020-08-24 NOTE — Progress Notes (Signed)
Primary Care Physician:  Redmond School, MD Primary Gastroenterologist:  Dr. Manuela Schwartz  Pre-Procedure History & Physical: HPI:  Corey Murphy is a 70 y.o. male here for follow-up of paper hematochezia.  Patient has long history of intermittent paper hematochezia in the setting of constipation.  Takes various OTC laxatives on demand as needed for constipation.  He may go several days without a bowel movement particularly when he eats too much red meat.  He is up-to-date on colonoscopy.  History of multiple colonic adenomas;  known hemorrhoids which amount to grade 2 on last colonoscopy -  2020 (1 adenoma and diverticulosis also found).  He is due for a surveillance colonoscopy 2025.  He is interested in hemorrhoid banding.  He bleeds no more thanonce every 2 to 3 weeks but when it does happen it is bothersome.  He is not anticoagulated.  There is no history of radiation treatment to his rectum. He does take an aspirin daily.   Past Medical History:  Diagnosis Date  . Alcohol dependency ([place])   . Chronic kidney disease   . Colonic adenoma 2006  . Essential hypertension   . History of cardiac catheterization    03/2013 LM nl, LAD nl, D1/2/3 small - nl, LCX nondom - nl, OM1/2/3 nl, RCA dom  . Hypercholesteremia   . 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 Nonischemic cardiomyopathy (H[facility])    LVEF approximately 40% 06/2013 - improved to normal range  . Obesity   . Type 2 diabetes mellitus ([place])     Past Surgical History:  Procedure Laterality Date  . CIRCUMCISION    . COLONOSCOPY  05/20/2004   RMR: Internal hemorrhoids.  Diminutive adenomatous polyp in the mid-right colon, otherwise normal  . COLONOSCOPY N/A 06/19/2012   Procedure: COLONOSCOPY;  Surgeon: Daneil Dolin, MD;  [facility];  Service: Endoscopy;  Laterality: N/A;  8:30AM-rescheduled 10:30am Darius Bump notified pt  . COLONOSCOPY N/A 08/08/2017   Surgeon: Daneil Dolin, MD;  diverticulosis in sigmoid, descending,  and transverse colon, three 4-7 mm polyps in ascending colon, and one 9 mm polyp in ascending colon. Pathology with tubular adenomas. Repeat in 3 years.  . COLONOSCOPY N/A 04/23/2019   Sigmoid and descending colon diverticulosis, one 5 mm polyp (tubular adenoma) in descending colon, non-bleeding internal hemorrhoids. 5 year surveillance.   . Ileocolonoscopy  06/28/2007   ENI:DPOEUM rectum/Submucosal sigmoid diverticula (chronic), doubt clinical significance Hyperplastic polypoid-appearing fold, mid ascending colon, status/Remainder colonic mucosa and terminal ileum mucosa appeared normal   . LEFT AND RIGHT HEART CATHETERIZATION WITH CORONARY ANGIOGRAM  03/19/2013   Procedure: LEFT AND RIGHT HEART CATHETERIZATION WITH CORONARY ANGIOGRAM;  Surgeon: Wellington Hampshire, MD;  Location: [place] CATH LAB;  Service: Cardiovascular;;  . POLYPECTOMY  08/08/2017   Procedure: POLYPECTOMY;  Surgeon: Daneil Dolin, MD;  [facility];  Service: Endoscopy;;  ascending colon polyps x4 (cold snare-3,hot snare)  . TOTAL HIP ARTHROPLASTY  2010   Left  . TOTAL HIP ARTHROPLASTY  2010   Right    Prior to Admission medications   Medication Sig Start Date End Date Taking? Authorizing Provider  aspirin 81 MG tablet Take 81 mg by mouth daily.   Yes [provider]  carvedilol (COREG) 12.5 MG tablet TAKE 1 TABLET BY MOUTH 2 TIMES DAILY WITH A MEAL. 02/26/20  Yes Satira Sark, MD  furosemide (LASIX) 20 MG tablet TAKE 1 TABLET BY MOUTH EVERY DAY 01/06/20  Yes Satira Sark, MD  glimepiride (AMARYL) 2 MG tablet Take 2 mg by mouth 2 (two) times a day.    Yes [provider]  HYDROcodone-acetaminophen (NORCO) 10-325 MG tablet Take 1 tablet by mouth every 6 (six) hours as needed for moderate pain.    Yes [provider]  lisinopril (PRINIVIL,ZESTRIL) 2.5 MG tablet Take 2.5 mg by mouth in the morning and at bedtime.    Yes [provider]  Multiple Vitamins-Iron  (MULTIVITAMIN/IRON PO) Take 1 tablet by mouth daily.    Yes [provider]  [facility] ULTRA test strip 1 each 2 (two) times daily. 03/24/20  Yes [provider]  simvastatin (ZOCOR) 20 MG tablet Take 20 mg by mouth daily.  10/14/15  Yes [provider]  spironolactone (ALDACTONE) 25 MG tablet Take 12.5 mg by mouth daily.   Yes [provider]  traZODone (DESYREL) 50 MG tablet Take 25 mg by mouth at bedtime. 01/29/20  Yes [provider]  zolpidem (AMBIEN) 10 MG tablet Take 10 mg by mouth at bedtime. 06/12/20  Yes [provider]    Allergies as of 08/24/2020 - Review Complete 08/24/2020  Allergen Reaction Noted  . Other  11/28/2018  . Peanut-containing drug products Rash 02/17/2019    Family History  Problem Relation Age of Onset  . Heart failure Mother   . Diabetes Mother   . Hypertension Mother   . Heart failure Sister   . Hypertension Sister   . Cancer Sister   . Colon cancer Neg Hx     Social History   Socioeconomic History  . Marital status: Married    Spouse name: Not on file  . Number of children: 2  . Years of education: Not on file  . Highest education level: Not on file  Occupational History  . Occupation: Retired, Arts development officer  . Smoking status: Former Smoker    Packs/day: 1.00    Years: 12.00    Pack years: 12.00    Types: Cigarettes    Quit date: 05/28/2008    Years since quitting: 12.2  . Smokeless tobacco: Never Used  Vaping Use  . Vaping Use: Never used  Substance and Sexual Activity  . Alcohol use: Yes    Comment: Occasionally 2 to 3 times a month  . Drug use: Yes    Types: Marijuana    Comment: Smoked via a bowl occassionally.   . Sexual activity: Not on file  Other Topics Concern  . Not on file  Social History Narrative  . Not on file   Social Determinants of Health   Financial Resource Strain: Not on file  Food Insecurity: No Food Insecurity  . Worried About Sales executive in the Last Year: Never true  . Ran Out of Food in the Last Year: Never true  Transportation Needs: No Transportation Needs  . Lack of Transportation (Medical): No  . Lack of Transportation (Non-Medical): No  Physical Activity: Inactive  . Days of Exercise per Week: 0 days  . Minutes of Exercise per Session: 0 min  Stress: No Stress Concern Present  . Feeling of Stress : Not at all  Social Connections: Moderately Integrated  . Frequency of Communication with Friends and Family: More than three times a week  . Frequency of Social Gatherings with Friends and Family: Three times a week  . Attends Religious Services: 1 to 4 times per year  . Active Member of Clubs or Organizations: No  . Attends Club or  Organization Meetings: Never  . Marital Status: Married  Human resources officer Violence: Not At Risk  . Fear of Current or Ex-Partner: No  . Emotionally Abused: No  . Physically Abused: No  . Sexually Abused: No    Review of Systems: See HPI, otherwise negative ROS  Physical Exam: BP 119/78   Pulse 80   Temp (!) 97 F (36.1 C)   Ht 5\' 7"  (1.702 m)   Wt 200 lb 12.8 oz (91.1 kg)   BMI 31.45 kg/m  General:   Alert, pleasant and in no acute distress.  Accompanied by his wife. Neck:  Supple; no masses or thyromegaly. No significant cervical adenopathy. Lungs:  Clear throughout to auscultation.   No wheezes, crackles, or rhonchi. No acute distress. Heart:  Regular rate and rhythm; no murmurs, clicks, rubs,  or gallops. Abdomen: Non-distended, normal bowel sounds.  Soft and nontender without appreciable mass or hepatosplenomegaly.  Pulses:  Normal pulses noted. Extremities:  Without clubbing or edema.  Impression: Very pleasant 70 year old gentleman with intermittent constipation.  Intermittent paper hematochezia likely secondary to hemorrhoids.  Is up-to-date on colonoscopy.  History of diverticulosis and colonic adenoma.  We discussed the various therapies for hemorrhoid  management.  The importance of successful management of constipation is really a prerequisite to successful management of hemorrhoid disease.  I feel he would be a reasonably good hemorrhoid candidate.  Ideally, we need to get his bowel function normalized prior to proceeding with banding.  I have discussed the risk benefits and limitations of hemorrhoid banding at length here in the office today.  He is interested and getting more information and likely proceeding.  Recommendations:  Pamphlet on hemorrhoid banding provided today  Questionnaire to be filled out for hemorrhoid banding  Need to take fiber every day whether you feel like you need it or not to facilitate normal bowel function  I have recommended taking Metamucil-once every day.  Take it every day whether patient feels he needs it or not.    We will have you follow-up with Roseanne Kaufman, PhD in 2 to 3 weeks to have your first band placed  No need for a repeat colonoscopy until 2025     Notice: This dictation was prepared with Dragon dictation along with smaller phrase technology. Any transcriptional errors that result from this process are unintentional and may not be corrected upon review.

## 2020-08-24 NOTE — Patient Instructions (Signed)
Pamphlet on hemorrhoid banding provided today  Questionnaire to be filled out for hemorrhoid banding  Need to take fiber every day whether you feel like you need it or not to facilitate normal bowel function  Metamucil will be fine-take 1 dose daily  We will have you follow-up with Roseanne Kaufman, PhD in 2 to 3 weeks to have your first band placed  No need for a repeat colonoscopy until 2025

## 2020-08-28 DIAGNOSIS — E1122 Type 2 diabetes mellitus with diabetic chronic kidney disease: Secondary | ICD-10-CM | POA: Diagnosis not present

## 2020-08-28 DIAGNOSIS — N183 Chronic kidney disease, stage 3 unspecified: Secondary | ICD-10-CM | POA: Diagnosis not present

## 2020-08-28 DIAGNOSIS — I5032 Chronic diastolic (congestive) heart failure: Secondary | ICD-10-CM | POA: Diagnosis not present

## 2020-08-28 DIAGNOSIS — I130 Hypertensive heart and chronic kidney disease with heart failure and stage 1 through stage 4 chronic kidney disease, or unspecified chronic kidney disease: Secondary | ICD-10-CM | POA: Diagnosis not present

## 2020-09-28 DIAGNOSIS — I130 Hypertensive heart and chronic kidney disease with heart failure and stage 1 through stage 4 chronic kidney disease, or unspecified chronic kidney disease: Secondary | ICD-10-CM | POA: Diagnosis not present

## 2020-09-28 DIAGNOSIS — E1122 Type 2 diabetes mellitus with diabetic chronic kidney disease: Secondary | ICD-10-CM | POA: Diagnosis not present

## 2020-09-28 DIAGNOSIS — I5032 Chronic diastolic (congestive) heart failure: Secondary | ICD-10-CM | POA: Diagnosis not present

## 2020-09-28 DIAGNOSIS — N183 Chronic kidney disease, stage 3 unspecified: Secondary | ICD-10-CM | POA: Diagnosis not present

## 2020-10-06 ENCOUNTER — Other Ambulatory Visit (HOSPITAL_COMMUNITY): Payer: Self-pay | Admitting: *Deleted

## 2020-10-06 DIAGNOSIS — C9000 Multiple myeloma not having achieved remission: Secondary | ICD-10-CM

## 2020-10-06 DIAGNOSIS — D472 Monoclonal gammopathy: Secondary | ICD-10-CM

## 2020-10-07 ENCOUNTER — Other Ambulatory Visit: Payer: Self-pay

## 2020-10-07 ENCOUNTER — Inpatient Hospital Stay (HOSPITAL_COMMUNITY): Payer: Medicare HMO | Attending: Hematology

## 2020-10-07 DIAGNOSIS — I129 Hypertensive chronic kidney disease with stage 1 through stage 4 chronic kidney disease, or unspecified chronic kidney disease: Secondary | ICD-10-CM | POA: Insufficient documentation

## 2020-10-07 DIAGNOSIS — E1122 Type 2 diabetes mellitus with diabetic chronic kidney disease: Secondary | ICD-10-CM | POA: Insufficient documentation

## 2020-10-07 DIAGNOSIS — E7800 Pure hypercholesterolemia, unspecified: Secondary | ICD-10-CM | POA: Insufficient documentation

## 2020-10-07 DIAGNOSIS — Z7984 Long term (current) use of oral hypoglycemic drugs: Secondary | ICD-10-CM | POA: Diagnosis not present

## 2020-10-07 DIAGNOSIS — N189 Chronic kidney disease, unspecified: Secondary | ICD-10-CM | POA: Insufficient documentation

## 2020-10-07 DIAGNOSIS — D472 Monoclonal gammopathy: Secondary | ICD-10-CM

## 2020-10-07 DIAGNOSIS — Z7982 Long term (current) use of aspirin: Secondary | ICD-10-CM | POA: Diagnosis not present

## 2020-10-07 DIAGNOSIS — Z79899 Other long term (current) drug therapy: Secondary | ICD-10-CM | POA: Insufficient documentation

## 2020-10-07 DIAGNOSIS — C9000 Multiple myeloma not having achieved remission: Secondary | ICD-10-CM | POA: Diagnosis not present

## 2020-10-07 LAB — COMPREHENSIVE METABOLIC PANEL
ALT: 27 U/L (ref 0–44)
AST: 18 U/L (ref 15–41)
Albumin: 3.5 g/dL (ref 3.5–5.0)
Alkaline Phosphatase: 88 U/L (ref 38–126)
Anion gap: 5 (ref 5–15)
BUN: 18 mg/dL (ref 8–23)
CO2: 28 mmol/L (ref 22–32)
Calcium: 9.3 mg/dL (ref 8.9–10.3)
Chloride: 106 mmol/L (ref 98–111)
Creatinine, Ser: 1.48 mg/dL — ABNORMAL HIGH (ref 0.61–1.24)
GFR, Estimated: 52 mL/min — ABNORMAL LOW (ref >60)
Glucose, Bld: 155 mg/dL — ABNORMAL HIGH (ref 70–99)
Potassium: 4.5 mmol/L (ref 3.5–5.1)
Sodium: 139 mmol/L (ref 135–145)
Total Bilirubin: 0.5 mg/dL (ref 0.3–1.2)
Total Protein: 7.7 g/dL (ref 6.5–8.1)

## 2020-10-07 LAB — CBC WITH DIFFERENTIAL/PLATELET
Abs Immature Granulocytes: 0.02 10*3/uL (ref 0.00–0.07)
Basophils Absolute: 0 10*3/uL (ref 0.0–0.1)
Basophils Relative: 1 %
Eosinophils Absolute: 0.1 10*3/uL (ref 0.0–0.5)
Eosinophils Relative: 2 %
HCT: 42.7 % (ref 39.0–52.0)
Hemoglobin: 13.9 g/dL (ref 13.0–17.0)
Immature Granulocytes: 0 %
Lymphocytes Relative: 41 %
Lymphs Abs: 2.3 10*3/uL (ref 0.7–4.0)
MCH: 31.3 pg (ref 26.0–34.0)
MCHC: 32.6 g/dL (ref 30.0–36.0)
MCV: 96.2 fL (ref 80.0–100.0)
Monocytes Absolute: 0.5 10*3/uL (ref 0.1–1.0)
Monocytes Relative: 9 %
Neutro Abs: 2.6 10*3/uL (ref 1.7–7.7)
Neutrophils Relative %: 47 %
Platelets: 304 10*3/uL (ref 150–400)
RBC: 4.44 MIL/uL (ref 4.22–5.81)
RDW: 14 % (ref 11.5–15.5)
WBC: 5.6 10*3/uL (ref 4.0–10.5)
nRBC: 0 % (ref 0.0–0.2)

## 2020-10-08 LAB — PROTEIN ELECTROPHORESIS, SERUM
A/G Ratio: 0.9 (ref 0.7–1.7)
Albumin ELP: 3.5 g/dL (ref 2.9–4.4)
Alpha-1-Globulin: 0.2 g/dL (ref 0.0–0.4)
Alpha-2-Globulin: 0.7 g/dL (ref 0.4–1.0)
Beta Globulin: 1.2 g/dL (ref 0.7–1.3)
Gamma Globulin: 1.5 g/dL (ref 0.4–1.8)
Globulin, Total: 3.7 g/dL (ref 2.2–3.9)
M-Spike, %: 1.2 g/dL — ABNORMAL HIGH
Total Protein ELP: 7.2 g/dL (ref 6.0–8.5)

## 2020-10-08 LAB — KAPPA/LAMBDA LIGHT CHAINS
Kappa free light chain: 27.6 mg/L — ABNORMAL HIGH (ref 3.3–19.4)
Kappa, lambda light chain ratio: 0.56 (ref 0.26–1.65)
Lambda free light chains: 48.9 mg/L — ABNORMAL HIGH (ref 5.7–26.3)

## 2020-10-13 NOTE — Progress Notes (Signed)
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, a 70 y.o. male, returns for routine follow-up for his smoldering IgG lambda multiple myeloma. Corey Murphy was last seen on 12/10/2019.  Today he reports feeling good. He denies developing any new pains or infections. He denies history of melanomas or skin cancers.   REVIEW OF SYSTEMS:  Review of Systems  Constitutional:  Negative for appetite change and fatigue.  All other systems reviewed and are negative.  PAST MEDICAL/SURGICAL HISTORY:  Past Medical History:  Diagnosis Date   Alcohol dependency ([place])    Chronic kidney disease    Colonic adenoma 2006   Essential hypertension    History of cardiac catheterization    03/2013 LM nl, LAD nl, D1/2/3 small - nl, LCX nondom - nl, OM1/2/3 nl, RCA dom   Hypercholesteremia    IgG lambda monoclonal gammopathy 06/24/2015   Nonischemic cardiomyopathy ([place])    LVEF approximately 40% 06/2013 - improved to normal range   Obesity    Type 2 diabetes mellitus ([place])    Past Surgical History:  Procedure Laterality Date   CIRCUMCISION     COLONOSCOPY  05/20/2004   RMR: Internal hemorrhoids.  Diminutive adenomatous polyp in the mid-right colon, otherwise normal   COLONOSCOPY N/A 06/19/2012   Procedure: COLONOSCOPY;  Surgeon: Daneil Dolin, MD;  [facility];  Service: Endoscopy;  Laterality: N/A;  8:30AM-rescheduled 10:30am Darius Bump notified pt   COLONOSCOPY N/A 08/08/2017   Surgeon: Daneil Dolin, MD;  diverticulosis in sigmoid, descending, and transverse colon, three 4-7 mm polyps in ascending colon, and one 9 mm polyp in ascending colon. Pathology with tubular adenomas. Repeat  in 3 years.   COLONOSCOPY N/A 04/23/2019   Sigmoid and descending colon diverticulosis, one 5 mm polyp (tubular adenoma) in descending colon, non-bleeding internal hemorrhoids. 5 year surveillance.    Ileocolonoscopy  06/28/2007   INO:[facility] rectum/Submucosal sigmoid diverticula (chronic), doubt clinical significance Hyperplastic polypoid-appearing fold, mid ascending colon, status/Remainder colonic mucosa and terminal ileum mucosa appeared normal    LEFT AND RIGHT HEART CATHETERIZATION WITH CORONARY ANGIOGRAM  03/19/2013   Procedure: LEFT AND RIGHT HEART CATHETERIZATION WITH CORONARY ANGIOGRAM;  Surgeon: Wellington Hampshire, MD;  Location: [facility] CATH LAB;  Service: Cardiovascular;;   POLYPECTOMY  08/08/2017   Procedure: POLYPECTOMY;  Surgeon: Daneil Dolin, MD;  [facility];  Service: Endoscopy;;  ascending colon polyps x4 (cold snare-3,hot snare)   TOTAL HIP ARTHROPLASTY  2010   Left   TOTAL HIP ARTHROPLASTY  2010   Right    SOCIAL HISTORY:  Social History   Socioeconomic History   Marital status: Married    Spouse name: Not on file   Number of children: 2   Years of education: Not on file   Highest education level: Not on file  Occupational History   Occupation: Retired, Psychologist, educational  Tobacco Use   Smoking status: Former    Packs/day: 1.00    Years: 12.00    Pack years: 12.00    Types: Cigarettes    Quit date: 05/28/2008    Years since quitting: 12.3   Smokeless tobacco: Never  Vaping Use   Vaping Use: Never used  Substance and  Sexual Activity   Alcohol use: Yes    Comment: Occasionally 2 to 3 times a month   Drug use: Yes    Types: Marijuana    Comment: Smoked via a bowl occassionally.    Sexual activity: Not on file  Other Topics Concern   Not on file  Social History Narrative   Not on file   Social Determinants of Health   Financial Resource Strain: Not on file  Food Insecurity: No Food Insecurity   Worried About Running Out of Food in the Last Year:  Never true   Ran Out of Food in the Last Year: Never true  Transportation Needs: No Transportation Needs   Lack of Transportation (Medical): No   Lack of Transportation (Non-Medical): No  Physical Activity: Inactive   Days of Exercise per Week: 0 days   Minutes of Exercise per Session: 0 min  Stress: No Stress Concern Present   Feeling of Stress : Not at all  Social Connections: Moderately Integrated   Frequency of Communication with Friends and Family: More than three times a week   Frequency of Social Gatherings with Friends and Family: Three times a week   Attends Religious Services: 1 to 4 times per year   Active Member of Clubs or Organizations: No   Attends Music therapist: Never   Marital Status: Married  Human resources officer Violence: Not At Risk   Fear of Current or Ex-Partner: No   Emotionally Abused: No   Physically Abused: No   Sexually Abused: No    FAMILY HISTORY:  Family History  Problem Relation Age of Onset   Heart failure Mother    Diabetes Mother    Hypertension Mother    Heart failure Sister    Hypertension Sister    Cancer Sister    Colon cancer Neg Hx     CURRENT MEDICATIONS:  Current Outpatient Medications  Medication Sig Dispense Refill   aspirin 81 MG tablet Take 81 mg by mouth daily.     carvedilol (COREG) 12.5 MG tablet TAKE 1 TABLET BY MOUTH 2 TIMES DAILY WITH A MEAL. 180 tablet 3   furosemide (LASIX) 20 MG tablet TAKE 1 TABLET BY MOUTH EVERY DAY 90 tablet 3   glimepiride (AMARYL) 2 MG tablet Take 2 mg by mouth 2 (two) times a day.      HYDROcodone-acetaminophen (NORCO) 10-325 MG tablet Take 1 tablet by mouth every 6 (six) hours as needed for moderate pain.      lisinopril (PRINIVIL,ZESTRIL) 2.5 MG tablet Take 2.5 mg by mouth in the morning and at bedtime.      Multiple Vitamins-Iron (MULTIVITAMIN/IRON PO) Take 1 tablet by mouth daily.      ONETOUCH ULTRA test strip 1 each 2 (two) times daily.     simvastatin (ZOCOR) 20 MG tablet  Take 20 mg by mouth daily.   3   spironolactone (ALDACTONE) 25 MG tablet Take 12.5 mg by mouth daily.     traZODone (DESYREL) 50 MG tablet Take 25 mg by mouth at bedtime.     zolpidem (AMBIEN) 10 MG tablet Take 10 mg by mouth at bedtime.     No current facility-administered medications for this visit.    ALLERGIES:  Allergies  Allergen Reactions   Other    Peanut-Containing Drug Products Rash    *Cashews    PHYSICAL EXAM:  Performance status (ECOG): 1 - Symptomatic but completely ambulatory  There were no vitals filed for this visit. Wt Readings from  Last 3 Encounters:  08/24/20 200 lb 12.8 oz (91.1 kg)  04/14/20 213 lb 9.6 oz (96.9 kg)  12/10/19 206 lb 8 oz (93.7 kg)   Physical Exam Vitals reviewed.  Constitutional:      Appearance: Normal appearance.  Cardiovascular:     Rate and Rhythm: Normal rate and regular rhythm.     Pulses: Normal pulses.     Heart sounds: Normal heart sounds.  Pulmonary:     Effort: Pulmonary effort is normal.     Breath sounds: Normal breath sounds.  Neurological:     General: No focal deficit present.     Mental Status: He is alert and oriented to person, place, and time.  Psychiatric:        Mood and Affect: Mood normal.        Behavior: Behavior normal.    LABORATORY DATA:  I have reviewed the labs as listed.  CBC Latest Ref Rng & Units 10/07/2020 04/07/2020 12/05/2019  WBC 4.0 - 10.5 K/uL 5.6 4.9 5.1  Hemoglobin 13.0 - 17.0 g/dL 13.9 13.6 14.2  Hematocrit 39.0 - 52.0 % 42.7 41.9 44.3  Platelets 150 - 400 K/uL 304 248 277   CMP Latest Ref Rng & Units 10/07/2020 04/07/2020 12/05/2019  Glucose 70 - 99 mg/dL 155(H) 168(H) 203(H)  BUN 8 - 23 mg/dL 18 15 29(H)  Creatinine 0.61 - 1.24 mg/dL 1.48(H) 1.40(H) 1.57(H)  Sodium 135 - 145 mmol/L 139 135 137  Potassium 3.5 - 5.1 mmol/L 4.5 4.3 4.4  Chloride 98 - 111 mmol/L 106 100 102  CO2 22 - 32 mmol/L _0 Calcium 8.9 - 10.3 mg/dL 9.3 9.3 9.4  Total Protein 6.5 - 8.1 g/dL 7.7 8.3(H) 8.5(H)   Total Bilirubin 0.3 - 1.2 mg/dL 0.5 0.5 0.5  Alkaline Phos 38 - 126 U/L 88 96 86  AST 15 - 41 U/L _1 ALT 0 - 44 U/L 27 30 34      Component Value Date/Time   RBC 4.44 10/07/2020 1340   MCV 96.2 10/07/2020 1340   MCH 31.3 10/07/2020 1340   MCHC 32.6 10/07/2020 1340   RDW 14.0 10/07/2020 1340   LYMPHSABS 2.3 10/07/2020 1340   MONOABS 0.5 10/07/2020 1340   EOSABS 0.1 10/07/2020 1340   BASOSABS 0.0 10/07/2020 1340    DIAGNOSTIC IMAGING:  I have independently reviewed the scans and discussed with the patient. No results found.   ASSESSMENT:  1.  IgG lambda smoldering multiple myeloma: -BM BX on 07/08/2015 shows normocellular marrow with monoclonal plasmacytosis, 10-20%, 46, XY, myeloma FISH panel negative -Skeletal survey on 06/04/2019 was negative for lytic lesions.   2.  CKD: -Baseline creatinine between 1.4-1.6.   PLAN:  1.  IgG lambda smoldering multiple myeloma: - He does not report any new onset pains.  No recurrent infections. - Reviewed myeloma labs from 10/07/2020.  M spike improved to 1.2 g.  Free light chain ratio is normal at 0.56.  Lambda light chains are 49. - Skeletal survey from 06/01/2020 was negative for lytic lesions. - Continue monitoring in 6 months with repeat labs.  Plan to repeat skeletal survey in 1 year. - He reported a mole on the left upper back for several decades which has not changed.  I have discussed alarming features including change in color, size, itching, pain.  If they happen he was told to go go to dermatology.   2.  CKD: - Creatinine is stable at 1.48.  Recommend adequate hydration.  Orders placed this encounter:  No orders of the defined types were placed in this encounter.    Derek Jack, MD [place] [phone]   I, Thana Ates, am acting as a scribe for Dr. Derek Jack.  I, Derek Jack MD, have reviewed the above documentation for accuracy and completeness, and I agree with the  above.

## 2020-10-14 ENCOUNTER — Inpatient Hospital Stay (HOSPITAL_COMMUNITY): Payer: Medicare HMO | Admitting: Hematology

## 2020-10-14 ENCOUNTER — Other Ambulatory Visit: Payer: Self-pay

## 2020-10-14 VITALS — BP 145/78 | HR 85 | Temp 98.6°F | Resp 18 | Wt 203.1 lb

## 2020-10-14 DIAGNOSIS — E1122 Type 2 diabetes mellitus with diabetic chronic kidney disease: Secondary | ICD-10-CM | POA: Diagnosis not present

## 2020-10-14 DIAGNOSIS — Z79899 Other long term (current) drug therapy: Secondary | ICD-10-CM | POA: Diagnosis not present

## 2020-10-14 DIAGNOSIS — D472 Monoclonal gammopathy: Secondary | ICD-10-CM

## 2020-10-14 DIAGNOSIS — N189 Chronic kidney disease, unspecified: Secondary | ICD-10-CM | POA: Diagnosis not present

## 2020-10-14 DIAGNOSIS — C9000 Multiple myeloma not having achieved remission: Secondary | ICD-10-CM

## 2020-10-14 DIAGNOSIS — E7800 Pure hypercholesterolemia, unspecified: Secondary | ICD-10-CM | POA: Diagnosis not present

## 2020-10-14 DIAGNOSIS — Z7982 Long term (current) use of aspirin: Secondary | ICD-10-CM | POA: Diagnosis not present

## 2020-10-14 DIAGNOSIS — I129 Hypertensive chronic kidney disease with stage 1 through stage 4 chronic kidney disease, or unspecified chronic kidney disease: Secondary | ICD-10-CM | POA: Diagnosis not present

## 2020-10-14 DIAGNOSIS — Z7984 Long term (current) use of oral hypoglycemic drugs: Secondary | ICD-10-CM | POA: Diagnosis not present

## 2020-10-14 NOTE — Patient Instructions (Signed)
[facility] at [facility] Discharge Instructions  You were seen today by Dr. Delton Coombes. He went over your recent results. Hydrate your kidneys by drinking plenty of fluids. Dr. Delton Coombes will see you back in 6 months for labs and follow up.   Thank you for choosing [place] at [facility] to provide your oncology and hematology care.  To afford each patient quality time with our provider, please arrive at least 15 minutes before your scheduled appointment time.   If you have a lab appointment with the [facility] please come in thru the Main Entrance and check in at the main information desk  You need to re-schedule your appointment should you arrive 10 or more minutes late.  We strive to give you quality time with our providers, and arriving late affects you and other patients whose appointments are after yours.  Also, if you no show three or more times for appointments you may be dismissed from the clinic at the providers discretion.     Again, thank you for choosing [facility].  Our hope is that these requests will decrease the amount of time that you wait before being seen by our physicians.       _____________________________________________________________  Should you have questions after your visit to [facility], please contact our office at [phone] between the hours of 8:00 a.m. and 4:30 p.m.  Voicemails left after 4:00 p.m. will not be returned until the following business day.  For prescription refill requests, have your pharmacy contact our office and allow 72 hours.    Cancer Center Support Programs:   > Cancer Support Group  2nd Tuesday of the month 1pm-2pm, Journey Room

## 2020-10-28 DIAGNOSIS — N183 Chronic kidney disease, stage 3 unspecified: Secondary | ICD-10-CM | POA: Diagnosis not present

## 2020-10-28 DIAGNOSIS — E1122 Type 2 diabetes mellitus with diabetic chronic kidney disease: Secondary | ICD-10-CM | POA: Diagnosis not present

## 2020-10-28 DIAGNOSIS — I130 Hypertensive heart and chronic kidney disease with heart failure and stage 1 through stage 4 chronic kidney disease, or unspecified chronic kidney disease: Secondary | ICD-10-CM | POA: Diagnosis not present

## 2020-10-28 DIAGNOSIS — I5032 Chronic diastolic (congestive) heart failure: Secondary | ICD-10-CM | POA: Diagnosis not present

## 2020-11-24 ENCOUNTER — Encounter: Payer: Medicare HMO | Admitting: Gastroenterology

## 2020-12-14 DIAGNOSIS — E119 Type 2 diabetes mellitus without complications: Secondary | ICD-10-CM | POA: Diagnosis not present

## 2020-12-14 DIAGNOSIS — H524 Presbyopia: Secondary | ICD-10-CM | POA: Diagnosis not present

## 2020-12-14 DIAGNOSIS — H35361 Drusen (degenerative) of macula, right eye: Secondary | ICD-10-CM | POA: Diagnosis not present

## 2020-12-26 ENCOUNTER — Other Ambulatory Visit: Payer: Self-pay | Admitting: Cardiology

## 2021-01-05 ENCOUNTER — Other Ambulatory Visit: Payer: Self-pay

## 2021-01-05 ENCOUNTER — Ambulatory Visit: Payer: Medicare HMO | Admitting: Physician Assistant

## 2021-01-05 ENCOUNTER — Encounter: Payer: Self-pay | Admitting: Physician Assistant

## 2021-01-05 VITALS — BP 134/80 | HR 73 | Ht 67.0 in | Wt 203.0 lb

## 2021-01-05 DIAGNOSIS — I10 Essential (primary) hypertension: Secondary | ICD-10-CM

## 2021-01-05 DIAGNOSIS — I5042 Chronic combined systolic (congestive) and diastolic (congestive) heart failure: Secondary | ICD-10-CM

## 2021-01-05 MED ORDER — CARVEDILOL 12.5 MG PO TABS: 18.7500 mg | ORAL_TABLET | Freq: Two times a day (BID) | ORAL | 3 refills | Status: DC

## 2021-01-05 NOTE — Progress Notes (Signed)
Cardiology Office Note   Date:  01/05/2021   ID:  Corey Murphy, DOB 05-20-1950, MRN [id number]  PCP:  [facility] School, MD Cardiologist:  Rozann Lesches, MD 10/27/2019 Electrphysiologist: None Rosaria Ferries, PA-C   No chief complaint on file.   History of Present Illness: Corey Murphy is a 70 y.o. male with a history of HTN, HLD, NICM w/ EF 40% >> 55%, IgG lambda monoclonal gammopathy  Corey Murphy presents for cardiology followup   His systolic BP has been running higher than usual, in the 140s much of the time.   He denies SOB. Exercises 6 days a week, 90" at a time.  He does not feel that he has any new dyspnea on exertion, feels that he is exercise tolerance is stable.  Never gets LE edema, denies orthopnea or PND.  Never gets chest pain.   He has been coughing up clear phlegm at times, mostly first thing am or late at night.  He is concerned that this is cardiac.   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  Diminutive adenomatous polyp in the mid-right colon, otherwise normal   COLONOSCOPY N/A 06/19/2012   Procedure: COLONOSCOPY;  Surgeon: Daneil Dolin, MD;  [facility];  Service: Endoscopy;  Laterality: N/A;  8:30AM-rescheduled 10:30am Darius Bump notified pt   COLONOSCOPY N/A 08/08/2017   Surgeon: Daneil Dolin, MD;  diverticulosis in sigmoid,  descending, and transverse colon, three 4-7 mm polyps in ascending colon, and one 9 mm polyp in ascending colon. Pathology with tubular adenomas. Repeat in 3 years.   COLONOSCOPY N/A 04/23/2019   Sigmoid and descending colon diverticulosis, one 5 mm polyp (tubular adenoma) in descending colon, non-bleeding internal hemorrhoids. 5 year surveillance.    Ileocolonoscopy  06/28/2007   [id number] rectum/Submucosal sigmoid diverticula (chronic), doubt clinical significance Hyperplastic polypoid-appearing fold, mid ascending colon, status/Remainder colonic mucosa and terminal ileum mucosa appeared normal    LEFT AND RIGHT HEART CATHETERIZATION WITH CORONARY ANGIOGRAM  03/19/2013   Procedure: LEFT AND RIGHT HEART CATHETERIZATION WITH CORONARY ANGIOGRAM;  Surgeon: Wellington Hampshire, MD;  Location: [facility] CATH LAB;  Service: Cardiovascular;;   POLYPECTOMY  08/08/2017   Procedure: POLYPECTOMY;  Surgeon: Daneil Dolin, MD;  [facility];  Service: Endoscopy;;  ascending colon polyps x4 (cold snare-3,hot snare)   TOTAL HIP ARTHROPLASTY  2010   Left   TOTAL HIP ARTHROPLASTY  2010   Right    Current Outpatient Medications  Medication Sig Dispense Refill   aspirin 81 MG tablet Take 81 mg by mouth daily.     carvedilol (COREG) 12.5 MG tablet TAKE 1 TABLET BY MOUTH 2 TIMES DAILY WITH A MEAL. 180 tablet 3   furosemide (LASIX) 20 MG tablet TAKE 1 TABLET BY MOUTH EVERY DAY 90 tablet 1  glimepiride (AMARYL) 2 MG tablet Take 2 mg by mouth 2 (two) times a day.      HYDROcodone-acetaminophen (NORCO) 10-325 MG tablet Take 1 tablet by mouth every 6 (six) hours as needed for moderate pain.      lisinopril (PRINIVIL,ZESTRIL) 2.5 MG tablet Take 2.5 mg by mouth in the morning and at bedtime.      Multiple Vitamins-Iron (MULTIVITAMIN/IRON PO) Take 1 tablet by mouth daily.      ONETOUCH ULTRA test strip 1 each 2 (two) times daily.     sildenafil (REVATIO) 20 MG tablet      simvastatin (ZOCOR) 20 MG tablet Take 20  mg by mouth daily.   3   spironolactone (ALDACTONE) 25 MG tablet Take 12.5 mg by mouth daily.     traZODone (DESYREL) 50 MG tablet Take 25 mg by mouth at bedtime.     zolpidem (AMBIEN) 10 MG tablet Take 10 mg by mouth at bedtime.     No current facility-administered medications for this visit.    Allergies:   Other and Peanut-containing drug products    Social History:  The patient  reports that he quit smoking about 12 years ago. His smoking use included cigarettes. He has a 12.00 pack-year smoking history. He has never used smokeless tobacco. He reports current alcohol use. He reports current drug use. Drug: Marijuana.   Family History:  The patient's family history includes Cancer in his sister; Diabetes in his mother; Heart failure in his mother and sister; Hypertension in his mother and sister.  He indicated that his mother is deceased. He indicated that his father is alive. He indicated that only one of his two sisters is alive. He indicated that the status of his neg hx is unknown.    ROS:  Please see the history of present illness. All other systems are reviewed and negative.    PHYSICAL EXAM: VS:  BP 134/80   Pulse 73   Ht '5\' 7"'$  (1.702 m)   Wt 203 lb (92.1 kg)   SpO2 98%   BMI 31.79 kg/m  , BMI Body mass index is 31.79 kg/m. GEN: Well nourished, well developed, male in no acute distress HEENT: normal for age  Neck: Mild JVD, no carotid bruit, no masses Cardiac: RRR; no murmur, no rubs, or gallops Respiratory:  clear to auscultation bilaterally, normal work of breathing GI: soft, nontender, nondistended, + BS MS: no deformity or atrophy; no edema; distal pulses are 2+ in all 4 extremities  Skin: warm and dry, no rash Neuro:  Strength and sensation are intact Psych: euthymic mood, full affect   EKG:  EKG is ordered today. The ekg ordered today demonstrates SR, PR 294 ms (264 ms in 2019), HR 73, no acute changes  ECHO: 09/11/2018  1. The left ventricle has normal  systolic function, with an ejection fraction of 55-60%. The cavity size was normal. There is moderate concentric left ventricular hypertrophy. Left ventricular diastolic Doppler parameters are consistent with impaired relaxation. Indeterminate filling pressures No evidence of left ventricular regional wall motion abnormalities.   2. The right ventricle has normal systolic function. The cavity was normal. There is no increase in right ventricular wall thickness.   3. The mitral valve is grossly normal.   4. The tricuspid valve is grossly normal.   5. The aortic valve is tricuspid.   6. The aortic root is normal in size and structure.   CATH: 03/19/2013 Procedural Findings: Hemodynamics RA 7 mmHg RV forty-eight over  three mmHg PA 50/20 mmHg PCWP 20 mmHg LV 101/10 mmHg . LVEDP: 19 mmHg AO 102/81 mmHg   Oxygen saturations: PA 55% and 60% AO 97%   Cardiac Output (Fick) 3.74  Cardiac Index (Fick) 1. 79         Aortic Valve: Peak to Peak gradient: Not significant  Pulmonary vascular resistance (PVR): 2.1 [place] units.   Coronary angiography: Coronary dominance: Right   Left Main:  Normal Left Anterior Descending (LAD):  Normal. 1st diagonal (D1):  Large in size with no significant disease. 2nd diagonal (D2):  Very small in size. 3rd diagonal (D3):  Very small in size. Circumflex (LCx):  Normal in size and nondominant. The vessel has no significant disease. 1st obtuse marginal:  Large in size with no significant disease. 2nd obtuse marginal:  Medium in size with no significant disease. 3rd obtuse marginal:  Normal in size with no significant disease.      Right Coronary Artery: Large in size and dominant. The vessel has no significant disease.     Left ventriculography: Left ventricular systolic function is severely reduced , LVEF is estimated at 15 %, there is mild mitral regurgitation    Final Conclusions:   1. Mildly elevated filling pressures with mild pulmonary hypertension.  Severely reduced cardiac output.  2. Normal coronary arteries.  3. Severely reduced LV systolic function with an ejection fraction of 15%.    Recommendations:  The patient has nonischemic cardiomyopathy. Recommend aggressive medical therapy. I switched metoprolol to carvedilol. Continue oral diuresis. I added spironolactone. Up titrate heart failure medications and recheck ejection fraction in 3 months.    Kathlyn Sacramento MD, [facility] 03/19/2013, 4:55 PM  Recent Labs: 10/07/2020: ALT 27; BUN 18; Creatinine, Ser 1.48; Hemoglobin 13.9; Platelets 304; Potassium 4.5; Sodium 139  CBC    Component Value Date/Time   WBC 5.6 10/07/2020 1340   RBC 4.44 10/07/2020 1340   HGB 13.9 10/07/2020 1340   HCT 42.7 10/07/2020 1340   PLT 304 10/07/2020 1340   MCV 96.2 10/07/2020 1340   MCH 31.3 10/07/2020 1340   MCHC 32.6 10/07/2020 1340   RDW 14.0 10/07/2020 1340   LYMPHSABS 2.3 10/07/2020 1340   MONOABS 0.5 10/07/2020 1340   EOSABS 0.1 10/07/2020 1340   BASOSABS 0.0 10/07/2020 1340   CMP Latest Ref Rng & Units 10/07/2020 04/07/2020 12/05/2019  Glucose 70 - 99 mg/dL 155(H) 168(H) 203(H)  BUN 8 - 23 mg/dL 18 15 29(H)  Creatinine 0.61 - 1.24 mg/dL 1.48(H) 1.40(H) 1.57(H)  Sodium 135 - 145 mmol/L 139 135 137  Potassium 3.5 - 5.1 mmol/L 4.5 4.3 4.4  Chloride 98 - 111 mmol/L 106 100 102  CO2 22 - 32 mmol/L '28 26 25  '$ Calcium 8.9 - 10.3 mg/dL 9.3 9.3 9.4  Total Protein 6.5 - 8.1 g/dL 7.7 8.3(H) 8.5(H)  Total Bilirubin 0.3 - 1.2 mg/dL 0.5 0.5 0.5  Alkaline Phos 38 - 126 U/L 88 96 86  AST 15 - 41 U/L '18 17 17  '$ ALT 0 - 44 U/L 27 30 34   Lipid Panel Lab Results  Component Value Date   CHOL 144 03/19/2013   HDL 49 03/19/2013   LDLCALC 73 03/19/2013   TRIG 110 03/19/2013   CHOLHDL 2.9 03/19/2013      Wt Readings from Last 3 Encounters:  01/05/21 203 lb (92.1 kg)  10/14/20 203 lb 1.6 oz (92.1 kg)  08/24/20 200 lb 12.8 oz (91.1 kg)     Other studies Reviewed: Additional studies/  records that were  reviewed today include: Office notes, hospital records and testing.  ASSESSMENT AND PLAN:  1.  Chronic combined systolic and diastolic CHF: - His EF was initially 15%, and then normalized by 2018.  He still has grade 1 diastolic dysfunction.  The cause of the cardiomyopathy is unclear - He admits that prior to his EF being down, he was drinking quite a bit essentially because he was bored after he retired.  He has stopped this. -His volume status is good and he has had recent labs.  No further evaluation needed at this time. - He has no lifestyle issues contributing to his cardiomyopathy and he is asymptomatic from a cardiac standpoint. -Unclear if his cough and increased secretions are due to heart failure - However, he is on a very low-dose of Lasix, will increase it to 40 mg daily for 3 days, then go back to 20 mg daily. - he is to continue to track his weight and monitor sodium intake - Contact us if he has any further issues or concerns.   2.  Hypertension -In the setting of CHF, I explained to that it was okay for his systolic blood pressure to be somewhere between 90 and 110 as long as he was asymptomatic - His heart rate is in the 70s, I will increase his Coreg to better control his blood pressure - Although he is only on a small dose of lisinopril, his creatinine is high enough that I do not wish to increase it. - He is to continue to follow his blood pressure and let us know if blood pressure control does not improve.    Current medicines are reviewed at length with the patient today.  The patient does not have concerns regarding medicines.  The following changes have been made: Increase Lasix from 20 mg up to 40 mg daily for 3 days and then resume 20 mg daily.  Increase Coreg from 12.5 mg twice daily to 18.75 mg twice daily.  If his condition does not change with the increase Lasix for 3 days, start taking Claritin or some other OTC antihistamine.  Labs/ tests ordered today  include:  No orders of the defined types were placed in this encounter.    Disposition:   FU with Rozann Lesches, MD  Signed, Rosaria Ferries, PA-C  01/05/2021 3:26 PM    [place] Phone: [phone]; Fax: [phone]

## 2021-01-05 NOTE — Patient Instructions (Signed)
Medication Instructions:  Increase Lasix to 40 mg for 3 day and then resume 20 mg Daily  Increase Coreg to 1 1/2 Two Times Daily   Weight Daily call for weight gain of 2-3 lbs overnight or 5 lbs in one week.   *If you need a refill on your cardiac medications before your next appointment, please call your pharmacy*   Lab Work: NONE   If you have labs (blood work) drawn today and your tests are completely normal, you will receive your results only by: [place] (if you have MyChart) OR A paper copy in the mail If you have any lab test that is abnormal or we need to change your treatment, we will call you to review the results.   Testing/Procedures: NONE    Follow-Up: At [facility], you and your health needs are our priority.  As part of our continuing mission to provide you with exceptional heart care, we have created designated Provider Care Teams.  These Care Teams include your primary Cardiologist (physician) and Advanced Practice Providers (APPs -  Physician Assistants and Nurse Practitioners) who all work together to provide you with the care you need, when you need it.  We recommend signing up for the patient portal called "MyChart".  Sign up information is provided on this After Visit Summary.  MyChart is used to connect with patients for Virtual Visits (Telemedicine).  Patients are able to view lab/test results, encounter notes, upcoming appointments, etc.  Non-urgent messages can be sent to your provider as well.   To learn more about what you can do with MyChart, go to [email or web address].    Your next appointment:   1 year(s)  The format for your next appointment:   In Person  Provider:   Rozann Lesches, MD   Other Instructions If this does not help try Allegra or Claritin   Have Dr. Gerarda Fraction send any recent labs to our office   Thank you for choosing [facility]!

## 2021-01-06 ENCOUNTER — Ambulatory Visit: Payer: Medicare HMO | Admitting: Family Medicine

## 2021-01-06 DIAGNOSIS — E1129 Type 2 diabetes mellitus with other diabetic kidney complication: Secondary | ICD-10-CM | POA: Diagnosis not present

## 2021-01-06 DIAGNOSIS — Z6831 Body mass index (BMI) 31.0-31.9, adult: Secondary | ICD-10-CM | POA: Diagnosis not present

## 2021-01-06 DIAGNOSIS — G4700 Insomnia, unspecified: Secondary | ICD-10-CM | POA: Diagnosis not present

## 2021-01-06 DIAGNOSIS — E6609 Other obesity due to excess calories: Secondary | ICD-10-CM | POA: Diagnosis not present

## 2021-01-06 DIAGNOSIS — Z23 Encounter for immunization: Secondary | ICD-10-CM | POA: Diagnosis not present

## 2021-01-06 DIAGNOSIS — C9000 Multiple myeloma not having achieved remission: Secondary | ICD-10-CM | POA: Diagnosis not present

## 2021-01-06 NOTE — Addendum Note (Signed)
Addended by: Christella Scheuermann C on: 01/06/2021 08:01 AM   Modules accepted: Orders

## 2021-03-01 DIAGNOSIS — I129 Hypertensive chronic kidney disease with stage 1 through stage 4 chronic kidney disease, or unspecified chronic kidney disease: Secondary | ICD-10-CM | POA: Diagnosis not present

## 2021-03-01 DIAGNOSIS — R778 Other specified abnormalities of plasma proteins: Secondary | ICD-10-CM | POA: Diagnosis not present

## 2021-03-01 DIAGNOSIS — N183 Chronic kidney disease, stage 3 unspecified: Secondary | ICD-10-CM | POA: Diagnosis not present

## 2021-03-01 DIAGNOSIS — E1122 Type 2 diabetes mellitus with diabetic chronic kidney disease: Secondary | ICD-10-CM | POA: Diagnosis not present

## 2021-04-05 ENCOUNTER — Other Ambulatory Visit (HOSPITAL_COMMUNITY): Payer: Self-pay | Admitting: *Deleted

## 2021-04-05 DIAGNOSIS — D472 Monoclonal gammopathy: Secondary | ICD-10-CM

## 2021-04-06 ENCOUNTER — Inpatient Hospital Stay (HOSPITAL_COMMUNITY): Payer: Medicare HMO | Attending: Hematology

## 2021-04-06 ENCOUNTER — Other Ambulatory Visit: Payer: Self-pay

## 2021-04-06 DIAGNOSIS — C9000 Multiple myeloma not having achieved remission: Secondary | ICD-10-CM | POA: Insufficient documentation

## 2021-04-06 DIAGNOSIS — E1122 Type 2 diabetes mellitus with diabetic chronic kidney disease: Secondary | ICD-10-CM | POA: Insufficient documentation

## 2021-04-06 DIAGNOSIS — Z87891 Personal history of nicotine dependence: Secondary | ICD-10-CM | POA: Diagnosis not present

## 2021-04-06 DIAGNOSIS — D472 Monoclonal gammopathy: Secondary | ICD-10-CM

## 2021-04-06 DIAGNOSIS — N189 Chronic kidney disease, unspecified: Secondary | ICD-10-CM | POA: Diagnosis not present

## 2021-04-06 DIAGNOSIS — Z809 Family history of malignant neoplasm, unspecified: Secondary | ICD-10-CM | POA: Diagnosis not present

## 2021-04-06 DIAGNOSIS — I129 Hypertensive chronic kidney disease with stage 1 through stage 4 chronic kidney disease, or unspecified chronic kidney disease: Secondary | ICD-10-CM | POA: Insufficient documentation

## 2021-04-06 LAB — COMPREHENSIVE METABOLIC PANEL
ALT: 34 U/L (ref 0–44)
AST: 19 U/L (ref 15–41)
Albumin: 3.7 g/dL (ref 3.5–5.0)
Alkaline Phosphatase: 90 U/L (ref 38–126)
Anion gap: 7 (ref 5–15)
BUN: 20 mg/dL (ref 8–23)
CO2: 26 mmol/L (ref 22–32)
Calcium: 9.1 mg/dL (ref 8.9–10.3)
Chloride: 104 mmol/L (ref 98–111)
Creatinine, Ser: 1.31 mg/dL — ABNORMAL HIGH (ref 0.61–1.24)
GFR, Estimated: 59 mL/min — ABNORMAL LOW (ref >60)
Glucose, Bld: 166 mg/dL — ABNORMAL HIGH (ref 70–99)
Potassium: 4 mmol/L (ref 3.5–5.1)
Sodium: 137 mmol/L (ref 135–145)
Total Bilirubin: 0.6 mg/dL (ref 0.3–1.2)
Total Protein: 8 g/dL (ref 6.5–8.1)

## 2021-04-06 LAB — CBC WITH DIFFERENTIAL/PLATELET
Abs Immature Granulocytes: 0.01 10*3/uL (ref 0.00–0.07)
Basophils Absolute: 0 10*3/uL (ref 0.0–0.1)
Basophils Relative: 1 %
Eosinophils Absolute: 0.1 10*3/uL (ref 0.0–0.5)
Eosinophils Relative: 2 %
HCT: 40.2 % (ref 39.0–52.0)
Hemoglobin: 13.7 g/dL (ref 13.0–17.0)
Immature Granulocytes: 0 %
Lymphocytes Relative: 43 %
Lymphs Abs: 2.1 10*3/uL (ref 0.7–4.0)
MCH: 32 pg (ref 26.0–34.0)
MCHC: 34.1 g/dL (ref 30.0–36.0)
MCV: 93.9 fL (ref 80.0–100.0)
Monocytes Absolute: 0.5 10*3/uL (ref 0.1–1.0)
Monocytes Relative: 11 %
Neutro Abs: 2 10*3/uL (ref 1.7–7.7)
Neutrophils Relative %: 43 %
Platelets: 249 10*3/uL (ref 150–400)
RBC: 4.28 MIL/uL (ref 4.22–5.81)
RDW: 13.8 % (ref 11.5–15.5)
WBC: 4.7 10*3/uL (ref 4.0–10.5)
nRBC: 0 % (ref 0.0–0.2)

## 2021-04-07 LAB — KAPPA/LAMBDA LIGHT CHAINS
Kappa free light chain: 28.1 mg/L — ABNORMAL HIGH (ref 3.3–19.4)
Kappa, lambda light chain ratio: 0.56 (ref 0.26–1.65)
Lambda free light chains: 50.6 mg/L — ABNORMAL HIGH (ref 5.7–26.3)

## 2021-04-08 LAB — PROTEIN ELECTROPHORESIS, SERUM
A/G Ratio: 0.9 (ref 0.7–1.7)
Albumin ELP: 3.6 g/dL (ref 2.9–4.4)
Alpha-1-Globulin: 0.2 g/dL (ref 0.0–0.4)
Alpha-2-Globulin: 0.7 g/dL (ref 0.4–1.0)
Beta Globulin: 1.3 g/dL (ref 0.7–1.3)
Gamma Globulin: 1.8 g/dL (ref 0.4–1.8)
Globulin, Total: 4 g/dL — ABNORMAL HIGH (ref 2.2–3.9)
M-Spike, %: 1.4 g/dL — ABNORMAL HIGH
Total Protein ELP: 7.6 g/dL (ref 6.0–8.5)

## 2021-04-11 DIAGNOSIS — H35373 Puckering of macula, bilateral: Secondary | ICD-10-CM | POA: Diagnosis not present

## 2021-04-11 DIAGNOSIS — H52213 Irregular astigmatism, bilateral: Secondary | ICD-10-CM | POA: Diagnosis not present

## 2021-04-11 DIAGNOSIS — H2513 Age-related nuclear cataract, bilateral: Secondary | ICD-10-CM | POA: Diagnosis not present

## 2021-04-11 DIAGNOSIS — H524 Presbyopia: Secondary | ICD-10-CM | POA: Diagnosis not present

## 2021-04-11 DIAGNOSIS — H2512 Age-related nuclear cataract, left eye: Secondary | ICD-10-CM | POA: Diagnosis not present

## 2021-04-11 DIAGNOSIS — H25013 Cortical age-related cataract, bilateral: Secondary | ICD-10-CM | POA: Diagnosis not present

## 2021-04-11 DIAGNOSIS — E119 Type 2 diabetes mellitus without complications: Secondary | ICD-10-CM | POA: Diagnosis not present

## 2021-04-12 NOTE — Progress Notes (Signed)
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    CANCER STAGING:  Cancer Staging  No matching staging information was found for the patient.  INTERVAL HISTORY:  [name], a 70 y.o. male, returns for routine follow-up of his smoldering IgG lambda multiple myeloma. Corey Murphy was last seen on 10/14/2020.   Today he reports feeling good. He denies infections, fevers, night sweats, weight loss, new pains, headaches, vision changes, and tingling/numbness.   REVIEW OF SYSTEMS:  Review of Systems  Constitutional:  Negative for appetite change, fatigue (75%), fever and unexpected weight change.  Eyes:  Negative for eye problems.  Musculoskeletal:  Negative for arthralgias and myalgias.  Neurological:  Negative for headaches and numbness.  Psychiatric/Behavioral:  Positive for sleep disturbance.   All other systems reviewed and are negative.  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  Diminutive adenomatous polyp in the mid-right colon, otherwise normal   COLONOSCOPY N/A 06/19/2012   Procedure: COLONOSCOPY;  Surgeon:  Daneil Dolin, MD;  [facility];  Service: Endoscopy;  Laterality: N/A;  8:30AM-rescheduled 10:30am Darius Bump notified pt   COLONOSCOPY N/A 08/08/2017   Surgeon: Daneil Dolin, MD;  diverticulosis in sigmoid, descending, and transverse colon, three 4-7 mm polyps in ascending colon, and one 9 mm polyp in ascending colon. Pathology with tubular adenomas. Repeat in 3 years.   COLONOSCOPY N/A 04/23/2019   Sigmoid and descending colon diverticulosis, one 5 mm polyp (tubular adenoma) in descending colon, non-bleeding internal hemorrhoids. 5 year surveillance.    Ileocolonoscopy  06/28/2007   [id number] rectum/Submucosal sigmoid diverticula (chronic), doubt clinical significance Hyperplastic polypoid-appearing fold, mid ascending colon, status/Remainder colonic mucosa and terminal ileum mucosa appeared normal    LEFT AND RIGHT HEART CATHETERIZATION WITH CORONARY ANGIOGRAM  03/19/2013   Procedure: LEFT AND RIGHT HEART CATHETERIZATION WITH CORONARY ANGIOGRAM;  Surgeon: Wellington Hampshire, MD;  Location: [facility];  Service: Cardiovascular;;   POLYPECTOMY  08/08/2017   Procedure: POLYPECTOMY;  Surgeon: Daneil Dolin, MD;  [facility];  Service: Endoscopy;;  ascending colon polyps x4 (cold snare-3,hot snare)   TOTAL HIP ARTHROPLASTY  2010   Left   TOTAL HIP ARTHROPLASTY  2010   Right    SOCIAL HISTORY:  Social History   Socioeconomic History   Marital status: Married    Spouse name: Not on file   Number of children: 2   Years of education: Not on file   Highest education level: Not on file  Occupational History   Occupation: Retired, Psychologist, educational  Tobacco Use   Smoking status: Former    Packs/day: 1.00    Years: 12.00    Pack years: 12.00    Types: Cigarettes    Quit date: 05/28/2008    Years since quitting: 12.8   Smokeless tobacco: Never  Vaping Use   Vaping Use: Never used  Substance and Sexual Activity   Alcohol use: Yes    Comment: Occasionally 2 to 3  times a month   Drug use: Yes    Types: Marijuana    Comment: Smoked via a bowl occassionally.    Sexual activity: Not on file  Other Topics Concern   Not on file  Social History Narrative   Not on file   Social Determinants of Health   Financial Resource Strain: Not on file  Food Insecurity: No Food Insecurity   Worried About Running Out of Food in the Last Year: Never true   Ran Out of Food in the Last Year: Never true  Transportation Needs: No Transportation Needs   Lack of Transportation (Medical): No   Lack of Transportation (Non-Medical): No  Physical Activity: Inactive   Days of Exercise per Week: 0 days   Minutes of Exercise per Session: 0 min  Stress: No Stress Concern Present   Feeling of Stress : Not at all  Social Connections: Moderately Integrated   Frequency of Communication with Friends and Family: More than three times a week   Frequency of Social Gatherings with Friends and Family: Three times a week   Attends Religious Services: 1 to 4 times per year   Active Member of Clubs or Organizations: No   Attends Music therapist: Never   Marital Status: Married  Human resources officer Violence: Not At Risk   Fear of Current or Ex-Partner: No   Emotionally Abused: No   Physically Abused: No   Sexually Abused: No    FAMILY HISTORY:  Family History  Problem Relation Age of Onset   Heart failure Mother    Diabetes Mother    Hypertension Mother    Heart failure Sister    Hypertension Sister    Cancer Sister    Colon cancer Neg Hx     CURRENT MEDICATIONS:  Current Outpatient Medications  Medication Sig Dispense Refill   aspirin 81 MG tablet Take 81 mg by mouth daily.     carvedilol (COREG) 12.5 MG tablet Take 1.5 tablets (18.75 mg total) by mouth 2 (two) times daily. 270 tablet 3   furosemide (LASIX) 20 MG tablet TAKE 1 TABLET BY MOUTH EVERY DAY 90 tablet 1   glimepiride (AMARYL) 2 MG tablet Take 2 mg by mouth 2 (two) times a day.       HYDROcodone-acetaminophen (NORCO) 10-325 MG tablet Take 1 tablet by mouth every 6 (six) hours as needed for moderate pain.  (Patient not taking: Reported on 04/13/2021)     lisinopril (PRINIVIL,ZESTRIL) 2.5 MG tablet Take 2.5 mg by mouth in the morning and at bedtime.      Multiple Vitamins-Iron (MULTIVITAMIN/IRON PO) Take 1 tablet by mouth daily.      ONETOUCH ULTRA test strip 1 each 2 (two) times daily.     sildenafil (REVATIO) 20 MG tablet      simvastatin (ZOCOR) 20 MG tablet Take 20 mg by mouth daily.   3   spironolactone (ALDACTONE) 25 MG tablet Take 12.5 mg by mouth daily.     traZODone (DESYREL) 50 MG tablet Take 25 mg by mouth at bedtime.  No current facility-administered medications for this visit.    ALLERGIES:  Allergies  Allergen Reactions   Other    Peanut-Containing Drug Products Rash    *Cashews    PHYSICAL EXAM:  Performance status (ECOG): 1 - Symptomatic but completely ambulatory  Vitals:   04/13/21 1524  BP: (!) 143/84  Pulse: 72  Resp: 18  Temp: 98.3 F (36.8 C)  SpO2: 97%   Wt Readings from Last 3 Encounters:  04/13/21 211 lb 3.2 oz (95.8 kg)  01/05/21 203 lb (92.1 kg)  10/14/20 203 lb 1.6 oz (92.1 kg)   Physical Exam Vitals reviewed.  Constitutional:      Appearance: Normal appearance. He is obese.  Cardiovascular:     Rate and Rhythm: Normal rate and regular rhythm.     Pulses: Normal pulses.     Heart sounds: Normal heart sounds.  Pulmonary:     Effort: Pulmonary effort is normal.     Breath sounds: Normal breath sounds.  Neurological:     General: No focal deficit present.     Mental Status: He is alert and oriented to person, place, and time.  Psychiatric:        Mood and Affect: Mood normal.        Behavior: Behavior normal.     LABORATORY DATA:  I have reviewed the labs as listed.  CBC Latest Ref Rng & Units 04/06/2021 10/07/2020 04/07/2020  WBC 4.0 - 10.5 K/uL 4.7 5.6 4.9  Hemoglobin 13.0 - 17.0 g/dL 13.7 13.9 13.6  Hematocrit  39.0 - 52.0 % 40.2 42.7 41.9  Platelets 150 - 400 K/uL 249 304 248   CMP Latest Ref Rng & Units 04/06/2021 10/07/2020 04/07/2020  Glucose 70 - 99 mg/dL 166(H) 155(H) 168(H)  BUN 8 - 23 mg/dL 20 18 15   Creatinine 0.61 - 1.24 mg/dL 1.31(H) 1.48(H) 1.40(H)  Sodium 135 - 145 mmol/L 137 139 135  Potassium 3.5 - 5.1 mmol/L 4.0 4.5 4.3  Chloride 98 - 111 mmol/L 104 106 100  CO2 22 - 32 mmol/L 26 28 26   Calcium 8.9 - 10.3 mg/dL 9.1 9.3 9.3  Total Protein 6.5 - 8.1 g/dL 8.0 7.7 8.3(H)  Total Bilirubin 0.3 - 1.2 mg/dL 0.6 0.5 0.5  Alkaline Phos 38 - 126 U/L 90 88 96  AST 15 - 41 U/L 19 18 17   ALT 0 - 44 U/L 34 27 30    DIAGNOSTIC IMAGING:  I have independently reviewed the scans and discussed with the patient. No results found.   ASSESSMENT:  1.  IgG lambda smoldering multiple myeloma: -BM BX on 07/08/2015 shows normocellular marrow with monoclonal plasmacytosis, 10-20%, 46, XY, myeloma FISH panel negative -Skeletal survey on 06/04/2019 was negative for lytic lesions.   2.  CKD: -Baseline creatinine between 1.4-1.6.   PLAN:  1.  IgG lambda smoldering multiple myeloma: - He does not report any new onset bone pains.  No recurrent infections. - Last skeletal survey from 06/01/2020 was negative for lytic lesions. - Reviewed labs from 04/06/2021 which showed normal calcium of 9.1.  M spike is stable at 1.4 g.  Hemoglobin is normal. - Free light chain ratio is also in the normal range of 0.56.  Lambda light chains are 50.6. - No "crab" features at this time.  RTC 6 months with repeat labs and skeletal survey.   2.  CKD: - Creatinine is stable at 1.31.  Baseline creatinine between 1.3-1.5.   Orders placed this encounter:  No orders of the  defined types were placed in this encounter.    Derek Jack, MD [place] [phone]   I, Thana Ates, am acting as a scribe for Dr. Derek Jack.  I, Derek Jack MD, have reviewed the above documentation for  accuracy and completeness, and I agree with the above.

## 2021-04-13 ENCOUNTER — Inpatient Hospital Stay (HOSPITAL_COMMUNITY): Payer: Medicare HMO | Admitting: Hematology

## 2021-04-13 ENCOUNTER — Other Ambulatory Visit: Payer: Self-pay

## 2021-04-13 VITALS — BP 143/84 | HR 72 | Temp 98.3°F | Resp 18 | Wt 211.2 lb

## 2021-04-13 DIAGNOSIS — I129 Hypertensive chronic kidney disease with stage 1 through stage 4 chronic kidney disease, or unspecified chronic kidney disease: Secondary | ICD-10-CM | POA: Diagnosis not present

## 2021-04-13 DIAGNOSIS — E1122 Type 2 diabetes mellitus with diabetic chronic kidney disease: Secondary | ICD-10-CM | POA: Diagnosis not present

## 2021-04-13 DIAGNOSIS — D472 Monoclonal gammopathy: Secondary | ICD-10-CM | POA: Diagnosis not present

## 2021-04-13 DIAGNOSIS — Z809 Family history of malignant neoplasm, unspecified: Secondary | ICD-10-CM | POA: Diagnosis not present

## 2021-04-13 DIAGNOSIS — C9000 Multiple myeloma not having achieved remission: Secondary | ICD-10-CM | POA: Diagnosis not present

## 2021-04-13 DIAGNOSIS — N189 Chronic kidney disease, unspecified: Secondary | ICD-10-CM | POA: Diagnosis not present

## 2021-04-13 DIAGNOSIS — Z87891 Personal history of nicotine dependence: Secondary | ICD-10-CM | POA: Diagnosis not present

## 2021-04-13 NOTE — Patient Instructions (Signed)
[place] [facility] Discharge Instructions  You were seen and examined today by Dr. Delton Coombes. If you have any new onset bone pain, changes in energy levels, shortness of breath or headaches please call our office. He reviewed your most recent labs and everything looks good. Your kidney function and myeloma numbers are stable. Please follow up as scheduled in 6 months.   Thank you for choosing [place] at [facility] to provide your oncology and hematology care.  To afford each patient quality time with our provider, please arrive at least 15 minutes before your scheduled appointment time.   If you have a lab appointment with the [facility] please come in thru the Main Entrance and check in at the main information desk.  You need to re-schedule your appointment should you arrive 10 or more minutes late.  We strive to give you quality time with our providers, and arriving late affects you and other patients whose appointments are after yours.  Also, if you no show three or more times for appointments you may be dismissed from the clinic at the providers discretion.     Again, thank you for choosing [facility].  Our hope is that these requests will decrease the amount of time that you wait before being seen by our physicians.       _____________________________________________________________  Should you have questions after your visit to [facility], please contact our office at [phone] and follow the prompts.  Our office hours are 8:00 a.m. and 4:30 p.m. Monday - Friday.  Please note that voicemails left after 4:00 p.m. may not be returned until the following business day.  We are closed weekends and major holidays.  You do have access to a nurse 24-7, just call the main number to the clinic [phone] and do not press any options, hold on the line and a nurse will answer the phone.    For prescription  refill requests, have your pharmacy contact our office and allow 72 hours.    Due to Covid, you will need to wear a mask upon entering the hospital. If you do not have a mask, a mask will be given to you at the Main Entrance upon arrival. For doctor visits, patients may have 1 support person age 24 or older with them. For treatment visits, patients can not have anyone with them due to social distancing guidelines and our immunocompromised population.

## 2021-04-14 ENCOUNTER — Telehealth: Payer: Self-pay | Admitting: Cardiology

## 2021-04-14 NOTE — Telephone Encounter (Signed)
Pt c/o BP issue: STAT if pt c/o blurred vision, one-sided weakness or slurred speech  1. What are your last 5 BP readings?  165/79 164/81 177/88 177/89 164/85  2. Are you having any other symptoms (ex. Dizziness, headache, blurred vision, passed out)? NO TO ALL  3. What is your BP issue? PT'S BP HAS BEEN INCREASING FOR THE PAST 2 WEEKS. PT WOULD ALSO LIKE TO LET DR. MCDOWELL KNOW ABOUT HIS BLOOD SUGAR IS HIGH AS WELL.  PT HAS AN UPCOMING APPT WITH MCDOWELL ON 05/11/21

## 2021-04-14 NOTE — Telephone Encounter (Signed)
Returned call to pt no answer left msg to call back.

## 2021-04-15 MED ORDER — LISINOPRIL 5 MG PO TABS: 5.0000 mg | ORAL_TABLET | Freq: Every day | ORAL | 11 refills | Status: DC

## 2021-04-15 NOTE — Telephone Encounter (Signed)
Pt is taking spironolactone 12.5 mg daily, Lisinopril 2.5 mg BID, and Coreg 18.75 mg BID. Pt does have an appt with Ermalinda Barrios, PA-C on 05/30/21. Please advise.

## 2021-04-15 NOTE — Telephone Encounter (Signed)
Called pt and informed that Lisinopril has been increased to 5 mg two times daily per Dr. Domenic Polite.

## 2021-04-18 ENCOUNTER — Other Ambulatory Visit: Payer: Self-pay

## 2021-04-18 ENCOUNTER — Encounter (INDEPENDENT_AMBULATORY_CARE_PROVIDER_SITE_OTHER): Payer: Medicare HMO | Admitting: Ophthalmology

## 2021-04-18 DIAGNOSIS — H35033 Hypertensive retinopathy, bilateral: Secondary | ICD-10-CM | POA: Diagnosis not present

## 2021-04-18 DIAGNOSIS — E113293 Type 2 diabetes mellitus with mild nonproliferative diabetic retinopathy without macular edema, bilateral: Secondary | ICD-10-CM | POA: Diagnosis not present

## 2021-04-18 DIAGNOSIS — I10 Essential (primary) hypertension: Secondary | ICD-10-CM

## 2021-04-18 DIAGNOSIS — H35373 Puckering of macula, bilateral: Secondary | ICD-10-CM | POA: Diagnosis not present

## 2021-04-18 DIAGNOSIS — H43813 Vitreous degeneration, bilateral: Secondary | ICD-10-CM | POA: Diagnosis not present

## 2021-04-23 ENCOUNTER — Ambulatory Visit
Admission: EM | Admit: 2021-04-23 | Discharge: 2021-04-23 | Disposition: A | Discharge status: Home / Self Care | Payer: Medicare HMO

## 2021-04-23 ENCOUNTER — Other Ambulatory Visit: Payer: Self-pay

## 2021-04-23 ENCOUNTER — Encounter: Payer: Self-pay | Admitting: Emergency Medicine

## 2021-04-23 DIAGNOSIS — I10 Essential (primary) hypertension: Secondary | ICD-10-CM | POA: Diagnosis not present

## 2021-04-23 NOTE — ED Provider Notes (Signed)
[facility]    CSN: [id number] Arrival date & time: 04/23/21  1141      History   Chief Complaint No chief complaint on file.   HPI Corey Murphy is a 70 y.o. male.   Pt presents for evaluation of blood pressure.  He reports his lisinopril was increased about one week ago.  He checked his blood pressure this morning and it was around 200/90.  He became concerned and felt he needed to be seen.  He is asymptomatic at this time.  Denies chest pain, shortness of breath, palpitations, syncope, LE edema, fatigue, headache.  His wife reports he has not been taking the BP medication at the same time every day.  He has a follow up scheduled with cardio next month.  BP here 166/92.   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   Nonischemic cardiomyopathy ([place])    LVEF approximately 40% 06/2013 - improved to normal range   Obesity    Type 2 diabetes mellitus [facility])     Patient Active Problem List   Diagnosis Date Noted   Rectal bleeding 02/17/2019   Smoldering myeloma 08/03/2015   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  Diminutive adenomatous polyp in the mid-right colon, otherwise normal   COLONOSCOPY N/A 06/19/2012   Procedure: COLONOSCOPY;  Surgeon:  Daneil Dolin, MD;  [facility];  Service: Endoscopy;  Laterality: N/A;  8:30AM-rescheduled 10:30am Darius Bump notified pt   COLONOSCOPY N/A 08/08/2017   Surgeon: Daneil Dolin, MD;  diverticulosis in sigmoid, descending, and transverse colon, three 4-7 mm polyps in ascending colon, and one 9 mm polyp in ascending colon. Pathology with tubular adenomas. Repeat in 3 years.   COLONOSCOPY N/A 04/23/2019   Sigmoid and descending colon diverticulosis, one 5 mm polyp (tubular adenoma) in descending colon, non-bleeding internal hemorrhoids. 5 year surveillance.    Ileocolonoscopy  06/28/2007   [id number] rectum/Submucosal sigmoid diverticula (chronic), doubt clinical significance Hyperplastic polypoid-appearing fold, mid ascending colon, status/Remainder colonic mucosa and terminal ileum mucosa appeared normal    LEFT AND RIGHT HEART CATHETERIZATION WITH CORONARY ANGIOGRAM  03/19/2013   Procedure: LEFT AND RIGHT HEART CATHETERIZATION WITH CORONARY ANGIOGRAM;  Surgeon: Wellington Hampshire, MD;  Location: [facility];  Service: Cardiovascular;;   POLYPECTOMY  08/08/2017   Procedure: POLYPECTOMY;  Surgeon: Daneil Dolin, MD;  [facility];  Service: Endoscopy;;  ascending colon polyps x4 (cold snare-3,hot snare)   TOTAL HIP ARTHROPLASTY  2010   Left   TOTAL HIP ARTHROPLASTY  2010   Right       Home Medications    Prior to Admission medications   Medication Sig Start Date End Date Taking? Authorizing Provider  aspirin 81 MG tablet Take 81 mg by mouth daily.    [provider]  carvedilol (COREG) 12.5 MG tablet Take 1.5 tablets (18.75 mg total) by mouth 2 (two) times daily. 01/05/21 04/05/21  Barrett, Evelene Croon, PA-C  furosemide (LASIX) 20 MG tablet TAKE 1 TABLET BY MOUTH EVERY DAY 12/27/20   Satira Sark, MD  glimepiride (AMARYL) 2 MG tablet Take 2 mg by mouth 2 (two) times a day.     [provider]  HYDROcodone-acetaminophen (NORCO) 10-325 MG tablet Take 1  tablet by mouth every 6 (six) hours as needed for moderate pain.  Patient not taking: Reported on 04/13/2021    [provider]  lisinopril (ZESTRIL) 5 MG tablet Take 1 tablet (5 mg total) by mouth daily. 04/15/21 07/14/21  Satira Sark, MD  Multiple Vitamins-Iron (MULTIVITAMIN/IRON PO) Take 1 tablet by mouth daily.     [provider]  [facility] ULTRA test strip 1 each 2 (two) times daily. 03/24/20   [provider]  sildenafil (REVATIO) 20 MG tablet  07/30/20   [provider]  simvastatin (ZOCOR) 20 MG tablet Take 20 mg by mouth daily.  10/14/15   [provider]  spironolactone (ALDACTONE) 25 MG tablet Take 12.5 mg by mouth daily.    [provider]  traZODone (DESYREL) 50 MG tablet Take 25 mg by mouth at bedtime. 01/29/20   [provider]    Family History Family History  Problem Relation Age of Onset   Heart failure Mother    Diabetes Mother    Hypertension Mother    Heart failure Sister    Hypertension Sister    Cancer Sister    Colon cancer Neg Hx     Social History Social History   Tobacco Use   Smoking status: Former    Packs/day: 1.00    Years: 12.00    Pack years: 12.00    Types: Cigarettes    Quit date: 05/28/2008    Years since quitting: 12.9   Smokeless tobacco: Never  Vaping Use   Vaping Use: Never used  Substance Use Topics   Alcohol use: Yes    Comment: Occasionally 2 to 3 times a month   Drug use: Yes    Types: Marijuana    Comment: Smoked via a bowl occassionally.      Allergies   Other and Peanut-containing drug products   Review of Systems Review of Systems  Constitutional:  Negative for chills and fever.  HENT:  Negative for ear pain and sore throat.   Eyes:  Negative for pain and visual disturbance.  Respiratory:  Negative for cough and shortness of breath.   Cardiovascular:  Negative for chest pain and palpitations.  Gastrointestinal:  Negative for abdominal pain and  vomiting.  Genitourinary:  Negative for dysuria and hematuria.  Musculoskeletal:  Negative for arthralgias and back pain.  Skin:  Negative for color change and rash.  Neurological:  Negative for seizures and syncope.  All other systems reviewed and are negative.   Physical Exam Triage Vital Signs ED Triage Vitals  Enc Vitals Group     BP 04/23/21 1247 (!) 166/92     Pulse Rate 04/23/21 1247 70     Resp 04/23/21 1247 18     Temp 04/23/21 1247 98.7 F (37.1 C)     Temp Source 04/23/21 1247 Oral     SpO2 04/23/21 1247 98 %     Weight --  Height --      Head Circumference --      Peak Flow --      Pain Score 04/23/21 1249 0     Pain Loc --      Pain Edu? --      Excl. in [place]? --    No data found.  Updated Vital Signs BP (!) 166/92 (BP Location: Right Arm)    Pulse 70    Temp 98.7 F (37.1 C) (Oral)    Resp 18    SpO2 98%   Visual Acuity Right Eye Distance:   Left Eye Distance:   Bilateral Distance:    Right Eye Near:   Left Eye Near:    Bilateral Near:     Physical Exam Vitals and nursing note reviewed.  Constitutional:      General: He is not in acute distress.    Appearance: He is well-developed.  HENT:     Head: Normocephalic and atraumatic.  Eyes:     Conjunctiva/sclera: Conjunctivae normal.  Cardiovascular:     Rate and Rhythm: Normal rate and regular rhythm.     Heart sounds: No murmur heard. Pulmonary:     Effort: Pulmonary effort is normal. No respiratory distress.     Breath sounds: Normal breath sounds.  Abdominal:     Palpations: Abdomen is soft.     Tenderness: There is no abdominal tenderness.  Musculoskeletal:        General: No swelling.     Cervical back: Neck supple.  Skin:    General: Skin is warm and dry.     Capillary Refill: Capillary refill takes less than 2 seconds.  Neurological:     Mental Status: He is alert.  Psychiatric:        Mood and Affect: Mood normal.     UC Treatments / Results  Labs (all labs ordered are  listed, but only abnormal results are displayed) Labs Reviewed - No data to display  EKG   Radiology No results found.  Procedures Procedures (including critical care time)  Medications Ordered in UC Medications - No data to display  Initial Impression / Assessment and Plan / UC Course  I have reviewed the triage vital signs and the nursing notes.  Pertinent labs & imaging results that were available during my care of the patient were reviewed by me and considered in my medical decision making (see chart for details).     HTN.  Advised consistently taking bp medication, avoid salt, drink plenty of fluids. Advised him to log BP reading over the next few days and reach out to cardiology for further adjustments if needed.  He is asymptomatic today.  Stable for discharge.  Final Clinical Impressions(s) / UC Diagnoses   Final diagnoses:  Essential hypertension     Discharge Instructions      Avoid excessive salt, drink plenty of fluids Keep a log of blood pressure readings Reach out to cardiology if consistently high Return for evaluation if you develop symptoms   ED Prescriptions   None    PDMP not reviewed this encounter.   Ward, Lenise Arena, PA-C 04/23/21 1319

## 2021-04-23 NOTE — Discharge Instructions (Signed)
Avoid excessive salt, drink plenty of fluids Keep a log of blood pressure readings Reach out to cardiology if consistently high Return for evaluation if you develop symptoms

## 2021-04-23 NOTE — ED Triage Notes (Signed)
States BP has been running high this morning.  States BP medication (lisinopril) was increased to 5 mg BID last week.  States blood sugar was 164 this morning.

## 2021-04-28 ENCOUNTER — Ambulatory Visit: Payer: Medicare HMO | Admitting: Cardiovascular Disease

## 2021-04-29 DIAGNOSIS — E1122 Type 2 diabetes mellitus with diabetic chronic kidney disease: Secondary | ICD-10-CM | POA: Diagnosis not present

## 2021-04-29 DIAGNOSIS — I130 Hypertensive heart and chronic kidney disease with heart failure and stage 1 through stage 4 chronic kidney disease, or unspecified chronic kidney disease: Secondary | ICD-10-CM | POA: Diagnosis not present

## 2021-04-29 DIAGNOSIS — I2510 Atherosclerotic heart disease of native coronary artery without angina pectoris: Secondary | ICD-10-CM | POA: Diagnosis not present

## 2021-04-29 DIAGNOSIS — N183 Chronic kidney disease, stage 3 unspecified: Secondary | ICD-10-CM | POA: Diagnosis not present

## 2021-05-04 ENCOUNTER — Telehealth: Payer: Self-pay | Admitting: Cardiology

## 2021-05-04 MED ORDER — SPIRONOLACTONE 25 MG PO TABS: 12.5000 mg | ORAL_TABLET | Freq: Every day | ORAL | 1 refills | Status: DC

## 2021-05-04 NOTE — Telephone Encounter (Signed)
Refilled thru apt later this month

## 2021-05-04 NOTE — Telephone Encounter (Signed)
°*  STAT* If patient is at the pharmacy, call can be transferred to refill team.   1. Which medications need to be refilled? (please list name of each medication and dose if known) spironolactone (ALDACTONE) 25 MG tablet    2. Which pharmacy/location (including street and city if local pharmacy) is medication to be sent to?    [id number] - [place], [place] - [address]  3. Do they need a 30 day or 90 day supply? 90 ds

## 2021-05-06 DIAGNOSIS — E782 Mixed hyperlipidemia: Secondary | ICD-10-CM | POA: Diagnosis not present

## 2021-05-06 DIAGNOSIS — I10 Essential (primary) hypertension: Secondary | ICD-10-CM | POA: Diagnosis not present

## 2021-05-06 DIAGNOSIS — Z0001 Encounter for general adult medical examination with abnormal findings: Secondary | ICD-10-CM | POA: Diagnosis not present

## 2021-05-06 DIAGNOSIS — I5032 Chronic diastolic (congestive) heart failure: Secondary | ICD-10-CM | POA: Diagnosis not present

## 2021-05-06 DIAGNOSIS — E538 Deficiency of other specified B group vitamins: Secondary | ICD-10-CM | POA: Diagnosis not present

## 2021-05-06 DIAGNOSIS — E1140 Type 2 diabetes mellitus with diabetic neuropathy, unspecified: Secondary | ICD-10-CM | POA: Diagnosis not present

## 2021-05-06 DIAGNOSIS — E669 Obesity, unspecified: Secondary | ICD-10-CM | POA: Diagnosis not present

## 2021-05-06 DIAGNOSIS — Z6832 Body mass index (BMI) 32.0-32.9, adult: Secondary | ICD-10-CM | POA: Diagnosis not present

## 2021-05-06 DIAGNOSIS — E559 Vitamin D deficiency, unspecified: Secondary | ICD-10-CM | POA: Diagnosis not present

## 2021-05-06 DIAGNOSIS — G894 Chronic pain syndrome: Secondary | ICD-10-CM | POA: Diagnosis not present

## 2021-05-06 DIAGNOSIS — E1129 Type 2 diabetes mellitus with other diabetic kidney complication: Secondary | ICD-10-CM | POA: Diagnosis not present

## 2021-05-06 DIAGNOSIS — Z1331 Encounter for screening for depression: Secondary | ICD-10-CM | POA: Diagnosis not present

## 2021-05-10 DIAGNOSIS — H25812 Combined forms of age-related cataract, left eye: Secondary | ICD-10-CM | POA: Diagnosis not present

## 2021-05-10 DIAGNOSIS — H21562 Pupillary abnormality, left eye: Secondary | ICD-10-CM | POA: Diagnosis not present

## 2021-05-10 DIAGNOSIS — H5703 Miosis: Secondary | ICD-10-CM | POA: Diagnosis not present

## 2021-05-10 DIAGNOSIS — H2512 Age-related nuclear cataract, left eye: Secondary | ICD-10-CM | POA: Diagnosis not present

## 2021-05-11 ENCOUNTER — Ambulatory Visit: Payer: Medicare HMO | Admitting: Cardiology

## 2021-05-16 NOTE — Progress Notes (Signed)
Cardiology Office Note    Date:  05/30/2021   ID:  Corey Murphy, DOB April 28, 1951, MRN [id number]   PCP:  Redmond School, Ethel  Cardiologist:  Rozann Lesches, MD   Advanced Practice Provider:  No care team member to display Electrophysiologist:  None   [phone]   No chief complaint on file.   History of Present Illness:  Corey Murphy is a 71 y.o. male with a history of HTN, HLD, NICM w/ EF 40% >> 55%, IgG lambda monoclonal gammopathy.  Patient saw Ms. Barrett 12/2019 and BP running high and had extra fluid onboard. Given extra lasix for 3 days and coreg increased. BP running high in Dec and Dr. Domenic Polite increased lisinopril 5 mg bid. He went to ED 04/23/21 and BP high-no changes made.  Patient domes in for f/u. BP continues to run high. Getting extra salt in diet-bacon, sausage, ham. No swelling.     Past Medical History:  Diagnosis Date   Alcohol dependency ([place])    Chronic kidney disease    Colonic adenoma 2006   Essential hypertension    History of cardiac catheterization    03/2013 LM nl, LAD nl, D1/2/3 small - nl, LCX nondom - nl, OM1/2/3 nl, RCA dom   Hypercholesteremia    IgG lambda monoclonal gammopathy 06/24/2015   Nonischemic cardiomyopathy ([place])    LVEF approximately 40% 06/2013 - improved to normal range   Obesity    Type 2 diabetes mellitus ([place])     Past Surgical History:  Procedure Laterality Date   CIRCUMCISION     COLONOSCOPY  05/20/2004   RMR: Internal hemorrhoids.  Diminutive adenomatous polyp in the mid-right colon, otherwise normal   COLONOSCOPY N/A 06/19/2012   Procedure: COLONOSCOPY;  Surgeon: Daneil Dolin, MD;  [facility];  Service: Endoscopy;  Laterality: N/A;  8:30AM-rescheduled 10:30am Darius Bump notified pt   COLONOSCOPY N/A 08/08/2017   Surgeon: Daneil Dolin, MD;  diverticulosis in sigmoid, descending, and transverse colon, three 4-7 mm polyps in ascending colon, and one 9 mm polyp in  ascending colon. Pathology with tubular adenomas. Repeat in 3 years.   COLONOSCOPY N/A 04/23/2019   Sigmoid and descending colon diverticulosis, one 5 mm polyp (tubular adenoma) in descending colon, non-bleeding internal hemorrhoids. 5 year surveillance.    Ileocolonoscopy  06/28/2007   XAJ:OINOMV rectum/Submucosal sigmoid diverticula (chronic), doubt clinical significance Hyperplastic polypoid-appearing fold, mid ascending colon, status/Remainder colonic mucosa and terminal ileum mucosa appeared normal    LEFT AND RIGHT HEART CATHETERIZATION WITH CORONARY ANGIOGRAM  03/19/2013   Procedure: LEFT AND RIGHT HEART CATHETERIZATION WITH CORONARY ANGIOGRAM;  Surgeon: Wellington Hampshire, MD;  Location: [facility];  Service: Cardiovascular;;   POLYPECTOMY  08/08/2017   Procedure: POLYPECTOMY;  Surgeon: Daneil Dolin, MD;  Location: [facility];  Service: Endoscopy;;  ascending colon polyps x4 (cold snare-3,hot snare)   TOTAL HIP ARTHROPLASTY  2010   Left   TOTAL HIP ARTHROPLASTY  2010   Right    Current Medications: Current Meds  Medication Sig   aspirin 81 MG tablet Take 81 mg by mouth daily.   carvedilol (COREG) 25 MG tablet Take 1 tablet (25 mg total) by mouth 2 (two) times daily.   furosemide (LASIX) 20 MG tablet TAKE 1 TABLET BY MOUTH EVERY DAY   glimepiride (AMARYL) 4 MG tablet Take 4 mg by mouth 2 (two) times daily.   HYDROcodone-acetaminophen (NORCO) 10-325 MG tablet Take 1 tablet  by mouth every 6 (six) hours as needed for moderate pain.   lisinopril (ZESTRIL) 5 MG tablet Take 1 tablet (5 mg total) by mouth daily. (Patient taking differently: Take 5 mg by mouth 2 (two) times daily.)   Multiple Vitamins-Iron (MULTIVITAMIN/IRON PO) Take 1 tablet by mouth daily.    ONETOUCH ULTRA test strip 1 each 2 (two) times daily.   sildenafil (REVATIO) 20 MG tablet    simvastatin (ZOCOR) 20 MG tablet Take 20 mg by mouth daily.    spironolactone (ALDACTONE) 25 MG tablet Take 1 tablet (25 mg total) by  mouth daily.   traZODone (DESYREL) 50 MG tablet Take 50 mg by mouth at bedtime.   [DISCONTINUED] carvedilol (COREG) 12.5 MG tablet Take 1.5 tablets (18.75 mg total) by mouth 2 (two) times daily.   [DISCONTINUED] spironolactone (ALDACTONE) 25 MG tablet Take 0.5 tablets (12.5 mg total) by mouth daily.     Allergies:   Other and Peanut-containing drug products   Social History   Socioeconomic History   Marital status: Married    Spouse name: Not on file   Number of children: 2   Years of education: Not on file   Highest education level: Not on file  Occupational History   Occupation: Retired, Psychologist, educational  Tobacco Use   Smoking status: Former    Packs/day: 1.00    Years: 12.00    Pack years: 12.00    Types: Cigarettes    Quit date: 05/28/2008    Years since quitting: 13.0   Smokeless tobacco: Never  Vaping Use   Vaping Use: Never used  Substance and Sexual Activity   Alcohol use: Yes    Comment: Occasionally 2 to 3 times a month   Drug use: Not Currently    Types: Marijuana    Comment: Smoked via a bowl occassionally.    Sexual activity: Not on file  Other Topics Concern   Not on file  Social History Narrative   Not on file   Social Determinants of Health   Financial Resource Strain: Not on file  Food Insecurity: Not on file  Transportation Needs: Not on file  Physical Activity: Not on file  Stress: Not on file  Social Connections: Not on file     Family History:  The patient's  family history includes Cancer in his sister; Diabetes in his mother; Heart failure in his mother and sister; Hypertension in his mother and sister.   ROS:   Please see the history of present illness.    ROS All other systems reviewed and are negative.   PHYSICAL EXAM:   VS:  BP (!) 160/96    Pulse 69    Ht 5\' 7"  (1.702 m)    Wt 217 lb 6.4 oz (98.6 kg)    SpO2 97%    BMI 34.05 kg/m   Physical Exam  SVX:BLTJQ, in no acute distress  Neck: no JVD, carotid bruits, or  masses Cardiac:RRR; no murmurs, rubs, or gallops  Respiratory:  clear to auscultation bilaterally, normal work of breathing GI: soft, nontender, nondistended, + BS Ext: without cyanosis, clubbing, or edema, Good distal pulses bilaterally Neuro:  Alert and Oriented x 3 Psych: euthymic mood, full affect  Wt Readings from Last 3 Encounters:  05/30/21 217 lb 6.4 oz (98.6 kg)  04/13/21 211 lb 3.2 oz (95.8 kg)  01/05/21 203 lb (92.1 kg)      Studies/Labs Reviewed:   EKG:  EKG is not ordered today.     Recent Labs:  04/06/2021: ALT 34; BUN 20; Creatinine, Ser 1.31; Hemoglobin 13.7; Platelets 249; Potassium 4.0; Sodium 137   Lipid Panel    Component Value Date/Time   CHOL 144 03/19/2013 0428   TRIG 110 03/19/2013 0428   HDL 49 03/19/2013 0428   CHOLHDL 2.9 03/19/2013 0428   VLDL 22 03/19/2013 0428   LDLCALC 73 03/19/2013 0428    Additional studies/ records that were reviewed today include:  Echo 08/2018  IMPRESSIONS     1. The left ventricle has normal systolic function, with an ejection  fraction of 55-60%. The cavity size was normal. There is moderate  concentric left ventricular hypertrophy. Left ventricular diastolic  Doppler parameters are consistent with impaired  relaxation. Indeterminate filling pressures No evidence of left  ventricular regional wall motion abnormalities.   2. The right ventricle has normal systolic function. The cavity was  normal. There is no increase in right ventricular wall thickness.   3. The mitral valve is grossly normal.   4. The tricuspid valve is grossly normal.   5. The aortic valve is tricuspid.   6. The aortic root is normal in size and structure.   FINDINGS   Left Ventricle: The left ventricle has normal systolic function, with an  ejection fraction of 55-60%. The cavity size was normal. There is moderate  concentric left ventricular hypertrophy. Left ventricular diastolic  Doppler parameters are consistent  with impaired  relaxation. Indeterminate filling pressures No evidence of  left ventricular regional wall motion abnormalities..   Right Ventricle: The right ventricle has normal systolic function. The  cavity was normal. There is no increase in right ventricular wall  thickness.   Left Atrium: Left atrial size was normal in size.   Right Atrium: Right atrial size was normal in size. Right atrial pressure  is estimated at 3 mmHg.    Risk Assessment/Calculations:         ASSESSMENT:    1. Essential hypertension   2. Hyperlipidemia, unspecified hyperlipidemia type   3. Nonischemic cardiomyopathy (HCC)      PLAN:  In order of problems listed above:  HTN BP continues to run high.  Getting a lot of salt in his diet.  Had a long discussion with him on 2 g sodium diet.  Increase carvedilol to 25 mg twice daily and spironolactone to 25 mg daily bmet today  HLD managed by PCP  NICM EF normal on echo 08/2018-compensated  Shared Decision Making/Informed Consent        Medication Adjustments/Labs and Tests Ordered: Current medicines are reviewed at length with the patient today.  Concerns regarding medicines are outlined above.  Medication changes, Labs and Tests ordered today are listed in the Patient Instructions below. Patient Instructions  Medication Instructions:   Increase Coreg to 25 mg Two Times Daily  Increase Spironolactone to 25 mg Daily   *If you need a refill on your cardiac medications before your next appointment, please call your pharmacy*  If you have labs (blood work) drawn today and your tests are completely normal, you will receive your results only by: [place] (if you have MyChart) OR A paper copy in the mail If you have any lab test that is abnormal or we need to change your treatment, we will call you to review the results.   Follow-Up: At [facility], you and your health needs are our priority.  As part of our continuing mission to provide you with  exceptional heart care, we have created designated Provider Care  Teams.  These Care Teams include your primary Cardiologist (physician) and Advanced Practice Providers (APPs -  Physician Assistants and Nurse Practitioners) who all work together to provide you with the care you need, when you need it.  We recommend signing up for the patient portal called "MyChart".  Sign up information is provided on this After Visit Summary.  MyChart is used to connect with patients for Virtual Visits (Telemedicine).  Patients are able to view lab/test results, encounter notes, upcoming appointments, etc.  Non-urgent messages can be sent to your provider as well.   To learn more about what you can do with MyChart, go to [email or web address].    Your next appointment:   2 month(s)  The format for your next appointment:   In Person  Provider:   Rozann Lesches, MD or Ermalinda Barrios, PA-C    Other Instructions Thank you for choosing [facility]!             :[phone]}       Signed, Ermalinda Barrios, PA-C  05/30/2021 1:20 PM    [address], [place], [place]  [address] Phone: [phone]; Fax: [phone]

## 2021-05-17 DIAGNOSIS — H2512 Age-related nuclear cataract, left eye: Secondary | ICD-10-CM | POA: Diagnosis not present

## 2021-05-30 ENCOUNTER — Other Ambulatory Visit: Payer: Self-pay

## 2021-05-30 ENCOUNTER — Ambulatory Visit: Payer: Medicare HMO | Admitting: Physician Assistant

## 2021-05-30 ENCOUNTER — Encounter: Payer: Self-pay | Admitting: Physician Assistant

## 2021-05-30 ENCOUNTER — Other Ambulatory Visit (HOSPITAL_COMMUNITY)
Admission: RE | Admit: 2021-05-30 | Discharge: 2021-05-30 | Disposition: A | Discharge status: Home / Self Care | Payer: Medicare HMO | Source: Ambulatory Visit | Attending: Physician Assistant | Admitting: Physician Assistant

## 2021-05-30 VITALS — BP 160/96 | HR 69 | Ht 67.0 in | Wt 217.4 lb

## 2021-05-30 DIAGNOSIS — I10 Essential (primary) hypertension: Secondary | ICD-10-CM | POA: Diagnosis not present

## 2021-05-30 DIAGNOSIS — E785 Hyperlipidemia, unspecified: Secondary | ICD-10-CM

## 2021-05-30 DIAGNOSIS — I428 Other cardiomyopathies: Secondary | ICD-10-CM | POA: Diagnosis not present

## 2021-05-30 LAB — BASIC METABOLIC PANEL
Anion gap: 6 (ref 5–15)
BUN: 19 mg/dL (ref 8–23)
CO2: 28 mmol/L (ref 22–32)
Calcium: 8.8 mg/dL — ABNORMAL LOW (ref 8.9–10.3)
Chloride: 105 mmol/L (ref 98–111)
Creatinine, Ser: 1.33 mg/dL — ABNORMAL HIGH (ref 0.61–1.24)
GFR, Estimated: 58 mL/min — ABNORMAL LOW (ref >60)
Glucose, Bld: 141 mg/dL — ABNORMAL HIGH (ref 70–99)
Potassium: 4.2 mmol/L (ref 3.5–5.1)
Sodium: 139 mmol/L (ref 135–145)

## 2021-05-30 MED ORDER — LISINOPRIL 5 MG PO TABS: 5.0000 mg | ORAL_TABLET | Freq: Two times a day (BID) | ORAL | 3 refills | Status: DC

## 2021-05-30 MED ORDER — SPIRONOLACTONE 25 MG PO TABS: 25.0000 mg | ORAL_TABLET | Freq: Every day | ORAL | 3 refills | Status: DC

## 2021-05-30 MED ORDER — CARVEDILOL 25 MG PO TABS: 25.0000 mg | ORAL_TABLET | Freq: Two times a day (BID) | ORAL | 3 refills | Status: DC

## 2021-05-30 NOTE — Patient Instructions (Addendum)
Medication Instructions:   Increase Coreg to 25 mg Two Times Daily  Increase Spironolactone to 25 mg Daily   *If you need a refill on your cardiac medications before your next appointment, please call your pharmacy*  If you have labs (blood work) drawn today and your tests are completely normal, you will receive your results only by: [place] (if you have MyChart) OR A paper copy in the mail If you have any lab test that is abnormal or we need to change your treatment, we will call you to review the results.   Follow-Up: At [facility], you and your health needs are our priority.  As part of our continuing mission to provide you with exceptional heart care, we have created designated Provider Care Teams.  These Care Teams include your primary Cardiologist (physician) and Advanced Practice Providers (APPs -  Physician Assistants and Nurse Practitioners) who all work together to provide you with the care you need, when you need it.  We recommend signing up for the patient portal called "MyChart".  Sign up information is provided on this After Visit Summary.  MyChart is used to connect with patients for Virtual Visits (Telemedicine).  Patients are able to view lab/test results, encounter notes, upcoming appointments, etc.  Non-urgent messages can be sent to your provider as well.   To learn more about what you can do with MyChart, go to [email or web address].    Your next appointment:   2 month(s)  The format for your next appointment:   In Person  Provider:   Rozann Lesches, MD or Ermalinda Barrios, PA-C    Other Instructions Thank you for choosing [facility]!             :[id number]}   Two Gram Sodium Diet 2000 mg  What is Sodium? Sodium is a mineral found naturally in many foods. The most significant source of sodium in the diet is table salt, which is about 40% sodium.  Processed, convenience, and preserved foods also contain a large amount of sodium.  The body  needs only 500 mg of sodium daily to function,  A normal diet provides more than enough sodium even if you do not use salt.  Why Limit Sodium? A build up of sodium in the body can cause thirst, increased blood pressure, shortness of breath, and water retention.  Decreasing sodium in the diet can reduce edema and risk of heart attack or stroke associated with high blood pressure.  Keep in mind that there are many other factors involved in these health problems.  Heredity, obesity, lack of exercise, cigarette smoking, stress and what you eat all play a role.  General Guidelines: Do not add salt at the table or in cooking.  One teaspoon of salt contains over 2 grams of sodium. Read food labels Avoid processed and convenience foods Ask your dietitian before eating any foods not dicussed in the menu planning guidelines Consult your physician if you wish to use a salt substitute or a sodium containing medication such as antacids.  Limit milk and milk products to 16 oz (2 cups) per day.  Shopping Hints: READ LABELS!! "Dietetic" does not necessarily mean low sodium. Salt and other sodium ingredients are often added to foods during processing.    Menu Planning Guidelines Food Group Choose More Often Avoid  Beverages (see also the milk group All fruit juices, low-sodium, salt-free vegetables juices, low-sodium carbonated beverages Regular vegetable or tomato juices, commercially softened water used for drinking or cooking  Breads and Cereals Enriched white, wheat, rye and pumpernickel bread, hard rolls and dinner rolls; muffins, cornbread and waffles; most dry cereals, cooked cereal without added salt; unsalted crackers and breadsticks; low sodium or homemade bread crumbs Bread, rolls and crackers with salted tops; quick breads; instant hot cereals; pancakes; commercial bread stuffing; self-rising flower and biscuit mixes; regular bread crumbs or cracker crumbs  Desserts and Sweets Desserts and sweets mad  with mild should be within allowance Instant pudding mixes and cake mixes  Fats Butter or margarine; vegetable oils; unsalted salad dressings, regular salad dressings limited to 1 Tbs; light, sour and heavy cream Regular salad dressings containing bacon fat, bacon bits, and salt pork; snack dips made with instant soup mixes or processed cheese; salted nuts  Fruits Most fresh, frozen and canned fruits Fruits processed with salt or sodium-containing ingredient (some dried fruits are processed with sodium sulfites        Vegetables Fresh, frozen vegetables and low- sodium canned vegetables Regular canned vegetables, sauerkraut, pickled vegetables, and others prepared in brine; frozen vegetables in sauces; vegetables seasoned with ham, bacon or salt pork  Condiments, Sauces, Miscellaneous  Salt substitute with physician's approval; pepper, herbs, spices; vinegar, lemon or lime juice; hot pepper sauce; garlic powder, onion powder, low sodium soy sauce (1 Tbs.); low sodium condiments (ketchup, chili sauce, mustard) in limited amounts (1 tsp.) fresh ground horseradish; unsalted tortilla chips, pretzels, potato chips, popcorn, salsa (1/4 cup) Any seasoning made with salt including garlic salt, celery salt, onion salt, and seasoned salt; sea salt, rock salt, kosher salt; meat tenderizers; monosodium glutamate; mustard, regular soy sauce, barbecue, sauce, chili sauce, teriyaki sauce, steak sauce, Worcestershire sauce, and most flavored vinegars; canned gravy and mixes; regular condiments; salted snack foods, olives, picles, relish, horseradish sauce, catsup   Food preparation: Try these seasonings Meats:    Pork Sage, onion Serve with applesauce  Chicken Poultry seasoning, thyme, parsley Serve with cranberry sauce  Lamb Curry powder, rosemary, garlic, thyme Serve with mint sauce or jelly  Veal Marjoram, basil Serve with current jelly, cranberry sauce  Beef Pepper, bay leaf Serve with dry mustard, unsalted  chive butter  [place] leaf, dill Serve with unsalted lemon butter, unsalted parsley butter  Vegetables:    Asparagus Lemon juice   Broccoli Lemon juice   Carrots Mustard dressing parsley, mint, nutmeg, glazed with unsalted butter and sugar   Green beans Marjoram, lemon juice, nutmeg,dill seed   Tomatoes Basil, marjoram, onion   Spice /blend for [employer]" 4 tsp ground thyme 1 tsp ground sage 3 tsp ground rosemary 4 tsp ground marjoram   Test your knowledge A product that says "Salt Free" may still contain sodium. True or False Garlic Powder and Hot Pepper Sauce an be used as alternative seasonings.True or False Processed foods have more sodium than fresh foods.  True or False Canned Vegetables have less sodium than froze True or False   WAYS TO DECREASE YOUR SODIUM INTAKE Avoid the use of added salt in cooking and at the table.  Table salt (and other prepared seasonings which contain salt) is probably one of the greatest sources of sodium in the diet.  Unsalted foods can gain flavor from the sweet, sour, and butter taste sensations of herbs and spices.  Instead of using salt for seasoning, try the following seasonings with the foods listed.  Remember: how you use them to enhance natural food flavors is limited only by your creativity... Allspice-Meat, fish, eggs, fruit, peas, red and  yellow vegetables Almond Extract-Fruit baked goods Anise Seed-Sweet breads, fruit, carrots, beets, cottage cheese, cookies (tastes like licorice) Basil-Meat, fish, eggs, vegetables, rice, vegetables salads, soups, sauces Bay Leaf-Meat, fish, stews, poultry Burnet-Salad, vegetables (cucumber-like flavor) Caraway Seed-Bread, cookies, cottage cheese, meat, vegetables, cheese, rice Cardamon-Baked goods, fruit, soups Celery Powder or seed-Salads, salad dressings, sauces, meatloaf, soup, bread.Do not use  celery salt Chervil-Meats, salads, fish, eggs, vegetables, cottage cheese (parsley-like flavor) Chili  Power-Meatloaf, chicken cheese, corn, eggplant, egg dishes Chives-Salads cottage cheese, egg dishes, soups, vegetables, sauces Cilantro-Salsa, casseroles Cinnamon-Baked goods, fruit, pork, lamb, chicken, carrots Cloves-Fruit, baked goods, fish, pot roast, green beans, beets, carrots Coriander-Pastry, cookies, meat, salads, cheese (lemon-orange flavor) Cumin-Meatloaf, fish,cheese, eggs, cabbage,fruit pie (caraway flavor) [employer], fruit, eggs, fish, poultry, cottage cheese, vegetables Dill Seed-Meat, cottage cheese, poultry, vegetables, fish, salads, bread Fennel Seed-Bread, cookies, apples, pork, eggs, fish, beets, cabbage, cheese, Licorice-like flavor Garlic-(buds or powder) Salads, meat, poultry, fish, bread, butter, vegetables, potatoes.Do not  use garlic salt Ginger-Fruit, vegetables, baked goods, meat, fish, poultry Horseradish Root-Meet, vegetables, butter Lemon Juice or Extract-Vegetables, fruit, tea, baked goods, fish salads Mace-Baked goods fruit, vegetables, fish, poultry (taste like nutmeg) Maple Extract-Syrups Marjoram-Meat, chicken, fish, vegetables, breads, green salads (taste like Sage) Mint-Tea, lamb, sherbet, vegetables, desserts, carrots, cabbage Mustard, Dry or Seed-Cheese, eggs, meats, vegetables, poultry Nutmeg-Baked goods, fruit, chicken, eggs, vegetables, desserts Onion Powder-Meat, fish, poultry, vegetables, cheese, eggs, bread, rice salads (Do not use   Onion salt) Orange Extract-Desserts, baked goods Oregano-Pasta, eggs, cheese, onions, pork, lamb, fish, chicken, vegetables, green salads Paprika-Meat, fish, poultry, eggs, cheese, vegetables Parsley Flakes-Butter, vegetables, meat fish, poultry, eggs, bread, salads (certain forms may   Contain sodium Pepper-Meat fish, poultry, vegetables, eggs Peppermint Extract-Desserts, baked goods Poppy Seed-Eggs, bread, cheese, fruit dressings, baked goods, noodles, vegetables, cottage  [employer], poultry, meat, fish, cauliflower, turnips,eggs bread Saffron-Rice, bread, veal, chicken, fish, eggs Sage-Meat, fish, poultry, onions, eggplant, tomateos, pork, stews Savory-Eggs, salads, poultry, meat, rice, vegetables, soups, pork Tarragon-Meat, poultry, fish, eggs, butter, vegetables (licorice-like flavor)  Thyme-Meat, poultry, fish, eggs, vegetables, (clover-like flavor), sauces, soups Tumeric-Salads, butter, eggs, fish, rice, vegetables (saffron-like flavor) Vanilla Extract-Baked goods, candy Vinegar-Salads, vegetables, meat marinades Walnut Extract-baked goods, candy   2. Choose your Foods Wisely   The following is a list of foods to avoid which are high in sodium:  Meats-Avoid all smoked, canned, salt cured, dried and kosher meat and fish as well as Anchovies   [employer] meats:Bologna, Liverwurst, Pastrami Canned meat or fish  Marinated herring Caviar    Pepperoni Corned Beef   Pizza Dried chipped beef  Salami Frozen breaded fish or meat Salt pork Frankfurters or hot dogs  Sardines Gefilte fish   Sausage Ham (boiled ham, Proscuitto Smoked butt    spiced ham)   Spam      TV Dinners Vegetables Canned vegetables (Regular) Relish Canned mushrooms  Sauerkraut Olives    Tomato juice Pickles  Bakery and Dessert Products Canned puddings  Cream pies Cheesecake   Decorated cakes Cookies  Beverages/Juices Tomato juice, regular  Gatorade   V-8 vegetable juice, regular  Breads and Cereals Biscuit mixes   Salted potato chips, corn chips, pretzels Bread stuffing mixes  Salted crackers and rolls Pancake and waffle mixes Self-rising flour  Seasonings Accent    Meat sauces Barbecue sauce  Meat tenderizer Catsup    Monosodium glutamate (MSG) Celery salt   Onion salt Chili sauce   Prepared mustard Garlic salt  Salt, seasoned salt, sea salt Gravy mixes   Soy sauce Horseradish   Steak sauce Ketchup   Tartar sauce Lite  salt    Teriyaki sauce Marinade mixes   Worcestershire sauce  Others Baking powder   Cocoa and cocoa mixes Baking soda   Commercial casserole mixes Candy-caramels, chocolate  Dehydrated soups    Bars, fudge,nougats  Instant rice and pasta mixes Canned broth or soup  Maraschino cherries Cheese, aged and processed cheese and cheese spreads  Learning Assessment Quiz  Indicated T (for True) or F (for False) for each of the following statements:  _____ Fresh fruits and vegetables and unprocessed grains are generally low in sodium _____ Water may contain a considerable amount of sodium, depending on the source _____ You can always tell if a food is high in sodium by tasting it _____ Certain laxatives my be high in sodium and should be avoided unless prescribed   by a physician or pharmacist _____ Salt substitutes may be used freely by anyone on a sodium restricted diet _____ Sodium is present in table salt, food additives and as a natural component of   most foods _____ Table salt is approximately 90% sodium _____ Limiting sodium intake may help prevent excess fluid accumulation in the body _____ On a sodium-restricted diet, seasonings such as bouillon soy sauce, and    cooking wine should be used in place of table salt _____ On an ingredient list, a product which lists monosodium glutamate as the first   ingredient is an appropriate food to include on a low sodium diet  Circle the best answer(s) to the following statements (Hint: there may be more than one correct answer)  11. On a low-sodium diet, some acceptable snack items are:    A. Olives  F. Bean dip   K. Grapefruit juice    B. Salted Pretzels G. Commercial Popcorn   L. Canned peaches    C. Carrot Sticks  H. Bouillon   M. Unsalted nuts   D. [place] fries  I. Peanut butter crackers N. Salami   E. Sweet pickles J. Tomato Juice   O. Pizza  12.  Seasonings that may be used freely on a reduced - sodium diet include   A. Lemon  wedges F.Monosodium glutamate K. Celery seed    B.Soysauce   G. Pepper   L. Mustard powder   C. Sea salt  H. Cooking wine  M. Onion flakes   D. Vinegar  E. Prepared horseradish N. Salsa   E. Sage   J. Worcestershire sauce  O. Chutney

## 2021-06-08 DIAGNOSIS — H2511 Age-related nuclear cataract, right eye: Secondary | ICD-10-CM | POA: Diagnosis not present

## 2021-06-08 DIAGNOSIS — H25011 Cortical age-related cataract, right eye: Secondary | ICD-10-CM | POA: Diagnosis not present

## 2021-06-14 DIAGNOSIS — H25011 Cortical age-related cataract, right eye: Secondary | ICD-10-CM | POA: Diagnosis not present

## 2021-06-14 DIAGNOSIS — H5703 Miosis: Secondary | ICD-10-CM | POA: Diagnosis not present

## 2021-06-14 DIAGNOSIS — H21561 Pupillary abnormality, right eye: Secondary | ICD-10-CM | POA: Diagnosis not present

## 2021-06-14 DIAGNOSIS — H2511 Age-related nuclear cataract, right eye: Secondary | ICD-10-CM | POA: Diagnosis not present

## 2021-06-14 DIAGNOSIS — H25811 Combined forms of age-related cataract, right eye: Secondary | ICD-10-CM | POA: Diagnosis not present

## 2021-06-23 ENCOUNTER — Other Ambulatory Visit: Payer: Self-pay | Admitting: Cardiology

## 2021-06-23 DIAGNOSIS — H2511 Age-related nuclear cataract, right eye: Secondary | ICD-10-CM | POA: Diagnosis not present

## 2021-06-28 DIAGNOSIS — N183 Chronic kidney disease, stage 3 unspecified: Secondary | ICD-10-CM | POA: Diagnosis not present

## 2021-06-28 DIAGNOSIS — I5032 Chronic diastolic (congestive) heart failure: Secondary | ICD-10-CM | POA: Diagnosis not present

## 2021-06-28 DIAGNOSIS — I130 Hypertensive heart and chronic kidney disease with heart failure and stage 1 through stage 4 chronic kidney disease, or unspecified chronic kidney disease: Secondary | ICD-10-CM | POA: Diagnosis not present

## 2021-06-28 DIAGNOSIS — E1122 Type 2 diabetes mellitus with diabetic chronic kidney disease: Secondary | ICD-10-CM | POA: Diagnosis not present

## 2021-07-26 NOTE — Progress Notes (Signed)
? ?Cardiology Office Note   ? ?Date:  08/01/2021  ? ?ID:  Corey Murphy, DOB July 31, 1950, MRN [id number] ? ? ?PCP:  Redmond School, MD ?  ?[place]  ?Cardiologist:  Rozann Lesches, MD   ?Advanced Practice Provider:  No care team member to display ?Electrophysiologist:  None  ? ?[id number]}  ? ?Chief Complaint  ?Patient presents with  ? Follow-up  ? ? ?History of Present Illness:  ?Corey Murphy is a 71 y.o. male  with a history of HTN, HLD, NICM w/ EF 40% >> 55%, IgG lambda monoclonal gammopathy. ?  ?Patient saw Ms. Barrett 12/2019 and BP running high and had extra fluid onboard. Given extra lasix for 3 days and coreg increased. BP running high in Dec and Dr. Domenic Polite increased lisinopril 5 mg bid. He went to ED 04/23/21 and BP high-no changes made. ? ?I saw the patient 05/30/21 and BP continued to run high, getting extra salt in diet. Coreg increased 25 mg bid and spiro 25 mg daily. ? ?Patient comes in for f/u. BP much better. Watching salt closer. No chest pain, dyspnea, edema, dizziness. Walking 5 miles 3-4 days/week or gets on treadmill.  ? ? ?Past Medical History:  ?Diagnosis Date  ? Alcohol dependency ([place])   ? Chronic kidney disease   ? Colonic adenoma 2006  ? Essential hypertension   ? History of cardiac catheterization   ? 03/2013 LM nl, LAD nl, D1/2/3 small - nl, LCX nondom - nl, OM1/2/3 nl, RCA dom  ? Hypercholesteremia   ? IgG lambda monoclonal gammopathy 06/24/2015  ? Nonischemic cardiomyopathy ([place])   ? LVEF approximately 40% 06/2013 - improved to normal range  ? Obesity   ? Type 2 diabetes mellitus ([place])   ? ? ?Past Surgical History:  ?Procedure Laterality Date  ? CIRCUMCISION    ? COLONOSCOPY  05/20/2004  ? RMR: Internal hemorrhoids.  Diminutive adenomatous polyp in the mid-right colon, otherwise normal  ? COLONOSCOPY N/A 06/19/2012  ? Procedure: COLONOSCOPY;  Surgeon: Daneil Dolin, MD;  [facility];  Service: Endoscopy;  Laterality: N/A;  8:30AM-rescheduled 10:30am  Darius Bump notified pt  ? COLONOSCOPY N/A 08/08/2017  ? Surgeon: Daneil Dolin, MD;  diverticulosis in sigmoid, descending, and transverse colon, three 4-7 mm polyps in ascending colon, and one 9 mm polyp in ascending colon. Pathology with tubular adenomas. Repeat in 3 years.  ? COLONOSCOPY N/A 04/23/2019  ? Sigmoid and descending colon diverticulosis, one 5 mm polyp (tubular adenoma) in descending colon, non-bleeding internal hemorrhoids. 5 year surveillance.   ? Ileocolonoscopy  06/28/2007  ? YME:BRAXEN rectum/Submucosal sigmoid diverticula (chronic), doubt clinical significance Hyperplastic polypoid-appearing fold, mid ascending colon, status/Remainder colonic mucosa and terminal ileum mucosa appeared normal   ? LEFT AND RIGHT HEART CATHETERIZATION WITH CORONARY ANGIOGRAM  03/19/2013  ? Procedure: LEFT AND RIGHT HEART CATHETERIZATION WITH CORONARY ANGIOGRAM;  Surgeon: Wellington Hampshire, MD;  Location: [facility] CATH LAB;  Service: Cardiovascular;;  ? POLYPECTOMY  08/08/2017  ? Procedure: POLYPECTOMY;  Surgeon: Daneil Dolin, MD;  Location: [facility];  Service: Endoscopy;;  ascending colon polyps x4 (cold snare-3,hot snare)  ? TOTAL HIP ARTHROPLASTY  2010  ? Left  ? TOTAL HIP ARTHROPLASTY  2010  ? Right  ? ? ?Current Medications: ?Current Meds  ?Medication Sig  ? aspirin 81 MG tablet Take 81 mg by mouth daily.  ? brimonidine (ALPHAGAN) 0.2 % ophthalmic solution Place 1 drop into the right eye 2 (two) times daily.  ?  carvedilol (COREG) 25 MG tablet Take 1 tablet (25 mg total) by mouth 2 (two) times daily.  ? furosemide (LASIX) 20 MG tablet TAKE 1 TABLET BY MOUTH EVERY DAY  ? glimepiride (AMARYL) 4 MG tablet Take 4 mg by mouth 2 (two) times daily.  ? HYDROcodone-acetaminophen (NORCO) 10-325 MG tablet Take 1 tablet by mouth every 6 (six) hours as needed for moderate pain.  ? JANUVIA 50 MG tablet Take 50 mg by mouth daily.  ? lisinopril (ZESTRIL) 5 MG tablet Take 1 tablet (5 mg total) by mouth 2 (two) times daily.  ?  Multiple Vitamins-Iron (MULTIVITAMIN/IRON PO) Take 1 tablet by mouth daily.   ? ONETOUCH ULTRA test strip 1 each 2 (two) times daily.  ? sildenafil (REVATIO) 20 MG tablet   ? simvastatin (ZOCOR) 20 MG tablet Take 20 mg by mouth daily.   ? spironolactone (ALDACTONE) 25 MG tablet Take 1 tablet (25 mg total) by mouth daily.  ? traZODone (DESYREL) 50 MG tablet Take 50 mg by mouth at bedtime.  ? zolpidem (AMBIEN) 10 MG tablet Take 10 mg by mouth at bedtime.  ?  ? ?Allergies:   Other and Peanut-containing drug products  ? ?Social History  ? ?Socioeconomic History  ? Marital status: Married  ?  Spouse name: Not on file  ? Number of children: 2  ? Years of education: Not on file  ? Highest education level: Not on file  ?Occupational History  ? Occupation: Retired, Psychologist, educational  ?Tobacco Use  ? Smoking status: Former  ?  Packs/day: 1.00  ?  Years: 12.00  ?  Pack years: 12.00  ?  Types: Cigarettes  ?  Quit date: 05/28/2008  ?  Years since quitting: 13.1  ? Smokeless tobacco: Never  ?Vaping Use  ? Vaping Use: Never used  ?Substance and Sexual Activity  ? Alcohol use: Yes  ?  Comment: Occasionally 2 to 3 times a month  ? Drug use: Not Currently  ?  Types: Marijuana  ?  Comment: Smoked via a bowl occassionally.   ? Sexual activity: Not on file  ?Other Topics Concern  ? Not on file  ?Social History Narrative  ? Not on file  ? ?Social Determinants of Health  ? ?Financial Resource Strain: Not on file  ?Food Insecurity: Not on file  ?Transportation Needs: Not on file  ?Physical Activity: Not on file  ?Stress: Not on file  ?Social Connections: Not on file  ?  ? ?Family History:  The patient's  family history includes Cancer in his sister; Diabetes in his mother; Heart failure in his mother and sister; Hypertension in his mother and sister.  ? ?ROS:   ?Please see the history of present illness.    ?ROS All other systems reviewed and are negative. ? ? ?PHYSICAL EXAM:   ?VS:  BP 116/70   Pulse 67   Ht '5\' 7"'$  (1.702 m)   Wt 204 lb  (92.5 kg)   SpO2 98%   BMI 31.95 kg/m?   ?Physical Exam  ?GEN: Well nourished, well developed, in no acute distress  ?Neck: no JVD, carotid bruits, or masses ?Cardiac:RRR; no murmurs, rubs, or gallops  ?Respiratory:  clear to auscultation bilaterally, normal work of breathing ?GI: soft, nontender, nondistended, + BS ?Ext: without cyanosis, clubbing, or edema, Good distal pulses bilaterally ?Neuro:  Alert and Oriented x 3, Strength and sensation are intact ?Psych: euthymic mood, full affect ? ?Wt Readings from Last 3 Encounters:  ?08/01/21 204 lb (92.5 kg)  ?  05/30/21 217 lb 6.4 oz (98.6 kg)  ?04/13/21 211 lb 3.2 oz (95.8 kg)  ?  ? ? ?Studies/Labs Reviewed:  ? ?EKG:  EKG is not ordered today.   ? ?Recent Labs: ?04/06/2021: ALT 34; Hemoglobin 13.7; Platelets 249 ?05/30/2021: BUN 19; Creatinine, Ser 1.33; Potassium 4.2; Sodium 139  ? ?Lipid Panel ?   ?Component Value Date/Time  ? CHOL 144 03/19/2013 0428  ? TRIG 110 03/19/2013 0428  ? HDL 49 03/19/2013 0428  ? CHOLHDL 2.9 03/19/2013 0428  ? VLDL 22 03/19/2013 0428  ? [place] 73 03/19/2013 0428  ? ? ?Additional studies/ records that were reviewed today include:  ?[place] 08/2018 ? ?IMPRESSIONS  ? ? ? 1. The left ventricle has normal systolic function, with an ejection  ?fraction of 55-60%. The cavity size was normal. There is moderate  ?concentric left ventricular hypertrophy. Left ventricular diastolic  ?Doppler parameters are consistent with impaired  ?relaxation. Indeterminate filling pressures No evidence of left  ?ventricular regional wall motion abnormalities.  ? 2. The right ventricle has normal systolic function. The cavity was  ?normal. There is no increase in right ventricular wall thickness.  ? 3. The mitral valve is grossly normal.  ? 4. The tricuspid valve is grossly normal.  ? 5. The aortic valve is tricuspid.  ? 6. The aortic root is normal in size and structure.  ? ?FINDINGS  ? Left Ventricle: The left ventricle has normal systolic function, with an   ?ejection fraction of 55-60%. The cavity size was normal. There is moderate  ?concentric left ventricular hypertrophy. Left ventricular diastolic  ?Doppler parameters are consistent  ?with impaired relaxation. Indetermina

## 2021-07-31 ENCOUNTER — Other Ambulatory Visit: Payer: Self-pay | Admitting: Cardiology

## 2021-08-01 ENCOUNTER — Other Ambulatory Visit (HOSPITAL_COMMUNITY)
Admission: RE | Admit: 2021-08-01 | Discharge: 2021-08-01 | Disposition: A | Discharge status: Home / Self Care | Payer: Medicare HMO | Source: Ambulatory Visit | Attending: Physician Assistant | Admitting: Physician Assistant

## 2021-08-01 ENCOUNTER — Encounter: Payer: Self-pay | Admitting: Physician Assistant

## 2021-08-01 ENCOUNTER — Ambulatory Visit (INDEPENDENT_AMBULATORY_CARE_PROVIDER_SITE_OTHER): Payer: Medicare HMO | Admitting: Physician Assistant

## 2021-08-01 VITALS — BP 116/70 | HR 67 | Ht 67.0 in | Wt 204.0 lb

## 2021-08-01 DIAGNOSIS — E785 Hyperlipidemia, unspecified: Secondary | ICD-10-CM | POA: Diagnosis not present

## 2021-08-01 DIAGNOSIS — I428 Other cardiomyopathies: Secondary | ICD-10-CM

## 2021-08-01 DIAGNOSIS — I10 Essential (primary) hypertension: Secondary | ICD-10-CM

## 2021-08-01 LAB — BASIC METABOLIC PANEL
Anion gap: 8 (ref 5–15)
BUN: 45 mg/dL — ABNORMAL HIGH (ref 8–23)
CO2: 27 mmol/L (ref 22–32)
Calcium: 9.2 mg/dL (ref 8.9–10.3)
Chloride: 103 mmol/L (ref 98–111)
Creatinine, Ser: 1.84 mg/dL — ABNORMAL HIGH (ref 0.61–1.24)
GFR, Estimated: 39 mL/min — ABNORMAL LOW (ref >60)
Glucose, Bld: 187 mg/dL — ABNORMAL HIGH (ref 70–99)
Potassium: 4.3 mmol/L (ref 3.5–5.1)
Sodium: 138 mmol/L (ref 135–145)

## 2021-08-01 NOTE — Patient Instructions (Signed)
Medication Instructions:  ?Your physician recommends that you continue on your current medications as directed. Please refer to the Current Medication list given to you today. ? ?*If you need a refill on your cardiac medications before your next appointment, please call your pharmacy* ? ? ?Lab Work: ?Your physician recommends that you return for lab work in: Today Paramedic)  ? ?If you have labs (blood work) drawn today and your tests are completely normal, you will receive your results only by: ?MyChart Message (if you have MyChart) OR ?A paper copy in the mail ?If you have any lab test that is abnormal or we need to change your treatment, we will call you to review the results. ? ? ?Testing/Procedures: ?NONE  ? ? ?Follow-Up: ?At [facility], you and your health needs are our priority.  As part of our continuing mission to provide you with exceptional heart care, we have created designated Provider Care Teams.  These Care Teams include your primary Cardiologist (physician) and Advanced Practice Providers (APPs -  Physician Assistants and Nurse Practitioners) who all work together to provide you with the care you need, when you need it. ? ?We recommend signing up for the patient portal called "MyChart".  Sign up information is provided on this After Visit Summary.  MyChart is used to connect with patients for Virtual Visits (Telemedicine).  Patients are able to view lab/test results, encounter notes, upcoming appointments, etc.  Non-urgent messages can be sent to your provider as well.   ?To learn more about what you can do with MyChart, go to [email or web address].   ? ?Your next appointment:   ?6 month(s) ? ?The format for your next appointment:   ?In Person ? ?Provider:   ?Rozann Lesches, MD  ? ? ?Other Instructions ?Thank you for choosing [facility]! ? ? ? ?

## 2021-08-02 ENCOUNTER — Telehealth: Payer: Self-pay | Admitting: *Deleted

## 2021-08-02 DIAGNOSIS — I10 Essential (primary) hypertension: Secondary | ICD-10-CM

## 2021-08-02 MED ORDER — SPIRONOLACTONE 25 MG PO TABS: 12.5000 mg | ORAL_TABLET | Freq: Every day | ORAL | 3 refills | Status: DC

## 2021-08-02 MED ORDER — FUROSEMIDE 20 MG PO TABS: 20.0000 mg | ORAL_TABLET | ORAL | 11 refills | Status: DC | PRN

## 2021-08-02 NOTE — Telephone Encounter (Signed)
-----   Message from Imogene Burn, PA-C sent at 08/01/2021  2:30 PM EDT ----- ?Kidney function up. Reduce spironolactone 12.5 mg(1/2 of 25 mg) daily. And stop lasix. Can take lasix prn for weight gain 2-3 lbs overnight or 5 lbs in a week. Continue to watch salt closely. This should also help with his low BP's. Call if BP goes up. Bmet in 2 weeks. Thanks ? ?

## 2021-08-02 NOTE — Telephone Encounter (Signed)
Pt notified and orders placed  

## 2021-08-15 ENCOUNTER — Encounter (INDEPENDENT_AMBULATORY_CARE_PROVIDER_SITE_OTHER): Payer: Medicare HMO | Admitting: Ophthalmology

## 2021-08-16 ENCOUNTER — Other Ambulatory Visit (HOSPITAL_COMMUNITY)
Admission: RE | Admit: 2021-08-16 | Discharge: 2021-08-16 | Disposition: A | Discharge status: Home / Self Care | Payer: Medicare HMO | Source: Ambulatory Visit | Attending: Physician Assistant | Admitting: Physician Assistant

## 2021-08-16 DIAGNOSIS — I10 Essential (primary) hypertension: Secondary | ICD-10-CM | POA: Diagnosis not present

## 2021-08-16 LAB — BASIC METABOLIC PANEL
Anion gap: 6 (ref 5–15)
BUN: 27 mg/dL — ABNORMAL HIGH (ref 8–23)
CO2: 25 mmol/L (ref 22–32)
Calcium: 8.9 mg/dL (ref 8.9–10.3)
Chloride: 104 mmol/L (ref 98–111)
Creatinine, Ser: 1.55 mg/dL — ABNORMAL HIGH (ref 0.61–1.24)
GFR, Estimated: 48 mL/min — ABNORMAL LOW (ref >60)
Glucose, Bld: 195 mg/dL — ABNORMAL HIGH (ref 70–99)
Potassium: 4.5 mmol/L (ref 3.5–5.1)
Sodium: 135 mmol/L (ref 135–145)

## 2021-08-18 DIAGNOSIS — Z0100 Encounter for examination of eyes and vision without abnormal findings: Secondary | ICD-10-CM | POA: Diagnosis not present

## 2021-08-29 ENCOUNTER — Encounter (INDEPENDENT_AMBULATORY_CARE_PROVIDER_SITE_OTHER): Payer: Medicare HMO | Admitting: Ophthalmology

## 2021-08-29 DIAGNOSIS — H43813 Vitreous degeneration, bilateral: Secondary | ICD-10-CM | POA: Diagnosis not present

## 2021-08-29 DIAGNOSIS — H35033 Hypertensive retinopathy, bilateral: Secondary | ICD-10-CM

## 2021-08-29 DIAGNOSIS — H35373 Puckering of macula, bilateral: Secondary | ICD-10-CM | POA: Diagnosis not present

## 2021-08-29 DIAGNOSIS — I10 Essential (primary) hypertension: Secondary | ICD-10-CM | POA: Diagnosis not present

## 2021-10-03 ENCOUNTER — Ambulatory Visit
Admission: EM | Admit: 2021-10-03 | Discharge: 2021-10-03 | Disposition: A | Discharge status: Home / Self Care | Payer: Medicare HMO | Attending: Nurse Practitioner | Admitting: Nurse Practitioner

## 2021-10-03 DIAGNOSIS — L02411 Cutaneous abscess of right axilla: Secondary | ICD-10-CM | POA: Diagnosis not present

## 2021-10-03 MED ORDER — DOXYCYCLINE HYCLATE 100 MG PO TABS: 100.0000 mg | ORAL_TABLET | Freq: Two times a day (BID) | ORAL | 0 refills | Status: DC

## 2021-10-03 NOTE — ED Provider Notes (Signed)
[facility]    CSN: [id number] Arrival date & time: 10/03/21  0908      History   Chief Complaint Chief Complaint  Patient presents with   Abscess    HPI Corey Murphy is a 71 y.o. male.    Abscess Patient presents with swelling and pain under the right armpit for the past 4 days.  Patient states over the past several days, the area has gotten larger and has become more painful.  He denies any fever, chills, malaise, abdominal pain, foul-smelling drainage.  Patient has been keeping the area clean and dry with soap and water.  He has also been using warm compresses.  Patient does have a history of type 2 diabetes, states his last A1c was around 7.  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   Nonischemic cardiomyopathy ([place])    LVEF approximately 40% 06/2013 - improved to normal range   Obesity    Type 2 diabetes mellitus [facility])     Patient Active Problem List   Diagnosis Date Noted   Rectal bleeding 02/17/2019   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  Diminutive adenomatous polyp in the mid-right colon, otherwise normal   COLONOSCOPY N/A 06/19/2012   Procedure: COLONOSCOPY;  Surgeon: Daneil Dolin, MD;   [facility];  Service: Endoscopy;  Laterality: N/A;  8:30AM-rescheduled 10:30am Darius Bump notified pt   COLONOSCOPY N/A 08/08/2017   Surgeon: Daneil Dolin, MD;  diverticulosis in sigmoid, descending, and transverse colon, three 4-7 mm polyps in ascending colon, and one 9 mm polyp in ascending colon. Pathology with tubular adenomas. Repeat in 3 years.   COLONOSCOPY N/A 04/23/2019   Sigmoid and descending colon diverticulosis, one 5 mm polyp (tubular adenoma) in descending colon, non-bleeding internal hemorrhoids. 5 year surveillance.    Ileocolonoscopy  06/28/2007   WUX:[id number] rectum/Submucosal sigmoid diverticula (chronic), doubt clinical significance Hyperplastic polypoid-appearing fold, mid ascending colon, status/Remainder colonic mucosa and terminal ileum mucosa appeared normal    LEFT AND RIGHT HEART CATHETERIZATION WITH CORONARY ANGIOGRAM  03/19/2013   Procedure: LEFT AND RIGHT HEART CATHETERIZATION WITH CORONARY ANGIOGRAM;  Surgeon: Wellington Hampshire, MD;  Location: [facility] CATH [facility];  Service: Cardiovascular;;   POLYPECTOMY  08/08/2017   Procedure: POLYPECTOMY;  Surgeon: Daneil Dolin, MD;  [facility]: [facility];  Service: Endoscopy;;  ascending colon polyps x4 (cold snare-3,hot snare)   TOTAL HIP ARTHROPLASTY  2010   Left   TOTAL HIP ARTHROPLASTY  2010   Right       Home Medications    Prior to Admission medications   Medication Sig Start Date End Date Taking? Authorizing Provider  doxycycline (VIBRA-TABS) 100 MG tablet  Take 1 tablet (100 mg total) by mouth 2 (two) times daily. 10/03/21  Yes Takeila Thayne-Warren, Alda Lea, NP  aspirin 81 MG tablet Take 81 mg by mouth daily.    [provider]  brimonidine (ALPHAGAN) 0.2 % ophthalmic solution Place 1 drop into the right eye 2 (two) times daily. 06/13/21   [provider]  carvedilol (COREG) 25 MG tablet Take 1 tablet (25 mg total) by mouth 2 (two) times daily. 05/30/21 08/28/21  Imogene Burn, PA-C  furosemide  (LASIX) 20 MG tablet Take 1 tablet (20 mg total) by mouth as needed (For weight gain of 2-3 LBS overnight or 5 LBS in one week). 08/02/21 10/31/21  Imogene Burn, PA-C  glimepiride (AMARYL) 4 MG tablet Take 4 mg by mouth 2 (two) times daily. 03/28/21   [provider]  HYDROcodone-acetaminophen (NORCO) 10-325 MG tablet Take 1 tablet by mouth every 6 (six) hours as needed for moderate pain.    [provider]  JANUVIA 50 MG tablet Take 50 mg by mouth daily. 08/01/21   [provider]  lisinopril (ZESTRIL) 5 MG tablet Take 1 tablet (5 mg total) by mouth 2 (two) times daily. 05/30/21 08/28/21  Imogene Burn, PA-C  Multiple Vitamins-Iron (MULTIVITAMIN/IRON PO) Take 1 tablet by mouth daily.     [provider]  [facility] ULTRA test strip 1 each 2 (two) times daily. 03/24/20   [provider]  sildenafil (REVATIO) 20 MG tablet  07/30/20   [provider]  simvastatin (ZOCOR) 20 MG tablet Take 20 mg by mouth daily.  10/14/15   [provider]  spironolactone (ALDACTONE) 25 MG tablet Take 0.5 tablets (12.5 mg total) by mouth daily. 08/02/21 10/31/21  Imogene Burn, PA-C  traZODone (DESYREL) 50 MG tablet Take 50 mg by mouth at bedtime. 01/29/20   [provider]  zolpidem (AMBIEN) 10 MG tablet Take 10 mg by mouth at bedtime. 05/09/21   [provider]    Family History Family History  Problem Relation Age of Onset   Heart failure Mother    Diabetes Mother    Hypertension Mother    Heart failure Sister    Hypertension Sister    Cancer Sister    Colon cancer Neg Hx     Social History Social History   Tobacco Use   Smoking status: Former    Packs/day: 1.00    Years: 12.00    Pack years: 12.00    Types: Cigarettes    Quit date: 05/28/2008    Years since quitting: 13.3   Smokeless tobacco: Never  Vaping Use   Vaping Use: Never used  Substance Use Topics   Alcohol use: Yes    Comment: Occasionally 2 to 3 times a month    Drug use: Not Currently    Types: Marijuana    Comment: Smoked via a bowl occassionally.      Allergies   Other and Peanut-containing drug products   Review of Systems Review of Systems Per HPI  Physical Exam Triage Vital Signs ED Triage Vitals  Enc Vitals Group     BP 10/03/21 0955 116/78     Pulse Rate 10/03/21 0955 68     Resp 10/03/21 0955 18     Temp 10/03/21 0955 98 F (36.7 C)     Temp src --      SpO2 10/03/21 0955 96 %     Weight --      Height --  Head Circumference --      Peak Flow --      Pain Score 10/03/21 0954 6     Pain Loc --      Pain Edu? --      Excl. in [place]? --    No data found.  Updated Vital Signs BP 116/78   Pulse 68   Temp 98 F (36.7 C)   Resp 18   SpO2 96%   Visual Acuity Right Eye Distance:   Left Eye Distance:   Bilateral Distance:    Right Eye Near:   Left Eye Near:    Bilateral Near:     Physical Exam Vitals and nursing note reviewed.  Constitutional:      General: He is not in acute distress.    Appearance: He is well-developed.  HENT:     Head: Normocephalic and atraumatic.  Eyes:     Conjunctiva/sclera: Conjunctivae normal.  Cardiovascular:     Rate and Rhythm: Normal rate and regular rhythm.     Heart sounds: No murmur heard. Pulmonary:     Effort: Pulmonary effort is normal. No respiratory distress.     Breath sounds: Normal breath sounds.  Abdominal:     Palpations: Abdomen is soft.     Tenderness: There is no abdominal tenderness.  Musculoskeletal:        General: No swelling.     Cervical back: Neck supple.  Skin:    General: Skin is warm and dry.     Capillary Refill: Capillary refill takes less than 2 seconds.     Findings: Abscess and erythema present.     Comments: Abscess to right axilla.  Area measures approximately 3 cm x 3 cm.  Tenderness and warmth to palpation.  Nonfluctuant, no drainage present.  Neurological:     Mental Status: He is alert.  Psychiatric:        Mood and Affect:  Mood normal.     UC Treatments / Results  Labs (all labs ordered are listed, but only abnormal results are displayed) Labs Reviewed - No data to display  EKG   Radiology No results found.  Procedures Procedures (including critical care time)  Medications Ordered in UC Medications - No data to display  Initial Impression / Assessment and Plan / UC Course  I have reviewed the triage vital signs and the nursing notes.  Pertinent labs & imaging results that were available during my care of the patient were reviewed by me and considered in my medical decision making (see chart for details).  Patient presents with an abscess under the right axilla.  Area is nonfluctuant, not ready for I&D at this time.  We will start patient on doxycycline for 10 days.  Supportive care was also recommended to include cleaning the area with Dial Gold bar soap, warm compresses, and Tylenol for pain or fever.  Patient was given indications of when to return to our clinic versus of when to go to the ER.  Advised to follow-up as needed. Final Clinical Impressions(s) / UC Diagnoses   Final diagnoses:  Abscess of axilla, right     Discharge Instructions      3-4Take medication as prescribed. Continue warm compresses to the affected area.  Apply times daily while symptoms persist. If the area begins to drain, cover with a dressing until drainage has completed. Clean the area twice daily with Dial Gold bar soap.  This is an antibacterial soap which should help with your symptoms. May  take over-the-counter Tylenol extra strength 500 mg, take 1 tablet every 8 hours as needed for pain or discomfort. Follow-up in our clinic if your symptoms do not improve.  If you develop fever, chills, generalized fatigue, abdominal pain, with nausea and vomiting, go to the emergency department. Follow-up as needed.     ED Prescriptions     Medication Sig Dispense Auth. Provider   doxycycline (VIBRA-TABS) 100 MG  tablet Take 1 tablet (100 mg total) by mouth 2 (two) times daily. 20 tablet Anecia Nusbaum-Warren, Alda Lea, NP      PDMP not reviewed this encounter.   Tish Men, NP 10/03/21 1022

## 2021-10-03 NOTE — ED Triage Notes (Signed)
Pt presents with abscess under right axilla for past 5 days

## 2021-10-03 NOTE — Discharge Instructions (Addendum)
3-4Take medication as prescribed. Continue warm compresses to the affected area.  Apply times daily while symptoms persist. If the area begins to drain, cover with a dressing until drainage has completed. Clean the area twice daily with Dial Gold bar soap.  This is an antibacterial soap which should help with your symptoms. May take over-the-counter Tylenol extra strength 500 mg, take 1 tablet every 8 hours as needed for pain or discomfort. Follow-up in our clinic if your symptoms do not improve.  If you develop fever, chills, generalized fatigue, abdominal pain, with nausea and vomiting, go to the emergency department. Follow-up as needed.

## 2021-10-05 ENCOUNTER — Ambulatory Visit (HOSPITAL_COMMUNITY)
Admission: RE | Admit: 2021-10-05 | Discharge: 2021-10-05 | Disposition: A | Discharge status: Home / Self Care | Payer: Medicare HMO | Source: Ambulatory Visit | Attending: Hematology | Admitting: Hematology

## 2021-10-05 ENCOUNTER — Inpatient Hospital Stay (HOSPITAL_COMMUNITY): Payer: Medicare HMO | Attending: Hematology

## 2021-10-05 DIAGNOSIS — Z87891 Personal history of nicotine dependence: Secondary | ICD-10-CM | POA: Insufficient documentation

## 2021-10-05 DIAGNOSIS — D472 Monoclonal gammopathy: Secondary | ICD-10-CM | POA: Insufficient documentation

## 2021-10-05 DIAGNOSIS — I129 Hypertensive chronic kidney disease with stage 1 through stage 4 chronic kidney disease, or unspecified chronic kidney disease: Secondary | ICD-10-CM | POA: Diagnosis not present

## 2021-10-05 DIAGNOSIS — N189 Chronic kidney disease, unspecified: Secondary | ICD-10-CM | POA: Diagnosis not present

## 2021-10-05 DIAGNOSIS — Z809 Family history of malignant neoplasm, unspecified: Secondary | ICD-10-CM | POA: Diagnosis not present

## 2021-10-05 DIAGNOSIS — E1122 Type 2 diabetes mellitus with diabetic chronic kidney disease: Secondary | ICD-10-CM | POA: Insufficient documentation

## 2021-10-05 DIAGNOSIS — C9000 Multiple myeloma not having achieved remission: Secondary | ICD-10-CM | POA: Insufficient documentation

## 2021-10-05 LAB — CBC WITH DIFFERENTIAL/PLATELET
Abs Immature Granulocytes: 0.02 10*3/uL (ref 0.00–0.07)
Basophils Absolute: 0 10*3/uL (ref 0.0–0.1)
Basophils Relative: 1 %
Eosinophils Absolute: 0.1 10*3/uL (ref 0.0–0.5)
Eosinophils Relative: 1 %
HCT: 39.3 % (ref 39.0–52.0)
Hemoglobin: 12.9 g/dL — ABNORMAL LOW (ref 13.0–17.0)
Immature Granulocytes: 0 %
Lymphocytes Relative: 30 %
Lymphs Abs: 2.2 10*3/uL (ref 0.7–4.0)
MCH: 31.2 pg (ref 26.0–34.0)
MCHC: 32.8 g/dL (ref 30.0–36.0)
MCV: 94.9 fL (ref 80.0–100.0)
Monocytes Absolute: 0.7 10*3/uL (ref 0.1–1.0)
Monocytes Relative: 9 %
Neutro Abs: 4.3 10*3/uL (ref 1.7–7.7)
Neutrophils Relative %: 59 %
Platelets: 247 10*3/uL (ref 150–400)
RBC: 4.14 MIL/uL — ABNORMAL LOW (ref 4.22–5.81)
RDW: 13.7 % (ref 11.5–15.5)
WBC: 7.3 10*3/uL (ref 4.0–10.5)
nRBC: 0 % (ref 0.0–0.2)

## 2021-10-05 LAB — COMPREHENSIVE METABOLIC PANEL
ALT: 28 U/L (ref 0–44)
AST: 17 U/L (ref 15–41)
Albumin: 3.6 g/dL (ref 3.5–5.0)
Alkaline Phosphatase: 94 U/L (ref 38–126)
Anion gap: 6 (ref 5–15)
BUN: 24 mg/dL — ABNORMAL HIGH (ref 8–23)
CO2: 24 mmol/L (ref 22–32)
Calcium: 9.3 mg/dL (ref 8.9–10.3)
Chloride: 109 mmol/L (ref 98–111)
Creatinine, Ser: 1.4 mg/dL — ABNORMAL HIGH (ref 0.61–1.24)
GFR, Estimated: 55 mL/min — ABNORMAL LOW (ref >60)
Glucose, Bld: 164 mg/dL — ABNORMAL HIGH (ref 70–99)
Potassium: 4.3 mmol/L (ref 3.5–5.1)
Sodium: 139 mmol/L (ref 135–145)
Total Bilirubin: 0.3 mg/dL (ref 0.3–1.2)
Total Protein: 7.8 g/dL (ref 6.5–8.1)

## 2021-10-07 LAB — KAPPA/LAMBDA LIGHT CHAINS
Kappa free light chain: 30.9 mg/L — ABNORMAL HIGH (ref 3.3–19.4)
Kappa, lambda light chain ratio: 0.57 (ref 0.26–1.65)
Lambda free light chains: 54.1 mg/L — ABNORMAL HIGH (ref 5.7–26.3)

## 2021-10-10 LAB — PROTEIN ELECTROPHORESIS, SERUM
A/G Ratio: 1 (ref 0.7–1.7)
Albumin ELP: 3.5 g/dL (ref 2.9–4.4)
Alpha-1-Globulin: 0.2 g/dL (ref 0.0–0.4)
Alpha-2-Globulin: 0.7 g/dL (ref 0.4–1.0)
Beta Globulin: 1.1 g/dL (ref 0.7–1.3)
Gamma Globulin: 1.7 g/dL (ref 0.4–1.8)
Globulin, Total: 3.6 g/dL (ref 2.2–3.9)
M-Spike, %: 1.1 g/dL — ABNORMAL HIGH
Total Protein ELP: 7.1 g/dL (ref 6.0–8.5)

## 2021-10-12 ENCOUNTER — Other Ambulatory Visit (HOSPITAL_COMMUNITY): Payer: Medicare HMO

## 2021-10-19 ENCOUNTER — Inpatient Hospital Stay (HOSPITAL_COMMUNITY): Payer: Medicare HMO | Admitting: Hematology

## 2021-10-19 ENCOUNTER — Other Ambulatory Visit (HOSPITAL_COMMUNITY): Payer: Self-pay | Admitting: *Deleted

## 2021-10-19 VITALS — BP 125/86 | HR 77 | Temp 98.8°F | Resp 18 | Ht 67.0 in | Wt 208.3 lb

## 2021-10-19 DIAGNOSIS — D472 Monoclonal gammopathy: Secondary | ICD-10-CM | POA: Diagnosis not present

## 2021-10-19 DIAGNOSIS — N189 Chronic kidney disease, unspecified: Secondary | ICD-10-CM | POA: Diagnosis not present

## 2021-10-19 DIAGNOSIS — E1122 Type 2 diabetes mellitus with diabetic chronic kidney disease: Secondary | ICD-10-CM | POA: Diagnosis not present

## 2021-10-19 DIAGNOSIS — Z809 Family history of malignant neoplasm, unspecified: Secondary | ICD-10-CM | POA: Diagnosis not present

## 2021-10-19 DIAGNOSIS — I129 Hypertensive chronic kidney disease with stage 1 through stage 4 chronic kidney disease, or unspecified chronic kidney disease: Secondary | ICD-10-CM | POA: Diagnosis not present

## 2021-10-19 DIAGNOSIS — Z87891 Personal history of nicotine dependence: Secondary | ICD-10-CM | POA: Diagnosis not present

## 2021-10-19 DIAGNOSIS — C9000 Multiple myeloma not having achieved remission: Secondary | ICD-10-CM | POA: Diagnosis not present

## 2021-10-19 NOTE — Patient Instructions (Signed)
[place] at [facility] Discharge Instructions   You were seen and examined today by Dr. Delton Coombes.  He reviewed the results of your lab work and x-ray. All results were normal/stable.  Return as scheduled in 6 months.   Thank you for choosing [place] at [facility] to provide your oncology and hematology care.  To afford each patient quality time with our provider, please arrive at least 15 minutes before your scheduled appointment time.   If you have a lab appointment with the [facility] please come in thru the Main Entrance and check in at the main information desk.  You need to re-schedule your appointment should you arrive 10 or more minutes late.  We strive to give you quality time with our providers, and arriving late affects you and other patients whose appointments are after yours.  Also, if you no show three or more times for appointments you may be dismissed from the clinic at the providers discretion.     Again, thank you for choosing [facility].  Our hope is that these requests will decrease the amount of time that you wait before being seen by our physicians.       _____________________________________________________________  Should you have questions after your visit to [facility], please contact our office at [phone] and follow the prompts.  Our office hours are 8:00 a.m. and 4:30 p.m. Monday - Friday.  Please note that voicemails left after 4:00 p.m. may not be returned until the following business day.  We are closed weekends and major holidays.  You do have access to a nurse 24-7, just call the main number to the clinic [phone] and do not press any options, hold on the line and a nurse will answer the phone.    For prescription refill requests, have your pharmacy contact our office and allow 72 hours.    Due to Covid, you will need to wear a mask upon entering the hospital. If you  do not have a mask, a mask will be given to you at the Main Entrance upon arrival. For doctor visits, patients may have 1 support person age 53 or older with them. For treatment visits, patients can not have anyone with them due to social distancing guidelines and our immunocompromised population.

## 2021-10-19 NOTE — Progress Notes (Signed)
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, a 71 y.o. male, returns for routine follow-up for his smoldering IgG lambda multiple myeloma. Corey Murphy was last seen on 04/13/2021.  Today he reports feeling good. He denies new pain or bone pains. He reports he had a boil under his right arm for which he took antibiotics and resolved within a week. He otherwise denies infections since his last visit. He denies numbness/tingling.   REVIEW OF SYSTEMS:  Review of Systems  Constitutional:  Negative for appetite change and fatigue.  Neurological:  Negative for numbness.  Psychiatric/Behavioral:  Positive for sleep disturbance.   All other systems reviewed and are negative.   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  Diminutive adenomatous polyp in the mid-right colon, otherwise normal   COLONOSCOPY N/A 06/19/2012   Procedure: COLONOSCOPY;  Surgeon: Daneil Dolin, MD;  [facility];  Service: Endoscopy;  Laterality: N/A;  8:30AM-rescheduled 10:30am Darius Bump notified pt   COLONOSCOPY N/A  08/08/2017   Surgeon: Daneil Dolin, MD;  diverticulosis in sigmoid, descending, and transverse colon, three 4-7 mm polyps in ascending colon, and one 9 mm polyp in ascending colon. Pathology with tubular adenomas. Repeat in 3 years.   COLONOSCOPY N/A 04/23/2019   Sigmoid and descending colon diverticulosis, one 5 mm polyp (tubular adenoma) in descending colon, non-bleeding internal hemorrhoids. 5 year surveillance.    Ileocolonoscopy  06/28/2007   VZD:GLOVFI rectum/Submucosal sigmoid diverticula (chronic), doubt clinical significance Hyperplastic polypoid-appearing fold, mid ascending colon, status/Remainder colonic mucosa and terminal ileum mucosa appeared normal    LEFT AND RIGHT HEART CATHETERIZATION WITH CORONARY ANGIOGRAM  03/19/2013   Procedure: LEFT AND RIGHT HEART CATHETERIZATION WITH CORONARY ANGIOGRAM;  Surgeon: Wellington Hampshire, MD;  Location: [facility];  Service: Cardiovascular;;   POLYPECTOMY  08/08/2017   Procedure: POLYPECTOMY;  Surgeon: Daneil Dolin, MD;  [facility];  Service: Endoscopy;;  ascending colon polyps x4 (cold snare-3,hot snare)   TOTAL HIP ARTHROPLASTY  2010   Left   TOTAL HIP ARTHROPLASTY  2010   Right    SOCIAL HISTORY:  Social History   Socioeconomic History   Marital status: Married    Spouse name: Not on file   Number of children: 2   Years of education: Not on file   Highest education level: Not on file  Occupational History   Occupation: Retired, Psychologist, educational  Tobacco Use   Smoking status: Former    Packs/day: 1.00    Years: 12.00    Total  pack years: 12.00    Types: Cigarettes    Quit date: 05/28/2008    Years since quitting: 13.4   Smokeless tobacco: Never  Vaping Use   Vaping Use: Never used  Substance and Sexual Activity   Alcohol use: Yes    Comment: Occasionally 2 to 3 times a month   Drug use: Not Currently    Types: Marijuana    Comment: Smoked via a bowl occassionally.    Sexual activity: Not on file  Other  Topics Concern   Not on file  Social History Narrative   Not on file   Social Determinants of Health   Financial Resource Strain: Not on file  Food Insecurity: No Food Insecurity (04/14/2020)   Hunger Vital Sign    Worried About Running Out of Food in the Last Year: Never true    Ran Out of Food in the Last Year: Never true  Transportation Needs: No Transportation Needs (04/14/2020)   PRAPARE - Transportation    Lack of Transportation (Medical): No    Lack of Transportation (Non-Medical): No  Physical Activity: Inactive (04/14/2020)   Exercise Vital Sign    Days of Exercise per Week: 0 days    Minutes of Exercise per Session: 0 min  Stress: No Stress Concern Present (04/14/2020)   [employer] - Occupational Stress Questionnaire    Feeling of Stress : Not at all  Social Connections: Moderately Integrated (04/14/2020)   Social Connection and Isolation Panel [NHANES]    Frequency of Communication with Friends and Family: More than three times a week    Frequency of Social Gatherings with Friends and Family: Three times a week    Attends Religious Services: 1 to 4 times per year    Active Member of Clubs or Organizations: No    Attends Club or Organization Meetings: Never    Marital Status: Married  Intimate Partner Violence: Not At Risk (04/14/2020)   Humiliation, Afraid, Rape, and Kick questionnaire    Fear of Current or Ex-Partner: No    Emotionally Abused: No    Physically Abused: No    Sexually Abused: No    FAMILY HISTORY:  Family History  Problem Relation Age of Onset   Heart failure Mother    Diabetes Mother    Hypertension Mother    Heart failure Sister    Hypertension Sister    Cancer Sister    Colon cancer Neg Hx     CURRENT MEDICATIONS:  Current Outpatient Medications  Medication Sig Dispense Refill   aspirin 81 MG tablet Take 81 mg by mouth daily.     brimonidine (ALPHAGAN) 0.2 % ophthalmic solution Place 1 drop into the  right eye 2 (two) times daily.     carvedilol (COREG) 25 MG tablet Take 1 tablet (25 mg total) by mouth 2 (two) times daily. 180 tablet 3   doxycycline (VIBRA-TABS) 100 MG tablet Take 1 tablet (100 mg total) by mouth 2 (two) times daily. 20 tablet 0   furosemide (LASIX) 20 MG tablet Take 1 tablet (20 mg total) by mouth as needed (For weight gain of 2-3 LBS overnight or 5 LBS in one week). 30 tablet 11   glimepiride (AMARYL) 4 MG tablet Take 4 mg by mouth 2 (two) times daily.     HYDROcodone-acetaminophen (NORCO) 10-325 MG tablet Take 1 tablet by mouth every 6 (six) hours as needed for moderate pain.     JANUVIA 50 MG tablet Take 50 mg by mouth   daily.     lisinopril (ZESTRIL) 5 MG tablet Take 1 tablet (5 mg total) by mouth 2 (two) times daily. 180 tablet 3   Multiple Vitamins-Iron (MULTIVITAMIN/IRON PO) Take 1 tablet by mouth daily.      ONETOUCH ULTRA test strip 1 each 2 (two) times daily.     sildenafil (REVATIO) 20 MG tablet      simvastatin (ZOCOR) 20 MG tablet Take 20 mg by mouth daily.   3   spironolactone (ALDACTONE) 25 MG tablet Take 0.5 tablets (12.5 mg total) by mouth daily. 45 tablet 3   traZODone (DESYREL) 50 MG tablet Take 50 mg by mouth at bedtime.     zolpidem (AMBIEN) 10 MG tablet Take 10 mg by mouth at bedtime.     No current facility-administered medications for this visit.    ALLERGIES:  Allergies  Allergen Reactions   Other    Peanut-Containing Drug Products Rash    *Cashews    PHYSICAL EXAM:  Performance status (ECOG): 1 - Symptomatic but completely ambulatory  There were no vitals filed for this visit. Wt Readings from Last 3 Encounters:  08/01/21 204 lb (92.5 kg)  05/30/21 217 lb 6.4 oz (98.6 kg)  04/13/21 211 lb 3.2 oz (95.8 kg)   Physical Exam Vitals reviewed.  Constitutional:      Appearance: Normal appearance. He is obese.  Cardiovascular:     Rate and Rhythm: Normal rate and regular rhythm.     Pulses: Normal pulses.     Heart sounds: Normal heart  sounds.  Pulmonary:     Effort: Pulmonary effort is normal.     Breath sounds: Normal breath sounds.  Neurological:     General: No focal deficit present.     Mental Status: He is alert and oriented to person, place, and time.  Psychiatric:        Mood and Affect: Mood normal.        Behavior: Behavior normal.     LABORATORY DATA:  I have reviewed the labs as listed.     Latest Ref Rng & Units 10/05/2021   12:27 PM 04/06/2021    2:17 PM 10/07/2020    1:40 PM  CBC  WBC 4.0 - 10.5 K/uL 7.3  4.7  5.6   Hemoglobin 13.0 - 17.0 g/dL 12.9  13.7  13.9   Hematocrit 39.0 - 52.0 % 39.3  40.2  42.7   Platelets 150 - 400 K/uL 247  249  304       Latest Ref Rng & Units 10/05/2021   12:27 PM 08/16/2021    1:13 PM 08/01/2021    1:28 PM  CMP  Glucose 70 - 99 mg/dL 164  195  187   BUN 8 - 23 mg/dL 24  27  45   Creatinine 0.61 - 1.24 mg/dL 1.40  1.55  1.84   Sodium 135 - 145 mmol/L 139  135  138   Potassium 3.5 - 5.1 mmol/L 4.3  4.5  4.3   Chloride 98 - 111 mmol/L 109  104  103   CO2 22 - 32 mmol/L _0 Calcium 8.9 - 10.3 mg/dL 9.3  8.9  9.2   Total Protein 6.5 - 8.1 g/dL 7.8     Total Bilirubin 0.3 - 1.2 mg/dL 0.3     Alkaline Phos 38 - 126 U/L 94     AST 15 - 41 U/L 17     ALT 0 - 44 U/L  28         Component Value Date/Time   RBC 4.14 (L) 10/05/2021 1227   MCV 94.9 10/05/2021 1227   MCH 31.2 10/05/2021 1227   MCHC 32.8 10/05/2021 1227   RDW 13.7 10/05/2021 1227   LYMPHSABS 2.2 10/05/2021 1227   MONOABS 0.7 10/05/2021 1227   EOSABS 0.1 10/05/2021 1227   BASOSABS 0.0 10/05/2021 1227    DIAGNOSTIC IMAGING:  I have independently reviewed the scans and discussed with the patient. DG Bone Survey Met  Result Date: 10/06/2021 CLINICAL DATA:  Myeloma. EXAM: METASTATIC BONE SURVEY COMPARISON:  Bone survey 06/01/2020. FINDINGS: No acute fractures.  No focal osseous lesions. Bilateral hip arthroplasties appear uncomplicated. Peripheral vascular calcifications are present. IMPRESSION:  1. No focal osseous lesions. Electronically Signed   By: Amy  Guttmann M.D.   On: 10/06/2021 16:02     ASSESSMENT:  1.  IgG lambda smoldering multiple myeloma: -BM BX on 07/08/2015 shows normocellular marrow with monoclonal plasmacytosis, 10-20%, 46, XY, myeloma FISH panel negative -Skeletal survey on 06/04/2019 was negative for lytic lesions.   2.  CKD: -Baseline creatinine between 1.4-1.6.   PLAN:  1.  IgG lambda smoldering multiple myeloma: -He does not have any new onset bone pains.  He had one right axillary boil which healed. - No other infections reported. - Reviewed labs from 10/05/2021 which showed creatinine 1.4, calcium normal.  M spike is stable at 1.1 g.  Hemoglobin is 12.9. - Free lambda light chains of 54.1 and ratio is 0.57. - Skeletal survey on 10/05/2021 did not reveal any lytic lesions. - Recommend follow-up in 6 months with myeloma repeat labs.   2.  CKD: -Baseline creatinine stable between 1.3-1.5.  Today creatinine is 1.4.  Orders placed this encounter:  No orders of the defined types were placed in this encounter.    Sreedhar Katragadda, MD [facility] [phone]   I, Kirstyn Evans, am acting as a scribe for Dr. Sreedhar Katragadda.  I, Sreedhar Katragadda MD, have reviewed the above documentation for accuracy and completeness, and I agree with the above.     

## 2022-01-19 DIAGNOSIS — H43813 Vitreous degeneration, bilateral: Secondary | ICD-10-CM | POA: Diagnosis not present

## 2022-01-23 DIAGNOSIS — I509 Heart failure, unspecified: Secondary | ICD-10-CM | POA: Diagnosis not present

## 2022-01-23 DIAGNOSIS — Z6830 Body mass index (BMI) 30.0-30.9, adult: Secondary | ICD-10-CM | POA: Diagnosis not present

## 2022-01-23 DIAGNOSIS — E6609 Other obesity due to excess calories: Secondary | ICD-10-CM | POA: Diagnosis not present

## 2022-01-23 DIAGNOSIS — N183 Chronic kidney disease, stage 3 unspecified: Secondary | ICD-10-CM | POA: Diagnosis not present

## 2022-01-23 DIAGNOSIS — C9001 Multiple myeloma in remission: Secondary | ICD-10-CM | POA: Diagnosis not present

## 2022-01-23 DIAGNOSIS — I429 Cardiomyopathy, unspecified: Secondary | ICD-10-CM | POA: Diagnosis not present

## 2022-01-23 DIAGNOSIS — N5201 Erectile dysfunction due to arterial insufficiency: Secondary | ICD-10-CM | POA: Diagnosis not present

## 2022-01-23 DIAGNOSIS — E1122 Type 2 diabetes mellitus with diabetic chronic kidney disease: Secondary | ICD-10-CM | POA: Diagnosis not present

## 2022-02-07 DIAGNOSIS — H04123 Dry eye syndrome of bilateral lacrimal glands: Secondary | ICD-10-CM | POA: Diagnosis not present

## 2022-02-27 ENCOUNTER — Encounter (INDEPENDENT_AMBULATORY_CARE_PROVIDER_SITE_OTHER): Payer: Medicare HMO | Admitting: Ophthalmology

## 2022-03-09 ENCOUNTER — Encounter (INDEPENDENT_AMBULATORY_CARE_PROVIDER_SITE_OTHER): Payer: Medicare HMO | Admitting: Ophthalmology

## 2022-03-09 DIAGNOSIS — E113291 Type 2 diabetes mellitus with mild nonproliferative diabetic retinopathy without macular edema, right eye: Secondary | ICD-10-CM

## 2022-03-09 DIAGNOSIS — I10 Essential (primary) hypertension: Secondary | ICD-10-CM | POA: Diagnosis not present

## 2022-03-09 DIAGNOSIS — H35033 Hypertensive retinopathy, bilateral: Secondary | ICD-10-CM | POA: Diagnosis not present

## 2022-03-09 DIAGNOSIS — H35373 Puckering of macula, bilateral: Secondary | ICD-10-CM

## 2022-03-09 DIAGNOSIS — H43813 Vitreous degeneration, bilateral: Secondary | ICD-10-CM | POA: Diagnosis not present

## 2022-03-10 ENCOUNTER — Encounter: Payer: Self-pay | Admitting: Cardiology

## 2022-03-10 ENCOUNTER — Ambulatory Visit: Payer: Medicare HMO | Attending: Cardiology | Admitting: Cardiology

## 2022-03-10 VITALS — BP 118/68 | HR 68 | Ht 67.0 in | Wt 203.0 lb

## 2022-03-10 DIAGNOSIS — Z8679 Personal history of other diseases of the circulatory system: Secondary | ICD-10-CM

## 2022-03-10 DIAGNOSIS — E782 Mixed hyperlipidemia: Secondary | ICD-10-CM

## 2022-03-10 DIAGNOSIS — I10 Essential (primary) hypertension: Secondary | ICD-10-CM

## 2022-03-10 MED ORDER — CARVEDILOL 12.5 MG PO TABS: 12.5000 mg | ORAL_TABLET | Freq: Two times a day (BID) | ORAL | 3 refills | Status: DC

## 2022-03-10 NOTE — Progress Notes (Signed)
Cardiology Office Note  Date: 03/10/2022   ID: Corey Murphy, DOB May 26, 1950, MRN [id number]  PCP:  Redmond School, MD  Cardiologist:  Rozann Lesches, MD Electrophysiologist:  None   Chief Complaint  Patient presents with   Cardiac follow-up    History of Present Illness: Corey Murphy is a 71 y.o. male last seen in April by [name], I reviewed the note.  He is here today with his wife for a follow-up visit.  Reports NYHA class I-II dyspnea, no angina, no palpitations or syncope.  He does mention that his blood pressure had actually gotten too low and he cut his Coreg back to 25 mg once a day.  We discussed altering this to 12.5 mg twice daily.  Otherwise has been on stable cardiac regimen.  I personally reviewed his ECG today which shows sinus rhythm with prolonged PR interval.  His last echocardiogram was in 2020 at which point LVEF was 55 to 60%.  He has a history of nonischemic cardiomyopathy with improvement in LVEF on medical therapy.  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  Diminutive adenomatous polyp in the mid-right colon, otherwise normal   COLONOSCOPY N/A 06/19/2012   Procedure: COLONOSCOPY;  Surgeon: Daneil Dolin, MD;  [facility];  Service: Endoscopy;  Laterality: N/A;  8:30AM-rescheduled 10:30am Darius Bump notified pt   COLONOSCOPY N/A 08/08/2017   Surgeon: Daneil Dolin, MD;   diverticulosis in sigmoid, descending, and transverse colon, three 4-7 mm polyps in ascending colon, and one 9 mm polyp in ascending colon. Pathology with tubular adenomas. Repeat in 3 years.   COLONOSCOPY N/A 04/23/2019   Sigmoid and descending colon diverticulosis, one 5 mm polyp (tubular adenoma) in descending colon, non-bleeding internal hemorrhoids. 5 year surveillance.    Ileocolonoscopy  06/28/2007   WUJ:[id number] rectum/Submucosal sigmoid diverticula (chronic), doubt clinical significance Hyperplastic polypoid-appearing fold, mid ascending colon, status/Remainder colonic mucosa and terminal ileum mucosa appeared normal    LEFT AND RIGHT HEART CATHETERIZATION WITH CORONARY ANGIOGRAM  03/19/2013   Procedure: LEFT AND RIGHT HEART CATHETERIZATION WITH CORONARY ANGIOGRAM;  Surgeon: Wellington Hampshire, MD;  Location: [facility];  Service: Cardiovascular;;   POLYPECTOMY  08/08/2017   Procedure: POLYPECTOMY;  Surgeon: Daneil Dolin, MD;  [facility];  Service: Endoscopy;;  ascending colon polyps x4 (cold snare-3,hot snare)   TOTAL HIP ARTHROPLASTY  2010   Left   TOTAL HIP ARTHROPLASTY  2010   Right    Current Outpatient Medications  Medication Sig Dispense Refill   aspirin 81 MG tablet Take 81 mg by mouth daily.     carvedilol (COREG) 12.5 MG tablet Take 1 tablet (12.5 mg total) by mouth 2 (two) times daily. 180 tablet 3   glimepiride (AMARYL) 4 MG tablet Take 4 mg by mouth 2 (two) times daily.  lisinopril (ZESTRIL) 5 MG tablet Take 1 tablet (5 mg total) by mouth 2 (two) times daily. 180 tablet 3   Multiple Vitamins-Iron (MULTIVITAMIN/IRON PO) Take 1 tablet by mouth daily.      ONETOUCH ULTRA test strip 1 each 2 (two) times daily.     simvastatin (ZOCOR) 20 MG tablet Take 20 mg by mouth daily.   3   spironolactone (ALDACTONE) 25 MG tablet Take 0.5 tablets (12.5 mg total) by mouth daily. 45 tablet 3   traZODone (DESYREL) 50 MG tablet Take 50 mg by mouth at bedtime.     VISINE  0.05 % ophthalmic solution SMARTSIG:1 Drop(s) In Eye(s) PRN     furosemide (LASIX) 20 MG tablet Take 1 tablet (20 mg total) by mouth as needed (For weight gain of 2-3 LBS overnight or 5 LBS in one week). (Patient not taking: Reported on 03/10/2022) 30 tablet 11   No current facility-administered medications for this visit.   Allergies:  Other and Peanut-containing drug products   ROS: No orthopnea or PND.  Physical Exam: VS:  BP 118/68   Pulse 68   Ht '5\' 7"'$  (1.702 m)   Wt 203 lb (92.1 kg)   SpO2 99%   BMI 31.79 kg/m , BMI Body mass index is 31.79 kg/m.  Wt Readings from Last 3 Encounters:  03/10/22 203 lb (92.1 kg)  10/19/21 208 lb 4.8 oz (94.5 kg)  08/01/21 204 lb (92.5 kg)    General: Patient appears comfortable at rest. HEENT: Conjunctiva and lids normal. Neck: Supple, no elevated JVP or carotid bruits. Lungs: Clear to auscultation, nonlabored breathing at rest. Cardiac: Regular rate and rhythm, no S3 or significant systolic murmur, no pericardial rub. Extremities: No pitting edema.  ECG:  An ECG dated 01/06/2021 was personally reviewed today and demonstrated:  Sinus rhythm with prolonged PR interval.  Recent Labwork: January 2023: Cholesterol 161, triglycerides 191, HDL 60, LDL 70 10/05/2021: ALT 28; AST 17; BUN 24; Creatinine, Ser 1.40; Hemoglobin 12.9; Platelets 247; Potassium 4.3; Sodium 139   Other Studies Reviewed Today:  Echocardiogram 09/11/2018:  1. The left ventricle has normal systolic function, with an ejection  fraction of 55-60%. The cavity size was normal. There is moderate  concentric left ventricular hypertrophy. Left ventricular diastolic  Doppler parameters are consistent with impaired  relaxation. Indeterminate filling pressures No evidence of left  ventricular regional wall motion abnormalities.   2. The right ventricle has normal systolic function. The cavity was  normal. There is no increase in right ventricular wall thickness.   3. The mitral  valve is grossly normal.   4. The tricuspid valve is grossly normal.   5. The aortic valve is tricuspid.   6. The aortic root is normal in size and structure.   Assessment and Plan:  1.  HFrecEF with history of nonischemic cardiomyopathy.  LVEF 55 to 60% by echocardiogram in 2020.  We will obtain an updated study for reassessment.  Change Coreg to 12.5 mg twice daily, continue lisinopril and Aldactone at current doses.  I reviewed his lab work from June.  2.  Essential hypertension, blood pressure is very well controlled today.  3.  Mixed hyperlipidemia, on Zocor.  His last LDL was 70.  Medication Adjustments/Labs and Tests Ordered: Current medicines are reviewed at length with the patient today.  Concerns regarding medicines are outlined above.   Tests Ordered: Orders Placed This Encounter  Procedures   EKG 12-Lead   ECHOCARDIOGRAM COMPLETE    Medication Changes: Meds  ordered this encounter  Medications   carvedilol (COREG) 12.5 MG tablet    Sig: Take 1 tablet (12.5 mg total) by mouth 2 (two) times daily.    Dispense:  180 tablet    Refill:  3    03/10/22 pt was taking 25 mg only once a day    Disposition:  Follow up  6 months.  Signed, Satira Sark, MD, [facility] 03/10/2022 3:06 PM    [facility] [address], [place], [place] [address] Phone: [phone]; Fax: [phone]

## 2022-03-10 NOTE — Patient Instructions (Signed)
Medication Instructions:  STOP Coreg 25 mg   START Coreg 12.5 mg TWICE a day  Labwork: None today  Testing/Procedures: Your physician has requested that you have an echocardiogram. Echocardiography is a painless test that uses sound waves to create images of your heart. It provides your doctor with information about the size and shape of your heart and how well your heart's chambers and valves are working. This procedure takes approximately one hour. There are no restrictions for this procedure. Please do NOT wear cologne, perfume, aftershave, or lotions (deodorant is allowed). Please arrive 15 minutes prior to your appointment time.   Follow-Up:  6 months  Any Other Special Instructions Will Be Listed Below (If Applicable).  If you need a refill on your cardiac medications before your next appointment, please call your pharmacy.

## 2022-03-21 ENCOUNTER — Ambulatory Visit (HOSPITAL_COMMUNITY)
Admission: RE | Admit: 2022-03-21 | Discharge: 2022-03-21 | Disposition: A | Discharge status: Home / Self Care | Payer: Medicare HMO | Source: Ambulatory Visit | Attending: Cardiology | Admitting: Cardiology

## 2022-03-21 DIAGNOSIS — I428 Other cardiomyopathies: Secondary | ICD-10-CM

## 2022-03-21 DIAGNOSIS — I130 Hypertensive heart and chronic kidney disease with heart failure and stage 1 through stage 4 chronic kidney disease, or unspecified chronic kidney disease: Secondary | ICD-10-CM | POA: Diagnosis not present

## 2022-03-21 DIAGNOSIS — E1122 Type 2 diabetes mellitus with diabetic chronic kidney disease: Secondary | ICD-10-CM | POA: Diagnosis not present

## 2022-03-21 DIAGNOSIS — N189 Chronic kidney disease, unspecified: Secondary | ICD-10-CM | POA: Diagnosis not present

## 2022-03-21 DIAGNOSIS — F1020 Alcohol dependence, uncomplicated: Secondary | ICD-10-CM | POA: Insufficient documentation

## 2022-03-21 DIAGNOSIS — Z8679 Personal history of other diseases of the circulatory system: Secondary | ICD-10-CM | POA: Insufficient documentation

## 2022-03-21 DIAGNOSIS — E785 Hyperlipidemia, unspecified: Secondary | ICD-10-CM | POA: Insufficient documentation

## 2022-03-21 DIAGNOSIS — I340 Nonrheumatic mitral (valve) insufficiency: Secondary | ICD-10-CM | POA: Diagnosis not present

## 2022-03-21 DIAGNOSIS — I5041 Acute combined systolic (congestive) and diastolic (congestive) heart failure: Secondary | ICD-10-CM | POA: Diagnosis not present

## 2022-03-21 DIAGNOSIS — I429 Cardiomyopathy, unspecified: Secondary | ICD-10-CM | POA: Insufficient documentation

## 2022-03-21 DIAGNOSIS — R69 Illness, unspecified: Secondary | ICD-10-CM | POA: Diagnosis not present

## 2022-03-21 LAB — ECHOCARDIOGRAM COMPLETE
Area-P 1/2: 6.71 cm2
S' Lateral: 3.1 cm

## 2022-03-21 NOTE — Progress Notes (Signed)
*  PRELIMINARY RESULTS* Echocardiogram 2D Echocardiogram has been performed.  Samuel Germany 03/21/2022, 1:52 PM

## 2022-04-02 ENCOUNTER — Other Ambulatory Visit: Payer: Self-pay | Admitting: Cardiology

## 2022-04-13 ENCOUNTER — Inpatient Hospital Stay: Payer: Medicare HMO | Attending: Hematology

## 2022-04-13 DIAGNOSIS — C9000 Multiple myeloma not having achieved remission: Secondary | ICD-10-CM | POA: Insufficient documentation

## 2022-04-13 DIAGNOSIS — I129 Hypertensive chronic kidney disease with stage 1 through stage 4 chronic kidney disease, or unspecified chronic kidney disease: Secondary | ICD-10-CM | POA: Diagnosis not present

## 2022-04-13 DIAGNOSIS — N189 Chronic kidney disease, unspecified: Secondary | ICD-10-CM | POA: Diagnosis not present

## 2022-04-13 DIAGNOSIS — Z87891 Personal history of nicotine dependence: Secondary | ICD-10-CM | POA: Insufficient documentation

## 2022-04-13 DIAGNOSIS — D472 Monoclonal gammopathy: Secondary | ICD-10-CM

## 2022-04-13 DIAGNOSIS — E1122 Type 2 diabetes mellitus with diabetic chronic kidney disease: Secondary | ICD-10-CM | POA: Diagnosis not present

## 2022-04-13 LAB — COMPREHENSIVE METABOLIC PANEL
ALT: 26 U/L (ref 0–44)
AST: 16 U/L (ref 15–41)
Albumin: 3.5 g/dL (ref 3.5–5.0)
Alkaline Phosphatase: 88 U/L (ref 38–126)
Anion gap: 5 (ref 5–15)
BUN: 19 mg/dL (ref 8–23)
CO2: 24 mmol/L (ref 22–32)
Calcium: 8.8 mg/dL — ABNORMAL LOW (ref 8.9–10.3)
Chloride: 108 mmol/L (ref 98–111)
Creatinine, Ser: 1.38 mg/dL — ABNORMAL HIGH (ref 0.61–1.24)
GFR, Estimated: 55 mL/min — ABNORMAL LOW (ref >60)
Glucose, Bld: 150 mg/dL — ABNORMAL HIGH (ref 70–99)
Potassium: 4.4 mmol/L (ref 3.5–5.1)
Sodium: 137 mmol/L (ref 135–145)
Total Bilirubin: 0.3 mg/dL (ref 0.3–1.2)
Total Protein: 7.9 g/dL (ref 6.5–8.1)

## 2022-04-13 LAB — CBC WITH DIFFERENTIAL/PLATELET
Abs Immature Granulocytes: 0.01 10*3/uL (ref 0.00–0.07)
Basophils Absolute: 0.1 10*3/uL (ref 0.0–0.1)
Basophils Relative: 1 %
Eosinophils Absolute: 0.1 10*3/uL (ref 0.0–0.5)
Eosinophils Relative: 1 %
HCT: 41.4 % (ref 39.0–52.0)
Hemoglobin: 13.5 g/dL (ref 13.0–17.0)
Immature Granulocytes: 0 %
Lymphocytes Relative: 43 %
Lymphs Abs: 2.1 10*3/uL (ref 0.7–4.0)
MCH: 30.8 pg (ref 26.0–34.0)
MCHC: 32.6 g/dL (ref 30.0–36.0)
MCV: 94.5 fL (ref 80.0–100.0)
Monocytes Absolute: 0.5 10*3/uL (ref 0.1–1.0)
Monocytes Relative: 10 %
Neutro Abs: 2.3 10*3/uL (ref 1.7–7.7)
Neutrophils Relative %: 45 %
Platelets: 254 10*3/uL (ref 150–400)
RBC: 4.38 MIL/uL (ref 4.22–5.81)
RDW: 14.5 % (ref 11.5–15.5)
WBC: 5 10*3/uL (ref 4.0–10.5)
nRBC: 0 % (ref 0.0–0.2)

## 2022-04-18 LAB — KAPPA/LAMBDA LIGHT CHAINS
Kappa free light chain: 25.3 mg/L — ABNORMAL HIGH (ref 3.3–19.4)
Kappa, lambda light chain ratio: 0.46 (ref 0.26–1.65)
Lambda free light chains: 55.6 mg/L — ABNORMAL HIGH (ref 5.7–26.3)

## 2022-04-19 LAB — PROTEIN ELECTROPHORESIS, SERUM
A/G Ratio: 1 (ref 0.7–1.7)
Albumin ELP: 3.7 g/dL (ref 2.9–4.4)
Alpha-1-Globulin: 0.2 g/dL (ref 0.0–0.4)
Alpha-2-Globulin: 0.6 g/dL (ref 0.4–1.0)
Beta Globulin: 1.1 g/dL (ref 0.7–1.3)
Gamma Globulin: 1.8 g/dL (ref 0.4–1.8)
Globulin, Total: 3.7 g/dL (ref 2.2–3.9)
M-Spike, %: 1.5 g/dL — ABNORMAL HIGH
Total Protein ELP: 7.4 g/dL (ref 6.0–8.5)

## 2022-04-20 ENCOUNTER — Inpatient Hospital Stay (HOSPITAL_BASED_OUTPATIENT_CLINIC_OR_DEPARTMENT_OTHER): Payer: Medicare HMO | Admitting: Hematology

## 2022-04-20 VITALS — BP 110/62 | HR 82 | Temp 97.8°F | Resp 16 | Ht 67.0 in | Wt 203.1 lb

## 2022-04-20 DIAGNOSIS — C9000 Multiple myeloma not having achieved remission: Secondary | ICD-10-CM | POA: Diagnosis not present

## 2022-04-20 DIAGNOSIS — D472 Monoclonal gammopathy: Secondary | ICD-10-CM | POA: Diagnosis not present

## 2022-04-20 DIAGNOSIS — I129 Hypertensive chronic kidney disease with stage 1 through stage 4 chronic kidney disease, or unspecified chronic kidney disease: Secondary | ICD-10-CM | POA: Diagnosis not present

## 2022-04-20 DIAGNOSIS — E1122 Type 2 diabetes mellitus with diabetic chronic kidney disease: Secondary | ICD-10-CM | POA: Diagnosis not present

## 2022-04-20 DIAGNOSIS — N189 Chronic kidney disease, unspecified: Secondary | ICD-10-CM | POA: Diagnosis not present

## 2022-04-20 DIAGNOSIS — Z87891 Personal history of nicotine dependence: Secondary | ICD-10-CM | POA: Diagnosis not present

## 2022-04-20 NOTE — Patient Instructions (Signed)
[facility]  Discharge Instructions  You were seen and examined today by Dr. Katragadda.  Dr. Katragadda discussed your most recent lab work which revealed that everything looks good and stable.  Follow-up as scheduled in 6 months with labs and bone survey.    Thank you for choosing [facility] to provide your oncology and hematology care.   To afford each patient quality time with our provider, please arrive at least 15 minutes before your scheduled appointment time. You may need to reschedule your appointment if you arrive late (10 or more minutes). Arriving late affects you and other patients whose appointments are after yours.  Also, if you miss three or more appointments without notifying the office, you may be dismissed from the clinic at the provider's discretion.    Again, thank you for choosing [facility].  Our hope is that these requests will decrease the amount of time that you wait before being seen by our physicians.   If you have a lab appointment with the Cancer Center please come in thru the Main Entrance and check in at the main information desk.           _____________________________________________________________  Should you have questions after your visit to [facility], please contact our office at [phone] and follow the prompts.  Our office hours are 8:00 a.m. to 4:30 p.m. Monday - Thursday and 8:00 a.m. to 2:30 p.m. Friday.  Please note that voicemails left after 4:00 p.m. may not be returned until the following business day.  We are closed weekends and all major holidays.  You do have access to a nurse 24-7, just call the main number to the clinic [phone] and do not press any options, hold on the line and a nurse will answer the phone.    For prescription refill requests, have your pharmacy contact our office and allow 72 hours.    Masks are optional in the cancer centers.  If you would like for your care team to wear a mask while they are taking care of you, please let them know. You may have one support person who is at least 69 years old accompany you for your appointments.  

## 2022-04-20 NOTE — Progress Notes (Signed)
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, a 71 y.o. male, seen for follow-up of for smoldering myeloma.  Denies any new onset pains.  Denies any neuropathy.  Energy levels are 85%.  No recent infections.  REVIEW OF SYSTEMS:  Review of Systems  Constitutional:  Negative for appetite change and fatigue.  Neurological:  Negative for numbness.  All other systems reviewed and are negative.   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  Diminutive adenomatous polyp in the mid-right colon, otherwise normal   COLONOSCOPY N/A 06/19/2012   Procedure: COLONOSCOPY;  Surgeon: Corey Dolin, MD;  [facility];  Service: Endoscopy;  Laterality: N/A;  8:30AM-rescheduled 10:30am Corey Murphy notified pt   COLONOSCOPY N/A 08/08/2017   Surgeon: Corey Dolin, MD;  diverticulosis in sigmoid, descending, and transverse colon, three 4-7 mm polyps in ascending colon, and one 9 mm polyp in ascending colon. Pathology with tubular adenomas. Repeat in 3 years.   COLONOSCOPY N/A 04/23/2019   Sigmoid and  descending colon diverticulosis, one 5 mm polyp (tubular adenoma) in descending colon, non-bleeding internal hemorrhoids. 5 year surveillance.    Ileocolonoscopy  06/28/2007   [facility] rectum/Submucosal sigmoid diverticula (chronic), doubt clinical significance Hyperplastic polypoid-appearing fold, mid ascending colon, status/Remainder colonic mucosa and terminal ileum mucosa appeared normal    LEFT AND RIGHT HEART CATHETERIZATION WITH CORONARY ANGIOGRAM  03/19/2013   Procedure: LEFT AND RIGHT HEART CATHETERIZATION WITH CORONARY ANGIOGRAM;  Surgeon: Wellington Hampshire, MD;  Location: [facility];  Service: Cardiovascular;;   POLYPECTOMY  08/08/2017   Procedure: POLYPECTOMY;  Surgeon: Corey Dolin, MD;  Location: [facility];  Service: Endoscopy;;  ascending colon polyps x4 (cold snare-3,hot snare)   TOTAL HIP ARTHROPLASTY  2010   Left   TOTAL HIP ARTHROPLASTY  2010   Right    SOCIAL HISTORY:  Social History   Socioeconomic History   Marital status: Married    Spouse name: Not on file   Number of children: 2   Years of education: Not on file   Highest education level: Not on file  Occupational History   Occupation: Retired, Psychologist, educational  Tobacco Use   Smoking status: Former    Packs/day: 1.00    Years: 12.00    Total pack years: 12.00    Types: Cigarettes    Quit date: 05/28/2008    Years since quitting: 13.9   Smokeless tobacco: Never  Vaping Use   Vaping Use: Never used  Substance and Sexual Activity   Alcohol use:  Yes    Comment: Occasionally 2 to 3 times a month   Drug use: Not Currently    Types: Marijuana    Comment: Smoked via a bowl occassionally.    Sexual activity: Not on file  Other Topics Concern   Not on file  Social History Narrative   Not on file   Social Determinants of Health   Financial Resource Strain: Not on file  Food Insecurity: No Food Insecurity (04/14/2020)   Hunger Vital Sign    Worried About Running Out of Food in the Last Year: Never  true    Ran Out of Food in the Last Year: Never true  Transportation Needs: No Transportation Needs (04/14/2020)   PRAPARE - Hydrologist (Medical): No    Lack of Transportation (Non-Medical): No  Physical Activity: Inactive (04/14/2020)   Exercise Vital Sign    Days of Exercise per Week: 0 days    Minutes of Exercise per Session: 0 min  Stress: No Stress Concern Present (04/14/2020)   [place]    Feeling of Stress : Not at all  Social Connections: Moderately Integrated (04/14/2020)   Social Connection and Isolation Panel [NHANES]    Frequency of Communication with Friends and Family: More than three times a week    Frequency of Social Gatherings with Friends and Family: Three times a week    Attends Religious Services: 1 to 4 times per year    Active Member of Clubs or Organizations: No    Attends Archivist Meetings: Never    Marital Status: Married  Human resources officer Violence: Not At Risk (04/14/2020)   Humiliation, Afraid, Rape, and Kick questionnaire    Fear of Current or Ex-Partner: No    Emotionally Abused: No    Physically Abused: No    Sexually Abused: No    FAMILY HISTORY:  Family History  Problem Relation Age of Onset   Heart failure Mother    Diabetes Mother    Hypertension Mother    Heart failure Sister    Hypertension Sister    Cancer Sister    Colon cancer Neg Hx     CURRENT MEDICATIONS:  Current Outpatient Medications  Medication Sig Dispense Refill   aspirin 81 MG tablet Take 81 mg by mouth daily.     carvedilol (COREG) 12.5 MG tablet Take 1 tablet (12.5 mg total) by mouth 2 (two) times daily. 180 tablet 3   glimepiride (AMARYL) 4 MG tablet Take 4 mg by mouth 2 (two) times daily.     Multiple Vitamins-Iron (MULTIVITAMIN/IRON PO) Take 1 tablet by mouth daily.      ONETOUCH ULTRA test strip 1 each 2 (two) times daily.     simvastatin (ZOCOR) 20 MG  tablet Take 20 mg by mouth daily.   3   spironolactone (ALDACTONE) 25 MG tablet TAKE 1 TABLET (25 MG TOTAL) BY MOUTH DAILY. 90 tablet 1   traZODone (DESYREL) 50 MG tablet Take 50 mg by mouth at bedtime.     VISINE 0.05 % ophthalmic solution SMARTSIG:1 Drop(s) In Eye(s) PRN     furosemide (LASIX) 20 MG tablet Take 1 tablet (20 mg total) by mouth as needed (For weight gain of 2-3 LBS overnight or 5 LBS in one week). (Patient not taking: Reported on 03/10/2022) 30 tablet 11   lisinopril (ZESTRIL) 5 MG tablet Take 1 tablet (5 mg total) by mouth 2 (two) times daily. 180 tablet  3   No current facility-administered medications for this visit.    ALLERGIES:  Allergies  Allergen Reactions   Other    Peanut-Containing Drug Products Rash    *Cashews    PHYSICAL EXAM:  Performance status (ECOG): 1 - Symptomatic but completely ambulatory  Vitals:   04/20/22 1452  BP: 110/62  Pulse: 82  Resp: 16  Temp: 97.8 F (36.6 C)  SpO2: 99%   Wt Readings from Last 3 Encounters:  04/20/22 203 lb 1.6 oz (92.1 kg)  03/10/22 203 lb (92.1 kg)  10/19/21 208 lb 4.8 oz (94.5 kg)   Physical Exam Vitals reviewed.  Constitutional:      Appearance: Normal appearance. He is obese.  Cardiovascular:     Rate and Rhythm: Normal rate and regular rhythm.     Pulses: Normal pulses.     Heart sounds: Normal heart sounds.  Pulmonary:     Effort: Pulmonary effort is normal.     Breath sounds: Normal breath sounds.  Neurological:     General: No focal deficit present.     Mental Status: He is alert and oriented to person, place, and time.  Psychiatric:        Mood and Affect: Mood normal.        Behavior: Behavior normal.     LABORATORY DATA:  I have reviewed the labs as listed.     Latest Ref Rng & Units 04/13/2022    1:22 PM 10/05/2021   12:27 PM 04/06/2021    2:17 PM  CBC  WBC 4.0 - 10.5 K/uL 5.0  7.3  4.7   Hemoglobin 13.0 - 17.0 g/dL 13.5  12.9  13.7   Hematocrit 39.0 - 52.0 % 41.4  39.3  40.2    Platelets 150 - 400 K/uL 254  247  249       Latest Ref Rng & Units 04/13/2022    1:22 PM 10/05/2021   12:27 PM 08/16/2021    1:13 PM  CMP  Glucose 70 - 99 mg/dL 150  164  195   BUN 8 - 23 mg/dL _0 Creatinine 0.61 - 1.24 mg/dL 1.38  1.40  1.55   Sodium 135 - 145 mmol/L 137  139  135   Potassium 3.5 - 5.1 mmol/L 4.4  4.3  4.5   Chloride 98 - 111 mmol/L 108  109  104   CO2 22 - 32 mmol/L _1 Calcium 8.9 - 10.3 mg/dL 8.8  9.3  8.9   Total Protein 6.5 - 8.1 g/dL 7.9  7.8    Total Bilirubin 0.3 - 1.2 mg/dL 0.3  0.3    Alkaline Phos 38 - 126 U/L 88  94    AST 15 - 41 U/L 16  17    ALT 0 - 44 U/L 26  28        Component Value Date/Time   RBC 4.38 04/13/2022 1322   MCV 94.5 04/13/2022 1322   MCH 30.8 04/13/2022 1322   MCHC 32.6 04/13/2022 1322   RDW 14.5 04/13/2022 1322   LYMPHSABS 2.1 04/13/2022 1322   MONOABS 0.5 04/13/2022 1322   EOSABS 0.1 04/13/2022 1322   BASOSABS 0.1 04/13/2022 1322    DIAGNOSTIC IMAGING:  I have independently reviewed the scans and discussed with the patient. No results found.   ASSESSMENT:  1.  IgG lambda smoldering multiple myeloma: -BM BX on 07/08/2015 shows normocellular marrow with monoclonal plasmacytosis, 10-20%,  92, XY, myeloma FISH panel negative -Skeletal survey on 06/04/2019 was negative for lytic lesions.   2.  CKD: -Baseline creatinine between 1.4-1.6.   PLAN:  1.  IgG lambda smoldering multiple myeloma: -We reviewed labs from 04/13/2022.  M spike is 1.5 g, up from 1.1 g.  Creatinine stable at 1.38.  Calcium normal.  CBC was normal.  Free light chain ratio is 0.46 with lambda light chains of 55 stable. - I did not see any major changes in his labs.  Will see him back in 6 months with repeat myeloma labs.  Will also repeat skeletal survey.   2.  CKD: - Creatinine is 1.38.  Baseline creatinine 1.3-1.5.  Orders placed this encounter:  Orders Placed This Encounter  Procedures   DG Bone Survey Met   CBC with  Differential/Platelet   Comprehensive metabolic panel   Protein electrophoresis, serum   Kappa/lambda light chains   Lactate dehydrogenase      Derek Jack, MD [place] [phone]

## 2022-04-27 ENCOUNTER — Other Ambulatory Visit: Payer: Self-pay | Admitting: Cardiology

## 2022-05-10 DIAGNOSIS — E1122 Type 2 diabetes mellitus with diabetic chronic kidney disease: Secondary | ICD-10-CM | POA: Diagnosis not present

## 2022-05-10 DIAGNOSIS — R778 Other specified abnormalities of plasma proteins: Secondary | ICD-10-CM | POA: Diagnosis not present

## 2022-05-10 DIAGNOSIS — I129 Hypertensive chronic kidney disease with stage 1 through stage 4 chronic kidney disease, or unspecified chronic kidney disease: Secondary | ICD-10-CM | POA: Diagnosis not present

## 2022-05-10 DIAGNOSIS — N183 Chronic kidney disease, stage 3 unspecified: Secondary | ICD-10-CM | POA: Diagnosis not present

## 2022-05-22 ENCOUNTER — Other Ambulatory Visit: Payer: Self-pay | Admitting: Cardiology

## 2022-05-29 DIAGNOSIS — E669 Obesity, unspecified: Secondary | ICD-10-CM | POA: Diagnosis not present

## 2022-05-29 DIAGNOSIS — Z6831 Body mass index (BMI) 31.0-31.9, adult: Secondary | ICD-10-CM | POA: Diagnosis not present

## 2022-05-29 DIAGNOSIS — U071 COVID-19: Secondary | ICD-10-CM | POA: Diagnosis not present

## 2022-06-21 DIAGNOSIS — E1129 Type 2 diabetes mellitus with other diabetic kidney complication: Secondary | ICD-10-CM | POA: Diagnosis not present

## 2022-06-21 DIAGNOSIS — I509 Heart failure, unspecified: Secondary | ICD-10-CM | POA: Diagnosis not present

## 2022-06-21 DIAGNOSIS — E1140 Type 2 diabetes mellitus with diabetic neuropathy, unspecified: Secondary | ICD-10-CM | POA: Diagnosis not present

## 2022-06-21 DIAGNOSIS — E559 Vitamin D deficiency, unspecified: Secondary | ICD-10-CM | POA: Diagnosis not present

## 2022-06-21 DIAGNOSIS — Z125 Encounter for screening for malignant neoplasm of prostate: Secondary | ICD-10-CM | POA: Diagnosis not present

## 2022-06-21 DIAGNOSIS — D518 Other vitamin B12 deficiency anemias: Secondary | ICD-10-CM | POA: Diagnosis not present

## 2022-06-21 DIAGNOSIS — E119 Type 2 diabetes mellitus without complications: Secondary | ICD-10-CM | POA: Diagnosis not present

## 2022-06-21 DIAGNOSIS — Z1331 Encounter for screening for depression: Secondary | ICD-10-CM | POA: Diagnosis not present

## 2022-06-21 DIAGNOSIS — E1122 Type 2 diabetes mellitus with diabetic chronic kidney disease: Secondary | ICD-10-CM | POA: Diagnosis not present

## 2022-06-21 DIAGNOSIS — Z0001 Encounter for general adult medical examination with abnormal findings: Secondary | ICD-10-CM | POA: Diagnosis not present

## 2022-06-21 DIAGNOSIS — I5032 Chronic diastolic (congestive) heart failure: Secondary | ICD-10-CM | POA: Diagnosis not present

## 2022-06-21 DIAGNOSIS — N1831 Chronic kidney disease, stage 3a: Secondary | ICD-10-CM | POA: Diagnosis not present

## 2022-06-21 DIAGNOSIS — C9001 Multiple myeloma in remission: Secondary | ICD-10-CM | POA: Diagnosis not present

## 2022-06-21 DIAGNOSIS — E6609 Other obesity due to excess calories: Secondary | ICD-10-CM | POA: Diagnosis not present

## 2022-06-21 DIAGNOSIS — Z6831 Body mass index (BMI) 31.0-31.9, adult: Secondary | ICD-10-CM | POA: Diagnosis not present

## 2022-07-23 ENCOUNTER — Other Ambulatory Visit: Payer: Self-pay | Admitting: Cardiology

## 2022-09-08 ENCOUNTER — Other Ambulatory Visit: Payer: Self-pay | Admitting: Physician Assistant

## 2022-10-06 ENCOUNTER — Encounter: Payer: Self-pay | Admitting: Cardiology

## 2022-10-06 ENCOUNTER — Ambulatory Visit: Payer: Medicare HMO | Attending: Cardiology | Admitting: Cardiology

## 2022-10-06 VITALS — BP 130/84 | HR 68 | Ht 67.0 in | Wt 194.0 lb

## 2022-10-06 DIAGNOSIS — Z8679 Personal history of other diseases of the circulatory system: Secondary | ICD-10-CM | POA: Diagnosis not present

## 2022-10-06 DIAGNOSIS — E782 Mixed hyperlipidemia: Secondary | ICD-10-CM

## 2022-10-06 DIAGNOSIS — I10 Essential (primary) hypertension: Secondary | ICD-10-CM | POA: Diagnosis not present

## 2022-10-06 NOTE — Patient Instructions (Signed)
Medication Instructions:  Your physician recommends that you continue on your current medications as directed. Please refer to the Current Medication list given to you today.   Labwork: None today  Testing/Procedures: None today  Follow-Up: 6 months  Any Other Special Instructions Will Be Listed Below (If Applicable).  If you need a refill on your cardiac medications before your next appointment, please call your pharmacy.  

## 2022-10-06 NOTE — Progress Notes (Signed)
    Cardiology Office Note  Date: 10/06/2022   ID: Corey Murphy, DOB 1951/01/05, MRN [id number]  History of Present Illness: Corey Murphy is a 72 y.o. male last seen in November 2023.  He is here with his wife for a follow-up visit.  He does not report any angina, stable NYHA class I dyspnea with typical activities.  No palpitations or syncope.  He exercises at the [facility] 5 days a week.  He does not report any progressive fluid retention in fact his weight is down nearly 10 pounds over the last 6 months.  Uses Lasix only occasionally.  I reviewed the remainder of his medications which are stable from a cardiac perspective.  I also went over his lab work from February.  Physical Exam: VS:  BP 130/84   Pulse 68   Ht 5\' 7"  (1.702 m)   Wt 194 lb (88 kg)   SpO2 97%   BMI 30.38 kg/m , BMI Body mass index is 30.38 kg/m.  Wt Readings from Last 3 Encounters:  10/06/22 194 lb (88 kg)  04/20/22 203 lb 1.6 oz (92.1 kg)  03/10/22 203 lb (92.1 kg)    General: Patient appears comfortable at rest. HEENT: Conjunctiva and lids normal. Neck: Supple, no elevated JVP or carotid bruits. Lungs: Clear to auscultation, nonlabored breathing at rest. Cardiac: Regular rate and rhythm, no S3 or significant systolic murmur. Extremities: No pitting edema.  ECG:  An ECG dated 03/10/2022 was personally reviewed today and demonstrated:  Sinus rhythm with prolonged PR interval.  Labwork: 04/13/2022: ALT 26; AST 16; BUN 19; Creatinine, Ser 1.38; Hemoglobin 13.5; Platelets 254; Potassium 4.4; Sodium 137  February 2024: BUN 24, creatinine 1.62, potassium 5.1, AST 20, ALT 36, cholesterol 167, triglycerides 173, HDL 48, LDL 89, hemoglobin 14.3, platelets 207, TSH 1.07  Other Studies Reviewed Today:  Echocardiogram 03/21/2022:  1. Left ventricular ejection fraction, by estimation, is 60 to 65%. The  left ventricle has normal function. The left ventricle has no regional  wall motion abnormalities. There is moderate  left ventricular hypertrophy.  Left ventricular diastolic  parameters were normal.   2. Right ventricular systolic function is normal. The right ventricular  size is normal. Tricuspid regurgitation signal is inadequate for assessing  PA pressure.   3. The mitral valve is grossly normal. Mild mitral valve regurgitation.   4. The aortic valve is tricuspid. Aortic valve regurgitation is not  visualized.   5. The inferior vena cava is normal in size with greater than 50%  respiratory variability, suggesting right atrial pressure of 3 mmHg.   Assessment and Plan:  1.  HFrecEF with nonischemic cardiomyopathy, LVEF 60 to 65% by echocardiogram in November 2023.  He is clinically stable with NYHA class I symptoms and no evidence of fluid retention.  Continue Coreg, lisinopril, Aldactone, and as needed Lasix.  2.  Essential hypertension.  Blood pressure is adequately controlled today.  No changes were made.  3.  Mixed hyperlipidemia.  LDL 89 in February on Zocor.  4.  CKD stage IIIb.  Creatinine 1.62 with potassium 5.1 in February.  Disposition:  Follow up  6 months.  Signed, Jonelle Sidle, M.D., F.A.C.C. [facility]

## 2022-10-19 ENCOUNTER — Inpatient Hospital Stay: Payer: Medicare HMO | Attending: Hematology

## 2022-10-19 ENCOUNTER — Ambulatory Visit (HOSPITAL_COMMUNITY)
Admission: RE | Admit: 2022-10-19 | Discharge: 2022-10-19 | Disposition: A | Discharge status: Home / Self Care | Payer: Medicare HMO | Source: Ambulatory Visit | Attending: Hematology | Admitting: Hematology

## 2022-10-19 DIAGNOSIS — Z87891 Personal history of nicotine dependence: Secondary | ICD-10-CM | POA: Insufficient documentation

## 2022-10-19 DIAGNOSIS — M461 Sacroiliitis, not elsewhere classified: Secondary | ICD-10-CM | POA: Diagnosis not present

## 2022-10-19 DIAGNOSIS — D472 Monoclonal gammopathy: Secondary | ICD-10-CM | POA: Insufficient documentation

## 2022-10-19 DIAGNOSIS — N189 Chronic kidney disease, unspecified: Secondary | ICD-10-CM | POA: Diagnosis not present

## 2022-10-19 DIAGNOSIS — E1122 Type 2 diabetes mellitus with diabetic chronic kidney disease: Secondary | ICD-10-CM | POA: Diagnosis not present

## 2022-10-19 DIAGNOSIS — I129 Hypertensive chronic kidney disease with stage 1 through stage 4 chronic kidney disease, or unspecified chronic kidney disease: Secondary | ICD-10-CM | POA: Diagnosis not present

## 2022-10-19 DIAGNOSIS — G8929 Other chronic pain: Secondary | ICD-10-CM | POA: Insufficient documentation

## 2022-10-19 DIAGNOSIS — C9000 Multiple myeloma not having achieved remission: Secondary | ICD-10-CM | POA: Diagnosis not present

## 2022-10-19 LAB — COMPREHENSIVE METABOLIC PANEL
ALT: 23 U/L (ref 0–44)
AST: 16 U/L (ref 15–41)
Albumin: 3.5 g/dL (ref 3.5–5.0)
Alkaline Phosphatase: 78 U/L (ref 38–126)
Anion gap: 8 (ref 5–15)
BUN: 17 mg/dL (ref 8–23)
CO2: 25 mmol/L (ref 22–32)
Calcium: 9.3 mg/dL (ref 8.9–10.3)
Chloride: 105 mmol/L (ref 98–111)
Creatinine, Ser: 1.41 mg/dL — ABNORMAL HIGH (ref 0.61–1.24)
GFR, Estimated: 54 mL/min — ABNORMAL LOW (ref >60)
Glucose, Bld: 119 mg/dL — ABNORMAL HIGH (ref 70–99)
Potassium: 4.4 mmol/L (ref 3.5–5.1)
Sodium: 138 mmol/L (ref 135–145)
Total Bilirubin: 0.5 mg/dL (ref 0.3–1.2)
Total Protein: 8 g/dL (ref 6.5–8.1)

## 2022-10-19 LAB — CBC WITH DIFFERENTIAL/PLATELET
Abs Immature Granulocytes: 0.01 10*3/uL (ref 0.00–0.07)
Basophils Absolute: 0 10*3/uL (ref 0.0–0.1)
Basophils Relative: 1 %
Eosinophils Absolute: 0.1 10*3/uL (ref 0.0–0.5)
Eosinophils Relative: 1 %
HCT: 40.8 % (ref 39.0–52.0)
Hemoglobin: 13.3 g/dL (ref 13.0–17.0)
Immature Granulocytes: 0 %
Lymphocytes Relative: 41 %
Lymphs Abs: 2.2 10*3/uL (ref 0.7–4.0)
MCH: 30.4 pg (ref 26.0–34.0)
MCHC: 32.6 g/dL (ref 30.0–36.0)
MCV: 93.4 fL (ref 80.0–100.0)
Monocytes Absolute: 0.5 10*3/uL (ref 0.1–1.0)
Monocytes Relative: 10 %
Neutro Abs: 2.5 10*3/uL (ref 1.7–7.7)
Neutrophils Relative %: 47 %
Platelets: 244 10*3/uL (ref 150–400)
RBC: 4.37 MIL/uL (ref 4.22–5.81)
RDW: 13.5 % (ref 11.5–15.5)
WBC: 5.3 10*3/uL (ref 4.0–10.5)
nRBC: 0 % (ref 0.0–0.2)

## 2022-10-19 LAB — LACTATE DEHYDROGENASE: LDH: 89 U/L — ABNORMAL LOW (ref 98–192)

## 2022-10-20 LAB — KAPPA/LAMBDA LIGHT CHAINS
Kappa free light chain: 24.4 mg/L — ABNORMAL HIGH (ref 3.3–19.4)
Kappa, lambda light chain ratio: 0.35 (ref 0.26–1.65)
Lambda free light chains: 70.3 mg/L — ABNORMAL HIGH (ref 5.7–26.3)

## 2022-10-23 LAB — PROTEIN ELECTROPHORESIS, SERUM
A/G Ratio: 1 (ref 0.7–1.7)
Albumin ELP: 3.7 g/dL (ref 2.9–4.4)
Alpha-1-Globulin: 0.2 g/dL (ref 0.0–0.4)
Alpha-2-Globulin: 0.6 g/dL (ref 0.4–1.0)
Beta Globulin: 1.1 g/dL (ref 0.7–1.3)
Gamma Globulin: 1.7 g/dL (ref 0.4–1.8)
Globulin, Total: 3.6 g/dL (ref 2.2–3.9)
M-Spike, %: 1.5 g/dL — ABNORMAL HIGH
Total Protein ELP: 7.3 g/dL (ref 6.0–8.5)

## 2022-10-26 ENCOUNTER — Inpatient Hospital Stay: Payer: Medicare HMO | Admitting: Hematology

## 2022-10-26 VITALS — BP 132/73 | HR 74 | Temp 98.4°F | Resp 17 | Ht 67.0 in | Wt 194.6 lb

## 2022-10-26 DIAGNOSIS — G8929 Other chronic pain: Secondary | ICD-10-CM | POA: Diagnosis not present

## 2022-10-26 DIAGNOSIS — D472 Monoclonal gammopathy: Secondary | ICD-10-CM

## 2022-10-26 DIAGNOSIS — C9000 Multiple myeloma not having achieved remission: Secondary | ICD-10-CM | POA: Diagnosis not present

## 2022-10-26 DIAGNOSIS — M461 Sacroiliitis, not elsewhere classified: Secondary | ICD-10-CM | POA: Diagnosis not present

## 2022-10-26 DIAGNOSIS — E1122 Type 2 diabetes mellitus with diabetic chronic kidney disease: Secondary | ICD-10-CM | POA: Diagnosis not present

## 2022-10-26 DIAGNOSIS — Z87891 Personal history of nicotine dependence: Secondary | ICD-10-CM | POA: Diagnosis not present

## 2022-10-26 DIAGNOSIS — N189 Chronic kidney disease, unspecified: Secondary | ICD-10-CM | POA: Diagnosis not present

## 2022-10-26 DIAGNOSIS — I129 Hypertensive chronic kidney disease with stage 1 through stage 4 chronic kidney disease, or unspecified chronic kidney disease: Secondary | ICD-10-CM | POA: Diagnosis not present

## 2022-10-26 NOTE — Patient Instructions (Addendum)
[facility]  Discharge Instructions  You were seen and examined today by Dr. Katragadda.  Dr. Katragadda discussed your most recent lab work which revealed that everything looks good and stable.  Follow-up as scheduled in 6 months.    Thank you for choosing [facility] to provide your oncology and hematology care.   To afford each patient quality time with our provider, please arrive at least 15 minutes before your scheduled appointment time. You may need to reschedule your appointment if you arrive late (10 or more minutes). Arriving late affects you and other patients whose appointments are after yours.  Also, if you miss three or more appointments without notifying the office, you may be dismissed from the clinic at the provider's discretion.    Again, thank you for choosing [facility].  Our hope is that these requests will decrease the amount of time that you wait before being seen by our physicians.   If you have a lab appointment with the Cancer Center - please note that after April 8th, all labs will be drawn in the cancer center.  You do not have to check in or register with the main entrance as you have in the past but will complete your check-in at the cancer center.            _____________________________________________________________  Should you have questions after your visit to [facility], please contact our office at [phone] and follow the prompts.  Our office hours are 8:00 a.m. to 4:30 p.m. Monday - Thursday and 8:00 a.m. to 2:30 p.m. Friday.  Please note that voicemails left after 4:00 p.m. may not be returned until the following business day.  We are closed weekends and all major holidays.  You do have access to a nurse 24-7, just call the main number to the clinic [phone] and do not press any options, hold on the line and a nurse will answer the phone.    For prescription  refill requests, have your pharmacy contact our office and allow 72 hours.    Masks are no longer required in the cancer centers. If you would like for your care team to wear a mask while they are taking care of you, please let them know. You may have one support person who is at least 70 years old accompany you for your appointments.  

## 2022-10-26 NOTE — Progress Notes (Signed)
[facility] [address], [place] [address]    Clinic Day:  10/26/2022  Referring physician: Elfredia Nevins, MD  Patient Care Team: Elfredia Nevins, MD as PCP - General (Internal Medicine) Jonelle Sidle, MD as PCP - Cardiology (Cardiology) Jena Gauss Gerrit Friends, MD as Attending Physician (Gastroenterology) Pcp, No   ASSESSMENT & PLAN:   Assessment: 1.  IgG lambda smoldering multiple myeloma: -BM BX on 07/08/2015 shows normocellular marrow with monoclonal plasmacytosis, 10-20%, 46, XY, myeloma FISH panel negative -Skeletal survey on 06/04/2019 was negative for lytic lesions.   2.  CKD: -Baseline creatinine between 1.4-1.6.    Plan: 1.  IgG lambda smoldering multiple myeloma: - He does not report any new onset pains.  Chronic back pain is stable. - He works out at [employer] daily. - Reviewed labs from 10/19/2022: Creatinine elevated from CKD stable at 1.41.  Calcium 9.3.  CBC normal.  M spike is stable at 1.5 g. - Lambda light chains have increased to 70 from 55.  However free light chain ratio is normal at 0.35. - Skeletal survey (10/19/2022): No lytic lesions.  Bilateral sacroiliitis and lumbar spine degenerative changes discussed with patient. - Recommend follow-up in 6 months with repeat myeloma labs.   2.  CKD: - Latest creatinine is 1.41.  Baseline creatinine 1.3-1.5.    Orders Placed This Encounter  Procedures   CBC with Differential/Platelet    Standing Status:   Future    Standing Expiration Date:   10/26/2023    Order Specific Question:   Release to patient    Answer:   Immediate   Comprehensive metabolic panel    Standing Status:   Future    Standing Expiration Date:   10/26/2023    Order Specific Question:   Release to patient    Answer:   Immediate   Protein electrophoresis, serum    Standing Status:   Future    Standing Expiration Date:   10/26/2023    Order Specific Question:   Release to patient    Answer:   Immediate   Kappa/lambda  light chains    Standing Status:   Future    Standing Expiration Date:   10/26/2023      I,Katie Daubenspeck,acting as a scribe for Doreatha Massed, MD.,have documented all relevant documentation on the behalf of Doreatha Massed, MD,as directed by  Doreatha Massed, MD while in the presence of Doreatha Massed, MD.   I, Doreatha Massed MD, have reviewed the above documentation for accuracy and completeness, and I agree with the above.   Doreatha Massed, MD   6/27/20243:49 PM  CHIEF COMPLAINT:   Diagnosis: smoldering IgG lambda multiple myeloma    Cancer Staging  No matching staging information was found for the patient.   Prior Therapy: none  Current Therapy:  surveillance    HISTORY OF PRESENT ILLNESS:   Oncology History   No history exists.     INTERVAL HISTORY:   Benji is a 72 y.o. male presenting to clinic today for follow up of smoldering IgG lambda multiple myeloma. He was last seen by me on 04/20/22.  Since his last visit, he underwent surveillance metastatic bone survey on 10/19/22 showing: no lytic lesions; bilateral sacroiliitis.  Today, he states that he is doing well overall. His appetite level is at 100%. His energy level is at 100%.  PAST MEDICAL HISTORY:   Past Medical History: Past Medical History:  Diagnosis Date   Alcohol dependency (HCC)  Chronic kidney disease    Colonic adenoma 2006   Essential hypertension    History of cardiac catheterization    03/2013 LM nl, LAD nl, D1/2/3 small - nl, LCX nondom - nl, OM1/2/3 nl, RCA dom   Hypercholesteremia    IgG lambda monoclonal gammopathy 06/24/2015   Nonischemic cardiomyopathy (HCC)    LVEF approximately 40% 06/2013 - improved to normal range   Obesity    Type 2 diabetes mellitus [facility])     Surgical History: Past Surgical History:  Procedure Laterality Date   CIRCUMCISION     COLONOSCOPY  05/20/2004   RMR: Internal hemorrhoids.  Diminutive adenomatous polyp in the  mid-right colon, otherwise normal   COLONOSCOPY N/A 06/19/2012   Procedure: COLONOSCOPY;  Surgeon: Corbin Ade, MD;  Location: [facility];  Service: Endoscopy;  Laterality: N/A;  8:30AM-rescheduled 10:30am [name] notified pt   COLONOSCOPY N/A 08/08/2017   Surgeon: Corbin Ade, MD;  diverticulosis in sigmoid, descending, and transverse colon, three 4-7 mm polyps in ascending colon, and one 9 mm polyp in ascending colon. Pathology with tubular adenomas. Repeat in 3 years.   COLONOSCOPY N/A 04/23/2019   Sigmoid and descending colon diverticulosis, one 5 mm polyp (tubular adenoma) in descending colon, non-bleeding internal hemorrhoids. 5 year surveillance.    Ileocolonoscopy  06/28/2007   [id number] rectum/Submucosal sigmoid diverticula (chronic), doubt clinical significance Hyperplastic polypoid-appearing fold, mid ascending colon, status/Remainder colonic mucosa and terminal ileum mucosa appeared normal    LEFT AND RIGHT HEART CATHETERIZATION WITH CORONARY ANGIOGRAM  03/19/2013   Procedure: LEFT AND RIGHT HEART CATHETERIZATION WITH CORONARY ANGIOGRAM;  Surgeon: Iran Ouch, MD;  Location: [facility] CATH [facility];  Service: Cardiovascular;;   POLYPECTOMY  08/08/2017   Procedure: POLYPECTOMY;  Surgeon: Corbin Ade, MD;  Location: [facility];  Service: Endoscopy;;  ascending colon polyps x4 (cold snare-3,hot snare)   TOTAL HIP ARTHROPLASTY  2010   Left   TOTAL HIP ARTHROPLASTY  2010   Right    Social History: Social History   Socioeconomic History   Marital status: Married    Spouse name: Not on file   Number of children: 2   Years of education: Not on file   Highest education level: Not on file  Occupational History   Occupation: Retired, Set designer  Tobacco Use   Smoking status: Former    Packs/day: 1.00    Years: 12.00    Additional pack years: 0.00    Total pack years: 12.00    Types: Cigarettes    Quit date: 05/28/2008    Years since quitting: 14.4   Smokeless  tobacco: Never  Vaping Use   Vaping Use: Never used  Substance and Sexual Activity   Alcohol use: Yes    Comment: Occasionally 2 to 3 times a month   Drug use: Not Currently    Types: Marijuana    Comment: Smoked via a bowl occassionally.    Sexual activity: Not on file  Other Topics Concern   Not on file  Social History Narrative   Not on file   Social Determinants of Health   Financial Resource Strain: Not on file  Food Insecurity: No Food Insecurity (04/14/2020)   Hunger Vital Sign    Worried About Running Out of Food in the Last Year: Never true    Ran Out of Food in the Last Year: Never true  Transportation Needs: No Transportation Needs (04/14/2020)   PRAPARE - Administrator, Civil Service (Medical):  No    Lack of Transportation (Non-Medical): No  Physical Activity: Inactive (04/14/2020)   Exercise Vital Sign    Days of Exercise per Week: 0 days    Minutes of Exercise per Session: 0 min  Stress: No Stress Concern Present (04/14/2020)   [employer] of Occupational Health - Occupational Stress Questionnaire    Feeling of Stress : Not at all  Social Connections: Moderately Integrated (04/14/2020)   Social Connection and Isolation Panel [NHANES]    Frequency of Communication with Friends and Family: More than three times a week    Frequency of Social Gatherings with Friends and Family: Three times a week    Attends Religious Services: 1 to 4 times per year    Active Member of Clubs or Organizations: No    Attends Banker Meetings: Never    Marital Status: Married  Catering manager Violence: Not At Risk (04/14/2020)   Humiliation, Afraid, Rape, and Kick questionnaire    Fear of Current or Ex-Partner: No    Emotionally Abused: No    Physically Abused: No    Sexually Abused: No    Family History: Family History  Problem Relation Age of Onset   Heart failure Mother    Diabetes Mother    Hypertension Mother    Heart failure Sister     Hypertension Sister    Cancer Sister    Colon cancer Neg Hx     Current Medications:  Current Outpatient Medications:    aspirin 81 MG tablet, Take 81 mg by mouth daily., Disp: , Rfl:    carvedilol (COREG) 12.5 MG tablet, Take 1 tablet (12.5 mg total) by mouth 2 (two) times daily., Disp: 180 tablet, Rfl: 3   furosemide (LASIX) 20 MG tablet, TAKE 1 TABLET (20 MG TOTAL) BY MOUTH AS NEEDED (FOR WEIGHT GAIN OF 2-3 LBS OVERNIGHT OR 5 LBS IN ONE WEEK)., Disp: 90 tablet, Rfl: 3   glimepiride (AMARYL) 4 MG tablet, Take 4 mg by mouth 2 (two) times daily., Disp: , Rfl:    HYDROcodone-acetaminophen (NORCO) 10-325 MG tablet, Take 1 tablet by mouth every 6 (six) hours., Disp: , Rfl:    lisinopril (ZESTRIL) 5 MG tablet, Take 5 mg by mouth daily., Disp: , Rfl:    metFORMIN (GLUCOPHAGE) 500 MG tablet, Take 500 mg by mouth daily with breakfast., Disp: , Rfl:    Multiple Vitamins-Iron (MULTIVITAMIN/IRON PO), Take 1 tablet by mouth daily. , Disp: , Rfl:    ONETOUCH ULTRA test strip, 1 each 2 (two) times daily., Disp: , Rfl:    simvastatin (ZOCOR) 20 MG tablet, Take 20 mg by mouth daily. , Disp: , Rfl: 3   spironolactone (ALDACTONE) 25 MG tablet, TAKE 1 TABLET (25 MG TOTAL) BY MOUTH DAILY., Disp: 90 tablet, Rfl: 1   traZODone (DESYREL) 50 MG tablet, Take 50 mg by mouth at bedtime., Disp: , Rfl:    VISINE 0.05 % ophthalmic solution, SMARTSIG:1 Drop(s) In Eye(s) PRN, Disp: , Rfl:    zolpidem (AMBIEN) 10 MG tablet, Take 10 mg by mouth at bedtime., Disp: , Rfl:    Allergies: Allergies  Allergen Reactions   Other    Peanut-Containing Drug Products Rash    *Cashews    REVIEW OF SYSTEMS:   Review of Systems  Constitutional:  Negative for chills, fatigue and fever.  HENT:   Negative for lump/mass, mouth sores, nosebleeds, sore throat and trouble swallowing.   Eyes:  Negative for eye problems.  Respiratory:  Negative for cough  and shortness of breath.   Cardiovascular:  Negative for chest pain, leg swelling  and palpitations.  Gastrointestinal:  Negative for abdominal pain, constipation, diarrhea, nausea and vomiting.  Genitourinary:  Negative for bladder incontinence, difficulty urinating, dysuria, frequency, hematuria and nocturia.   Musculoskeletal:  Positive for back pain. Negative for arthralgias, flank pain, myalgias and neck pain.  Skin:  Negative for itching and rash.  Neurological:  Negative for dizziness, headaches and numbness.  Hematological:  Does not bruise/bleed easily.  Psychiatric/Behavioral:  Negative for depression, sleep disturbance and suicidal ideas. The patient is not nervous/anxious.   All other systems reviewed and are negative.    VITALS:   Blood pressure 132/73, pulse 74, temperature 98.4 F (36.9 C), temperature source Oral, resp. rate 17, height 5\' 7"  (1.702 m), weight 194 lb 9.6 oz (88.3 kg), SpO2 98 %.  Wt Readings from Last 3 Encounters:  10/26/22 194 lb 9.6 oz (88.3 kg)  10/06/22 194 lb (88 kg)  04/20/22 203 lb 1.6 oz (92.1 kg)    Body mass index is 30.48 kg/m.  Performance status (ECOG): 1 - Symptomatic but completely ambulatory  PHYSICAL EXAM:   Physical Exam Vitals and nursing note reviewed. Exam conducted with a chaperone present.  Constitutional:      Appearance: Normal appearance.  Cardiovascular:     Rate and Rhythm: Normal rate and regular rhythm.     Pulses: Normal pulses.     Heart sounds: Normal heart sounds.  Pulmonary:     Effort: Pulmonary effort is normal.     Breath sounds: Normal breath sounds.  Abdominal:     Palpations: Abdomen is soft. There is no hepatomegaly, splenomegaly or mass.     Tenderness: There is no abdominal tenderness.  Musculoskeletal:     Right lower leg: No edema.     Left lower leg: No edema.  Lymphadenopathy:     Cervical: No cervical adenopathy.     Right cervical: No superficial, deep or posterior cervical adenopathy.    Left cervical: No superficial, deep or posterior cervical adenopathy.     Upper  Body:     Right upper body: No supraclavicular or axillary adenopathy.     Left upper body: No supraclavicular or axillary adenopathy.  Neurological:     General: No focal deficit present.     Mental Status: He is alert and oriented to person, place, and time.  Psychiatric:        Mood and Affect: Mood normal.        Behavior: Behavior normal.     LABS:      Latest Ref Rng & Units 10/19/2022   12:44 PM 04/13/2022    1:22 PM 10/05/2021   12:27 PM  CBC  WBC 4.0 - 10.5 K/uL 5.3  5.0  7.3   Hemoglobin 13.0 - 17.0 g/dL 01.0  27.2  53.6   Hematocrit 39.0 - 52.0 % 40.8  41.4  39.3   Platelets 150 - 400 K/uL 244  254  247       Latest Ref Rng & Units 10/19/2022   12:44 PM 04/13/2022    1:22 PM 10/05/2021   12:27 PM  CMP  Glucose 70 - 99 mg/dL 644  034  742   BUN 8 - 23 mg/dL 17  19  24    Creatinine 0.61 - 1.24 mg/dL 5.95  6.38  7.56   Sodium 135 - 145 mmol/L 138  137  139   Potassium 3.5 - 5.1 mmol/L 4.4  4.4  4.3   Chloride 98 - 111 mmol/L 105  108  109   CO2 22 - 32 mmol/L 25  24  24    Calcium 8.9 - 10.3 mg/dL 9.3  8.8  9.3   Total Protein 6.5 - 8.1 g/dL 8.0  7.9  7.8   Total Bilirubin 0.3 - 1.2 mg/dL 0.5  0.3  0.3   Alkaline Phos 38 - 126 U/L 78  88  94   AST 15 - 41 U/L 16  16  17    ALT 0 - 44 U/L 23  26  28       No results found for: "CEA1", "CEA" / No results found for: "CEA1", "CEA" No results found for: "PSA1" No results found for: "FIE332" No results found for: "CAN125"  Lab Results  Component Value Date   TOTALPROTELP 7.3 10/19/2022   ALBUMINELP 3.7 10/19/2022   A1GS 0.2 10/19/2022   A2GS 0.6 10/19/2022   BETS 1.1 10/19/2022   GAMS 1.7 10/19/2022   MSPIKE 1.5 (H) 10/19/2022   SPEI Comment 10/19/2022   Lab Results  Component Value Date   TIBC 381 07/05/2015   FERRITIN 79 07/05/2015   IRONPCTSAT 24 07/05/2015   Lab Results  Component Value Date   LDH 89 (L) 10/19/2022   LDH 86 (L) 04/07/2020   LDH 88 (L) 12/05/2019     STUDIES:   DG Bone Survey  Met  Result Date: 10/24/2022 CLINICAL DATA:  Myeloma. EXAM: METASTATIC BONE SURVEY COMPARISON:  10/06/2001 FINDINGS: Lytic lesions. Cervical, thoracic and lumbar spine degenerative changes. Sclerosis on both sides both sacroiliac joints. Bilateral total hip prostheses. Atheromatous arterial calcifications. Normal sized heart. Clear lungs. IMPRESSION: 1. No lytic lesions. 2. Bilateral sacroiliitis. Electronically Signed   By: Beckie Salts M.D.   On: 10/24/2022 20:19

## 2022-11-25 ENCOUNTER — Other Ambulatory Visit: Payer: Self-pay

## 2022-11-25 ENCOUNTER — Emergency Department (HOSPITAL_COMMUNITY): Payer: Medicare HMO

## 2022-11-25 ENCOUNTER — Inpatient Hospital Stay (HOSPITAL_COMMUNITY)
Admission: EM | Admit: 2022-11-25 | Discharge: 2022-11-27 | DRG: 176 | Disposition: A | Discharge status: Home / Self Care | Payer: Medicare HMO | Attending: Internal Medicine | Admitting: Internal Medicine

## 2022-11-25 ENCOUNTER — Encounter (HOSPITAL_COMMUNITY): Payer: Self-pay

## 2022-11-25 DIAGNOSIS — R7989 Other specified abnormal findings of blood chemistry: Secondary | ICD-10-CM | POA: Insufficient documentation

## 2022-11-25 DIAGNOSIS — Z87891 Personal history of nicotine dependence: Secondary | ICD-10-CM | POA: Diagnosis not present

## 2022-11-25 DIAGNOSIS — I428 Other cardiomyopathies: Secondary | ICD-10-CM

## 2022-11-25 DIAGNOSIS — Z79899 Other long term (current) drug therapy: Secondary | ICD-10-CM | POA: Diagnosis not present

## 2022-11-25 DIAGNOSIS — Z7982 Long term (current) use of aspirin: Secondary | ICD-10-CM

## 2022-11-25 DIAGNOSIS — I959 Hypotension, unspecified: Secondary | ICD-10-CM | POA: Diagnosis not present

## 2022-11-25 DIAGNOSIS — Z833 Family history of diabetes mellitus: Secondary | ICD-10-CM

## 2022-11-25 DIAGNOSIS — I2609 Other pulmonary embolism with acute cor pulmonale: Principal | ICD-10-CM | POA: Diagnosis present

## 2022-11-25 DIAGNOSIS — I130 Hypertensive heart and chronic kidney disease with heart failure and stage 1 through stage 4 chronic kidney disease, or unspecified chronic kidney disease: Secondary | ICD-10-CM | POA: Diagnosis not present

## 2022-11-25 DIAGNOSIS — R9431 Abnormal electrocardiogram [ECG] [EKG]: Secondary | ICD-10-CM

## 2022-11-25 DIAGNOSIS — N183 Chronic kidney disease, stage 3 unspecified: Secondary | ICD-10-CM | POA: Diagnosis present

## 2022-11-25 DIAGNOSIS — I10 Essential (primary) hypertension: Secondary | ICD-10-CM | POA: Diagnosis not present

## 2022-11-25 DIAGNOSIS — Z809 Family history of malignant neoplasm, unspecified: Secondary | ICD-10-CM

## 2022-11-25 DIAGNOSIS — Z859 Personal history of malignant neoplasm, unspecified: Secondary | ICD-10-CM

## 2022-11-25 DIAGNOSIS — I2699 Other pulmonary embolism without acute cor pulmonale: Secondary | ICD-10-CM | POA: Diagnosis not present

## 2022-11-25 DIAGNOSIS — Z6830 Body mass index (BMI) 30.0-30.9, adult: Secondary | ICD-10-CM | POA: Diagnosis not present

## 2022-11-25 DIAGNOSIS — I5032 Chronic diastolic (congestive) heart failure: Secondary | ICD-10-CM | POA: Diagnosis present

## 2022-11-25 DIAGNOSIS — Z96643 Presence of artificial hip joint, bilateral: Secondary | ICD-10-CM | POA: Diagnosis present

## 2022-11-25 DIAGNOSIS — E782 Mixed hyperlipidemia: Secondary | ICD-10-CM | POA: Diagnosis not present

## 2022-11-25 DIAGNOSIS — R531 Weakness: Secondary | ICD-10-CM | POA: Diagnosis not present

## 2022-11-25 DIAGNOSIS — N1832 Chronic kidney disease, stage 3b: Secondary | ICD-10-CM

## 2022-11-25 DIAGNOSIS — Z86010 Personal history of colonic polyps: Secondary | ICD-10-CM | POA: Diagnosis not present

## 2022-11-25 DIAGNOSIS — R001 Bradycardia, unspecified: Secondary | ICD-10-CM | POA: Diagnosis not present

## 2022-11-25 DIAGNOSIS — I4430 Unspecified atrioventricular block: Secondary | ICD-10-CM | POA: Diagnosis not present

## 2022-11-25 DIAGNOSIS — E1165 Type 2 diabetes mellitus with hyperglycemia: Secondary | ICD-10-CM | POA: Diagnosis present

## 2022-11-25 DIAGNOSIS — Z8249 Family history of ischemic heart disease and other diseases of the circulatory system: Secondary | ICD-10-CM

## 2022-11-25 DIAGNOSIS — R0602 Shortness of breath: Secondary | ICD-10-CM | POA: Diagnosis not present

## 2022-11-25 DIAGNOSIS — N189 Chronic kidney disease, unspecified: Secondary | ICD-10-CM | POA: Diagnosis present

## 2022-11-25 DIAGNOSIS — Z91010 Allergy to peanuts: Secondary | ICD-10-CM | POA: Diagnosis not present

## 2022-11-25 DIAGNOSIS — R600 Localized edema: Secondary | ICD-10-CM | POA: Diagnosis not present

## 2022-11-25 DIAGNOSIS — E669 Obesity, unspecified: Secondary | ICD-10-CM | POA: Diagnosis not present

## 2022-11-25 DIAGNOSIS — D472 Monoclonal gammopathy: Secondary | ICD-10-CM | POA: Diagnosis not present

## 2022-11-25 DIAGNOSIS — Z7984 Long term (current) use of oral hypoglycemic drugs: Secondary | ICD-10-CM | POA: Diagnosis not present

## 2022-11-25 DIAGNOSIS — E1122 Type 2 diabetes mellitus with diabetic chronic kidney disease: Secondary | ICD-10-CM | POA: Diagnosis not present

## 2022-11-25 DIAGNOSIS — N1831 Chronic kidney disease, stage 3a: Secondary | ICD-10-CM | POA: Diagnosis present

## 2022-11-25 DIAGNOSIS — R000 Tachycardia, unspecified: Secondary | ICD-10-CM | POA: Diagnosis not present

## 2022-11-25 DIAGNOSIS — I2602 Saddle embolus of pulmonary artery with acute cor pulmonale: Secondary | ICD-10-CM | POA: Diagnosis not present

## 2022-11-25 LAB — CBC
HCT: 45.1 % (ref 39.0–52.0)
Hemoglobin: 15.2 g/dL (ref 13.0–17.0)
MCH: 30.6 pg (ref 26.0–34.0)
MCHC: 33.7 g/dL (ref 30.0–36.0)
MCV: 90.9 fL (ref 80.0–100.0)
Platelets: 228 10*3/uL (ref 150–400)
RBC: 4.96 MIL/uL (ref 4.22–5.81)
RDW: 14 % (ref 11.5–15.5)
WBC: 6.8 10*3/uL (ref 4.0–10.5)
nRBC: 0 % (ref 0.0–0.2)

## 2022-11-25 LAB — URINALYSIS, ROUTINE W REFLEX MICROSCOPIC
Bacteria, UA: NONE SEEN
Bilirubin Urine: NEGATIVE
Glucose, UA: NEGATIVE mg/dL
Hgb urine dipstick: NEGATIVE
Ketones, ur: NEGATIVE mg/dL
Leukocytes,Ua: NEGATIVE
Nitrite: NEGATIVE
Protein, ur: 100 mg/dL — AB
Specific Gravity, Urine: 1.019 (ref 1.005–1.030)
pH: 5 (ref 5.0–8.0)

## 2022-11-25 LAB — BASIC METABOLIC PANEL
Anion gap: 10 (ref 5–15)
BUN: 28 mg/dL — ABNORMAL HIGH (ref 8–23)
CO2: 14 mmol/L — ABNORMAL LOW (ref 22–32)
Calcium: 8.8 mg/dL — ABNORMAL LOW (ref 8.9–10.3)
Chloride: 111 mmol/L (ref 98–111)
Creatinine, Ser: 1.63 mg/dL — ABNORMAL HIGH (ref 0.61–1.24)
GFR, Estimated: 45 mL/min — ABNORMAL LOW (ref >60)
Glucose, Bld: 168 mg/dL — ABNORMAL HIGH (ref 70–99)
Potassium: 4.3 mmol/L (ref 3.5–5.1)
Sodium: 135 mmol/L (ref 135–145)

## 2022-11-25 LAB — GLUCOSE, CAPILLARY: Glucose-Capillary: 67 mg/dL — ABNORMAL LOW (ref 70–99)

## 2022-11-25 LAB — D-DIMER, QUANTITATIVE: D-Dimer, Quant: 9.44 ug/mL-FEU — ABNORMAL HIGH (ref 0.00–0.50)

## 2022-11-25 LAB — TROPONIN I (HIGH SENSITIVITY): Troponin I (High Sensitivity): 89 ng/L — ABNORMAL HIGH (ref <18)

## 2022-11-25 LAB — BRAIN NATRIURETIC PEPTIDE: B Natriuretic Peptide: 192 pg/mL — ABNORMAL HIGH (ref 0.0–100.0)

## 2022-11-25 LAB — CBG MONITORING, ED: Glucose-Capillary: 136 mg/dL — ABNORMAL HIGH (ref 70–99)

## 2022-11-25 MED ORDER — IOHEXOL 300 MG/ML  SOLN: 100.0000 mL | Freq: Once | INTRAMUSCULAR | Status: DC | PRN

## 2022-11-25 MED ORDER — HEPARIN BOLUS VIA INFUSION
5000.0000 [IU] | Freq: Once | INTRAVENOUS | Status: AC
  Administered 2022-11-25: 5000 [IU] via INTRAVENOUS

## 2022-11-25 MED ORDER — CHLORHEXIDINE GLUCONATE CLOTH 2 % EX PADS
6.0000 | MEDICATED_PAD | Freq: Every day | CUTANEOUS | Status: DC
  Administered 2022-11-26 – 2022-11-27 (×2): 6 via TOPICAL

## 2022-11-25 MED ORDER — HEPARIN (PORCINE) 25000 UT/250ML-% IV SOLN
1150.0000 [IU]/h | INTRAVENOUS | Status: DC
  Administered 2022-11-25 – 2022-11-26 (×2): 1400 [IU]/h via INTRAVENOUS
  Filled 2022-11-25 (×2): qty 250

## 2022-11-25 MED ORDER — IOHEXOL 350 MG/ML SOLN
75.0000 mL | Freq: Once | INTRAVENOUS | Status: AC | PRN
  Administered 2022-11-25: 75 mL via INTRAVENOUS

## 2022-11-25 NOTE — Progress Notes (Signed)
11/25/2022  Case, CTA, labs reviewed. Submassive PE. Class IV risk only due to hx of cancer, age, and heart failure; vital signs are benign and no high risk clinical history features. I do not see indication for advanced intervention at this time. Would do heparin x 24h, switch to NoAC.  Okay for stepdown. Check an echo, should it show clot in transit could consider systemic lytics. Bedrest x 24h. Should he deteriorate in any way please reach out.   Myrla Halsted MD PCCM

## 2022-11-25 NOTE — Progress Notes (Signed)
ANTICOAGULATION CONSULT NOTE - Initial Consult  Pharmacy Consult for Heparin  Indication: pulmonary embolus  Allergies  Allergen Reactions   Other    Peanut-Containing Drug Products Rash    *Cashews    Patient Measurements: Height: 5\' 7"  (170.2 cm) Weight: 88.3 kg (194 lb 10.7 oz) IBW/kg (Calculated) : 66.1  Vital Signs: Temp: 97.7 F (36.5 C) (07/27 1855) BP: 130/90 (07/27 2230) Pulse Rate: 78 (07/27 2230)  Labs: Recent Labs    11/25/22 1915  HGB 15.2  HCT 45.1  PLT 228  CREATININE 1.63*  TROPONINIHS 89*    Estimated Creatinine Clearance: 44.7 mL/min (A) (by C-G formula based on SCr of 1.63 mg/dL (H)).   Medical History: Past Medical History:  Diagnosis Date   Alcohol dependency (HCC)    Chronic kidney disease    Colonic adenoma 2006   Essential hypertension    History of cardiac catheterization    03/2013 LM nl, LAD nl, D1/2/3 small - nl, LCX nondom - nl, OM1/2/3 nl, RCA dom   Hypercholesteremia    IgG lambda monoclonal gammopathy 06/24/2015   Nonischemic cardiomyopathy (HCC)    LVEF approximately 40% 06/2013 - improved to normal range   Obesity    Type 2 diabetes mellitus [facility])     Assessment: 72 y/o M presents to the ED with weakness, tachycardia, and shortness of breath. Found to have new onset pulmonary embolus via CT angio. Starting heparin per pharmacy. CBC good. Noted renal dysfunction. PTA meds reviewed.   Goal of Therapy:  Heparin level 0.3-0.7 units/ml Monitor platelets by anticoagulation protocol: Yes   Plan:  Heparin 5000 units BOLUS Start heparin drip at 1400 units/hr 8 Hour Heparin level Daily CBC/Heparin level Monitor for bleeding  Abran Duke, PharmD, BCPS Clinical Pharmacist Phone: [phone]

## 2022-11-25 NOTE — ED Triage Notes (Signed)
Pt comes in with complaints of HTN, weakness, shortness of breath starting Thursday. Pt states worsening symptoms today. Pt states his HR got up to 120. Pt denies hx of A-fib, pt states CHF.

## 2022-11-25 NOTE — ED Notes (Signed)
Pt to xray

## 2022-11-25 NOTE — ED Provider Notes (Signed)
[place] EMERGENCY DEPARTMENT AT [facility] Provider Note   CSN: [id number] Arrival date & time: 11/25/22  4010     History  Chief Complaint  Patient presents with   Shortness of Breath    Corey Murphy is a 72 y.o. male.   Shortness of Breath  Patient is a 72 year old male, he reports a distant history of congestive heart failure, was quite severe when it initially occurred but over the last few years he improved and an echocardiogram in November 2023 showed an ejection fraction of 60 to 65%, the patient has a history of hypertension as well as diabetes and takes metformin and lisinopril.  The patient had a nonischemic cardiomyopathy.  He is also on Coreg and Aldactone and as needed Lasix.  He does have stage IIIb chronic kidney disease with a baseline creatinine of 1.6 based on February labs.  He presents with several days of shortness of breath, initially it was very mild but today it was much worse and he noted that his heart rate was going up, initially it was in the 90s, today it was up to 130, he was having severe dyspnea trying to walk up stairs which is unusual for him as he usually walks 40 to 60 minutes on the treadmill 5 times a week at the gym.  He has no swelling of the legs, no cough, no fever, no chest pain, no back pain, no headache.    Home Medications Prior to Admission medications   Medication Sig Start Date End Date Taking? Authorizing Provider  aspirin 81 MG tablet Take 81 mg by mouth daily.    [provider]  carvedilol (COREG) 12.5 MG tablet Take 1 tablet (12.5 mg total) by mouth 2 (two) times daily. 03/10/22 03/10/23  Jonelle Sidle, MD  furosemide (LASIX) 20 MG tablet TAKE 1 TABLET (20 MG TOTAL) BY MOUTH AS NEEDED (FOR WEIGHT GAIN OF 2-3 LBS OVERNIGHT OR 5 LBS IN ONE WEEK). 09/08/22 12/07/22  Jonelle Sidle, MD  glimepiride (AMARYL) 4 MG tablet Take 4 mg by mouth 2 (two) times daily. 03/28/21   [provider]   HYDROcodone-acetaminophen (NORCO) 10-325 MG tablet Take 1 tablet by mouth every 6 (six) hours. 06/21/22   [provider]  lisinopril (ZESTRIL) 5 MG tablet Take 5 mg by mouth daily.    [provider]  metFORMIN (GLUCOPHAGE) 500 MG tablet Take 500 mg by mouth daily with breakfast. 09/16/22   [provider]  Multiple Vitamins-Iron (MULTIVITAMIN/IRON PO) Take 1 tablet by mouth daily.     [provider]  [facility] ULTRA test strip 1 each 2 (two) times daily. 03/24/20   [provider]  simvastatin (ZOCOR) 20 MG tablet Take 20 mg by mouth daily.  10/14/15   [provider]  spironolactone (ALDACTONE) 25 MG tablet TAKE 1 TABLET (25 MG TOTAL) BY MOUTH DAILY. 04/03/22   Dyann Kief, PA-C  traZODone (DESYREL) 50 MG tablet Take 50 mg by mouth at bedtime. 01/29/20   [provider]  VISINE 0.05 % ophthalmic solution SMARTSIG:1 Drop(s) In Eye(s) PRN 06/13/21   [provider]  zolpidem (AMBIEN) 10 MG tablet Take 10 mg by mouth at bedtime. 07/12/22   [provider]      Allergies    Other and Peanut-containing drug products    Review of Systems   Review of Systems  Respiratory:  Positive for shortness of breath.   All other systems reviewed and are negative.  Physical Exam Updated Vital Signs BP 119/82   Pulse (!) 102   Temp 97.7 F (36.5 C)   Resp (!) 22   Ht 1.702 m (5\' 7" )   Wt 88.3 kg   SpO2 94%   BMI 30.49 kg/m  Physical Exam Vitals and nursing note reviewed.  Constitutional:      General: He is not in acute distress.    Appearance: He is well-developed.  HENT:     Head: Normocephalic and atraumatic.     Mouth/Throat:     Pharynx: No oropharyngeal exudate.  Eyes:     General: No scleral icterus.       Right eye: No discharge.        Left eye: No discharge.     Conjunctiva/sclera: Conjunctivae normal.     Pupils: Pupils are equal, round, and reactive to light.  Neck:     Thyroid: No  thyromegaly.     Vascular: No JVD.  Cardiovascular:     Rate and Rhythm: Normal rate and regular rhythm.     Heart sounds: Normal heart sounds. No murmur heard.    No friction rub. No gallop.  Pulmonary:     Effort: Pulmonary effort is normal. No respiratory distress.     Breath sounds: Normal breath sounds. No wheezing or rales.  Abdominal:     General: Bowel sounds are normal. There is no distension.     Palpations: Abdomen is soft. There is no mass.     Tenderness: There is no abdominal tenderness.  Musculoskeletal:        General: No tenderness. Normal range of motion.     Cervical back: Normal range of motion and neck supple.     Right lower leg: No edema.     Left lower leg: No edema.  Lymphadenopathy:     Cervical: No cervical adenopathy.  Skin:    General: Skin is warm and dry.     Findings: No erythema or rash.  Neurological:     Mental Status: He is alert.     Coordination: Coordination normal.  Psychiatric:        Behavior: Behavior normal.     ED Results / Procedures / Treatments   Labs (all labs ordered are listed, but only abnormal results are displayed) Labs Reviewed  BASIC METABOLIC PANEL - Abnormal; Notable for the following components:      Result Value   CO2 14 (*)    Glucose, Bld 168 (*)    BUN 28 (*)    Creatinine, Ser 1.63 (*)    Calcium 8.8 (*)    GFR, Estimated 45 (*)    All other components within normal limits  URINALYSIS, ROUTINE W REFLEX MICROSCOPIC - Abnormal; Notable for the following components:   Protein, ur 100 (*)    All other components within normal limits  BRAIN NATRIURETIC PEPTIDE - Abnormal; Notable for the following components:   B Natriuretic Peptide 192.0 (*)    All other components within normal limits  D-DIMER, QUANTITATIVE - Abnormal; Notable for the following components:   D-Dimer, Quant 9.44 (*)    All other components within normal limits  CBG MONITORING, ED - Abnormal; Notable for the following components:    Glucose-Capillary 136 (*)    All other components within normal limits  TROPONIN I (HIGH SENSITIVITY) - Abnormal; Notable for the following components:   Troponin I (High Sensitivity) 89 (*)    All other components within normal limits  CBC  EKG EKG Interpretation Date/Time:  Saturday November 25 2022 19:19:12 EDT Ventricular Rate:  106 PR Interval:    QRS Duration:  91 QT Interval:  356 QTC Calculation: 473 R Axis:   73  Text Interpretation: Junctional tachycardia Low voltage, extremity leads Since last tracing rate slower Confirmed by Eber Hong ([id number]) on 11/25/2022 7:24:45 PM  Radiology CT Angio Chest PE W and/or Wo Contrast  Result Date: 11/25/2022 CLINICAL DATA:  Weakness and shortness of breath. EXAM: CT ANGIOGRAPHY CHEST WITH CONTRAST TECHNIQUE: Multidetector CT imaging of the chest was performed using the standard protocol during bolus administration of intravenous contrast. Multiplanar CT image reconstructions and MIPs were obtained to evaluate the vascular anatomy. RADIATION DOSE REDUCTION: This exam was performed according to the departmental dose-optimization program which includes automated exposure control, adjustment of the mA and/or kV according to patient size and/or use of iterative reconstruction technique. CONTRAST:  75mL OMNIPAQUE IOHEXOL 350 MG/ML SOLN COMPARISON:  Chest radiograph 11/25/2022 and PET/CT 08/02/2015 FINDINGS: Cardiovascular: Acute pulmonary embolism beginning at the bifurcation of the right main pulmonary artery extending into the right upper, middle, and lower lobe lobar, segmental and subsegmental pulmonary arteries. Additional pulmonary embolism at the bifurcation of the left main pulmonary artery extending into upper, lingular, and lower lobe lobar, segmental and subsegmental branches. There is evidence of right heart strain with RV to LV ratio of 1.25. No pericardial effusion. Mediastinum/Nodes: Trachea and esophagus are unremarkable. No thoracic  adenopathy. Lungs/Pleura: No focal consolidation, pleural effusion, or pneumothorax. Upper Abdomen: No acute abnormality. Musculoskeletal: No acute fracture. Review of the MIP images confirms the above findings. IMPRESSION: Extensive bilateral acute pulmonary emboli. CT evidence of right heart strain (RV/LV Ratio = 1.25) consistent with at least submassive (intermediate risk) PE. Critical Value/emergent results were called by telephone at the time of interpretation on 11/25/2022 at 10:05 Pm to provider [facility] , who verbally acknowledged these results. Electronically Signed   By: Minerva Fester M.D.   On: 11/25/2022 22:06   DG Chest 2 View  Result Date: 11/25/2022 CLINICAL DATA:  Short of breath EXAM: CHEST - 2 VIEW COMPARISON:  04/13/2013 FINDINGS: The heart size and mediastinal contours are within normal limits. Both lungs are clear. The visualized skeletal structures are unremarkable. IMPRESSION: No active cardiopulmonary disease. Electronically Signed   By: Sharlet Salina M.D.   On: 11/25/2022 19:56    Procedures .Critical Care  Performed by: Eber Hong, MD Authorized by: Eber Hong, MD   Critical care provider statement:    Critical care time (minutes):  45   Critical care time was exclusive of:  Separately billable procedures and treating other patients   Critical care was necessary to treat or prevent imminent or life-threatening deterioration of the following conditions:  Respiratory failure   Critical care was time spent personally by me on the following activities:  Development of treatment plan with patient or surrogate, discussions with consultants, evaluation of patient's response to treatment, examination of patient, obtaining history from patient or surrogate, review of old charts, re-evaluation of patient's condition, pulse oximetry, ordering and review of radiographic studies, ordering and review of laboratory studies and ordering and performing treatments and  interventions   I assumed direction of critical care for this patient from another provider in my specialty: no     Care discussed with: admitting provider   Comments:           Medications Ordered in ED Medications  iohexol (OMNIPAQUE) 350 MG/ML injection 75 mL (75 mLs  Intravenous Contrast Given 11/25/22 2121)    ED Course/ Medical Decision Making/ A&P                             Medical Decision Making Amount and/or Complexity of Data Reviewed Labs: ordered. Radiology: ordered.  Risk Prescription drug management. Decision regarding hospitalization.   The patient's exam shows that he has no rales, no wheezing, no edema, no obvious JVD.  He is tachycardic with a heart rate of around 110 bpm in the supine position.  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  Would also be concerned about recurrent CHF.  The patient is agreeable to the plan.  He is normotensive at this time  Co morbidities that complicate the patient evaluation  Smoldering myeloma, heart failure   Additional history obtained:  Additional history obtained from medical record External records from outside source obtained and reviewed including prior echocardiograms   Lab Tests:  I Ordered, and personally interpreted labs.  The pertinent results include: Creatinine of 1.63, baseline is about that, CBC without any acute findings   Imaging Studies ordered:  I ordered imaging studies including CT scan of the chest with contrast I independently visualized and interpreted imaging which showed large bilateral pulmonary emboli with right heart strain I agree with the radiologist interpretation   Cardiac Monitoring: / EKG:  The patient was maintained on a cardiac monitor.  I personally viewed and interpreted the cardiac monitored which showed an underlying rhythm of: Sinus  tachycardia   Consultations Obtained:  I requested consultation with the critical care intensivist Dr. Katrinka Blazing,  and discussed lab and imaging findings as well as pertinent plan - they recommend: Admission to the hospital locally to the hospitalist with heparin   Problem List / ED Course / Critical interventions / Medication management  Patient is critically ill with what appears to be bilateral PE I ordered medication including heparin for pulmonary embolism Reevaluation of the patient after these medicines showed that the patient critically ill I have reviewed the patients home medicines and have made adjustments as needed   Social Determinants of Health:  History of cancer   Test / Admission - Considered:  Admit to higher level of care         Final Clinical Impression(s) / ED Diagnoses Final diagnoses:  Acute pulmonary embolism with acute cor pulmonale, unspecified pulmonary embolism type [facility])    Rx / DC Orders ED Discharge Orders     None         Eber Hong, MD 11/25/22 2213

## 2022-11-25 NOTE — H&P (Addendum)
History and Physical    Patient: Corey Murphy ZOX:[id number] DOB: 1951-04-06 DOA: 11/25/2022 DOS: the patient was seen and examined on 11/26/2022 PCP: Elfredia Nevins, MD  Patient coming from: Home  Chief Complaint:  Chief Complaint  Patient presents with   Shortness of Breath   HPI: Corey Murphy is a 72 y.o. male with medical history significant of hypertension, hyperlipidemia, smoldering myeloma, chronic diastolic CHF, T2DM who presents to the emergency department due to several days of onset of shortness of breath which has progressively worsened.  Patient usually goes to workout and normally walks 40 to 60 minutes on treadmill about 5 times a week at the gym, but when he went on Wednesday (7/24), he had to return home due to shortness of breath after walking from the parking lot to the gym.  He also complained of shortness of breath after walking up the stairs at home which is unusual for him.  Patient checked his heart rate today and noted the heart rate increasing to 130s on second check, it was initially in the 90s.  He decided to go to the ED for further evaluation and management.  ED Course:  In the emergency department, 140/103, pulse 118 other vital signs are within normal range.  Workup in the ED showed normal CBC and BMP except for bicarb of 14, blood glucose 168, BUN/creatinine 28/1.63 (baseline creatinine at 1.3-1.6).  D-dimer 9.44, troponin 89, BNP 192, urinalysis was normal. CT angiography chest with contrast showed extensive bilateral acute pulmonary emboli. CT evidence of right heart strain (RV/LV Ratio = 1.25) consistent with at least submassive (intermediate risk) PE. Chest x-ray showed no active cardiopulmonary disease Pulmonary critical care physician (Dr. Katrinka Blazing, Reuel Boom) was consulted and recommended admitting patient to [facility] since no indication for any intervention at this time and to start patient on IV heparin for 24 hours prior to switching to DOAC. Patient was started  on IV heparin drip, hospitalist was asked to admit patient for further evaluation and management.  Review of Systems: Review of systems as noted in the HPI. All other systems reviewed and are negative.   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  Diminutive adenomatous polyp in the mid-right colon, otherwise normal   COLONOSCOPY N/A 06/19/2012   Procedure: COLONOSCOPY;  Surgeon: Corbin Ade, MD;  Location: [facility];  Service: Endoscopy;  Laterality: N/A;  8:30AM-rescheduled 10:30am [name] notified pt   COLONOSCOPY N/A 08/08/2017   Surgeon: Corbin Ade, MD;  diverticulosis in sigmoid, descending, and transverse colon, three 4-7 mm polyps in ascending colon, and one 9 mm polyp in ascending colon. Pathology with tubular adenomas. Repeat in 3 years.   COLONOSCOPY N/A 04/23/2019   Sigmoid and descending colon diverticulosis, one 5 mm polyp (tubular adenoma) in descending colon, non-bleeding internal hemorrhoids. 5 year surveillance.    Ileocolonoscopy  06/28/2007  NWG:NFAOZH rectum/Submucosal sigmoid diverticula (chronic), doubt clinical significance Hyperplastic polypoid-appearing fold, mid ascending colon, status/Remainder colonic mucosa and terminal ileum mucosa appeared normal    LEFT AND RIGHT HEART CATHETERIZATION WITH CORONARY ANGIOGRAM  03/19/2013   Procedure: LEFT AND RIGHT HEART CATHETERIZATION WITH CORONARY ANGIOGRAM;   Surgeon: Iran Ouch, MD;  Location: [facility]ATH [facility];  Service: Cardiovascular;;   POLYPECTOMY  08/08/2017   Procedure: POLYPECTOMY;  Surgeon: Corbin Ade, MD;  Location: [facility];  Service: Endoscopy;;  ascending colon polyps x4 (cold snare-3,hot snare)   TOTAL HIP ARTHROPLASTY  2010   Left   TOTAL HIP ARTHROPLASTY  2010   Right    Social History:  reports that he quit smoking about 14 years ago. His smoking use included cigarettes. He started smoking about 26 years ago. He has a 12 pack-year smoking history. He has never used smokeless tobacco. He reports current alcohol use. He reports that he does not currently use drugs after having used the following drugs: Marijuana.   Allergies  Allergen Reactions   Other    Peanut-Containing Drug Products Rash    *Cashews    Family History  Problem Relation Age of Onset   Heart failure Mother    Diabetes Mother    Hypertension Mother    Heart failure Sister    Hypertension Sister    Cancer Sister    Colon cancer Neg Hx      Prior to Admission medications   Medication Sig Start Date End Date Taking? Authorizing Provider  aspirin 81 MG tablet Take 81 mg by mouth daily.    [provider]  carvedilol (COREG) 12.5 MG tablet Take 1 tablet (12.5 mg total) by mouth 2 (two) times daily. 03/10/22 03/10/23  Jonelle Sidle, MD  furosemide (LASIX) 20 MG tablet TAKE 1 TABLET (20 MG TOTAL) BY MOUTH AS NEEDED (FOR WEIGHT GAIN OF 2-3 LBS OVERNIGHT OR 5 LBS IN ONE WEEK). 09/08/22 12/07/22  Jonelle Sidle, MD  glimepiride (AMARYL) 4 MG tablet Take 4 mg by mouth 2 (two) times daily. 03/28/21   [provider]  HYDROcodone-acetaminophen (NORCO) 10-325 MG tablet Take 1 tablet by mouth every 6 (six) hours. 06/21/22   [provider]  lisinopril (ZESTRIL) 5 MG tablet Take 5 mg by mouth daily.    [provider]  metFORMIN (GLUCOPHAGE) 500 MG tablet Take 500 mg by mouth daily with breakfast. 09/16/22   [provider]  Multiple Vitamins-Iron (MULTIVITAMIN/IRON PO) Take 1 tablet by mouth daily.     [provider]  [facility] ULTRA test strip 1 each 2 (two) times daily. 03/24/20   [provider]  simvastatin (ZOCOR) 20 MG tablet Take 20 mg by mouth daily.  10/14/15   [provider]  spironolactone (ALDACTONE) 25 MG tablet TAKE 1 TABLET (25 MG TOTAL) BY MOUTH DAILY. 04/03/22   Dyann Kief, PA-C  traZODone (DESYREL) 50 MG tablet Take 50 mg by mouth at bedtime. 01/29/20   [provider]  VISINE 0.05 % ophthalmic solution SMARTSIG:1 Drop(s) In Eye(s) PRN 06/13/21   [provider]  zolpidem (AMBIEN) 10 MG tablet Take 10 mg by mouth at bedtime. 07/12/22   [provider]    Physical Exam: BP 121/61   Pulse 73   Temp 97.7 F (36.5 C)   Resp 18   Ht 5\' 7"  (1.702 m)   Wt 85.6 kg   SpO2 98%   BMI 29.56 kg/m   General: 72  y.o. year-old male well developed well nourished in no acute distress.  Alert and oriented x3. HEENT: NCAT, EOMI Neck: Supple, trachea medial Cardiovascular: Regular rate and rhythm with no rubs or gallops.  No thyromegaly or JVD noted.  No lower extremity edema. 2/4 pulses in all 4 extremities. Respiratory: Clear to auscultation with no wheezes or rales. Good inspiratory effort. Abdomen: Soft, nontender nondistended with normal bowel sounds x4 quadrants. Muskuloskeletal: No cyanosis, clubbing or edema noted bilaterally Neuro: CN II-XII intact, strength 5/5 x 4, sensation, reflexes intact Skin: No ulcerative lesions noted or rashes Psychiatry: Judgement and insight appear normal. Mood is appropriate for condition and setting          Labs on Admission:  Basic Metabolic Panel: Recent Labs  Lab 11/25/22 1915  NA 135  K 4.3  CL 111  CO2 14*  GLUCOSE 168*  BUN 28*  CREATININE 1.63*  CALCIUM 8.8*   Liver Function Tests: No results for input(s): "AST", "ALT", "ALKPHOS", "BILITOT", "PROT", "ALBUMIN" in the last  168 hours. No results for input(s): "LIPASE", "AMYLASE" in the last 168 hours. No results for input(s): "AMMONIA" in the last 168 hours. CBC: Recent Labs  Lab 11/25/22 1915  WBC 6.8  HGB 15.2  HCT 45.1  MCV 90.9  PLT 228   Cardiac Enzymes: No results for input(s): "CKTOTAL", "CKMB", "CKMBINDEX", "TROPONINI" in the last 168 hours.  BNP (last 3 results) Recent Labs    11/25/22 1915  BNP 192.0*    ProBNP (last 3 results) No results for input(s): "PROBNP" in the last 8760 hours.  CBG: Recent Labs  Lab 11/25/22 1901 11/25/22 2337 11/26/22 0130  GLUCAP 136* 67* 109*    Radiological Exams on Admission: CT Angio Chest PE W and/or Wo Contrast  Result Date: 11/25/2022 CLINICAL DATA:  Weakness and shortness of breath. EXAM: CT ANGIOGRAPHY CHEST WITH CONTRAST TECHNIQUE: Multidetector CT imaging of the chest was performed using the standard protocol during bolus administration of intravenous contrast. Multiplanar CT image reconstructions and MIPs were obtained to evaluate the vascular anatomy. RADIATION DOSE REDUCTION: This exam was performed according to the departmental dose-optimization program which includes automated exposure control, adjustment of the mA and/or kV according to patient size and/or use of iterative reconstruction technique. CONTRAST:  75mL OMNIPAQUE IOHEXOL 350 MG/ML SOLN COMPARISON:  Chest radiograph 11/25/2022 and PET/CT 08/02/2015 FINDINGS: Cardiovascular: Acute pulmonary embolism beginning at the bifurcation of the right main pulmonary artery extending into the right upper, middle, and lower lobe lobar, segmental and subsegmental pulmonary arteries. Additional pulmonary embolism at the bifurcation of the left main pulmonary artery extending into upper, lingular, and lower lobe lobar, segmental and subsegmental branches. There is evidence of right heart strain with RV to LV ratio of 1.25. No pericardial effusion. Mediastinum/Nodes: Trachea and esophagus are  unremarkable. No thoracic adenopathy. Lungs/Pleura: No focal consolidation, pleural effusion, or pneumothorax. Upper Abdomen: No acute abnormality. Musculoskeletal: No acute fracture. Review of the MIP images confirms the above findings. IMPRESSION: Extensive bilateral acute pulmonary emboli. CT evidence of right heart strain (RV/LV Ratio = 1.25) consistent with at least submassive (intermediate risk) PE. Critical Value/emergent results were called by telephone at the time of interpretation on 11/25/2022 at 10:05 Pm to provider [facility] , who verbally acknowledged these results. Electronically Signed   By: Minerva Fester M.D.   On: 11/25/2022 22:06   DG Chest 2 View  Result Date: 11/25/2022 CLINICAL DATA:  Short of breath EXAM: CHEST - 2 VIEW COMPARISON:  04/13/2013 FINDINGS: The  heart size and mediastinal contours are within normal limits. Both lungs are clear. The visualized skeletal structures are unremarkable. IMPRESSION: No active cardiopulmonary disease. Electronically Signed   By: Sharlet Salina M.D.   On: 11/25/2022 19:56    EKG: I independently viewed the EKG done and my findings are as followed: Sinus ectopic atrial rhythm at rate of 81 bpm with QTc of 532 ms  Assessment/Plan Present on Admission:  Acute pulmonary embolism (HCC)  Smoldering myeloma  Essential hypertension, benign  CKD (chronic kidney disease) stage 3, GFR 30-59 ml/min (HCC)  Uncontrolled type 2 diabetes mellitus with hyperglycemia, without long-term current use of insulin (HCC)  Principal Problem:   Acute pulmonary embolism (HCC) Active Problems:   Essential hypertension, benign   Nonischemic cardiomyopathy (HCC)   Uncontrolled type 2 diabetes mellitus with hyperglycemia, without long-term current use of insulin (HCC)   CKD (chronic kidney disease) stage 3, GFR 30-59 ml/min (HCC)   Smoldering myeloma   Elevated troponin   Mixed hyperlipidemia  Acute pulmonary embolism Patient presented with shortness of  breath D-dimer was 9.44 CT angiography chest with contrast showed extensive bilateral acute pulmonary emboli. Patient was started on IV heparin drip for 24 hours with plan to transition to DOAC in the morning Bilateral lower extremity ultrasound will be done in the morning Echocardiogram will be done in the morning  Elevated troponin possible secondary to type II demand ischemia Troponin 89, patient denies any chest pain, continue to trend troponin  Elevated BNP BNP 192 Chest x-ray showed no active cardiopulmonary disease Continue total input/output, daily weights and fluid restriction Continue heart healthy/modified carb diet  Echocardiogram in the morning   Prolonged QT interval QTc Avoid QT prolonging drugs Magnesium level will be checked Repeat EKG in the morning  Smoldering myeloma Stable.  Patient follows with Dr. Ellin Saba  Chronic kidney disease stage IIIa BUN/creatinine 28/1.63 (baseline creatinine at 1.3-1.6) Renally adjust medications, avoid nephrotoxic agents/dehydration/hypotension  Essential hypertension Continue Coreg, lisinopril  Mixed hyperlipidemia Continue Zocor  Type 2 diabetes mellitus with hyperglycemia Continue ISS and hypoglycemia protocol Metformin and glimepiride will be held at this time  History of diastolic CHF Nonischemic cardiomyopathy Continue home meds  DVT prophylaxis: Heparin drip  Advance Care Planning: Full code  Consults: None  Family Communication: Wife at bedside (all questions answered to satisfaction)  Severity of Illness: The appropriate patient status for this patient is INPATIENT. Inpatient status is judged to be reasonable and necessary in order to provide the required intensity of service to ensure the patient's safety. The patient's presenting symptoms, physical exam findings, and initial radiographic and laboratory data in the context of their chronic comorbidities is felt to place them at high risk for  further clinical deterioration. Furthermore, it is not anticipated that the patient will be medically stable for discharge from the hospital within 2 midnights of admission.   * I certify that at the point of admission it is my clinical judgment that the patient will require inpatient hospital care spanning beyond 2 midnights from the point of admission due to high intensity of service, high risk for further deterioration and high frequency of surveillance required.*  Author: Frankey Shown, DO 11/26/2022 1:42 AM  For on call review [email or web address].

## 2022-11-26 ENCOUNTER — Inpatient Hospital Stay (HOSPITAL_COMMUNITY): Payer: Medicare HMO

## 2022-11-26 DIAGNOSIS — R7989 Other specified abnormal findings of blood chemistry: Secondary | ICD-10-CM | POA: Insufficient documentation

## 2022-11-26 DIAGNOSIS — I2602 Saddle embolus of pulmonary artery with acute cor pulmonale: Secondary | ICD-10-CM | POA: Diagnosis not present

## 2022-11-26 DIAGNOSIS — I2699 Other pulmonary embolism without acute cor pulmonale: Secondary | ICD-10-CM | POA: Diagnosis not present

## 2022-11-26 DIAGNOSIS — E782 Mixed hyperlipidemia: Secondary | ICD-10-CM | POA: Insufficient documentation

## 2022-11-26 LAB — GLUCOSE, CAPILLARY
Glucose-Capillary: 109 mg/dL — ABNORMAL HIGH (ref 70–99)
Glucose-Capillary: 97 mg/dL (ref 70–99)
Glucose-Capillary: 162 mg/dL — ABNORMAL HIGH (ref 70–99)
Glucose-Capillary: 84 mg/dL (ref 70–99)
Glucose-Capillary: 83 mg/dL (ref 70–99)

## 2022-11-26 LAB — ECHOCARDIOGRAM COMPLETE
Area-P 1/2: 4.17 cm2
Height: 68 in
S' Lateral: 2.9 cm
Weight: 3019.42 oz

## 2022-11-26 LAB — COMPREHENSIVE METABOLIC PANEL
ALT: 18 U/L (ref 0–44)
AST: 12 U/L — ABNORMAL LOW (ref 15–41)
Albumin: 3.3 g/dL — ABNORMAL LOW (ref 3.5–5.0)
Alkaline Phosphatase: 75 U/L (ref 38–126)
Anion gap: 8 (ref 5–15)
BUN: 24 mg/dL — ABNORMAL HIGH (ref 8–23)
CO2: 20 mmol/L — ABNORMAL LOW (ref 22–32)
Calcium: 8.4 mg/dL — ABNORMAL LOW (ref 8.9–10.3)
Chloride: 107 mmol/L (ref 98–111)
Creatinine, Ser: 1.45 mg/dL — ABNORMAL HIGH (ref 0.61–1.24)
GFR, Estimated: 52 mL/min — ABNORMAL LOW (ref >60)
Glucose, Bld: 85 mg/dL (ref 70–99)
Potassium: 3.7 mmol/L (ref 3.5–5.1)
Sodium: 135 mmol/L (ref 135–145)
Total Bilirubin: 1.2 mg/dL (ref 0.3–1.2)
Total Protein: 7.6 g/dL (ref 6.5–8.1)

## 2022-11-26 LAB — HIV ANTIBODY (ROUTINE TESTING W REFLEX): HIV Screen 4th Generation wRfx: NONREACTIVE

## 2022-11-26 LAB — HEPARIN LEVEL (UNFRACTIONATED)
Heparin Unfractionated: 0.2 [IU]/mL — ABNORMAL LOW (ref 0.30–0.70)
Heparin Unfractionated: 0.51 IU/mL (ref 0.30–0.70)
Heparin Unfractionated: 0.79 IU/mL — ABNORMAL HIGH (ref 0.30–0.70)

## 2022-11-26 LAB — CBC
HCT: 42.2 % (ref 39.0–52.0)
Hemoglobin: 14.1 g/dL (ref 13.0–17.0)
MCH: 30.7 pg (ref 26.0–34.0)
MCHC: 33.4 g/dL (ref 30.0–36.0)
MCV: 91.9 fL (ref 80.0–100.0)
Platelets: 202 10*3/uL (ref 150–400)
RBC: 4.59 MIL/uL (ref 4.22–5.81)
RDW: 14.1 % (ref 11.5–15.5)
WBC: 6.6 10*3/uL (ref 4.0–10.5)
nRBC: 0 % (ref 0.0–0.2)

## 2022-11-26 LAB — PHOSPHORUS: Phosphorus: 3.7 mg/dL (ref 2.5–4.6)

## 2022-11-26 LAB — MAGNESIUM: Magnesium: 2 mg/dL (ref 1.7–2.4)

## 2022-11-26 LAB — TROPONIN I (HIGH SENSITIVITY)
Troponin I (High Sensitivity): 60 ng/L — ABNORMAL HIGH (ref <18)
Troponin I (High Sensitivity): 62 ng/L — ABNORMAL HIGH (ref <18)

## 2022-11-26 LAB — MRSA NEXT GEN BY PCR, NASAL: MRSA by PCR Next Gen: NOT DETECTED

## 2022-11-26 MED ORDER — ZOLPIDEM TARTRATE 5 MG PO TABS
5.0000 mg | ORAL_TABLET | Freq: Every day | ORAL | Status: DC
  Administered 2022-11-26: 5 mg via ORAL
  Filled 2022-11-26: qty 1

## 2022-11-26 MED ORDER — TRAZODONE HCL 50 MG PO TABS
75.0000 mg | ORAL_TABLET | Freq: Every day | ORAL | Status: DC
  Administered 2022-11-26: 75 mg via ORAL
  Filled 2022-11-26: qty 2

## 2022-11-26 MED ORDER — ONDANSETRON HCL 4 MG PO TABS: 4.0000 mg | ORAL_TABLET | Freq: Four times a day (QID) | ORAL | Status: DC | PRN

## 2022-11-26 MED ORDER — ACETAMINOPHEN 650 MG RE SUPP: 650.0000 mg | Freq: Four times a day (QID) | RECTAL | Status: DC | PRN

## 2022-11-26 MED ORDER — LISINOPRIL 5 MG PO TABS
5.0000 mg | ORAL_TABLET | Freq: Every day | ORAL | Status: DC
  Administered 2022-11-26 – 2022-11-27 (×2): 5 mg via ORAL
  Filled 2022-11-26 (×2): qty 1

## 2022-11-26 MED ORDER — ASPIRIN 81 MG PO CHEW
81.0000 mg | CHEWABLE_TABLET | Freq: Every day | ORAL | Status: DC
  Administered 2022-11-26 – 2022-11-27 (×2): 81 mg via ORAL
  Filled 2022-11-26 (×2): qty 1

## 2022-11-26 MED ORDER — SIMVASTATIN 20 MG PO TABS
20.0000 mg | ORAL_TABLET | Freq: Every day | ORAL | Status: DC
  Administered 2022-11-26 – 2022-11-27 (×2): 20 mg via ORAL
  Filled 2022-11-26 (×2): qty 1

## 2022-11-26 MED ORDER — INSULIN ASPART 100 UNIT/ML IJ SOLN
0.0000 [IU] | Freq: Three times a day (TID) | INTRAMUSCULAR | Status: DC
  Administered 2022-11-26: 3 [IU] via SUBCUTANEOUS

## 2022-11-26 MED ORDER — ONDANSETRON HCL 4 MG/2ML IJ SOLN: 4.0000 mg | Freq: Four times a day (QID) | INTRAMUSCULAR | Status: DC | PRN

## 2022-11-26 MED ORDER — CARVEDILOL 12.5 MG PO TABS
12.5000 mg | ORAL_TABLET | Freq: Two times a day (BID) | ORAL | Status: DC
  Administered 2022-11-26 – 2022-11-27 (×3): 12.5 mg via ORAL
  Filled 2022-11-26 (×3): qty 1

## 2022-11-26 MED ORDER — ACETAMINOPHEN 325 MG PO TABS: 650.0000 mg | ORAL_TABLET | Freq: Four times a day (QID) | ORAL | Status: DC | PRN

## 2022-11-26 NOTE — Progress Notes (Signed)
PROGRESS NOTE    Corey Murphy  KGM:[id number] DOB: [birth year] DOA: 11/25/2022 PCP: Elfredia Nevins, MD   Brief Narrative:    Corey Murphy is a 72 y.o. male with medical history significant of hypertension, hyperlipidemia, smoldering myeloma, chronic diastolic CHF, T2DM who presents to the emergency department due to several days of onset of shortness of breath which has progressively worsened.  Patient was admitted with acute pulmonary embolism with evidence of RV strain.  Ultrasound DVT negative for blood clots and 2D echocardiogram currently pending.  Assessment & Plan:   Principal Problem:   Acute pulmonary embolism (HCC) Active Problems:   Essential hypertension, benign   Nonischemic cardiomyopathy (HCC)   Uncontrolled type 2 diabetes mellitus with hyperglycemia, without long-term current use of insulin (HCC)   CKD (chronic kidney disease) stage 3, GFR 30-59 ml/min (HCC)   Smoldering myeloma   Elevated troponin   Mixed hyperlipidemia  Assessment and Plan:  Acute pulmonary embolism Patient presented with shortness of breath D-dimer was 9.44 CT angiography chest with contrast showed extensive bilateral acute pulmonary emboli. Patient was started on IV heparin drip for 24 hours with plan to transition to DOAC in the morning Bilateral lower extremity ultrasound negative for DVT 2D echocardiogram pending   Elevated troponin possible secondary to type II demand ischemia Troponin 89, patient denies any chest pain, continue to trend troponin   Elevated BNP BNP 192 Chest x-ray showed no active cardiopulmonary disease Continue total input/output, daily weights and fluid restriction Continue heart healthy/modified carb diet       Echocardiogram in the morning    Prolonged QT interval QTc Avoid QT prolonging drugs Magnesium level will be checked Repeat EKG in the morning   Smoldering myeloma Stable.  Patient follows with Dr. Ellin Saba   Chronic kidney disease  stage IIIa BUN/creatinine 28/1.63 (baseline creatinine at 1.3-1.6) Renally adjust medications, avoid nephrotoxic agents/dehydration/hypotension   Essential hypertension Continue Coreg, lisinopril   Mixed hyperlipidemia Continue Zocor   Type 2 diabetes mellitus with hyperglycemia Continue ISS and hypoglycemia protocol Metformin and glimepiride will be held at this time   History of diastolic CHF Nonischemic cardiomyopathy Continue home meds  DVT prophylaxis:Heparin drip Code Status: Full Family Communication: Wife at bedside 7/28 Disposition Plan:  Status is: Inpatient Remains inpatient appropriate because: Need for IV medication.   Consultants:  Case discussed with PCCM Dr. Katrinka Blazing  Procedures:  None  Antimicrobials:  None   Subjective: Patient seen and evaluated today with no new acute complaints or concerns. No acute concerns or events noted overnight.  He denies any chest pain or shortness of breath.  Objective: Vitals:   11/26/22 0934 11/26/22 1000 11/26/22 1100 11/26/22 1200  BP: 124/77 130/76 113/78   Pulse: 72 78 87   Resp:  16 20   Temp:    98.7 F (37.1 C)  TempSrc:    Oral  SpO2:  97% 98%   Weight:      Height:        Intake/Output Summary (Last 24 hours) at 11/26/2022 1248 Last data filed at 11/26/2022 1058 Gross per 24 hour  Intake 290.83 ml  Output 1100 ml  Net -809.17 ml   Filed Weights   11/25/22 1857 11/26/22 0008 11/26/22 0800  Weight: 88.3 kg 85.6 kg 85.6 kg    Examination:  General exam: Appears calm and comfortable  Respiratory system: Clear to auscultation. Respiratory effort normal. Cardiovascular system: S1 & S2 heard, RRR.  Gastrointestinal system: Abdomen is soft Central nervous system: Alert  and awake Extremities: No edema Skin: No significant lesions noted Psychiatry: Flat affect.    Data Reviewed: I have personally reviewed following labs and imaging studies  CBC: Recent Labs  Lab 11/25/22 1915 11/26/22 0711   WBC 6.8 6.6  HGB 15.2 14.1  HCT 45.1 42.2  MCV 90.9 91.9  PLT 228 202   Basic Metabolic Panel: Recent Labs  Lab 11/25/22 1915 11/26/22 0711  NA 135 135  K 4.3 3.7  CL 111 107  CO2 14* 20*  GLUCOSE 168* 85  BUN 28* 24*  CREATININE 1.63* 1.45*  CALCIUM 8.8* 8.4*  MG  --  2.0  PHOS  --  3.7   GFR: Estimated Creatinine Clearance: 50.5 mL/min (A) (by C-G formula based on SCr of 1.45 mg/dL (H)). Liver Function Tests: Recent Labs  Lab 11/26/22 0711  AST 12*  ALT 18  ALKPHOS 75  BILITOT 1.2  PROT 7.6  ALBUMIN 3.3*   No results for input(s): "LIPASE", "AMYLASE" in the last 168 hours. No results for input(s): "AMMONIA" in the last 168 hours. Coagulation Profile: No results for input(s): "INR", "PROTIME" in the last 168 hours. Cardiac Enzymes: No results for input(s): "CKTOTAL", "CKMB", "CKMBINDEX", "TROPONINI" in the last 168 hours. BNP (last 3 results) No results for input(s): "PROBNP" in the last 8760 hours. HbA1C: No results for input(s): "HGBA1C" in the last 72 hours. CBG: Recent Labs  Lab 11/25/22 1901 11/25/22 2337 11/26/22 0130 11/26/22 0745 11/26/22 1135  GLUCAP 136* 67* 109* 97 162*   Lipid Profile: No results for input(s): "CHOL", "HDL", "LDLCALC", "TRIG", "CHOLHDL", "LDLDIRECT" in the last 72 hours. Thyroid Function Tests: No results for input(s): "TSH", "T4TOTAL", "FREET4", "T3FREE", "THYROIDAB" in the last 72 hours. Anemia Panel: No results for input(s): "VITAMINB12", "FOLATE", "FERRITIN", "TIBC", "IRON", "RETICCTPCT" in the last 72 hours. Sepsis Labs: No results for input(s): "PROCALCITON", "LATICACIDVEN" in the last 168 hours.  Recent Results (from the past 240 hour(s))  MRSA Next Gen by PCR, Nasal     Status: None   Collection Time: 11/25/22 11:32 PM   Specimen: Nasal Mucosa; Nasal Swab  Result Value Ref Range Status   MRSA by PCR Next Gen NOT DETECTED NOT DETECTED Final    Comment: (NOTE) The GeneXpert MRSA Assay (FDA approved for NASAL  specimens only), is one component of a comprehensive MRSA colonization surveillance program. It is not intended to diagnose MRSA infection nor to guide or monitor treatment for MRSA infections. Test performance is not FDA approved in patients less than 58 years old. Performed at [facility], [address] [place], [place] [address]          Radiology Studies: US Venous Img Lower Bilateral (DVT)  Result Date: 11/26/2022 CLINICAL DATA:  Bilateral lower extremity edema EXAM: BILATERAL LOWER EXTREMITY VENOUS DOPPLER ULTRASOUND TECHNIQUE: Gray-scale sonography with graded compression, as well as color Doppler and duplex ultrasound were performed to evaluate the lower extremity deep venous systems from the level of the common femoral vein and including the common femoral, femoral, profunda femoral, popliteal and calf veins including the posterior tibial, peroneal and gastrocnemius veins when visible. The superficial great saphenous vein was also interrogated. Spectral Doppler was utilized to evaluate flow at rest and with distal augmentation maneuvers in the common femoral, femoral and popliteal veins. COMPARISON:  None Available. FINDINGS: RIGHT LOWER EXTREMITY Common Femoral Vein: No evidence of thrombus. Normal compressibility, respiratory phasicity and response to augmentation. Saphenofemoral Junction: No evidence of thrombus. Normal compressibility and flow on color Doppler  imaging. Profunda Femoral Vein: No evidence of thrombus. Normal compressibility and flow on color Doppler imaging. Femoral Vein: No evidence of thrombus. Normal compressibility, respiratory phasicity and response to augmentation. Popliteal Vein: No evidence of thrombus. Normal compressibility, respiratory phasicity and response to augmentation. Calf Veins: No evidence of thrombus. Normal compressibility and flow on color Doppler imaging. Superficial Great Saphenous Vein: No evidence of thrombus. Normal compressibility. Venous  Reflux:  None. Other Findings:  None. LEFT LOWER EXTREMITY Common Femoral Vein: No evidence of thrombus. Normal compressibility, respiratory phasicity and response to augmentation. Saphenofemoral Junction: No evidence of thrombus. Normal compressibility and flow on color Doppler imaging. Profunda Femoral Vein: No evidence of thrombus. Normal compressibility and flow on color Doppler imaging. Femoral Vein: No evidence of thrombus. Normal compressibility, respiratory phasicity and response to augmentation. Popliteal Vein: No evidence of thrombus. Normal compressibility, respiratory phasicity and response to augmentation. Calf Veins: No evidence of thrombus. Normal compressibility and flow on color Doppler imaging. Superficial Great Saphenous Vein: No evidence of thrombus. Normal compressibility. Venous Reflux:  None. Other Findings:  None. IMPRESSION: No evidence of deep venous thrombosis in either lower extremity. Electronically Signed   By: Malachy Moan M.D.   On: 11/26/2022 10:55   CT Angio Chest PE W and/or Wo Contrast  Result Date: 11/25/2022 CLINICAL DATA:  Weakness and shortness of breath. EXAM: CT ANGIOGRAPHY CHEST WITH CONTRAST TECHNIQUE: Multidetector CT imaging of the chest was performed using the standard protocol during bolus administration of intravenous contrast. Multiplanar CT image reconstructions and MIPs were obtained to evaluate the vascular anatomy. RADIATION DOSE REDUCTION: This exam was performed according to the departmental dose-optimization program which includes automated exposure control, adjustment of the Murphy and/or kV according to patient size and/or use of iterative reconstruction technique. CONTRAST:  75mL OMNIPAQUE IOHEXOL 350 MG/ML SOLN COMPARISON:  Chest radiograph 11/25/2022 and PET/CT 08/02/2015 FINDINGS: Cardiovascular: Acute pulmonary embolism beginning at the bifurcation of the right main pulmonary artery extending into the right upper, middle, and lower lobe lobar,  segmental and subsegmental pulmonary arteries. Additional pulmonary embolism at the bifurcation of the left main pulmonary artery extending into upper, lingular, and lower lobe lobar, segmental and subsegmental branches. There is evidence of right heart strain with RV to LV ratio of 1.25. No pericardial effusion. Mediastinum/Nodes: Trachea and esophagus are unremarkable. No thoracic adenopathy. Lungs/Pleura: No focal consolidation, pleural effusion, or pneumothorax. Upper Abdomen: No acute abnormality. Musculoskeletal: No acute fracture. Review of the MIP images confirms the above findings. IMPRESSION: Extensive bilateral acute pulmonary emboli. CT evidence of right heart strain (RV/LV Ratio = 1.25) consistent with at least submassive (intermediate risk) PE. Critical Value/emergent results were called by telephone at the time of interpretation on 11/25/2022 at 10:05 Pm to provider [facility] , who verbally acknowledged these results. Electronically Signed   By: Minerva Fester M.D.   On: 11/25/2022 22:06   DG Chest 2 View  Result Date: 11/25/2022 CLINICAL DATA:  Short of breath EXAM: CHEST - 2 VIEW COMPARISON:  04/13/2013 FINDINGS: The heart size and mediastinal contours are within normal limits. Both lungs are clear. The visualized skeletal structures are unremarkable. IMPRESSION: No active cardiopulmonary disease. Electronically Signed   By: Sharlet Salina M.D.   On: 11/25/2022 19:56        Scheduled Meds:  aspirin  81 mg Oral Daily   carvedilol  12.5 mg Oral BID   Chlorhexidine Gluconate Cloth  6 each Topical Daily   insulin aspart  0-15 Units Subcutaneous TID WC  lisinopril  5 mg Oral Daily   simvastatin  20 mg Oral Daily   Continuous Infusions:  heparin 1,400 Units/hr (11/25/22 2252)     LOS: 1 day    Time spent: 35 minutes    Anndrea Mihelich Hoover Brunette, DO [facility]  If 7PM-7AM, please contact night-coverage [email or web address] 11/26/2022, 12:48 PM

## 2022-11-26 NOTE — TOC CM/SW Note (Signed)
Transition of Care [facility]) - Inpatient Brief Assessment   Patient Details  Name: Corey Murphy MRN: [id number] Date of Birth: 1950/10/01  Transition of Care [facility]) CM/SW Contact:    Villa Herb, LCSWA Phone Number: 11/26/2022, 11:13 AM   Clinical Narrative: Transition of Care Department [facility]) has reviewed patient and no TOC needs have been identified at this time. We will continue to monitor patient advancement through interdisciplinary progression rounds. If new patient transition needs arise, please place a TOC consult.  Transition of Care Asessment: Insurance and Status: Insurance coverage has been reviewed Patient has primary care physician: Yes Home environment has been reviewed: from home Prior level of function:: independent Prior/Current Home Services: No current home services Social Determinants of Health Reivew: SDOH reviewed no interventions necessary Readmission risk has been reviewed: Yes Transition of care needs: no transition of care needs at this time

## 2022-11-26 NOTE — Progress Notes (Signed)
ANTICOAGULATION CONSULT NOTE -   Pharmacy Consult for Heparin  Indication: pulmonary embolus  Allergies  Allergen Reactions   Other    Peanut-Containing Drug Products Rash    *Cashews    Patient Measurements: Height: 5\' 8"  (172.7 cm) Weight: 85.6 kg (188 lb 11.4 oz) IBW/kg (Calculated) : 68.4  Vital Signs: Temp: 98.7 F (37.1 C) (07/28 2000) Temp Source: Oral (07/28 2000) BP: 120/80 (07/28 1900) Pulse Rate: 71 (07/28 1900)  Labs: Recent Labs    11/25/22 1915 11/26/22 0157 11/26/22 0438 11/26/22 0711 11/26/22 1204 11/26/22 2007  HGB 15.2  --   --  14.1  --   --   HCT 45.1  --   --  42.2  --   --   PLT 228  --   --  202  --   --   HEPARINUNFRC  --   --   --  0.20* 0.51 0.79*  CREATININE 1.63*  --   --  1.45*  --   --   TROPONINIHS 89* 60* 62*  --   --   --     Estimated Creatinine Clearance: 50.5 mL/min (A) (by C-G formula based on SCr of 1.45 mg/dL (H)).   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 Found to have new onset pulmonary embolus via CT angio. Starting heparin per pharmacy. CBC good. Noted renal dysfunction. PTA meds reviewed.   Heparin level supratherapeutic at 0.79 on 1400 units/hr. CBC stable. No documentation of bleeding.   Goal of Therapy:  Heparin level 0.3-0.7 units/ml Monitor platelets by anticoagulation protocol: Yes   Plan:  Decrease heparin drip to 1300 units/hr Heparin level in 8 hours and  daily Daily CBC/Heparin level Monitor for bleeding  Thank you for involving pharmacy in the patient's care.   Theotis Burrow, PharmD PGY1 Acute Care Pharmacy Resident  11/26/2022 9:08 PM

## 2022-11-26 NOTE — Plan of Care (Signed)

## 2022-11-26 NOTE — Progress Notes (Signed)
Pt was first assessed by me around 0745 this am. Heparin gtt was unhooked and laying in the bed. It is now running again. It is unknown how long it had been disconnected. MD and pharmacy made aware.

## 2022-11-26 NOTE — Progress Notes (Signed)
ANTICOAGULATION CONSULT NOTE -   Pharmacy Consult for Heparin  Indication: pulmonary embolus  Allergies  Allergen Reactions   Other    Peanut-Containing Drug Products Rash    *Cashews    Patient Measurements: Height: 5\' 8"  (172.7 cm) Weight: 85.6 kg (188 lb 11.4 oz) IBW/kg (Calculated) : 68.4  Vital Signs: Temp: 98.7 F (37.1 C) (07/28 1200) Temp Source: Oral (07/28 1200) BP: 113/78 (07/28 1100) Pulse Rate: 87 (07/28 1100)  Labs: Recent Labs    11/25/22 1915 11/26/22 0157 11/26/22 0438 11/26/22 0711 11/26/22 1204  HGB 15.2  --   --  14.1  --   HCT 45.1  --   --  42.2  --   PLT 228  --   --  202  --   HEPARINUNFRC  --   --   --  0.20* 0.51  CREATININE 1.63*  --   --  1.45*  --   TROPONINIHS 89* 60* 62*  --   --     Estimated Creatinine Clearance: 50.5 mL/min (A) (by C-G formula based on SCr of 1.45 mg/dL (H)).   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 Found to have new onset pulmonary embolus via CT angio. Starting heparin per pharmacy. CBC good. Noted renal dysfunction. PTA meds reviewed.   HL 0.51- therapeutic  CBC WNL  Goal of Therapy:  Heparin level 0.3-0.7 units/ml Monitor platelets by anticoagulation protocol: Yes   Plan:  Continue heparin drip at 1400 units/hr Heparin level in 6-8 hours and daily Daily CBC/Heparin level Monitor for bleeding  Judeth Cornfield, PharmD Clinical Pharmacist 11/26/2022 1:43  PM

## 2022-11-26 NOTE — Progress Notes (Signed)
ANTICOAGULATION CONSULT NOTE -   Pharmacy Consult for Heparin  Indication: pulmonary embolus  Allergies  Allergen Reactions   Other    Peanut-Containing Drug Products Rash    *Cashews    Patient Measurements: Height: 5\' 8"  (172.7 cm) Weight: 85.6 kg (188 lb 11.4 oz) IBW/kg (Calculated) : 68.4  Vital Signs: Temp: 97.5 F (36.4 C) (07/28 0807) Temp Source: Oral (07/28 0807) BP: 90/50 (07/28 0500) Pulse Rate: 68 (07/28 0500)  Labs: Recent Labs    11/25/22 1915 11/26/22 0157 11/26/22 0438 11/26/22 0711  HGB 15.2  --   --  14.1  HCT 45.1  --   --  42.2  PLT 228  --   --  202  HEPARINUNFRC  --   --   --  0.20*  CREATININE 1.63*  --   --  1.45*  TROPONINIHS 89* 60* 62*  --     Estimated Creatinine Clearance: 50.5 mL/min (A) (by C-G formula based on SCr of 1.45 mg/dL (H)).   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 Found to have new onset pulmonary embolus via CT angio. Starting heparin per pharmacy. CBC good. Noted renal dysfunction. PTA meds reviewed.   HL 0.20 but heparin was found unhooked at 0745 and unsure how long disconnected. Will repeat heparin level in a few hours   Goal of Therapy:  Heparin level 0.3-0.7 units/ml Monitor platelets by anticoagulation protocol: Yes   Plan:  Continue heparin drip at 1400 units/hr Heparin level at 1200 Daily CBC/Heparin level Monitor for bleeding  Judeth Cornfield, PharmD Clinical  Pharmacist 11/26/2022 8:58 AM

## 2022-11-27 ENCOUNTER — Other Ambulatory Visit (HOSPITAL_COMMUNITY): Payer: Self-pay

## 2022-11-27 ENCOUNTER — Inpatient Hospital Stay (INDEPENDENT_AMBULATORY_CARE_PROVIDER_SITE_OTHER): Payer: Medicare HMO

## 2022-11-27 ENCOUNTER — Telehealth: Payer: Self-pay | Admitting: *Deleted

## 2022-11-27 DIAGNOSIS — R001 Bradycardia, unspecified: Secondary | ICD-10-CM

## 2022-11-27 DIAGNOSIS — I2699 Other pulmonary embolism without acute cor pulmonale: Secondary | ICD-10-CM | POA: Diagnosis not present

## 2022-11-27 LAB — GLUCOSE, CAPILLARY: Glucose-Capillary: 93 mg/dL (ref 70–99)

## 2022-11-27 MED ORDER — APIXABAN 5 MG PO TABS: ORAL_TABLET | ORAL | 0 refills | Status: DC

## 2022-11-27 MED ORDER — APIXABAN 5 MG PO TABS: 5.0000 mg | ORAL_TABLET | Freq: Two times a day (BID) | ORAL | Status: DC

## 2022-11-27 MED ORDER — APIXABAN 5 MG PO TABS
10.0000 mg | ORAL_TABLET | Freq: Two times a day (BID) | ORAL | Status: DC
  Administered 2022-11-27: 10 mg via ORAL
  Filled 2022-11-27: qty 2

## 2022-11-27 MED ORDER — CARVEDILOL 6.25 MG PO TABS: 6.2500 mg | ORAL_TABLET | Freq: Two times a day (BID) | ORAL | 1 refills | Status: DC

## 2022-11-27 NOTE — Care Management Important Message (Signed)
Important Message  Patient Details  Name: Corey Murphy MRN: [id number] Date of Birth: 09/12/1950   Medicare Important Message Given:  N/A - LOS <3 / Initial given by admissions     Corey Harold 11/27/2022, 10:15 AM

## 2022-11-27 NOTE — Progress Notes (Signed)
ANTICOAGULATION CONSULT NOTE  Pharmacy Consult for Heparin  Indication: pulmonary embolus  Allergies  Allergen Reactions   Other    Peanut-Containing Drug Products Rash    *Cashews    Patient Measurements: Height: 5\' 8"  (172.7 cm) Weight: 85.6 kg (188 lb 11.4 oz) IBW/kg (Calculated) : 68.4  Vital Signs: Temp: 98.9 F (37.2 C) (07/29 0400) Temp Source: Oral (07/29 0400) BP: 77/44 (07/29 0600) Pulse Rate: 57 (07/29 0600)  Labs: Recent Labs    11/25/22 1915 11/25/22 1915 11/26/22 0157 11/26/22 0438 11/26/22 0711 11/26/22 1204 11/26/22 2007 11/27/22 0414  HGB 15.2  --   --   --  14.1  --   --  13.4  HCT 45.1  --   --   --  42.2  --   --  41.1  PLT 228  --   --   --  202  --   --  198  HEPARINUNFRC  --    < >  --   --  0.20* 0.51 0.79* 0.80*  CREATININE 1.63*  --   --   --  1.45*  --   --  1.30*  TROPONINIHS 89*  --  60* 62*  --   --   --   --    < > = values in this interval not displayed.    Estimated Creatinine Clearance: 56.3 mL/min (A) (by C-G formula based on SCr of 1.3 mg/dL (H)).   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 Found to have new onset pulmonary embolus via CT angio. Starting heparin per pharmacy. CBC good. Noted renal dysfunction. PTA meds reviewed.   Heparin level continues to be supratherapeutic at 0.80 on 1300 units/hr. Hemoglobin trending down but still within normal limits. No documentation of bleeding.    Goal of Therapy:  Heparin level 0.3-0.7 units/ml Monitor platelets by anticoagulation protocol: Yes   Plan:  Decrease heparin drip to 1150 units/hr Heparin level daily Daily CBC/Heparin level Monitor for bleeding  Thank you for involving pharmacy in the patient's care.   Sheppard Coil PharmD., BCPS Clinical Pharmacist 11/27/2022 7:50 AM

## 2022-11-27 NOTE — Discharge Summary (Signed)
Physician Discharge Summary  Corey Murphy ZOX:[id number] DOB: [birth year] DOA: 11/25/2022  PCP: Elfredia Nevins, MD  Admit date: 11/25/2022  Discharge date: 11/27/2022  Admitted From:Home  Disposition:  Home  Recommendations for Outpatient Follow-up:  Follow up with PCP in 1-2 weeks Remain on Eliquis as prescribed for PE Remain on cardiac monitor for the next 14 days to evaluate for AV block/bradycardia Referral sent to cardiology for close follow-up regarding concerns for some bradycardia with intermittent AV block during this admission Reduce dose of Coreg and hold lisinopril for now given some hypotension and some low heart rates Follow-up with Dr. Ellin Saba scheduled for myeloma  Home Health: None  Equipment/Devices: Heart rate monitor  Discharge Condition:Stable  CODE STATUS: Full  Diet recommendation: Heart Healthy/carb modified  Brief/Interim Summary: Corey Murphy is a 72 y.o. male with medical history significant of hypertension, hyperlipidemia, smoldering myeloma, chronic diastolic CHF, T2DM who presents to the emergency department due to several days of onset of shortness of breath which has progressively worsened.  Patient was admitted with acute pulmonary embolism with evidence of RV strain.  Ultrasound DVT negative for blood clots and 2D echocardiogram did not demonstrate any findings of significant RV strain.  He has not required any oxygen and appears to be in stable condition for discharge and will be transition from heparin drip to Eliquis today.  He has ambulated without any significant symptomatology, but does have some mild bradycardia at times that is asymptomatic with some intermittent AV block.  He will be sent with heart rate monitor and close cardiology follow-up.  His dose of Coreg has been reduced and lisinopril will be held on account of some hypotension as well as lower heart rates.  No other acute events or concerns noted this admission.  Discharge  Diagnoses:  Principal Problem:   Acute pulmonary embolism (HCC) Active Problems:   Essential hypertension, benign   Nonischemic cardiomyopathy (HCC)   Uncontrolled type 2 diabetes mellitus with hyperglycemia, without long-term current use of insulin (HCC)   CKD (chronic kidney disease) stage 3, GFR 30-59 ml/min (HCC)   Smoldering myeloma   Elevated troponin   Mixed hyperlipidemia  Principal discharge diagnosis: Acute pulmonary embolism-in the setting of smoldering myeloma.  Intermittent bradycardia-asymptomatic.  Discharge Instructions  Discharge Instructions     Ambulatory referral to Cardiology   Complete by: As directed    Diet - low sodium heart healthy   Complete by: As directed    Increase activity slowly   Complete by: As directed       Allergies as of 11/27/2022       Reactions   Other    Peanut-containing Drug Products Rash   *Cashews        Medication List     STOP taking these medications    aspirin 81 MG tablet   lisinopril 5 MG tablet Commonly known as: ZESTRIL       TAKE these medications    apixaban 5 MG Tabs tablet Commonly known as: ELIQUIS Take 2 tablets (10 mg total) by mouth 2 (two) times daily for 7 days, THEN 1 tablet (5 mg total) 2 (two) times daily. Start taking on: November 27, 2022   carvedilol 6.25 MG tablet Commonly known as: COREG Take 1 tablet (6.25 mg total) by mouth 2 (two) times daily. What changed:  medication strength how much to take   furosemide 20 MG tablet Commonly known as: LASIX TAKE 1 TABLET (20 MG TOTAL) BY MOUTH AS NEEDED (FOR WEIGHT GAIN OF  2-3 LBS OVERNIGHT OR 5 LBS IN ONE WEEK).   glimepiride 4 MG tablet Commonly known as: AMARYL Take 4 mg by mouth 2 (two) times daily.   HYDROcodone-acetaminophen 10-325 MG tablet Commonly known as: NORCO Take 1 tablet by mouth every 6 (six) hours.   metFORMIN 500 MG tablet Commonly known as: GLUCOPHAGE Take 250 mg by mouth 2 (two) times daily with a meal.    MULTIVITAMIN/IRON PO Take 1 tablet by mouth daily.   OneTouch Ultra test strip Generic drug: glucose blood 1 each 2 (two) times daily.   simvastatin 20 MG tablet Commonly known as: ZOCOR Take 20 mg by mouth daily.   spironolactone 25 MG tablet Commonly known as: ALDACTONE TAKE 1 TABLET (25 MG TOTAL) BY MOUTH DAILY.   traZODone 50 MG tablet Commonly known as: DESYREL Take 75 mg by mouth at bedtime.   Visine 0.05 % ophthalmic solution Generic drug: tetrahydrozoline SMARTSIG:1 Drop(s) In Eye(s) PRN   zolpidem 10 MG tablet Commonly known as: AMBIEN Take 5 mg by mouth at bedtime.        Follow-up Information     Elfredia Nevins, MD. Schedule an appointment as soon as possible for a visit in 1 week(s).   Specialty: Internal Medicine Contact information: [address] [place] [address] [phone]         Jonelle Sidle, MD. Go to.   Specialty: Cardiology Contact information: [address] [place] [address] [phone]                Allergies  Allergen Reactions   Other    Peanut-Containing Drug Products Rash    *Cashews    Consultations: None   Procedures/Studies: ECHOCARDIOGRAM COMPLETE  Result Date: 11/26/2022    ECHOCARDIOGRAM REPORT   Patient Name:   Corey WOHLER Date of Exam: 11/26/2022 Medical Rec #:  [id number]     Height:       68.0 in Accession #:    [id number]    Weight:       188.7 lb Date of Birth:  01/25/1951     BSA:          1.994 m Patient Age:    72 years      BP:           121/66 mmHg Patient Gender: M             HR:           72 bpm. [facility] Procedure: 2D Echo, Color Doppler and Cardiac Doppler Indications:    I26.02 Pulmonary embolus  History:        Patient has prior history of Echocardiogram examinations, most                 recent 03/21/2022. Risk Factors:Hypertension, Diabetes and                 Dyslipidemia.  Sonographer:    Irving Burton Senior RDCS Referring Phys: [id number] OLADAPO  ADEFESO IMPRESSIONS  1. Left ventricular ejection fraction, by estimation, is 55 to 60%. The left ventricle has normal function. The left ventricle has no regional wall motion abnormalities. There is mild asymmetric left ventricular hypertrophy of the infero-lateral segment. Indeterminate diastolic filling due to E-A fusion.  2. Right ventricular systolic function is mildly reduced. The right ventricular size is mildly enlarged. Tricuspid regurgitation signal is inadequate for assessing PA pressure.  3. Right atrial size was severely dilated.  4. The mitral valve is  normal in structure. Trivial mitral valve regurgitation.  5. The aortic valve is tricuspid. Aortic valve regurgitation is not visualized.  6. The inferior vena cava is normal in size with greater than 50% respiratory variability, suggesting right atrial pressure of 3 mmHg. Comparison(s): Prior images reviewed side by side. FINDINGS  Left Ventricle: Left ventricular ejection fraction, by estimation, is 55 to 60%. The left ventricle has normal function. The left ventricle has no regional wall motion abnormalities. The left ventricular internal cavity size was normal in size. There is  mild asymmetric left ventricular hypertrophy of the infero-lateral segment. Indeterminate diastolic filling due to E-A fusion. Right Ventricle: The right ventricular size is mildly enlarged. No increase in right ventricular wall thickness. Right ventricular systolic function is mildly reduced. Tricuspid regurgitation signal is inadequate for assessing PA pressure. Left Atrium: Left atrial size was normal in size. Right Atrium: Right atrial size was severely dilated. Pericardium: There is no evidence of pericardial effusion. Mitral Valve: The mitral valve is normal in structure. Trivial mitral valve regurgitation. Tricuspid Valve: The tricuspid valve is normal in structure. Tricuspid valve regurgitation is trivial. Aortic Valve: The aortic valve is tricuspid. Aortic valve  regurgitation is not visualized. Pulmonic Valve: The pulmonic valve was normal in structure. Pulmonic valve regurgitation is not visualized. Aorta: The aortic root is normal in size and structure and the ascending aorta was not well visualized. Venous: The inferior vena cava is normal in size with greater than 50% respiratory variability, suggesting right atrial pressure of 3 mmHg. IAS/Shunts: No atrial level shunt detected by color flow Doppler.  LEFT VENTRICLE PLAX 2D LVIDd:         4.20 cm   Diastology LVIDs:         2.90 cm   LV e' medial:    9.57 cm/s LV PW:         1.20 cm   LV E/e' medial:  8.3 LV IVS:        1.00 cm   LV e' lateral:   9.14 cm/s LVOT diam:     2.00 cm   LV E/e' lateral: 8.7 LV SV:         43 LV SV Index:   21 LVOT Area:     3.14 cm  RIGHT VENTRICLE RV S prime:     6.85 cm/s TAPSE (M-mode): 1.7 cm LEFT ATRIUM             Index        RIGHT ATRIUM           Index LA diam:        3.70 cm 1.86 cm/m   RA Area:     29.10 cm LA Vol (A2C):   30.9 ml 15.50 ml/m  RA Volume:   110.00 ml 55.17 ml/m LA Vol (A4C):   39.8 ml 19.96 ml/m LA Biplane Vol: 34.9 ml 17.50 ml/m  AORTIC VALVE LVOT Vmax:   74.40 cm/s LVOT Vmean:  55.000 cm/s LVOT VTI:    0.136 m  AORTA Ao Root diam: 3.20 cm MITRAL VALVE MV Area (PHT): 4.17 cm    SHUNTS MV Decel Time: 182 msec    Systemic VTI:  0.14 m MV E velocity: 79.30 cm/s  Systemic Diam: 2.00 cm Donato Schultz MD Electronically signed by Donato Schultz MD Signature Date/Time: 11/26/2022/2:34:21 PM    Final    US Venous Img Lower Bilateral (DVT)  Result Date: 11/26/2022 CLINICAL DATA:  Bilateral lower extremity edema EXAM: BILATERAL LOWER EXTREMITY  VENOUS DOPPLER ULTRASOUND TECHNIQUE: Gray-scale sonography with graded compression, as well as color Doppler and duplex ultrasound were performed to evaluate the lower extremity deep venous systems from the level of the common femoral vein and including the common femoral, femoral, profunda femoral, popliteal and calf veins  including the posterior tibial, peroneal and gastrocnemius veins when visible. The superficial great saphenous vein was also interrogated. Spectral Doppler was utilized to evaluate flow at rest and with distal augmentation maneuvers in the common femoral, femoral and popliteal veins. COMPARISON:  None Available. FINDINGS: RIGHT LOWER EXTREMITY Common Femoral Vein: No evidence of thrombus. Normal compressibility, respiratory phasicity and response to augmentation. Saphenofemoral Junction: No evidence of thrombus. Normal compressibility and flow on color Doppler imaging. Profunda Femoral Vein: No evidence of thrombus. Normal compressibility and flow on color Doppler imaging. Femoral Vein: No evidence of thrombus. Normal compressibility, respiratory phasicity and response to augmentation. Popliteal Vein: No evidence of thrombus. Normal compressibility, respiratory phasicity and response to augmentation. Calf Veins: No evidence of thrombus. Normal compressibility and flow on color Doppler imaging. Superficial Great Saphenous Vein: No evidence of thrombus. Normal compressibility. Venous Reflux:  None. Other Findings:  None. LEFT LOWER EXTREMITY Common Femoral Vein: No evidence of thrombus. Normal compressibility, respiratory phasicity and response to augmentation. Saphenofemoral Junction: No evidence of thrombus. Normal compressibility and flow on color Doppler imaging. Profunda Femoral Vein: No evidence of thrombus. Normal compressibility and flow on color Doppler imaging. Femoral Vein: No evidence of thrombus. Normal compressibility, respiratory phasicity and response to augmentation. Popliteal Vein: No evidence of thrombus. Normal compressibility, respiratory phasicity and response to augmentation. Calf Veins: No evidence of thrombus. Normal compressibility and flow on color Doppler imaging. Superficial Great Saphenous Vein: No evidence of thrombus. Normal compressibility. Venous Reflux:  None. Other Findings:  None.  IMPRESSION: No evidence of deep venous thrombosis in either lower extremity. Electronically Signed   By: Malachy Moan M.D.   On: 11/26/2022 10:55   CT Angio Chest PE W and/or Wo Contrast  Result Date: 11/25/2022 CLINICAL DATA:  Weakness and shortness of breath. EXAM: CT ANGIOGRAPHY CHEST WITH CONTRAST TECHNIQUE: Multidetector CT imaging of the chest was performed using the standard protocol during bolus administration of intravenous contrast. Multiplanar CT image reconstructions and MIPs were obtained to evaluate the vascular anatomy. RADIATION DOSE REDUCTION: This exam was performed according to the departmental dose-optimization program which includes automated exposure control, adjustment of the mA and/or kV according to patient size and/or use of iterative reconstruction technique. CONTRAST:  75mL OMNIPAQUE IOHEXOL 350 MG/ML SOLN COMPARISON:  Chest radiograph 11/25/2022 and PET/CT 08/02/2015 FINDINGS: Cardiovascular: Acute pulmonary embolism beginning at the bifurcation of the right main pulmonary artery extending into the right upper, middle, and lower lobe lobar, segmental and subsegmental pulmonary arteries. Additional pulmonary embolism at the bifurcation of the left main pulmonary artery extending into upper, lingular, and lower lobe lobar, segmental and subsegmental branches. There is evidence of right heart strain with RV to LV ratio of 1.25. No pericardial effusion. Mediastinum/Nodes: Trachea and esophagus are unremarkable. No thoracic adenopathy. Lungs/Pleura: No focal consolidation, pleural effusion, or pneumothorax. Upper Abdomen: No acute abnormality. Musculoskeletal: No acute fracture. Review of the MIP images confirms the above findings. IMPRESSION: Extensive bilateral acute pulmonary emboli. CT evidence of right heart strain (RV/LV Ratio = 1.25) consistent with at least submassive (intermediate risk) PE. Critical Value/emergent results were called by telephone at the time of  interpretation on 11/25/2022 at 10:05 Pm to provider [facility] , who verbally acknowledged these  results. Electronically Signed   By: Minerva Fester M.D.   On: 11/25/2022 22:06   DG Chest 2 View  Result Date: 11/25/2022 CLINICAL DATA:  Short of breath EXAM: CHEST - 2 VIEW COMPARISON:  04/13/2013 FINDINGS: The heart size and mediastinal contours are within normal limits. Both lungs are clear. The visualized skeletal structures are unremarkable. IMPRESSION: No active cardiopulmonary disease. Electronically Signed   By: Sharlet Salina M.D.   On: 11/25/2022 19:56     Discharge Exam: Vitals:   11/27/22 0800 11/27/22 0855  BP: (!) 105/52 (!) 105/52  Pulse: 66 80  Resp: 19   Temp:    SpO2: 98%    Vitals:   11/27/22 0600 11/27/22 0700 11/27/22 0800 11/27/22 0855  BP: (!) 77/44  (!) 105/52 (!) 105/52  Pulse: (!) 57  66 80  Resp: 15 11 19    Temp:      TempSrc:      SpO2: 94%  98%   Weight:      Height:        General: Pt is alert, awake, not in acute distress Cardiovascular: RRR, S1/S2 +, no rubs, no gallops Respiratory: CTA bilaterally, no wheezing, no rhonchi Abdominal: Soft, NT, ND, bowel sounds + Extremities: no edema, no cyanosis    The results of significant diagnostics from this hospitalization (including imaging, microbiology, ancillary and laboratory) are listed below for reference.     Microbiology: Recent Results (from the past 240 hour(s))  MRSA Next Gen by PCR, Nasal     Status: None   Collection Time: 11/25/22 11:32 PM   Specimen: Nasal Mucosa; Nasal Swab  Result Value Ref Range Status   MRSA by PCR Next Gen NOT DETECTED NOT DETECTED Final    Comment: (NOTE) The GeneXpert MRSA Assay (FDA approved for NASAL specimens only), is one component of a comprehensive MRSA colonization surveillance program. It is not intended to diagnose MRSA infection nor to guide or monitor treatment for MRSA infections. Test performance is not FDA approved in patients less than 96  years old. Performed at [facility], [address], [place], [place] [address]      Labs: BNP (last 3 results) Recent Labs    11/25/22 1915  BNP 192.0*   Basic Metabolic Panel: Recent Labs  Lab 11/25/22 1915 11/26/22 0711 11/27/22 0414  NA 135 135 134*  K 4.3 3.7 3.8  CL 111 107 106  CO2 14* 20* 20*  GLUCOSE 168* 85 135*  BUN 28* 24* 21  CREATININE 1.63* 1.45* 1.30*  CALCIUM 8.8* 8.4* 8.6*  MG  --  2.0 2.0  PHOS  --  3.7  --    Liver Function Tests: Recent Labs  Lab 11/26/22 0711  AST 12*  ALT 18  ALKPHOS 75  BILITOT 1.2  PROT 7.6  ALBUMIN 3.3*   No results for input(s): "LIPASE", "AMYLASE" in the last 168 hours. No results for input(s): "AMMONIA" in the last 168 hours. CBC: Recent Labs  Lab 11/25/22 1915 11/26/22 0711 11/27/22 0414  WBC 6.8 6.6 6.3  HGB 15.2 14.1 13.4  HCT 45.1 42.2 41.1  MCV 90.9 91.9 93.0  PLT 228 202 198   Cardiac Enzymes: No results for input(s): "CKTOTAL", "CKMB", "CKMBINDEX", "TROPONINI" in the last 168 hours. BNP: Invalid input(s): "POCBNP" CBG: Recent Labs  Lab 11/26/22 0745 11/26/22 1135 11/26/22 1529 11/26/22 2127 11/27/22 0725  GLUCAP 97 162* 84 83 93   D-Dimer Recent Labs    11/25/22 1915  DDIMER 9.44*  Hgb A1c Recent Labs    11/26/22 0157  HGBA1C 6.9*   Lipid Profile No results for input(s): "CHOL", "HDL", "LDLCALC", "TRIG", "CHOLHDL", "LDLDIRECT" in the last 72 hours. Thyroid function studies No results for input(s): "TSH", "T4TOTAL", "T3FREE", "THYROIDAB" in the last 72 hours.  Invalid input(s): "FREET3" Anemia work up No results for input(s): "VITAMINB12", "FOLATE", "FERRITIN", "TIBC", "IRON", "RETICCTPCT" in the last 72 hours. Urinalysis    Component Value Date/Time   COLORURINE YELLOW 11/25/2022 1900   APPEARANCEUR CLEAR 11/25/2022 1900   LABSPEC 1.019 11/25/2022 1900   PHURINE 5.0 11/25/2022 1900   GLUCOSEU NEGATIVE 11/25/2022 1900   HGBUR NEGATIVE 11/25/2022 1900   BILIRUBINUR  NEGATIVE 11/25/2022 1900   KETONESUR NEGATIVE 11/25/2022 1900   PROTEINUR 100 (A) 11/25/2022 1900   UROBILINOGEN 0.2 03/23/2009 1325   NITRITE NEGATIVE 11/25/2022 1900   LEUKOCYTESUR NEGATIVE 11/25/2022 1900   Sepsis Labs Recent Labs  Lab 11/25/22 1915 11/26/22 0711 11/27/22 0414  WBC 6.8 6.6 6.3   Microbiology Recent Results (from the past 240 hour(s))  MRSA Next Gen by PCR, Nasal     Status: None   Collection Time: 11/25/22 11:32 PM   Specimen: Nasal Mucosa; Nasal Swab  Result Value Ref Range Status   MRSA by PCR Next Gen NOT DETECTED NOT DETECTED Final    Comment: (NOTE) The GeneXpert MRSA Assay (FDA approved for NASAL specimens only), is one component of a comprehensive MRSA colonization surveillance program. It is not intended to diagnose MRSA infection nor to guide or monitor treatment for MRSA infections. Test performance is not FDA approved in patients less than 26 years old. Performed at [facility], [address], [place], [place] [address]      Time coordinating discharge: 35 minutes  SIGNED:   Erick Blinks, DO [facility] 11/27/2022, 9:42 AM  If 7PM-7AM, please contact night-coverage [email or web address]

## 2022-11-27 NOTE — Discharge Instructions (Signed)
Information on my medicine - ELIQUIS (apixaban)  Why was Eliquis prescribed for you? Eliquis was prescribed to treat blood clots that may have been found in the veins of your legs (deep vein thrombosis) or in your lungs (pulmonary embolism) and to reduce the risk of them occurring again.  What do You need to know about Eliquis ? The starting dose is 10 mg (two 5 mg tablets) taken TWICE daily for the FIRST SEVEN (7) DAYS, then on 12/04/22  the dose is reduced to ONE 5 mg tablet taken TWICE daily.  Eliquis may be taken with or without food.   Try to take the dose about the same time in the morning and in the evening. If you have difficulty swallowing the tablet whole please discuss with your pharmacist how to take the medication safely.  Take Eliquis exactly as prescribed and DO NOT stop taking Eliquis without talking to the doctor who prescribed the medication.  Stopping may increase your risk of developing a new blood clot.  Refill your prescription before you run out.  After discharge, you should have regular check-up appointments with your healthcare provider that is prescribing your Eliquis.    What do you do if you miss a dose? If a dose of ELIQUIS is not taken at the scheduled time, take it as soon as possible on the same day and twice-daily administration should be resumed. The dose should not be doubled to make up for a missed dose.  Important Safety Information A possible side effect of Eliquis is bleeding. You should call your healthcare provider right away if you experience any of the following: Bleeding from an injury or your nose that does not stop. Unusual colored urine (red or dark brown) or unusual colored stools (red or black). Unusual bruising for unknown reasons. A serious fall or if you hit your head (even if there is no bleeding).  Some medicines may interact with Eliquis and might increase your risk of bleeding or clotting while on Eliquis. To help avoid this,  consult your healthcare provider or pharmacist prior to using any new prescription or non-prescription medications, including herbals, vitamins, non-steroidal anti-inflammatory drugs (NSAIDs) and supplements.  This website has more information on Eliquis (apixaban): http://www.eliquis.com/eliquis/home

## 2022-11-27 NOTE — TOC Benefit Eligibility Note (Signed)
Pharmacy Patient Advocate Encounter  Insurance verification completed.    The patient is insured through [employer] test claim for Eliquis and the current 30 day co-pay is $176.11.   This test claim was processed through [facility]- copay amounts may vary at other pharmacies due to pharmacy/plan contracts, or as the patient moves through the different stages of their insurance plan.

## 2022-11-27 NOTE — Telephone Encounter (Signed)
Monitor request for a 14 day Zio monitor. Monitor placed and order entered.

## 2022-12-04 DIAGNOSIS — I2699 Other pulmonary embolism without acute cor pulmonale: Secondary | ICD-10-CM | POA: Diagnosis not present

## 2022-12-04 DIAGNOSIS — I429 Cardiomyopathy, unspecified: Secondary | ICD-10-CM | POA: Diagnosis not present

## 2022-12-04 DIAGNOSIS — I5032 Chronic diastolic (congestive) heart failure: Secondary | ICD-10-CM | POA: Diagnosis not present

## 2022-12-04 DIAGNOSIS — E663 Overweight: Secondary | ICD-10-CM | POA: Diagnosis not present

## 2022-12-04 DIAGNOSIS — C9001 Multiple myeloma in remission: Secondary | ICD-10-CM | POA: Diagnosis not present

## 2022-12-04 DIAGNOSIS — E1140 Type 2 diabetes mellitus with diabetic neuropathy, unspecified: Secondary | ICD-10-CM | POA: Diagnosis not present

## 2022-12-04 DIAGNOSIS — Z6829 Body mass index (BMI) 29.0-29.9, adult: Secondary | ICD-10-CM | POA: Diagnosis not present

## 2022-12-04 DIAGNOSIS — E1129 Type 2 diabetes mellitus with other diabetic kidney complication: Secondary | ICD-10-CM | POA: Diagnosis not present

## 2022-12-04 DIAGNOSIS — N1831 Chronic kidney disease, stage 3a: Secondary | ICD-10-CM | POA: Diagnosis not present

## 2022-12-11 NOTE — Progress Notes (Signed)
Cardiology Office Note:  .   Date:  12/25/2022  ID:  Corey Murphy, DOB Nov 30, 1950, MRN [id number] PCP: Elfredia Nevins, MD  [facility] Providers Cardiologist:  Nona Dell, MD    History of Present Illness: .   Corey Murphy is a 72 y.o. male   with medical history significant of hypertension, hyperlipidemia, smoldering myeloma, chronic diastolic CHF, T2DM, NICM recovered EF 60-65% 03/2022.  Patient discharged 11/27/22 with acute PE. LE ultrasound negative for DVT and echo no RV strain. Now on Eliquis. He had some bradycardia and coreg reduced and lisinopril stopped due to hypotension. Zio monitor   12/19/22 and said he had 8 sec run CHB HR 36 4:27 am and 3 runs V 11:58 am.  Patient says one day working in the yard his HR got up in the 90's but overall he feels like it's been stable. No chest pain, dizziness, palpitations, dizziness or presyncope. No history of sleep apnea but his wife says he snores and sometimes   ROS:    Studies Reviewed: Marland Kitchen         Prior CV Studies: ZIO 11/2022 ZIO monitor reviewed.  13 days, 21 hours analyzed.   Predominant rhythm is sinus with heart rate ranging from 49 bpm up to 145 bpm with average heart rate 76 bpm. There were rare PACs representing less than 1% total beats. There were rare PVCs including ventricular couplets and triplets representing less than 1% total beats.  Also limited episodes of ventricular bigeminy and trigeminy as well as 3 episodes of NSVT, the longest of which was 11 beats.  There were no sustained ventricular events. There was an 8-second episode of complete heart block noted at 4:28 AM on August 12 with narrow complex escape at 36 bpm.  Prolonged PR interval noted at baseline and there were limited brief episodes of second-degree type I Wenckebach conduction as well. No pauses.   ECHO COMPLETE WO IMAGING ENHANCING AGENT 11/26/2022  Narrative ECHOCARDIOGRAM REPORT    Patient Name:   Corey Murphy Date of Exam:  11/26/2022 Medical Rec #:  [id number]     Height:       68.0 in Accession #:    [id number]    Weight:       188.7 lb Date of Birth:  11/27/1950     BSA:          1.994 m Patient Age:    72 years      BP:           121/66 mmHg Patient Gender: M             HR:           72 bpm. [facility]  Procedure: 2D Echo, Color Doppler and Cardiac Doppler  Indications:    I26.02 Pulmonary embolus  History:        Patient has prior history of Echocardiogram examinations, most recent 03/21/2022. Risk Factors:Hypertension, Diabetes and Dyslipidemia.  Sonographer:    Irving Burton Senior RDCS Referring Phys: [id number] OLADAPO ADEFESO  IMPRESSIONS   1. Left ventricular ejection fraction, by estimation, is 55 to 60%. The left ventricle has normal function. The left ventricle has no regional wall motion abnormalities. There is mild asymmetric left ventricular hypertrophy of the infero-lateral segment. Indeterminate diastolic filling due to E-A fusion. 2. Right ventricular systolic function is mildly reduced. The right ventricular size is mildly enlarged. Tricuspid regurgitation signal is inadequate for assessing PA pressure. 3. Right atrial size was severely  dilated. 4. The mitral valve is normal in structure. Trivial mitral valve regurgitation. 5. The aortic valve is tricuspid. Aortic valve regurgitation is not visualized. 6. The inferior vena cava is normal in size with greater than 50% respiratory variability, suggesting right atrial pressure of 3 mmHg.  Comparison(s): Prior images reviewed side by side.  FINDINGS Left Ventricle: Left ventricular ejection fraction, by estimation, is 55 to 60%. The left ventricle has normal function. The left ventricle has no regional wall motion abnormalities. The left ventricular internal cavity size was normal in size. There is mild asymmetric left ventricular hypertrophy of the infero-lateral segment. Indeterminate diastolic filling due to E-A fusion.  Right  Ventricle: The right ventricular size is mildly enlarged. No increase in right ventricular wall thickness. Right ventricular systolic function is mildly reduced. Tricuspid regurgitation signal is inadequate for assessing PA pressure.  Left Atrium: Left atrial size was normal in size.  Right Atrium: Right atrial size was severely dilated.  Pericardium: There is no evidence of pericardial effusion.  Mitral Valve: The mitral valve is normal in structure. Trivial mitral valve regurgitation.  Tricuspid Valve: The tricuspid valve is normal in structure. Tricuspid valve regurgitation is trivial.  Aortic Valve: The aortic valve is tricuspid. Aortic valve regurgitation is not visualized.  Pulmonic Valve: The pulmonic valve was normal in structure. Pulmonic valve regurgitation is not visualized.  Aorta: The aortic root is normal in size and structure and the ascending aorta was not well visualized.  Venous: The inferior vena cava is normal in size with greater than 50% respiratory variability, suggesting right atrial pressure of 3 mmHg.  IAS/Shunts: No atrial level shunt detected by color flow Doppler.   LEFT VENTRICLE PLAX 2D LVIDd:         4.20 cm   Diastology LVIDs:         2.90 cm   LV e' medial:    9.57 cm/s LV PW:         1.20 cm   LV E/e' medial:  8.3 LV IVS:        1.00 cm   LV e' lateral:   9.14 cm/s LVOT diam:     2.00 cm   LV E/e' lateral: 8.7 LV SV:         43 LV SV Index:   21 LVOT Area:     3.14 cm   RIGHT VENTRICLE RV S prime:     6.85 cm/s TAPSE (M-mode): 1.7 cm  LEFT ATRIUM             Index        RIGHT ATRIUM           Index LA diam:        3.70 cm 1.86 cm/m   RA Area:     29.10 cm LA Vol (A2C):   30.9 ml 15.50 ml/m  RA Volume:   110.00 ml 55.17 ml/m LA Vol (A4C):   39.8 ml 19.96 ml/m LA Biplane Vol: 34.9 ml 17.50 ml/m AORTIC VALVE LVOT Vmax:   74.40 cm/s LVOT Vmean:  55.000 cm/s LVOT VTI:    0.136 m  AORTA Ao Root diam: 3.20 cm  MITRAL VALVE MV  Area (PHT): 4.17 cm    SHUNTS MV Decel Time: 182 msec    Systemic VTI:  0.14 m MV E velocity: 79.30 cm/s  Systemic Diam: 2.00 cm  Donato Schultz MD Electronically signed by Donato Schultz MD Signature Date/Time: 11/26/2022/2:34:21 PM    Final  Risk Assessment/Calculations:             Physical Exam:   VS:  BP 116/68   Pulse 72   Ht 5\' 7"  (1.702 m)   Wt 176 lb (79.8 kg)   SpO2 99%   BMI 27.57 kg/m    Wt Readings from Last 3 Encounters:  12/25/22 176 lb (79.8 kg)  11/27/22 188 lb 11.4 oz (85.6 kg)  10/26/22 194 lb 9.6 oz (88.3 kg)    GEN: Well nourished, well developed in no acute distress NECK: No JVD; No carotid bruits CARDIAC:  RRR, no murmurs, rubs, gallops RESPIRATORY:  Clear to auscultation without rales, wheezing or rhonchi  ABDOMEN: Soft, non-tender, non-distended EXTREMITIES:  No edema; No deformity   ASSESSMENT AND PLAN: .    HFrecEF with nonischemic cardiomyopathy, LVEF 55 to 6% by echocardiogram 10/2022  He is clinically stable with NYHA class I symptoms and no evidence of fluid retention.  Continue Coreg, Aldactone, and as needed Lasix.  CHB while sleeping/NSVT on monitor-see above.Patient asymptomatic. Continue current reduced dose of coreg. Patient does snore and stop breathing at night so will order home sleep study.  Acute PE now on eliquis   Essential hypertension.  Blood pressure was low in hospital and lisinopril stopped and coreg decreased  Suspected Sleep apnea. Order sleep study   Mixed hyperlipidemia.  LDL 89 in February on Zocor.    CKD stage IIIb.  Creatinine  1.3 at discharge        Dispo: f/u as scheduled  Signed, Jacolyn Reedy, PA-C

## 2022-12-19 ENCOUNTER — Telehealth: Payer: Self-pay | Admitting: Physician Assistant

## 2022-12-19 DIAGNOSIS — R001 Bradycardia, unspecified: Secondary | ICD-10-CM | POA: Diagnosis not present

## 2022-12-19 NOTE — Telephone Encounter (Signed)
Noted  

## 2022-12-19 NOTE — Telephone Encounter (Signed)
Abnormal report on Zio monitor. Spoke with Irving Burton from [facility] who states that pt had 8 sec run of complete heart block and 3 runs of V-Tach. Monitor uploaded. Please advise.

## 2022-12-19 NOTE — Telephone Encounter (Signed)
Corey Murphy from [facility] is calling to report an abnormal zio monitor

## 2022-12-25 ENCOUNTER — Ambulatory Visit: Payer: Medicare HMO | Attending: Physician Assistant | Admitting: Physician Assistant

## 2022-12-25 ENCOUNTER — Encounter: Payer: Self-pay | Admitting: Physician Assistant

## 2022-12-25 VITALS — BP 116/68 | HR 72 | Ht 67.0 in | Wt 176.0 lb

## 2022-12-25 DIAGNOSIS — I2699 Other pulmonary embolism without acute cor pulmonale: Secondary | ICD-10-CM

## 2022-12-25 DIAGNOSIS — I10 Essential (primary) hypertension: Secondary | ICD-10-CM | POA: Diagnosis not present

## 2022-12-25 DIAGNOSIS — I4729 Other ventricular tachycardia: Secondary | ICD-10-CM

## 2022-12-25 DIAGNOSIS — R29818 Other symptoms and signs involving the nervous system: Secondary | ICD-10-CM

## 2022-12-25 DIAGNOSIS — I428 Other cardiomyopathies: Secondary | ICD-10-CM | POA: Diagnosis not present

## 2022-12-25 DIAGNOSIS — I442 Atrioventricular block, complete: Secondary | ICD-10-CM

## 2022-12-25 DIAGNOSIS — N183 Chronic kidney disease, stage 3 unspecified: Secondary | ICD-10-CM | POA: Diagnosis not present

## 2022-12-25 DIAGNOSIS — E785 Hyperlipidemia, unspecified: Secondary | ICD-10-CM

## 2022-12-25 NOTE — Patient Instructions (Signed)
Medication Instructions:   Your physician recommends that you continue on your current medications as directed. Please refer to the Current Medication list given to you today.   Labwork: None today  Testing/Procedures:  Your provider has requested an Itamar sleep study . Leonides Schanz, CMA will call you set up.  Follow-Up: Keep December appointment with Dr.McDowell  Any Other Special Instructions Will Be Listed Below (If Applicable).    If you need a refill on your cardiac medications before your next appointment, please call your pharmacy.

## 2022-12-27 NOTE — Progress Notes (Signed)
Sleep Apnea Evaluation  [facility]  Today's Date: 12/27/2022   Patient Name: Corey Murphy        DOB: Dec 08, 1950       Height:  5\' 7"  (1.702 m)     Weight: 176 lb (79.8 kg)  BMI: Body mass index is 27.57 kg/m.    Referring Provider:  Jacolyn Reedy, PA-C   STOP-BANG RISK ASSESSMENT         If STOP-BANG Score ?3 OR two clinical symptoms - patient qualifies for WatchPAT (CPT 95800)      Sleep study ordered due to two (2) of the following clinical symptoms/diagnoses:  Excessive daytime sleepiness G47.10  Gastroesophageal reflux K21.9  Nocturia R35.1  Morning Headaches G44.221  Difficulty concentrating R41.840  Memory problems or poor judgment G31.84  Personality changes or irritability R45.4  Loud snoring R06.83  Depression F32.9  Unrefreshed by sleep G47.8  Impotence N52.9  History of high blood pressure R03.0  Insomnia G47.00  Sleep Disordered Breathing or Sleep Apnea ICD G47.33

## 2022-12-27 NOTE — Addendum Note (Signed)
Addended by: Roseanne Reno on: 12/27/2022 06:59 AM   Modules accepted: Orders

## 2023-01-02 DIAGNOSIS — E663 Overweight: Secondary | ICD-10-CM | POA: Diagnosis not present

## 2023-01-02 DIAGNOSIS — H6980 Other specified disorders of Eustachian tube, unspecified ear: Secondary | ICD-10-CM | POA: Diagnosis not present

## 2023-01-02 DIAGNOSIS — C9001 Multiple myeloma in remission: Secondary | ICD-10-CM | POA: Diagnosis not present

## 2023-01-02 DIAGNOSIS — I429 Cardiomyopathy, unspecified: Secondary | ICD-10-CM | POA: Diagnosis not present

## 2023-01-02 DIAGNOSIS — I130 Hypertensive heart and chronic kidney disease with heart failure and stage 1 through stage 4 chronic kidney disease, or unspecified chronic kidney disease: Secondary | ICD-10-CM | POA: Diagnosis not present

## 2023-01-02 DIAGNOSIS — E1129 Type 2 diabetes mellitus with other diabetic kidney complication: Secondary | ICD-10-CM | POA: Diagnosis not present

## 2023-01-02 DIAGNOSIS — E1140 Type 2 diabetes mellitus with diabetic neuropathy, unspecified: Secondary | ICD-10-CM | POA: Diagnosis not present

## 2023-01-02 DIAGNOSIS — N1831 Chronic kidney disease, stage 3a: Secondary | ICD-10-CM | POA: Diagnosis not present

## 2023-01-02 DIAGNOSIS — Z6827 Body mass index (BMI) 27.0-27.9, adult: Secondary | ICD-10-CM | POA: Diagnosis not present

## 2023-01-02 DIAGNOSIS — J0100 Acute maxillary sinusitis, unspecified: Secondary | ICD-10-CM | POA: Diagnosis not present

## 2023-01-02 DIAGNOSIS — I5032 Chronic diastolic (congestive) heart failure: Secondary | ICD-10-CM | POA: Diagnosis not present

## 2023-01-05 ENCOUNTER — Ambulatory Visit: Payer: Medicare HMO

## 2023-01-05 ENCOUNTER — Other Ambulatory Visit: Payer: Self-pay

## 2023-01-05 NOTE — Progress Notes (Signed)
Sleep Apnea Evaluation  [facility]  Today's Date: 01/05/2023   Patient Name: Corey Murphy        DOB: 05-02-1950       Height:        Weight:    BMI: There is no height or weight on file to calculate BMI.    Referring Provider:  Jacolyn Reedy, PA-C   STOP-BANG RISK ASSESSMENT       12/27/2022    6:58 AM  STOP-BANG  Do you snore loudly? Yes  Do you often feel tired, fatigued, or sleepy during the daytime? Yes  Has anyone observed you stop breathing during sleep? No  Do you have (or are you being treated for) high blood pressure? Yes  Recent BMI (Calculated) 27.56  Is BMI greater than 35 kg/m2? 0=No  Age older than 70 years old? 1=Yes  Has large neck size > 40 cm (15.7 in, large male shirt size, large male collar size > 16) No  Gender - Male 1=Yes  STOP-Bang Total Score 5      If STOP-BANG Score ?3 OR two clinical symptoms - patient qualifies for WatchPAT (CPT 95800)      Sleep study ordered due to two (2) of the following clinical symptoms/diagnoses:  Excessive daytime sleepiness G47.10  Gastroesophageal reflux K21.9  Nocturia R35.1  Morning Headaches G44.221  Difficulty concentrating R41.840  Memory problems or poor judgment G31.84  Personality changes or irritability R45.4  Loud snoring R06.83  Depression F32.9  Unrefreshed by sleep G47.8  Impotence N52.9  History of high blood pressure R03.0  Insomnia G47.00  Sleep Disordered Breathing or Sleep Apnea ICD G47.33

## 2023-01-28 ENCOUNTER — Other Ambulatory Visit: Payer: Self-pay | Admitting: Physician Assistant

## 2023-01-30 ENCOUNTER — Telehealth: Payer: Self-pay | Admitting: Cardiology

## 2023-01-30 MED ORDER — CARVEDILOL 6.25 MG PO TABS: 6.2500 mg | ORAL_TABLET | Freq: Two times a day (BID) | ORAL | 3 refills | Status: DC

## 2023-01-30 NOTE — Telephone Encounter (Signed)
Pt came in office and stated that his Carvedilol 6.25 mg is out and [facility] [address] has stated they have faxed several times with no response. He needs refills. [phone]

## 2023-01-30 NOTE — Telephone Encounter (Signed)
[facility]

## 2023-03-01 DIAGNOSIS — I2699 Other pulmonary embolism without acute cor pulmonale: Secondary | ICD-10-CM | POA: Diagnosis not present

## 2023-03-01 DIAGNOSIS — I130 Hypertensive heart and chronic kidney disease with heart failure and stage 1 through stage 4 chronic kidney disease, or unspecified chronic kidney disease: Secondary | ICD-10-CM | POA: Diagnosis not present

## 2023-03-01 DIAGNOSIS — E1140 Type 2 diabetes mellitus with diabetic neuropathy, unspecified: Secondary | ICD-10-CM | POA: Diagnosis not present

## 2023-03-07 ENCOUNTER — Encounter (INDEPENDENT_AMBULATORY_CARE_PROVIDER_SITE_OTHER): Payer: Medicare HMO | Admitting: Ophthalmology

## 2023-03-07 DIAGNOSIS — I10 Essential (primary) hypertension: Secondary | ICD-10-CM

## 2023-03-07 DIAGNOSIS — H43813 Vitreous degeneration, bilateral: Secondary | ICD-10-CM

## 2023-03-07 DIAGNOSIS — H35033 Hypertensive retinopathy, bilateral: Secondary | ICD-10-CM

## 2023-03-07 DIAGNOSIS — E113291 Type 2 diabetes mellitus with mild nonproliferative diabetic retinopathy without macular edema, right eye: Secondary | ICD-10-CM

## 2023-03-07 DIAGNOSIS — Z7984 Long term (current) use of oral hypoglycemic drugs: Secondary | ICD-10-CM

## 2023-03-07 DIAGNOSIS — H35373 Puckering of macula, bilateral: Secondary | ICD-10-CM

## 2023-03-16 ENCOUNTER — Telehealth: Payer: Self-pay

## 2023-03-16 NOTE — Telephone Encounter (Signed)
**Note De-Identified Deyci Gesell Obfuscation** Ordering provider: Jacolyn Reedy, PA-c Associated diagnoses: Snoring-R06.83 and CAD-I25.118 WatchPAT PA obtained on 03/16/2023 by Perle Brickhouse, Lorelle Formosa, LPN. Per the [employer] MEDICARE website: CPT code: 44010 If you're a participating provider, no precertification is required when this service is performed as an outpatient procedure for a medical or surgical diagnosis.  Patient notified of PIN ([phone]) on 03/16/2023 Elihu Milstein Notification Method: MyChart message. The pts home number is no longer in service and I got no answer on his cell phone so I did leave a message on his VM advising him that I am sending him a [facility] message concerning my call. I id leave the office phone number in my message so he can call us back if he has any questions or concerns.  Phone note routed to covering staff for follow-up.

## 2023-03-20 NOTE — Telephone Encounter (Signed)
MyChart message has not been read by patient- left a message on pt's cell vm to call office back regarding his Itamar device.

## 2023-03-22 ENCOUNTER — Emergency Department (HOSPITAL_COMMUNITY): Payer: Medicare HMO

## 2023-03-22 ENCOUNTER — Encounter (HOSPITAL_COMMUNITY): Payer: Self-pay

## 2023-03-22 ENCOUNTER — Other Ambulatory Visit: Payer: Self-pay

## 2023-03-22 ENCOUNTER — Emergency Department (HOSPITAL_COMMUNITY)
Admission: EM | Admit: 2023-03-22 | Discharge: 2023-03-22 | Disposition: A | Discharge status: Home / Self Care | Payer: Medicare HMO | Attending: Emergency Medicine | Admitting: Emergency Medicine

## 2023-03-22 DIAGNOSIS — M79662 Pain in left lower leg: Secondary | ICD-10-CM | POA: Diagnosis not present

## 2023-03-22 DIAGNOSIS — Z96643 Presence of artificial hip joint, bilateral: Secondary | ICD-10-CM | POA: Insufficient documentation

## 2023-03-22 DIAGNOSIS — N189 Chronic kidney disease, unspecified: Secondary | ICD-10-CM | POA: Insufficient documentation

## 2023-03-22 DIAGNOSIS — M79604 Pain in right leg: Secondary | ICD-10-CM | POA: Diagnosis not present

## 2023-03-22 DIAGNOSIS — I129 Hypertensive chronic kidney disease with stage 1 through stage 4 chronic kidney disease, or unspecified chronic kidney disease: Secondary | ICD-10-CM | POA: Insufficient documentation

## 2023-03-22 DIAGNOSIS — Z87891 Personal history of nicotine dependence: Secondary | ICD-10-CM | POA: Insufficient documentation

## 2023-03-22 DIAGNOSIS — R0789 Other chest pain: Secondary | ICD-10-CM | POA: Diagnosis not present

## 2023-03-22 DIAGNOSIS — E1122 Type 2 diabetes mellitus with diabetic chronic kidney disease: Secondary | ICD-10-CM | POA: Diagnosis not present

## 2023-03-22 DIAGNOSIS — I10 Essential (primary) hypertension: Secondary | ICD-10-CM

## 2023-03-22 DIAGNOSIS — R079 Chest pain, unspecified: Secondary | ICD-10-CM | POA: Diagnosis not present

## 2023-03-22 HISTORY — DX: Other pulmonary embolism without acute cor pulmonale: I26.99

## 2023-03-22 LAB — BASIC METABOLIC PANEL
Anion gap: 7 (ref 5–15)
BUN: 19 mg/dL (ref 8–23)
CO2: 24 mmol/L (ref 22–32)
Calcium: 8.4 mg/dL — ABNORMAL LOW (ref 8.9–10.3)
Chloride: 106 mmol/L (ref 98–111)
Creatinine, Ser: 1.43 mg/dL — ABNORMAL HIGH (ref 0.61–1.24)
GFR, Estimated: 53 mL/min — ABNORMAL LOW (ref >60)
Glucose, Bld: 159 mg/dL — ABNORMAL HIGH (ref 70–99)
Potassium: 3.2 mmol/L — ABNORMAL LOW (ref 3.5–5.1)
Sodium: 137 mmol/L (ref 135–145)

## 2023-03-22 LAB — CBC
HCT: 38.6 % — ABNORMAL LOW (ref 39.0–52.0)
Hemoglobin: 13.2 g/dL (ref 13.0–17.0)
MCH: 31.2 pg (ref 26.0–34.0)
MCHC: 34.2 g/dL (ref 30.0–36.0)
MCV: 91.3 fL (ref 80.0–100.0)
Platelets: 246 10*3/uL (ref 150–400)
RBC: 4.23 MIL/uL (ref 4.22–5.81)
RDW: 14.2 % (ref 11.5–15.5)
WBC: 5.6 10*3/uL (ref 4.0–10.5)
nRBC: 0 % (ref 0.0–0.2)

## 2023-03-22 LAB — TROPONIN I (HIGH SENSITIVITY): Troponin I (High Sensitivity): 8 ng/L (ref <18)

## 2023-03-22 LAB — CK: Total CK: 94 U/L (ref 49–397)

## 2023-03-22 NOTE — Discharge Instructions (Signed)
You were evaluated in the Emergency Department and after careful evaluation, we did not find any emergent condition requiring admission or further testing in the hospital.  Your exam/testing today is overall reassuring.  Recommend close follow-up with your primary care doctor to discuss her symptoms.  Please return to the Emergency Department if you experience any worsening of your condition.   Thank you for allowing [place] to be a part of your care.

## 2023-03-22 NOTE — ED Provider Notes (Signed)
[facility]-EMER[facility]ENCY DEPT [facility] Emergency Department Provider Note MRN:  [id number]  Arrival date & time: 03/22/23     Chief Complaint   Leg pain History of Present Illness   Corey Murphy is a 72 y.o. year-old male with a history of hypertension, cardiomyopathy, diabetes, pulmonary embolism presenting to the ED with chief complaint of leg pain.  Patient was exercising for 70 minutes and then afterwards had pain in his right thigh.  Was fairly painful and would not go away and this was causing him concern.  Took a home hydrocodone pill and this started to help a little bit.  Then began feeling burning chest pain described as stomach acid which also concerned him.  Then noticed that his heart rate and blood pressure were elevated.  Concerned because his vital signs were similarly elevated during his blood clot recently.  Here for evaluation.  Symptoms are all resolved at this time.  Review of Systems  A thorough review of systems was obtained and all systems are negative except as noted in the HPI and PMH.   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  Diminutive adenomatous polyp in the mid-right colon, otherwise normal   COLONOSCOPY N/A 06/19/2012   Procedure: COLONOSCOPY;  Surgeon: Corbin Ade, MD;  Location: [facility]O [facility];  Service: Endoscopy;  Laterality: N/A;   8:30AM-rescheduled 10:30am [name] notified pt   COLONOSCOPY N/A 08/08/2017   Surgeon: Corbin Ade, MD;  diverticulosis in sigmoid, descending, and transverse colon, three 4-7 mm polyps in ascending colon, and one 9 mm polyp in ascending colon. Pathology with tubular adenomas. Repeat in 3 years.   COLONOSCOPY N/A 04/23/2019   Sigmoid and descending colon diverticulosis, one 5 mm polyp (tubular adenoma) in descending colon, non-bleeding internal hemorrhoids. 5 year surveillance.    Ileocolonoscopy  06/28/2007   UYQ:IHKVQQ rectum/Submucosal sigmoid diverticula (chronic), doubt clinical significance Hyperplastic polypoid-appearing fold, mid ascending colon, status/Remainder colonic mucosa and terminal ileum mucosa appeared normal    LEFT AND RIGHT HEART CATHETERIZATION WITH CORONARY ANGIOGRAM  03/19/2013   Procedure: LEFT AND RIGHT HEART CATHETERIZATION WITH CORONARY ANGIOGRAM;  Surgeon: Iran Ouch, MD;  Location: [facility] CATH LAB;  Service: Cardiovascular;;   POLYPECTOMY  08/08/2017   Procedure: POLYPECTOMY;  Surgeon: Corbin Ade, MD;  Location: [facility];  Service: Endoscopy;;  ascending colon polyps x4 (cold snare-3,hot snare)   TOTAL HIP ARTHROPLASTY  2010   Left   TOTAL HIP ARTHROPLASTY  2010   Right    Family History  Problem Relation Age of Onset   Heart failure Mother    Diabetes Mother    Hypertension Mother    Heart failure Sister    Hypertension Sister    Cancer Sister    Colon cancer Neg Hx  Social History   Socioeconomic History   Marital status: Married    Spouse name: Not on file   Number of children: 2   Years of education: Not on file   Highest education level: Not on file  Occupational History   Occupation: Retired, Set designer  Tobacco Use   Smoking status: Former    Current packs/day: 0.00    Average packs/day: 1 pack/day for 12.0 years (12.0 ttl pk-yrs)    Types: Cigarettes    Start date: 05/28/1996    Quit date: 05/28/2008    Years since  quitting: 14.8   Smokeless tobacco: Never  Vaping Use   Vaping status: Never Used  Substance and Sexual Activity   Alcohol use: Yes    Comment: Occasionally 2 to 3 times a month   Drug use: Not Currently    Types: Marijuana    Comment: Smoked via a bowl occassionally.    Sexual activity: Not on file  Other Topics Concern   Not on file  Social History Narrative   Not on file   Social Determinants of Health   Financial Resource Strain: Not on file  Food Insecurity: No Food Insecurity (11/26/2022)   Hunger Vital Sign    Worried About Running Out of Food in the Last Year: Never true    Ran Out of Food in the Last Year: Never true  Transportation Needs: No Transportation Needs (11/26/2022)   PRAPARE - Administrator, Civil Service (Medical): No    Lack of Transportation (Non-Medical): No  Physical Activity: Inactive (04/14/2020)   Exercise Vital Sign    Days of Exercise per Week: 0 days    Minutes of Exercise per Session: 0 min  Stress: No Stress Concern Present (04/14/2020)   [employer] of Occupational Health - Occupational Stress Questionnaire    Feeling of Stress : Not at all  Social Connections: Moderately Integrated (04/14/2020)   Social Connection and Isolation Panel [NHANES]    Frequency of Communication with Friends and Family: More than three times a week    Frequency of Social Gatherings with Friends and Family: Three times a week    Attends Religious Services: 1 to 4 times per year    Active Member of Clubs or Organizations: No    Attends Banker Meetings: Never    Marital Status: Married  Catering manager Violence: Not At Risk (11/26/2022)   Humiliation, Afraid, Rape, and Kick questionnaire    Fear of Current or Ex-Partner: No    Emotionally Abused: No    Physically Abused: No    Sexually Abused: No     Physical Exam   Vitals:   03/22/23 0225  BP: (!) 163/102  Pulse: 85  Resp: 17  Temp: 97.9 F (36.6 C)  SpO2: 97%     CONSTITUTIONAL: Well-appearing, NAD NEURO/PSYCH:  Alert and oriented x 3, no focal deficits EYES:  eyes equal and reactive ENT/NECK:  no LAD, no JVD CARDIO: Regular rate, well-perfused, normal S1 and S2 PULM:  CTAB no wheezing or rhonchi GI/GU:  non-distended, non-tender MSK/SPINE:  No gross deformities, no edema SKIN:  no rash, atraumatic   *Additional and/or pertinent findings included in MDM below  Diagnostic and Interventional Summary    EKG Interpretation Date/Time:  Thursday March 22 2023 02:53:53 EST Ventricular Rate:  74 PR Interval:  402 QRS Duration:  123 QT Interval:  399 QTC Calculation: 443 R Axis:   0  Text Interpretation: Sinus rhythm Prolonged PR interval Nonspecific  intraventricular conduction delay Inferior infarct, old Confirmed by Kennis Carina [phone]) on 03/22/2023 3:11:47 AM       Labs Reviewed  CBC - Abnormal; Notable for the following components:      Result Value   HCT 38.6 (*)    All other components within normal limits  BASIC METABOLIC PANEL - Abnormal; Notable for the following components:   Potassium 3.2 (*)    Glucose, Bld 159 (*)    Creatinine, Ser 1.43 (*)    Calcium 8.4 (*)    GFR, Estimated 53 (*)    All other components within normal limits  CK  TROPONIN I (HIGH SENSITIVITY)    DG Chest Port 1 View  Final Result      Medications - No data to display   Procedures  /  Critical Care Procedures  ED Course and Medical Decision Making  Initial Impression and Ddx Patient is well-appearing in no acute distress with mild hypertension but otherwise reassuring vital signs, no increased work of breathing, no tachypnea, no hypoxia.  Legs appear normal, no swelling, no tenderness.  Lungs clear.  Denies any missed doses with regard to his Eliquis.  Overall very low concern for recurrent DVT/PE.  Favoring musculoskeletal leg pain given the location, description, and the pain being preceded by fairly long duration of exercise.   Description of chest pain seems most consistent with GI etiology, improved and went away after a home dose of Nexium.  Still ACS is considered given age, risk factors.  Awaiting labs, will monitor closely for any return of symptoms.  Past medical/surgical history that increases complexity of ED encounter: PE  Interpretation of Diagnostics I personally reviewed the EKG and my interpretation is as follows: Sinus rhythm without concerning ischemic findings, first-degree AV block similar to prior  Labs reassuring with no significant blood count or electrolyte disturbance, troponin negative.  Patient Reassessment and Ultimate Disposition/Management     Patient's chest pain was brief and happened at 10 PM and so no real benefit to second troponin.  Continues to have no symptoms here in the emergency department.  Overall highly doubt emergent cardiopulmonary process given the description and evaluation, suspect a component of anxiety given his recent experience with pulmonary embolism.  Patient is appropriate for discharge.  Patient management required discussion with the following services or consulting groups:  None  Complexity of Problems Addressed Acute illness or injury that poses threat of life of bodily function  Additional Data Reviewed and Analyzed Further history obtained from: Further history from spouse/family member  Additional Factors Impacting ED Encounter Risk Consideration of hospitalization  Elmer Sow. Pilar Plate, MD [facility] Emergency Medicine [facility] [email or web address]  Final Clinical Impressions(s) / ED Diagnoses     ICD-10-CM   1. Hypertension, unspecified type  I10     2. Pain of right lower extremity  M79.604       ED Discharge Orders     None        Discharge Instructions Discussed with and Provided to Patient:     Discharge Instructions      You were evaluated in the Emergency Department and after careful evaluation, we did not  find any emergent condition requiring admission or further testing in the hospital.  Your exam/testing today is overall reassuring.  Recommend close follow-up with your primary care doctor to discuss her symptoms.  Please return to the Emergency Department if you experience any worsening of your condition.   Thank you for allowing  [place] to be a part of your care.       Sabas Sous, MD 03/22/23 [phone]

## 2023-03-22 NOTE — Telephone Encounter (Signed)
Left a message for patient to call office back regarding Itamar sleep device.

## 2023-03-22 NOTE — ED Triage Notes (Signed)
Pt to ED with c/o right thigh pain (sudden onset) this evening about thirty minutes to hour after exercising on stationary bicycle for about an hour, pt decided to check BP and HR because he was scared of a blood clot. Pt says he had hx of PE and thought he should be checked out. Pt says thigh pain has almost gone after taking vicodin. Pt takes Eliquis as prescribed. Denies chest pain.

## 2023-03-26 NOTE — Telephone Encounter (Signed)
Spoke to pt and verbalized pin #: [id number]. Patient had no questions or concerns at this time.

## 2023-03-27 ENCOUNTER — Encounter (INDEPENDENT_AMBULATORY_CARE_PROVIDER_SITE_OTHER): Payer: Medicare HMO | Admitting: Cardiology

## 2023-03-27 DIAGNOSIS — R0683 Snoring: Secondary | ICD-10-CM | POA: Diagnosis not present

## 2023-03-27 DIAGNOSIS — I442 Atrioventricular block, complete: Secondary | ICD-10-CM | POA: Diagnosis not present

## 2023-03-28 ENCOUNTER — Ambulatory Visit: Payer: Medicare HMO | Attending: Physician Assistant

## 2023-03-28 DIAGNOSIS — R29818 Other symptoms and signs involving the nervous system: Secondary | ICD-10-CM

## 2023-03-28 NOTE — Telephone Encounter (Signed)
Itamar sleep study completed.

## 2023-03-28 NOTE — Procedures (Signed)
   SLEEP STUDY REPORT Patient Information Study Date: 03/28/2023 Patient Name: Corey Murphy Patient ID: [id number] Birth Date: [birth year] Age: 72 Gender: Male BMI: 27.7 (W=176 lb, H=5' 7'') Stopbang: 5 Referring Physician: Jacolyn Reedy, PA  TEST DESCRIPTION: Home sleep apnea testing was completed using the WatchPat, a Type 1 device, utilizing peripheral arterial tonometry (PAT), chest movement, actigraphy, pulse oximetry, pulse rate, body position and snore. AHI was calculated with apnea and hypopnea using valid sleep time as the denominator. RDI includes apneas, hypopneas, and RERAs. The data acquired and the scoring of sleep and all associated events were performed in accordance with the recommended standards and specifications as outlined in the AASM Manual for the Scoring of Sleep and Associated Events 2.2.0 (2015).  FINDINGS: 1. No evidence of Obstructive Sleep Apnea with AHI 3.1/hr. 2. No Central Sleep Apnea. 3. Oxygen desaturations as low as 84%. 4. Mild to moderate snoring was present. O2 sats were < 88% for 1.6 minutes. 5. Total sleep time was 4 hrs and 51 min. 6. 30.7% of total sleep time was spent in REM sleep. 7. Prolonged sleep onset latency at 32 min. 8. Shortened REM sleep onset latency at 72 min. 9. Total awakenings were 5.  DIAGNOSIS: Normal study with no significant sleep disordered breathing.  RECOMMENDATIONS: 1. Normal study with no significant sleep disordered breathing.  2. Healthy sleep recommendations include: adequate nightly sleep (normal 7-9 hrs/night), avoidance of caffeine after noon and alcohol near bedtime, and maintaining a sleep environment that is cool, dark and quiet.  3. Weight loss for overweight patients is recommended.  4. Snoring recommendations include: weight loss where appropriate, side sleeping, and avoidance of alcohol before bed.  5. Operation of motor vehicle or dangerous equipment must be avoided when feeling drowsy,  excessively sleepy, or mentally fatigued.  6. An ENT consultation which may be useful for specific causes of and possible treatment of bothersome snoring .  7. Weight loss may be of benefit in reducing the severity of snoring.   Signature: Armanda Magic, MD; [facility]; Diplomat, American Board of Sleep Medicine Electronically Signed: 03/28/2023 11:12:32 PM

## 2023-04-02 ENCOUNTER — Telehealth: Payer: Self-pay

## 2023-04-02 NOTE — Telephone Encounter (Signed)
-----   Message from Armanda Magic sent at 03/28/2023 11:14 PM EST ----- Please let patient know that sleep study showed no significant sleep apnea.

## 2023-04-02 NOTE — Telephone Encounter (Signed)
Called patient for sleep study results and recommendation. Patient VM full, unable to leave VM.

## 2023-04-10 ENCOUNTER — Encounter: Payer: Self-pay | Admitting: Student

## 2023-04-10 ENCOUNTER — Ambulatory Visit: Payer: Medicare HMO | Attending: Student | Admitting: Student

## 2023-04-10 VITALS — BP 122/80 | HR 69 | Ht 68.0 in | Wt 170.8 lb

## 2023-04-10 DIAGNOSIS — I10 Essential (primary) hypertension: Secondary | ICD-10-CM

## 2023-04-10 DIAGNOSIS — I440 Atrioventricular block, first degree: Secondary | ICD-10-CM | POA: Diagnosis not present

## 2023-04-10 DIAGNOSIS — Z8679 Personal history of other diseases of the circulatory system: Secondary | ICD-10-CM | POA: Diagnosis not present

## 2023-04-10 DIAGNOSIS — E782 Mixed hyperlipidemia: Secondary | ICD-10-CM

## 2023-04-10 DIAGNOSIS — Z86711 Personal history of pulmonary embolism: Secondary | ICD-10-CM

## 2023-04-10 MED ORDER — CARVEDILOL 3.125 MG PO TABS: 3.1250 mg | ORAL_TABLET | Freq: Two times a day (BID) | ORAL | 3 refills | Status: DC

## 2023-04-10 NOTE — Patient Instructions (Signed)
Medication Instructions:  Your physician has recommended you make the following change in your medication:   -Decrease Coreg to 3.125 mg twice daily  *If you need a refill on your cardiac medications before your next appointment, please call your pharmacy*   Lab Work: None If you have labs (blood work) drawn today and your tests are completely normal, you will receive your results only by: MyChart Message (if you have MyChart) OR A paper copy in the mail If you have any lab test that is abnormal or we need to change your treatment, we will call you to review the results.   Testing/Procedures: None   Follow-Up: At [facility], you and your health needs are our priority.  As part of our continuing mission to provide you with exceptional heart care, we have created designated Provider Care Teams.  These Care Teams include your primary Cardiologist (physician) and Advanced Practice Providers (APPs -  Physician Assistants and Nurse Practitioners) who all work together to provide you with the care you need, when you need it.  We recommend signing up for the patient portal called "MyChart".  Sign up information is provided on this After Visit Summary.  MyChart is used to connect with patients for Virtual Visits (Telemedicine).  Patients are able to view lab/test results, encounter notes, upcoming appointments, etc.  Non-urgent messages can be sent to your provider as well.   To learn more about what you can do with MyChart, go to [email or web address].    Your next appointment:   6 month(s)  Provider:   You may see Nona Dell, MD or one of the following Advanced Practice Providers on your designated Care Team:   Randall An, PA-C  Jacolyn Reedy, [place]     Other Instructions   s

## 2023-04-10 NOTE — Progress Notes (Signed)
Cardiology Office Note    Date:  04/10/2023  ID:  Corey Murphy, DOB 1950-11-11, MRN [id number] Cardiologist: Nona Dell, MD    History of Present Illness:    Corey Murphy is a 72 y.o. 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  He was examined by Jacolyn Reedy, PA in 11/2022 and had recently been hospitalized for an acute PE. He did have bradycardia during admission which led to dose reduction of Coreg and a monitor had been placed as an outpatient which showed an 8-second run of complete heart block which occurred during the nocturnal hours. At the time of follow-up, he reported overall feeling well and denied any recent chest pain, dizziness or presyncope. Given his nocturnal bradycardia, a sleep study was recommended for further evaluation.  This was read in 03/2023 and showed no significant sleep disordered breathing.  He did present to the ED in 03/2023 for evaluation of right thigh pain. Reported he had been exercising and his heart rate had been elevated. Hs troponin was negative at 8 and EKG showed normal sinus rhythm with heart rate at 74 but he did have a significant first-degree AV block. Was discharged home.  In talking with the patient and his wife today, he reports overall doing well since his last office visit. He exercises at the [facility] most days of the week and also lifts weights at night at home. He denies any recent chest pain or dyspnea on exertion with these activities. No specific orthopnea, PND or pitting edema. He denies any recent chest pain or palpitations. No recent dizziness or presyncope.   Studies Reviewed:   EKG: EKG is not ordered today.  Echocardiogram: 10/2022 IMPRESSIONS     1. Left ventricular ejection  fraction, by estimation, is 55 to 60%. The  left ventricle has normal function. The left ventricle has no regional  wall motion abnormalities. There is mild asymmetric left ventricular  hypertrophy of the infero-lateral  segment. Indeterminate diastolic filling due to E-A fusion.   2. Right ventricular systolic function is mildly reduced. The right  ventricular size is mildly enlarged. Tricuspid regurgitation signal is  inadequate for assessing PA pressure.   3. Right atrial size was severely dilated.   4. The mitral valve is normal in structure. Trivial mitral valve  regurgitation.   5. The aortic valve is tricuspid. Aortic valve regurgitation is not  visualized.   6. The inferior vena cava is normal in size with greater than 50%  respiratory variability, suggesting right atrial pressure of 3 mmHg.   Comparison(s): Prior images reviewed side by side.    Event Monitor: 11/2022 ZIO monitor reviewed.  13 days, 21 hours analyzed.   Predominant rhythm is sinus with heart rate ranging from 49 bpm up to 145 bpm with average heart rate 76 bpm. There were rare PACs representing less than 1% total beats. There were rare PVCs including ventricular couplets and triplets representing less than 1% total beats.  Also limited episodes of ventricular bigeminy and trigeminy as well as 3 episodes of NSVT, the longest of which was 11 beats.  There were no sustained ventricular events. There was an 8-second episode of complete heart block noted at 4:28 AM on August 12 with narrow complex escape at 36 bpm.  Prolonged PR interval noted at baseline and there were limited brief episodes of second-degree type I Wenckebach conduction as well. No pauses.   Physical Exam:   VS:  BP 122/80   Pulse 69   Ht 5\' 8"  (1.727 m)   Wt 170 lb 12.8 oz (77.5 kg)   SpO2 98%   BMI 25.97 kg/m    Wt Readings from Last 3 Encounters:  04/10/23 170 lb 12.8 oz (77.5 kg)  03/22/23 163 lb (73.9 kg)  12/25/22 176 lb (79.8 kg)      GEN: Well nourished, well developed male appearing in no acute distress NECK: No JVD; No carotid bruits CARDIAC: RRR, no murmurs, rubs, gallops RESPIRATORY:  Clear to auscultation without rales, wheezing or rhonchi  ABDOMEN: Appears non-distended. No obvious abdominal masses. EXTREMITIES: No clubbing or cyanosis. No pitting edema.  Distal pedal pulses are 2+ bilaterally.   Assessment and Plan:   1. Chronic HFimpEF - His ejection fraction was previously at 15% in 2014 and has normalized by repeat imaging. Most recent echocardiogram in 10/2022 showed a normal EF of 55 to 60%. - He appears euvolemic by examination today and denies any recent respiratory issues. Remains on Spironolactone 25 mg daily and does take Lasix 20 mg as needed for weight gain. He has been on Coreg 6.25 mg twice daily and will reduce this to 3.125 mg twice daily given his underlying conduction disease.  2.  1st Degree AV Block/Nocturnal Bradycardia - Prior monitor in 11/2022 showed an 8-second episode of complete heart block but this was during the nocturnal hours. His recent EKG last month showed a significantly prolonged PR interval which has increased over the past several years. Coreg was reduced from 12.5 mg twice daily to 6.25 mg twice daily during his admission in 10/2022. Will further reduce to 3.125 mg twice daily and have him return for a follow-up EKG in 1 month. If PR interval is still significantly prolonged (and given his monitor results), would ultimately stop Coreg and may need to add a different agent for blood pressure control.  3. HTN - His blood pressure is well-controlled at 122/80 during today's visit. Continue Spironolactone 25 mg daily but will reduce Coreg from 6.25 mg twice daily to 3.125 mg twice daily as discussed above.  4. HLD - Followed by his PCP. LDL was 89 in 06/2022. He has been continued on Simvastatin 20 mg daily.  5. History of PE - He remains on Eliquis 5 mg twice daily for  anticoagulation.  Signed, Ellsworth Lennox, PA-C

## 2023-04-11 ENCOUNTER — Ambulatory Visit: Payer: Medicare HMO | Admitting: Cardiology

## 2023-04-19 DIAGNOSIS — M1990 Unspecified osteoarthritis, unspecified site: Secondary | ICD-10-CM | POA: Diagnosis not present

## 2023-04-19 DIAGNOSIS — Z008 Encounter for other general examination: Secondary | ICD-10-CM | POA: Diagnosis not present

## 2023-04-19 DIAGNOSIS — N1831 Chronic kidney disease, stage 3a: Secondary | ICD-10-CM | POA: Diagnosis not present

## 2023-04-19 DIAGNOSIS — I4430 Unspecified atrioventricular block: Secondary | ICD-10-CM | POA: Diagnosis not present

## 2023-04-19 DIAGNOSIS — I2510 Atherosclerotic heart disease of native coronary artery without angina pectoris: Secondary | ICD-10-CM | POA: Diagnosis not present

## 2023-04-19 DIAGNOSIS — N529 Male erectile dysfunction, unspecified: Secondary | ICD-10-CM | POA: Diagnosis not present

## 2023-04-19 DIAGNOSIS — Z87891 Personal history of nicotine dependence: Secondary | ICD-10-CM | POA: Diagnosis not present

## 2023-04-19 DIAGNOSIS — G4700 Insomnia, unspecified: Secondary | ICD-10-CM | POA: Diagnosis not present

## 2023-04-19 DIAGNOSIS — E1122 Type 2 diabetes mellitus with diabetic chronic kidney disease: Secondary | ICD-10-CM | POA: Diagnosis not present

## 2023-04-19 DIAGNOSIS — Z7984 Long term (current) use of oral hypoglycemic drugs: Secondary | ICD-10-CM | POA: Diagnosis not present

## 2023-04-19 DIAGNOSIS — E785 Hyperlipidemia, unspecified: Secondary | ICD-10-CM | POA: Diagnosis not present

## 2023-04-19 DIAGNOSIS — I130 Hypertensive heart and chronic kidney disease with heart failure and stage 1 through stage 4 chronic kidney disease, or unspecified chronic kidney disease: Secondary | ICD-10-CM | POA: Diagnosis not present

## 2023-04-19 DIAGNOSIS — I429 Cardiomyopathy, unspecified: Secondary | ICD-10-CM | POA: Diagnosis not present

## 2023-04-27 ENCOUNTER — Other Ambulatory Visit: Payer: Medicare HMO

## 2023-05-03 ENCOUNTER — Ambulatory Visit: Payer: Medicare HMO | Admitting: Hematology

## 2023-05-07 ENCOUNTER — Inpatient Hospital Stay: Payer: Medicare HMO | Attending: Hematology

## 2023-05-07 DIAGNOSIS — N189 Chronic kidney disease, unspecified: Secondary | ICD-10-CM | POA: Insufficient documentation

## 2023-05-07 DIAGNOSIS — Z87891 Personal history of nicotine dependence: Secondary | ICD-10-CM | POA: Insufficient documentation

## 2023-05-07 DIAGNOSIS — D472 Monoclonal gammopathy: Secondary | ICD-10-CM | POA: Diagnosis not present

## 2023-05-07 DIAGNOSIS — Z86711 Personal history of pulmonary embolism: Secondary | ICD-10-CM | POA: Diagnosis not present

## 2023-05-07 LAB — CBC WITH DIFFERENTIAL/PLATELET
Abs Immature Granulocytes: 0.01 10*3/uL (ref 0.00–0.07)
Basophils Absolute: 0.1 10*3/uL (ref 0.0–0.1)
Basophils Relative: 1 %
Eosinophils Absolute: 0.1 10*3/uL (ref 0.0–0.5)
Eosinophils Relative: 2 %
HCT: 44 % (ref 39.0–52.0)
Hemoglobin: 14.1 g/dL (ref 13.0–17.0)
Immature Granulocytes: 0 %
Lymphocytes Relative: 45 %
Lymphs Abs: 2.3 10*3/uL (ref 0.7–4.0)
MCH: 30.6 pg (ref 26.0–34.0)
MCHC: 32 g/dL (ref 30.0–36.0)
MCV: 95.4 fL (ref 80.0–100.0)
Monocytes Absolute: 0.7 10*3/uL (ref 0.1–1.0)
Monocytes Relative: 13 %
Neutro Abs: 2 10*3/uL (ref 1.7–7.7)
Neutrophils Relative %: 39 %
Platelets: 230 10*3/uL (ref 150–400)
RBC: 4.61 MIL/uL (ref 4.22–5.81)
RDW: 14.5 % (ref 11.5–15.5)
WBC: 5.1 10*3/uL (ref 4.0–10.5)
nRBC: 0 % (ref 0.0–0.2)

## 2023-05-07 LAB — COMPREHENSIVE METABOLIC PANEL
ALT: 28 U/L (ref 0–44)
AST: 20 U/L (ref 15–41)
Albumin: 3.7 g/dL (ref 3.5–5.0)
Alkaline Phosphatase: 72 U/L (ref 38–126)
Anion gap: 6 (ref 5–15)
BUN: 22 mg/dL (ref 8–23)
CO2: 25 mmol/L (ref 22–32)
Calcium: 8.8 mg/dL — ABNORMAL LOW (ref 8.9–10.3)
Chloride: 104 mmol/L (ref 98–111)
Creatinine, Ser: 1.47 mg/dL — ABNORMAL HIGH (ref 0.61–1.24)
GFR, Estimated: 51 mL/min — ABNORMAL LOW (ref >60)
Glucose, Bld: 99 mg/dL (ref 70–99)
Potassium: 3.4 mmol/L — ABNORMAL LOW (ref 3.5–5.1)
Sodium: 135 mmol/L (ref 135–145)
Total Bilirubin: 0.4 mg/dL (ref 0.0–1.2)
Total Protein: 8.1 g/dL (ref 6.5–8.1)

## 2023-05-08 LAB — KAPPA/LAMBDA LIGHT CHAINS
Kappa free light chain: 24.6 mg/L — ABNORMAL HIGH (ref 3.3–19.4)
Kappa, lambda light chain ratio: 0.29 (ref 0.26–1.65)
Lambda free light chains: 83.5 mg/L — ABNORMAL HIGH (ref 5.7–26.3)

## 2023-05-13 LAB — PROTEIN ELECTROPHORESIS, SERUM
A/G Ratio: 0.9 (ref 0.7–1.7)
Albumin ELP: 3.7 g/dL (ref 2.9–4.4)
Alpha-1-Globulin: 0.2 g/dL (ref 0.0–0.4)
Alpha-2-Globulin: 0.7 g/dL (ref 0.4–1.0)
Beta Globulin: 1.1 g/dL (ref 0.7–1.3)
Gamma Globulin: 1.9 g/dL — ABNORMAL HIGH (ref 0.4–1.8)
Globulin, Total: 4 g/dL — ABNORMAL HIGH (ref 2.2–3.9)
M-Spike, %: 1.5 g/dL — ABNORMAL HIGH
Total Protein ELP: 7.7 g/dL (ref 6.0–8.5)

## 2023-05-14 ENCOUNTER — Inpatient Hospital Stay: Payer: Medicare HMO | Admitting: Hematology

## 2023-05-14 VITALS — BP 141/86 | HR 72 | Temp 96.8°F | Resp 18 | Wt 175.0 lb

## 2023-05-14 DIAGNOSIS — Z87891 Personal history of nicotine dependence: Secondary | ICD-10-CM | POA: Diagnosis not present

## 2023-05-14 DIAGNOSIS — D472 Monoclonal gammopathy: Secondary | ICD-10-CM

## 2023-05-14 DIAGNOSIS — N189 Chronic kidney disease, unspecified: Secondary | ICD-10-CM | POA: Diagnosis not present

## 2023-05-14 DIAGNOSIS — Z86711 Personal history of pulmonary embolism: Secondary | ICD-10-CM | POA: Diagnosis not present

## 2023-05-14 NOTE — Progress Notes (Signed)
 [facility] [address] [place] [address]    Clinic Day:  05/14/2023  Referring physician: Bertell Satterfield, MD  Patient Care Team: Bertell Satterfield, MD as PCP - General (Internal Medicine) [name], MD as PCP - Cardiology (Cardiology) [name], MD as Attending Physician (Gastroenterology) Pcp, No   ASSESSMENT & PLAN:   Assessment: 1.  IgG lambda smoldering multiple myeloma: -BM BX on 07/08/2015 shows normocellular marrow with monoclonal plasmacytosis, 10-20%, 46, XY, myeloma FISH panel negative -Skeletal survey on 06/04/2019 was negative for lytic lesions.   2.  CKD: -Baseline creatinine between 1.4-1.6.    Plan: 1.  IgG lambda smoldering multiple myeloma: - He does not report any recurrent infections.  No new onset pains reported.  Chronic back pain is stable. - Skeletal survey (10/19/2022): No lytic lesions. - Labs from 05/07/2023: Calcium was normal.  M spike is stable at 1.5.  CBC was normal.  Lambda light chains have increased to 83 from 70.  However ratio is normal at 0.29 although downtrending. - No crab features at this time.  RTC 6 months for follow-up with repeat labs.  Will also repeat skeletal survey prior to next visit.   2.  CKD: - Baseline creatinine between 1.3-1.5.  Latest creatinine is 1.47 and stable.    Orders Placed This Encounter  Procedures   DG Bone Survey Met    Standing Status:   Future    Expected Date:   11/11/2023    Expiration Date:   05/13/2024    Reason for Exam (SYMPTOM  OR DIAGNOSIS REQUIRED):   smoldering myeloma    Preferred imaging location?:   [facility]   CBC with Differential    Standing Status:   Future    Expected Date:   11/05/2023    Expiration Date:   05/13/2024   Comprehensive metabolic panel    Standing Status:   Future    Expected Date:   11/05/2023    Expiration Date:   05/13/2024   Kappa/lambda light chains    Standing Status:   Future    Expected Date:   11/05/2023     Expiration Date:   05/13/2024   Protein electrophoresis, serum    Standing Status:   Future    Expected Date:   11/05/2023    Expiration Date:   05/13/2024      LILLETTE Hummingbird R Teague,acting as a scribe for [name], MD.,have documented all relevant documentation on the behalf of [name], MD,as directed by  [name], MD while in the presence of [name], MD.  I, [name] MD, have reviewed the above documentation for accuracy and completeness, and I agree with the above.    [name], MD   1/13/20253:42 PM  CHIEF COMPLAINT:   Diagnosis: smoldering IgG lambda multiple myeloma    Cancer Staging  No matching staging information was found for the patient.    Prior Therapy: none  Current Therapy:  surveillance    HISTORY OF PRESENT ILLNESS:   Oncology History   No history exists.     INTERVAL HISTORY:   Corey Murphy is a 73 y.o. male presenting to clinic today for follow up of smoldering IgG lambda multiple myeloma. He was last seen by me on 10/26/22.  Since his last visit, he was admitted to the hospital from 11/25/22 to 11/27/22 for an acute PE with evidence of RV strain. He was given a heparin  drip while hospitalized and transitioned to  po Eliquis  when discharged. He was seen again in the ED on 03/22/23 for HTN and right leg pain, treated with hydrocodone .   Today, he states that he is doing well overall. His appetite level is at 100%. His energy level is at 75%.  PAST MEDICAL HISTORY:   Past Medical History: Past Medical History:  Diagnosis Date   Alcohol dependency (HCC)    Chronic kidney disease    Colonic adenoma 2006   Essential hypertension    History of cardiac catheterization    03/2013 LM nl, LAD nl, D1/2/3 small - nl, LCX nondom - nl, OM1/2/3 nl, RCA dom   Hypercholesteremia    IgG lambda monoclonal gammopathy 06/24/2015   Nonischemic cardiomyopathy (HCC)    LVEF approximately 40% 06/2013 - improved to  normal range   Obesity    Pulmonary embolism (HCC)    Type 2 diabetes mellitus (HCC)     Surgical History: Past Surgical History:  Procedure Laterality Date   CIRCUMCISION     COLONOSCOPY  05/20/2004   RMR: Internal hemorrhoids.  Diminutive adenomatous polyp in the mid-right colon, otherwise normal   COLONOSCOPY N/A 06/19/2012   Procedure: COLONOSCOPY;  Surgeon: Lamar CHRISTELLA Hollingshead, MD;  Location: [facility];  Service: Endoscopy;  Laterality: N/A;  8:30AM-rescheduled 10:30am Anette Caldron notified pt   COLONOSCOPY N/A 08/08/2017   Surgeon: Hollingshead Lamar CHRISTELLA, MD;  diverticulosis in sigmoid, descending, and transverse colon, three 4-7 mm polyps in ascending colon, and one 9 mm polyp in ascending colon. Pathology with tubular adenomas. Repeat in 3 years.   COLONOSCOPY N/A 04/23/2019   Sigmoid and descending colon diverticulosis, one 5 mm polyp (tubular adenoma) in descending colon, non-bleeding internal hemorrhoids. 5 year surveillance.    Ileocolonoscopy  06/28/2007   MFM:Wnmfjo rectum/Submucosal sigmoid diverticula (chronic), doubt clinical significance Hyperplastic polypoid-appearing fold, mid ascending colon, status/Remainder colonic mucosa and terminal ileum mucosa appeared normal    LEFT AND RIGHT HEART CATHETERIZATION WITH CORONARY ANGIOGRAM  03/19/2013   Procedure: LEFT AND RIGHT HEART CATHETERIZATION WITH CORONARY ANGIOGRAM;  Surgeon: Deatrice DELENA Cage, MD;  Location: [facility] CATH [facility];  Service: Cardiovascular;;   POLYPECTOMY  08/08/2017   Procedure: POLYPECTOMY;  Surgeon: Hollingshead Lamar CHRISTELLA, MD;  Location: [facility];  Service: Endoscopy;;  ascending colon polyps x4 (cold snare-3,hot snare)   TOTAL HIP ARTHROPLASTY  2010   Left   TOTAL HIP ARTHROPLASTY  2010   Right    Social History: Social History   Socioeconomic History   Marital status: Married    Spouse name: Not on file   Number of children: 2   Years of education: Not on file   Highest education level: Not on file  Occupational  History   Occupation: Retired, set designer  Tobacco Use   Smoking status: Former    Current packs/day: 0.00    Average packs/day: 1 pack/day for 12.0 years (12.0 ttl pk-yrs)    Types: Cigarettes    Start date: 05/28/1996    Quit date: 05/28/2008    Years since quitting: 14.9   Smokeless tobacco: Never  Vaping Use   Vaping status: Never Used  Substance and Sexual Activity   Alcohol use: Yes    Comment: Occasionally 2 to 3 times a month   Drug use: Not Currently    Types: Marijuana    Comment: Smoked via a bowl occassionally.    Sexual activity: Not on file  Other Topics Concern   Not on file  Social History Narrative   Not  on file   Social Drivers of Health   Financial Resource Strain: Not on file  Food Insecurity: No Food Insecurity (11/26/2022)   Hunger Vital Sign    Worried About Running Out of Food in the Last Year: Never true    Ran Out of Food in the Last Year: Never true  Transportation Needs: No Transportation Needs (11/26/2022)   PRAPARE - Administrator, Civil Service (Medical): No    Lack of Transportation (Non-Medical): No  Physical Activity: Inactive (04/14/2020)   Exercise Vital Sign    Days of Exercise per Week: 0 days    Minutes of Exercise per Session: 0 min  Stress: No Stress Concern Present (04/14/2020)   [employer] of Occupational Health - Occupational Stress Questionnaire    Feeling of Stress : Not at all  Social Connections: Moderately Integrated (04/14/2020)   Social Connection and Isolation Panel [NHANES]    Frequency of Communication with Friends and Family: More than three times a week    Frequency of Social Gatherings with Friends and Family: Three times a week    Attends Religious Services: 1 to 4 times per year    Active Member of Clubs or Organizations: No    Attends Banker Meetings: Never    Marital Status: Married  Catering Manager Violence: Not At Risk (11/26/2022)   Humiliation, Afraid, Rape, and Kick  questionnaire    Fear of Current or Ex-Partner: No    Emotionally Abused: No    Physically Abused: No    Sexually Abused: No    Family History: Family History  Problem Relation Age of Onset   Heart failure Mother    Diabetes Mother    Hypertension Mother    Heart failure Sister    Hypertension Sister    Cancer Sister    Colon cancer Neg Hx     Current Medications:  Current Outpatient Medications:    carvedilol  (COREG ) 3.125 MG tablet, Take 1 tablet (3.125 mg total) by mouth 2 (two) times daily., Disp: 180 tablet, Rfl: 3   glimepiride  (AMARYL ) 4 MG tablet, Take 4 mg by mouth 2 (two) times daily., Disp: , Rfl:    HYDROcodone -acetaminophen  (NORCO) 10-325 MG tablet, Take 1 tablet by mouth every 6 (six) hours., Disp: , Rfl:    Multiple Vitamins-Iron (MULTIVITAMIN/IRON PO), Take 1 tablet by mouth daily. , Disp: , Rfl:    ONETOUCH ULTRA test strip, 1 each 2 (two) times daily., Disp: , Rfl:    simvastatin  (ZOCOR ) 20 MG tablet, Take 20 mg by mouth daily. , Disp: , Rfl: 3   spironolactone  (ALDACTONE ) 25 MG tablet, TAKE 1 TABLET (25 MG TOTAL) BY MOUTH DAILY., Disp: 90 tablet, Rfl: 3   traZODone  (DESYREL ) 50 MG tablet, Take 75 mg by mouth at bedtime., Disp: , Rfl:    VISINE 0.05 % ophthalmic solution, SMARTSIG:1 Drop(s) In Eye(s) PRN, Disp: , Rfl:    zolpidem  (AMBIEN ) 10 MG tablet, Take 5 mg by mouth at bedtime., Disp: , Rfl:    apixaban  (ELIQUIS ) 5 MG TABS tablet, Take 2 tablets (10 mg total) by mouth 2 (two) times daily for 7 days, THEN 1 tablet (5 mg total) 2 (two) times daily., Disp: 88 tablet, Rfl: 0   furosemide  (LASIX ) 20 MG tablet, TAKE 1 TABLET (20 MG TOTAL) BY MOUTH AS NEEDED (FOR WEIGHT GAIN OF 2-3 LBS OVERNIGHT OR 5 LBS IN ONE WEEK)., Disp: 90 tablet, Rfl: 3   Allergies: Allergies  Allergen Reactions  Peanut-Containing Drug Products Rash    *Cashews    REVIEW OF SYSTEMS:   Review of Systems  Constitutional:  Negative for chills, fatigue and fever.  HENT:   Negative for  lump/mass, mouth sores, nosebleeds, sore throat and trouble swallowing.   Eyes:  Negative for eye problems.  Respiratory:  Negative for cough and shortness of breath.   Cardiovascular:  Negative for chest pain, leg swelling and palpitations.  Gastrointestinal:  Negative for abdominal pain, constipation, diarrhea, nausea and vomiting.  Genitourinary:  Negative for bladder incontinence, difficulty urinating, dysuria, frequency, hematuria and nocturia.   Musculoskeletal:  Negative for arthralgias, back pain, flank pain, myalgias and neck pain.  Skin:  Negative for itching and rash.  Neurological:  Negative for dizziness, headaches and numbness.  Hematological:  Does not bruise/bleed easily.  Psychiatric/Behavioral:  Negative for depression, sleep disturbance and suicidal ideas. The patient is not nervous/anxious.   All other systems reviewed and are negative.    VITALS:   Blood pressure (!) 141/86, pulse 72, temperature (!) 96.8 F (36 C), temperature source Tympanic, resp. rate 18, weight 175 lb 0.7 oz (79.4 kg), SpO2 99%.  Wt Readings from Last 3 Encounters:  05/14/23 175 lb 0.7 oz (79.4 kg)  04/10/23 170 lb 12.8 oz (77.5 kg)  03/22/23 163 lb (73.9 kg)    Body mass index is 26.62 kg/m.  Performance status (ECOG): 1 - Symptomatic but completely ambulatory  PHYSICAL EXAM:   Physical Exam Vitals and nursing note reviewed. Exam conducted with a chaperone present.  Constitutional:      Appearance: Normal appearance.  Cardiovascular:     Rate and Rhythm: Normal rate and regular rhythm.     Pulses: Normal pulses.     Heart sounds: Normal heart sounds.  Pulmonary:     Effort: Pulmonary effort is normal.     Breath sounds: Normal breath sounds.  Abdominal:     Palpations: Abdomen is soft. There is no hepatomegaly, splenomegaly or mass.     Tenderness: There is no abdominal tenderness.  Musculoskeletal:     Right lower leg: No edema.     Left lower leg: No edema.  Lymphadenopathy:      Cervical: No cervical adenopathy.     Right cervical: No superficial, deep or posterior cervical adenopathy.    Left cervical: No superficial, deep or posterior cervical adenopathy.     Upper Body:     Right upper body: No supraclavicular or axillary adenopathy.     Left upper body: No supraclavicular or axillary adenopathy.  Neurological:     General: No focal deficit present.     Mental Status: He is alert and oriented to person, place, and time.  Psychiatric:        Mood and Affect: Mood normal.        Behavior: Behavior normal.     LABS:      Latest Ref Rng & Units 05/07/2023   12:01 PM 03/22/2023    2:49 AM 11/27/2022    4:14 AM  CBC  WBC 4.0 - 10.5 K/uL 5.1  5.6  6.3   Hemoglobin 13.0 - 17.0 g/dL 85.8  86.7  86.5   Hematocrit 39.0 - 52.0 % 44.0  38.6  41.1   Platelets 150 - 400 K/uL 230  246  198       Latest Ref Rng & Units 05/07/2023   12:01 PM 03/22/2023    2:49 AM 11/27/2022    4:14 AM  CMP  Glucose  70 - 99 mg/dL 99  840  864   BUN 8 - 23 mg/dL 22  19  21    Creatinine 0.61 - 1.24 mg/dL 8.52  8.56  8.69   Sodium 135 - 145 mmol/L 135  137  134   Potassium 3.5 - 5.1 mmol/L 3.4  3.2  3.8   Chloride 98 - 111 mmol/L 104  106  106   CO2 22 - 32 mmol/L 25  24  20    Calcium 8.9 - 10.3 mg/dL 8.8  8.4  8.6   Total Protein 6.5 - 8.1 g/dL 8.1     Total Bilirubin 0.0 - 1.2 mg/dL 0.4     Alkaline Phos 38 - 126 U/L 72     AST 15 - 41 U/L 20     ALT 0 - 44 U/L 28        No results found for: CEA1, CEA / No results found for: CEA1, CEA No results found for: PSA1 No results found for: CAN199 No results found for: RJW874  Lab Results  Component Value Date   TOTALPROTELP 7.7 05/07/2023   ALBUMINELP 3.7 05/07/2023   A1GS 0.2 05/07/2023   A2GS 0.7 05/07/2023   BETS 1.1 05/07/2023   GAMS 1.9 (H) 05/07/2023   MSPIKE 1.5 (H) 05/07/2023   SPEI Comment 05/07/2023   Lab Results  Component Value Date   TIBC 381 07/05/2015   FERRITIN 79 07/05/2015    IRONPCTSAT 24 07/05/2015   Lab Results  Component Value Date   LDH 89 (L) 10/19/2022   LDH 86 (L) 04/07/2020   LDH 88 (L) 12/05/2019     STUDIES:   No results found.

## 2023-05-14 NOTE — Patient Instructions (Signed)

## 2023-05-21 ENCOUNTER — Ambulatory Visit: Payer: Medicare HMO | Attending: Internal Medicine

## 2023-05-21 DIAGNOSIS — I10 Essential (primary) hypertension: Secondary | ICD-10-CM

## 2023-05-21 NOTE — Progress Notes (Signed)
   Please let the patient know his 1st degree AV block is still present but has improved with dose reduction of Coreg. Has he experienced any lightheadedness, dizziness or presyncope? If not, can continue low-dose Coreg. If he has experienced symptoms, would stop Coreg and we can switch to a different medication to help with BP (already on Spironolactone). Was previously on Lisinopril and we could restart this if needed as it does not impact his HR.   Signed, Ellsworth Lennox, PA-C 05/21/2023, 6:50 PM Pager: [phone]

## 2023-05-21 NOTE — Progress Notes (Signed)
Patient presents today for nurse visit- EKG. Pt's Coreg was adjusted at last ov with provider. Pt stated that he has been feeling okay, but the top number of his bp is higher. Pt stated that bp usually runs 110-115, now runs 135-140 range.    HR on EKG- 71 bpm.

## 2023-05-29 DIAGNOSIS — E119 Type 2 diabetes mellitus without complications: Secondary | ICD-10-CM | POA: Diagnosis not present

## 2023-05-30 DIAGNOSIS — I5032 Chronic diastolic (congestive) heart failure: Secondary | ICD-10-CM | POA: Diagnosis not present

## 2023-05-30 DIAGNOSIS — I2699 Other pulmonary embolism without acute cor pulmonale: Secondary | ICD-10-CM | POA: Diagnosis not present

## 2023-06-04 DIAGNOSIS — C9000 Multiple myeloma not having achieved remission: Secondary | ICD-10-CM | POA: Diagnosis not present

## 2023-06-04 DIAGNOSIS — I2699 Other pulmonary embolism without acute cor pulmonale: Secondary | ICD-10-CM | POA: Diagnosis not present

## 2023-06-04 DIAGNOSIS — N183 Chronic kidney disease, stage 3 unspecified: Secondary | ICD-10-CM | POA: Diagnosis not present

## 2023-06-04 DIAGNOSIS — R778 Other specified abnormalities of plasma proteins: Secondary | ICD-10-CM | POA: Diagnosis not present

## 2023-06-04 DIAGNOSIS — E1122 Type 2 diabetes mellitus with diabetic chronic kidney disease: Secondary | ICD-10-CM | POA: Diagnosis not present

## 2023-06-04 DIAGNOSIS — I129 Hypertensive chronic kidney disease with stage 1 through stage 4 chronic kidney disease, or unspecified chronic kidney disease: Secondary | ICD-10-CM | POA: Diagnosis not present

## 2023-06-04 DIAGNOSIS — I10 Essential (primary) hypertension: Secondary | ICD-10-CM | POA: Diagnosis not present

## 2023-06-04 DIAGNOSIS — E119 Type 2 diabetes mellitus without complications: Secondary | ICD-10-CM | POA: Diagnosis not present

## 2023-06-06 DIAGNOSIS — I2699 Other pulmonary embolism without acute cor pulmonale: Secondary | ICD-10-CM | POA: Diagnosis not present

## 2023-07-04 DIAGNOSIS — I2699 Other pulmonary embolism without acute cor pulmonale: Secondary | ICD-10-CM | POA: Diagnosis not present

## 2023-08-01 DIAGNOSIS — I2699 Other pulmonary embolism without acute cor pulmonale: Secondary | ICD-10-CM | POA: Diagnosis not present

## 2023-08-29 ENCOUNTER — Other Ambulatory Visit: Payer: Self-pay | Admitting: Cardiology

## 2023-08-29 DIAGNOSIS — I130 Hypertensive heart and chronic kidney disease with heart failure and stage 1 through stage 4 chronic kidney disease, or unspecified chronic kidney disease: Secondary | ICD-10-CM | POA: Diagnosis not present

## 2023-08-29 DIAGNOSIS — I10 Essential (primary) hypertension: Secondary | ICD-10-CM | POA: Diagnosis not present

## 2023-08-29 DIAGNOSIS — I2510 Atherosclerotic heart disease of native coronary artery without angina pectoris: Secondary | ICD-10-CM | POA: Diagnosis not present

## 2023-09-10 DIAGNOSIS — I2699 Other pulmonary embolism without acute cor pulmonale: Secondary | ICD-10-CM | POA: Diagnosis not present

## 2023-09-28 DIAGNOSIS — R2240 Localized swelling, mass and lump, unspecified lower limb: Secondary | ICD-10-CM | POA: Diagnosis not present

## 2023-09-28 DIAGNOSIS — I429 Cardiomyopathy, unspecified: Secondary | ICD-10-CM | POA: Diagnosis not present

## 2023-09-28 DIAGNOSIS — E1122 Type 2 diabetes mellitus with diabetic chronic kidney disease: Secondary | ICD-10-CM | POA: Diagnosis not present

## 2023-09-28 DIAGNOSIS — I130 Hypertensive heart and chronic kidney disease with heart failure and stage 1 through stage 4 chronic kidney disease, or unspecified chronic kidney disease: Secondary | ICD-10-CM | POA: Diagnosis not present

## 2023-09-28 DIAGNOSIS — E1129 Type 2 diabetes mellitus with other diabetic kidney complication: Secondary | ICD-10-CM | POA: Diagnosis not present

## 2023-09-28 DIAGNOSIS — E1140 Type 2 diabetes mellitus with diabetic neuropathy, unspecified: Secondary | ICD-10-CM | POA: Diagnosis not present

## 2023-09-28 DIAGNOSIS — N1831 Chronic kidney disease, stage 3a: Secondary | ICD-10-CM | POA: Diagnosis not present

## 2023-09-28 DIAGNOSIS — Z6827 Body mass index (BMI) 27.0-27.9, adult: Secondary | ICD-10-CM | POA: Diagnosis not present

## 2023-09-28 DIAGNOSIS — I10 Essential (primary) hypertension: Secondary | ICD-10-CM | POA: Diagnosis not present

## 2023-09-28 DIAGNOSIS — I5032 Chronic diastolic (congestive) heart failure: Secondary | ICD-10-CM | POA: Diagnosis not present

## 2023-09-28 DIAGNOSIS — E663 Overweight: Secondary | ICD-10-CM | POA: Diagnosis not present

## 2023-10-03 ENCOUNTER — Encounter: Payer: Self-pay | Admitting: *Deleted

## 2023-10-08 DIAGNOSIS — I2699 Other pulmonary embolism without acute cor pulmonale: Secondary | ICD-10-CM | POA: Diagnosis not present

## 2023-10-16 ENCOUNTER — Ambulatory Visit: Payer: Medicare HMO | Attending: Cardiology | Admitting: Cardiology

## 2023-10-16 ENCOUNTER — Encounter: Payer: Self-pay | Admitting: Cardiology

## 2023-10-16 VITALS — BP 122/82 | HR 72 | Ht 68.0 in | Wt 177.2 lb

## 2023-10-16 DIAGNOSIS — I440 Atrioventricular block, first degree: Secondary | ICD-10-CM

## 2023-10-16 DIAGNOSIS — I5032 Chronic diastolic (congestive) heart failure: Secondary | ICD-10-CM | POA: Diagnosis not present

## 2023-10-16 DIAGNOSIS — Z86711 Personal history of pulmonary embolism: Secondary | ICD-10-CM

## 2023-10-16 DIAGNOSIS — E782 Mixed hyperlipidemia: Secondary | ICD-10-CM

## 2023-10-16 NOTE — Progress Notes (Signed)
    Cardiology Office Note  Date: 10/16/2023   ID: Jack Bolio, DOB [birth year], MRN [id number]  History of Present Illness: Corey Murphy is a 73 y.o. male last seen in December 2024 by Steva Ek, I reviewed her note.  He is here today with his wife for a follow-up visit.  I reviewed interval records.  He reports no angina, describes NYHA class II dyspnea, no palpitations or syncope.  We went over his medications.  He is on Coumadin currently followed at [facility] for treatment of pulmonary emboli diagnosed in July of last year.  Coreg  has been reduced to 3.125 mg twice daily, tolerating well with heart rate in the 70s today at rest.  States that his systolic is around 130 at home.  He had been on lisinopril  previously, but this was discontinued with hypotension during his hospitalization in July last year.  I reviewed his echocardiogram from July 2024, LVEF still normal range at 55 to 60%.  Physical Exam: VS:  BP 122/82   Pulse 72   Ht 5' 8 (1.727 m)   Wt 177 lb 3.2 oz (80.4 kg)   SpO2 97%   BMI 26.94 kg/m , BMI Body mass index is 26.94 kg/m.  Wt Readings from Last 3 Encounters:  10/16/23 177 lb 3.2 oz (80.4 kg)  05/14/23 175 lb 0.7 oz (79.4 kg)  04/10/23 170 lb 12.8 oz (77.5 kg)    General: Patient appears comfortable at rest. HEENT: Conjunctiva and lids normal. Neck: Supple, no elevated JVP or carotid bruits. Lungs: Clear to auscultation, nonlabored breathing at rest. Cardiac: Regular rate and rhythm, no S3 or significant systolic murmur, no pericardial rub. Extremities: No pitting edema.  ECG:  An ECG dated 05/21/2023 was personally reviewed today and demonstrated:  Sinus rhythm with prolonged PR interval.  Labwork: 11/25/2022: B Natriuretic Peptide 192.0 11/27/2022: Magnesium  2.0 05/07/2023: ALT 28; AST 20; BUN 22; Creatinine, Ser 1.47; Hemoglobin 14.1; Platelets 230; Potassium 3.4; Sodium 135   Other Studies Reviewed Today:  No interval cardiac testing for review  today.  Assessment and Plan:  1.  HFrecEF with nonischemic cardiomyopathy, LVEF 55 to 60% by follow-up echocardiogram in July 2024.  Symptomatically stable with NYHA class II dyspnea.  Medications at this time include Coreg  3.125 mg twice daily, Lasix  20 mg as needed for edema, and Aldactone  25 mg daily.   2.  Primary hypertension.  Continue to track blood pressure at home.  Might consider addition of low-dose losartan if systolics increase further.   3.  Mixed hyperlipidemia.  Continue Zocor  20 mg daily and follow-up by PCP.   4.  CKD stage IIIa.  Creatinine 1.47 with GFR 51 in January.  5.  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  Coreg  dose has been reduced.  He is asymptomatic, would continue observation for now.  6.  History of extensive bilateral acute pulmonary emboli diagnosed in July 2024.  Given this and other comorbidities including IgG lambda smoldering multiple myeloma, would recommend long-term anticoagulation to reduce recurrent thromboembolic event rate.  He is on Coumadin with follow-up by [facility], requests transition to our anticoagulation clinic eventually when this practice closes.  He is also working on assistance program to get back on Eliquis .  Disposition:  Follow up 6 months.  Signed, Gerard Knight, M.D., F.A.C.C. [facility] at [facility]

## 2023-10-16 NOTE — Patient Instructions (Signed)
 Medication Instructions:  Your physician recommends that you continue on your current medications as directed. Please refer to the Current Medication list given to you today.  *If you need a refill on your cardiac medications before your next appointment, please call your pharmacy*  Lab Work: None If you have labs (blood work) drawn today and your tests are completely normal, you will receive your results only by: MyChart Message (if you have MyChart) OR A paper copy in the mail If you have any lab test that is abnormal or we need to change your treatment, we will call you to review the results.  Testing/Procedures: None  Follow-Up: At [facility], you and your health needs are our priority.  As part of our continuing mission to provide you with exceptional heart care, our providers are all part of one team.  This team includes your primary Cardiologist (physician) and Advanced Practice Providers or APPs (Physician Assistants and Nurse Practitioners) who all work together to provide you with the care you need, when you need it.  Your next appointment:   6 month(s)  Provider:   You may see Teddie Favre, MD or one of the following Advanced Practice Providers on your designated Care Team:   Woodfin Hays, PA-C  [place], [place] Theotis Flake, [place]     We recommend signing up for the patient portal called "MyChart".  Sign up information is provided on this After Visit Summary.  MyChart is used to connect with patients for Virtual Visits (Telemedicine).  Patients are able to view lab/test results, encounter notes, upcoming appointments, etc.  Non-urgent messages can be sent to your provider as well.   To learn more about what you can do with MyChart, go to [email or web address].   Other Instructions

## 2023-10-25 ENCOUNTER — Ambulatory Visit (INDEPENDENT_AMBULATORY_CARE_PROVIDER_SITE_OTHER): Admitting: Family Medicine

## 2023-10-25 VITALS — BP 135/82 | HR 75 | Temp 98.1°F | Ht 68.0 in | Wt 173.8 lb

## 2023-10-25 DIAGNOSIS — E1122 Type 2 diabetes mellitus with diabetic chronic kidney disease: Secondary | ICD-10-CM | POA: Diagnosis not present

## 2023-10-25 DIAGNOSIS — I10 Essential (primary) hypertension: Secondary | ICD-10-CM | POA: Diagnosis not present

## 2023-10-25 DIAGNOSIS — E782 Mixed hyperlipidemia: Secondary | ICD-10-CM | POA: Diagnosis not present

## 2023-10-25 DIAGNOSIS — Z7984 Long term (current) use of oral hypoglycemic drugs: Secondary | ICD-10-CM | POA: Diagnosis not present

## 2023-10-25 DIAGNOSIS — Z86711 Personal history of pulmonary embolism: Secondary | ICD-10-CM | POA: Diagnosis not present

## 2023-10-25 DIAGNOSIS — I5032 Chronic diastolic (congestive) heart failure: Secondary | ICD-10-CM

## 2023-10-25 DIAGNOSIS — N1831 Chronic kidney disease, stage 3a: Secondary | ICD-10-CM | POA: Diagnosis not present

## 2023-10-25 NOTE — Patient Instructions (Signed)
 Labs today.  Follow up in 6 months.

## 2023-10-26 LAB — HEMOGLOBIN A1C
Est. average glucose Bld gHb Est-mCnc: 137 mg/dL
Hgb A1c MFr Bld: 6.4 % — ABNORMAL HIGH (ref 4.8–5.6)

## 2023-10-26 LAB — MICROALBUMIN / CREATININE URINE RATIO
Creatinine, Urine: 217.1 mg/dL
Microalb/Creat Ratio: 39 mg/g{creat} — ABNORMAL HIGH (ref 0–29)
Microalbumin, Urine: 84.1 ug/mL

## 2023-10-26 LAB — CMP14+EGFR
ALT: 22 IU/L (ref 0–44)
AST: 19 IU/L (ref 0–40)
Albumin: 4.3 g/dL (ref 3.8–4.8)
Alkaline Phosphatase: 98 IU/L (ref 44–121)
BUN/Creatinine Ratio: 14 (ref 10–24)
BUN: 19 mg/dL (ref 8–27)
Bilirubin Total: 0.4 mg/dL (ref 0.0–1.2)
CO2: 25 mmol/L (ref 20–29)
Calcium: 9.5 mg/dL (ref 8.6–10.2)
Chloride: 98 mmol/L (ref 96–106)
Creatinine, Ser: 1.4 mg/dL — ABNORMAL HIGH (ref 0.76–1.27)
Globulin, Total: 4 g/dL (ref 1.5–4.5)
Glucose: 109 mg/dL — ABNORMAL HIGH (ref 70–99)
Potassium: 3.8 mmol/L (ref 3.5–5.2)
Sodium: 137 mmol/L (ref 134–144)
Total Protein: 8.3 g/dL (ref 6.0–8.5)
eGFR: 54 mL/min/{1.73_m2} — ABNORMAL LOW (ref >59)

## 2023-10-26 LAB — LIPID PANEL
Chol/HDL Ratio: 2.5 ratio (ref 0.0–5.0)
Cholesterol, Total: 169 mg/dL (ref 100–199)
HDL: 67 mg/dL (ref >39)
LDL Chol Calc (NIH): 87 mg/dL (ref 0–99)
Triglycerides: 82 mg/dL (ref 0–149)
VLDL Cholesterol Cal: 15 mg/dL (ref 5–40)

## 2023-10-28 ENCOUNTER — Ambulatory Visit: Payer: Self-pay | Admitting: Family Medicine

## 2023-10-28 MED ORDER — CARVEDILOL 3.125 MG PO TABS: 3.1250 mg | ORAL_TABLET | Freq: Two times a day (BID) | ORAL | 3 refills | Status: AC

## 2023-10-28 NOTE — Progress Notes (Signed)
 Subjective:  Patient ID: Corey Murphy, male    DOB: 1951/01/31  Age: 73 y.o. MRN: [id number]  CC: Establish care   HPI:  73 year old male with extensive medical history as seen below presents to establish care.  Patient's physician is retiring and he is here to establish care.  Has a history of heart failure.  Blood pressure well-controlled.  He is on Lasix , spironolactone , and carvedilol .  Last ejection fraction was normal (recovered).  Needs A1c.  Unsure of the status of his current A1c.  He is on Amaryl .  Not on metformin due to renal function.  Sees nephrology.  Patient has a history of pulmonary embolism.  He is on lifelong warfarin.  He states that he is interested in Eliquis  but cost has been an issue previously.  He would like me to reach out to my pharmacist.    Patient Active Problem List   Diagnosis Date Noted   Heart failure with recovered ejection fraction (HFrecEF) (HCC) 10/25/2023   History of pulmonary embolism 10/25/2023   Type 2 diabetes mellitus with diabetic chronic kidney disease (HCC) 10/25/2023   Mixed hyperlipidemia 11/26/2022   Smoldering myeloma 08/03/2015   CKD (chronic kidney disease) stage 3, GFR 30-59 ml/min (HCC) 06/06/2013   Obesity 03/22/2013   Essential hypertension, benign     Social Hx   Social History   Socioeconomic History   Marital status: Married    Spouse name: Not on file   Number of children: 2   Years of education: Not on file   Highest education level: Master's degree (e.g., MA, MS, MEng, MEd, MSW, MBA)  Occupational History   Occupation: Retired, Set designer  Tobacco Use   Smoking status: Former    Current packs/day: 0.00    Average packs/day: 1 pack/day for 12.0 years (12.0 ttl pk-yrs)    Types: Cigarettes    Start date: 05/28/1996    Quit date: 05/28/2008    Years since quitting: 15.4   Smokeless tobacco: Never  Vaping Use   Vaping status: Never Used  Substance and Sexual Activity   Alcohol use: Yes    Comment:  Occasionally 2 to 3 times a month   Drug use: Not Currently    Types: Marijuana    Comment: Smoked via a bowl occassionally.    Sexual activity: Not on file  Other Topics Concern   Not on file  Social History Narrative   Not on file   Social Drivers of Health   Financial Resource Strain: Low Risk  (10/24/2023)   Overall Financial Resource Strain (CARDIA)    Difficulty of Paying Living Expenses: Not hard at all  Food Insecurity: No Food Insecurity (10/24/2023)   Hunger Vital Sign    Worried About Running Out of Food in the Last Year: Never true    Ran Out of Food in the Last Year: Never true  Transportation Needs: No Transportation Needs (10/24/2023)   PRAPARE - Administrator, Civil Service (Medical): No    Lack of Transportation (Non-Medical): No  Physical Activity: Sufficiently Active (10/24/2023)   Exercise Vital Sign    Days of Exercise per Week: 6 days    Minutes of Exercise per Session: 90 min  Stress: No Stress Concern Present (10/24/2023)   [employer] of Occupational Health - Occupational Stress Questionnaire    Feeling of Stress: Not at all  Social Connections: Moderately Integrated (10/24/2023)   Social Connection and Isolation Panel    Frequency of Communication with Friends and  Family: Once a week    Frequency of Social Gatherings with Friends and Family: Once a week    Attends Religious Services: 1 to 4 times per year    Active Member of [employer] or Organizations: Yes    Attends Engineer, structural: More than 4 times per year    Marital Status: Married    Review of Systems Per HPI  Objective:  BP 135/82   Pulse 75   Temp 98.1 F (36.7 C)   Ht 5' 8 (1.727 m)   Wt 173 lb 12.8 oz (78.8 kg)   SpO2 99%   BMI 26.43 kg/m      10/25/2023    2:30 PM 10/25/2023    2:25 PM 10/16/2023   11:39 AM  BP/Weight  Systolic BP 135 143 122  Diastolic BP 82 83 82  Wt. (Lbs)  173.8 177.2  BMI  26.43 kg/m2 26.94 kg/m2    Physical Exam Vitals  and nursing note reviewed.  Constitutional:      General: He is not in acute distress.    Appearance: Normal appearance.  HENT:     Head: Normocephalic and atraumatic.   Eyes:     General:        Right eye: No discharge.        Left eye: No discharge.     Conjunctiva/sclera: Conjunctivae normal.    Cardiovascular:     Rate and Rhythm: Normal rate and regular rhythm.  Pulmonary:     Effort: Pulmonary effort is normal.     Breath sounds: Normal breath sounds. No wheezing, rhonchi or rales.   Neurological:     Mental Status: He is alert.   Psychiatric:        Mood and Affect: Mood normal.        Behavior: Behavior normal.     Lab Results  Component Value Date   WBC 5.1 05/07/2023   HGB 14.1 05/07/2023   HCT 44.0 05/07/2023   PLT 230 05/07/2023   GLUCOSE 109 (H) 10/25/2023   CHOL 169 10/25/2023   TRIG 82 10/25/2023   HDL 67 10/25/2023   LDLCALC 87 10/25/2023   ALT 22 10/25/2023   AST 19 10/25/2023   NA 137 10/25/2023   K 3.8 10/25/2023   CL 98 10/25/2023   CREATININE 1.40 (H) 10/25/2023   BUN 19 10/25/2023   CO2 25 10/25/2023   TSH 1.426 03/17/2013   INR 1.05 07/08/2015   HGBA1C 6.4 (H) 10/25/2023     Assessment & Plan:  Essential hypertension, benign Assessment & Plan: Stable. Continue current medications.  Orders: -     Carvedilol ; Take 1 tablet (3.125 mg total) by mouth 2 (two) times daily.  Dispense: 180 tablet; Refill: 3  Heart failure with recovered ejection fraction (HFrecEF) (HCC) Assessment & Plan: Stable. Continue current medications.  Orders: -     Carvedilol ; Take 1 tablet (3.125 mg total) by mouth 2 (two) times daily.  Dispense: 180 tablet; Refill: 3  History of pulmonary embolism Assessment & Plan: Interested in switch to Warfarin. Reaching out to my pharmacist to see if we can help with cost.   Type 2 diabetes mellitus with stage 3a chronic kidney disease, without long-term current use of insulin  (HCC) Assessment & Plan: A1c today  to assess.  Orders: -     CMP14+EGFR -     Microalbumin / creatinine urine ratio -     Hemoglobin A1c  Mixed hyperlipidemia Assessment & Plan: Lipid panel to  assess. Currently on Simvastatin .  Orders: -     Lipid panel    Follow-up:  6 months  Jene Huq Bluford DO [facility]

## 2023-10-28 NOTE — Assessment & Plan Note (Signed)
 Lipid panel to assess. Currently on Simvastatin .

## 2023-10-28 NOTE — Assessment & Plan Note (Signed)
 Stable.  Continue current medications.

## 2023-10-28 NOTE — Assessment & Plan Note (Signed)
A1c today to assess. 

## 2023-10-28 NOTE — Assessment & Plan Note (Signed)
 Interested in switch to Warfarin. Reaching out to my pharmacist to see if we can help with cost.

## 2023-10-29 ENCOUNTER — Other Ambulatory Visit: Payer: Self-pay | Admitting: *Deleted

## 2023-10-29 ENCOUNTER — Other Ambulatory Visit: Payer: Self-pay | Admitting: Family Medicine

## 2023-10-29 DIAGNOSIS — R2241 Localized swelling, mass and lump, right lower limb: Secondary | ICD-10-CM

## 2023-10-29 DIAGNOSIS — D472 Monoclonal gammopathy: Secondary | ICD-10-CM

## 2023-10-29 MED ORDER — EMPAGLIFLOZIN 10 MG PO TABS
10.0000 mg | ORAL_TABLET | Freq: Every day | ORAL | 1 refills | Status: DC
Start: 2023-10-29 — End: 2023-11-15

## 2023-10-29 MED ORDER — SIMVASTATIN 40 MG PO TABS: 40.0000 mg | ORAL_TABLET | Freq: Every day | ORAL | 3 refills | Status: DC

## 2023-10-30 ENCOUNTER — Ambulatory Visit: Admitting: General Surgery

## 2023-10-30 ENCOUNTER — Encounter: Payer: Self-pay | Admitting: General Surgery

## 2023-10-30 ENCOUNTER — Ambulatory Visit (HOSPITAL_COMMUNITY)
Admission: RE | Admit: 2023-10-30 | Discharge: 2023-10-30 | Disposition: A | Discharge status: Home / Self Care | Source: Ambulatory Visit | Attending: Hematology | Admitting: Hematology

## 2023-10-30 ENCOUNTER — Inpatient Hospital Stay: Attending: Hematology

## 2023-10-30 VITALS — BP 145/81 | HR 71 | Temp 98.4°F | Resp 14 | Ht 68.0 in | Wt 176.0 lb

## 2023-10-30 DIAGNOSIS — C9000 Multiple myeloma not having achieved remission: Secondary | ICD-10-CM | POA: Diagnosis not present

## 2023-10-30 DIAGNOSIS — M4802 Spinal stenosis, cervical region: Secondary | ICD-10-CM | POA: Diagnosis not present

## 2023-10-30 DIAGNOSIS — Z87891 Personal history of nicotine dependence: Secondary | ICD-10-CM | POA: Insufficient documentation

## 2023-10-30 DIAGNOSIS — M47812 Spondylosis without myelopathy or radiculopathy, cervical region: Secondary | ICD-10-CM | POA: Diagnosis not present

## 2023-10-30 DIAGNOSIS — N189 Chronic kidney disease, unspecified: Secondary | ICD-10-CM | POA: Insufficient documentation

## 2023-10-30 DIAGNOSIS — Z86711 Personal history of pulmonary embolism: Secondary | ICD-10-CM | POA: Diagnosis not present

## 2023-10-30 DIAGNOSIS — R2241 Localized swelling, mass and lump, right lower limb: Secondary | ICD-10-CM | POA: Diagnosis not present

## 2023-10-30 DIAGNOSIS — D472 Monoclonal gammopathy: Secondary | ICD-10-CM | POA: Diagnosis not present

## 2023-10-30 DIAGNOSIS — Z7901 Long term (current) use of anticoagulants: Secondary | ICD-10-CM | POA: Insufficient documentation

## 2023-10-30 DIAGNOSIS — Z96643 Presence of artificial hip joint, bilateral: Secondary | ICD-10-CM | POA: Diagnosis not present

## 2023-10-30 LAB — COMPREHENSIVE METABOLIC PANEL WITH GFR
ALT: 28 U/L (ref 0–44)
AST: 20 U/L (ref 15–41)
Albumin: 3.5 g/dL (ref 3.5–5.0)
Alkaline Phosphatase: 74 U/L (ref 38–126)
Anion gap: 8 (ref 5–15)
BUN: 16 mg/dL (ref 8–23)
CO2: 26 mmol/L (ref 22–32)
Calcium: 8.7 mg/dL — ABNORMAL LOW (ref 8.9–10.3)
Chloride: 102 mmol/L (ref 98–111)
Creatinine, Ser: 1.33 mg/dL — ABNORMAL HIGH (ref 0.61–1.24)
GFR, Estimated: 57 mL/min — ABNORMAL LOW (ref >60)
Glucose, Bld: 120 mg/dL — ABNORMAL HIGH (ref 70–99)
Potassium: 3.6 mmol/L (ref 3.5–5.1)
Sodium: 136 mmol/L (ref 135–145)
Total Bilirubin: 0.6 mg/dL (ref 0.0–1.2)
Total Protein: 8 g/dL (ref 6.5–8.1)

## 2023-10-30 LAB — CBC WITH DIFFERENTIAL/PLATELET
Abs Immature Granulocytes: 0.01 10*3/uL (ref 0.00–0.07)
Basophils Absolute: 0 10*3/uL (ref 0.0–0.1)
Basophils Relative: 1 %
Eosinophils Absolute: 0.1 10*3/uL (ref 0.0–0.5)
Eosinophils Relative: 2 %
HCT: 42.3 % (ref 39.0–52.0)
Hemoglobin: 13.9 g/dL (ref 13.0–17.0)
Immature Granulocytes: 0 %
Lymphocytes Relative: 45 %
Lymphs Abs: 2.3 10*3/uL (ref 0.7–4.0)
MCH: 30.8 pg (ref 26.0–34.0)
MCHC: 32.9 g/dL (ref 30.0–36.0)
MCV: 93.6 fL (ref 80.0–100.0)
Monocytes Absolute: 0.7 10*3/uL (ref 0.1–1.0)
Monocytes Relative: 13 %
Neutro Abs: 1.9 10*3/uL (ref 1.7–7.7)
Neutrophils Relative %: 39 %
Platelets: 224 10*3/uL (ref 150–400)
RBC: 4.52 MIL/uL (ref 4.22–5.81)
RDW: 13.9 % (ref 11.5–15.5)
WBC: 5 10*3/uL (ref 4.0–10.5)
nRBC: 0 % (ref 0.0–0.2)

## 2023-10-30 NOTE — Progress Notes (Signed)
 Corey Murphy; [id number]; 1950-06-27   HPI Patient is a 73 year old black male who was referred to my care by Dr. Bluford and [facility] for evaluation and treatment of a right posterior thigh mass.  Patient states has been present for a while but he recently seems to have noticed it more.  It is nontender.  He does not state whether there is been a recent growth.  It is nontender. Past Medical History:  Diagnosis Date   Alcohol dependency (HCC)    Chronic kidney disease    Colonic adenoma 2006   Essential hypertension    History of cardiac catheterization    03/2013 LM nl, LAD nl, D1/2/3 small - nl, LCX nondom - nl, OM1/2/3 nl, RCA dom   Hypercholesteremia    IgG lambda monoclonal gammopathy 06/24/2015   Nonischemic cardiomyopathy (HCC)    LVEF approximately 40% 06/2013 - improved to normal range   Obesity    Pulmonary embolism (HCC)    Type 2 diabetes mellitus (HCC)     Past Surgical History:  Procedure Laterality Date   CIRCUMCISION     COLONOSCOPY  05/20/2004   RMR: Internal hemorrhoids.  Diminutive adenomatous polyp in the mid-right colon, otherwise normal   COLONOSCOPY N/A 06/19/2012   Procedure: COLONOSCOPY;  Surgeon: Lamar CHRISTELLA Hollingshead, MD;  Location: [facility];  Service: Endoscopy;  Laterality: N/A;  8:30AM-rescheduled 10:30am Anette Caldron notified pt   COLONOSCOPY N/A 08/08/2017   Surgeon: Hollingshead Lamar CHRISTELLA, MD;  diverticulosis in sigmoid, descending, and transverse colon, three 4-7 mm polyps in ascending colon, and one 9 mm polyp in ascending colon. Pathology with tubular adenomas. Repeat in 3 years.   COLONOSCOPY N/A 04/23/2019   Sigmoid and descending colon diverticulosis, one 5 mm polyp (tubular adenoma) in descending colon, non-bleeding internal hemorrhoids. 5 year surveillance.    Ileocolonoscopy  06/28/2007   MFM:Wnmfjo rectum/Submucosal sigmoid diverticula (chronic), doubt clinical significance Hyperplastic polypoid-appearing fold, mid ascending colon, status/Remainder colonic  mucosa and terminal ileum mucosa appeared normal    LEFT AND RIGHT HEART CATHETERIZATION WITH CORONARY ANGIOGRAM  03/19/2013   Procedure: LEFT AND RIGHT HEART CATHETERIZATION WITH CORONARY ANGIOGRAM;  Surgeon: Deatrice DELENA Cage, MD;  Location: [facility] CATH LAB;  Service: Cardiovascular;;   POLYPECTOMY  08/08/2017   Procedure: POLYPECTOMY;  Surgeon: Hollingshead Lamar CHRISTELLA, MD;  Location: [facility];  Service: Endoscopy;;  ascending colon polyps x4 (cold snare-3,hot snare)   TOTAL HIP ARTHROPLASTY  2010   Left   TOTAL HIP ARTHROPLASTY  2010   Right    Family History  Problem Relation Age of Onset   Heart failure Mother    Diabetes Mother    Hypertension Mother    Heart failure Sister    Hypertension Sister    Cancer Sister    Colon cancer Neg Hx     Current Outpatient Medications on File Prior to Visit  Medication Sig Dispense Refill   carvedilol  (COREG ) 3.125 MG tablet Take 1 tablet (3.125 mg total) by mouth 2 (two) times daily. 180 tablet 3   empagliflozin (JARDIANCE) 10 MG TABS tablet Take 1 tablet (10 mg total) by mouth daily before breakfast. 90 tablet 1   furosemide  (LASIX ) 20 MG tablet TAKE 1 TABLET (20 MG TOTAL) AS NEEDED (FOR WEIGHT GAIN OF 2-3 LBS OVERNIGHT OR 5 LBS IN ONE WEEK). 90 tablet 1   glimepiride  (AMARYL ) 4 MG tablet Take 4 mg by mouth 2 (two) times daily.     HYDROcodone -acetaminophen  (NORCO) 10-325 MG tablet Take 1 tablet  by mouth every 6 (six) hours.     Multiple Vitamins-Iron (MULTIVITAMIN/IRON PO) Take 1 tablet by mouth daily.      ONETOUCH ULTRA test strip 1 each 2 (two) times daily.     simvastatin  (ZOCOR ) 40 MG tablet Take 1 tablet (40 mg total) by mouth at bedtime. 90 tablet 3   spironolactone  (ALDACTONE ) 25 MG tablet TAKE 1 TABLET (25 MG TOTAL) BY MOUTH DAILY. 90 tablet 3   traZODone  (DESYREL ) 50 MG tablet Take 75 mg by mouth at bedtime.     VISINE 0.05 % ophthalmic solution SMARTSIG:1 Drop(s) In Eye(s) PRN     warfarin (COUMADIN) 1 MG tablet Take 1 mg by mouth  daily. 1 mg 5 days weekly     warfarin (COUMADIN) 5 MG tablet Take 5 mg by mouth daily.     zolpidem  (AMBIEN ) 10 MG tablet Take 5 mg by mouth at bedtime.     No current facility-administered medications on file prior to visit.    Allergies  Allergen Reactions   Peanut-Containing Drug Products Rash    *Cashews    Social History   Substance and Sexual Activity  Alcohol Use Yes   Comment: Occasionally 2 to 3 times a month    Social History   Tobacco Use  Smoking Status Former   Current packs/day: 0.00   Average packs/day: 1 pack/day for 12.0 years (12.0 ttl pk-yrs)   Types: Cigarettes   Start date: 05/28/1996   Quit date: 05/28/2008   Years since quitting: 15.4  Smokeless Tobacco Never    Review of Systems  Constitutional: Negative.   HENT: Negative.    Eyes: Negative.   Respiratory: Negative.    Cardiovascular: Negative.   Gastrointestinal: Negative.   Genitourinary: Negative.   Musculoskeletal: Negative.   Skin: Negative.   Neurological: Negative.   Endo/Heme/Allergies:  Bruises/bleeds easily.  Psychiatric/Behavioral: Negative.      Objective   Vitals:   10/30/23 1301  BP: (!) 145/81  Pulse: 71  Resp: 14  Temp: 98.4 F (36.9 C)  SpO2: 97%    Physical Exam Vitals reviewed.  Constitutional:      Appearance: Normal appearance. He is normal weight. He is not ill-appearing.  HENT:     Head: Normocephalic and atraumatic.   Cardiovascular:     Rate and Rhythm: Normal rate and regular rhythm.     Heart sounds: Normal heart sounds. No murmur heard.    No friction rub. No gallop.  Pulmonary:     Effort: Pulmonary effort is normal. No respiratory distress.     Breath sounds: Normal breath sounds. No stridor. No wheezing, rhonchi or rales.   Musculoskeletal:     Comments: A 5 x 7 cm ovoid rubbery but firm subcutaneous mass along the mid posterior thigh that is minimally mobile.   Skin:    General: Skin is warm and dry.   Neurological:     Mental  Status: He is alert and oriented to person, place, and time.     Assessment  Right posterior thigh mass.  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  It does seem to be somewhat fixed deep. Plan  Will get MRI of right thigh at [facility].  Further management is pending those results.  I will see the patient back after the MRI is done.

## 2023-10-31 LAB — KAPPA/LAMBDA LIGHT CHAINS
Kappa free light chain: 22.4 mg/L — ABNORMAL HIGH (ref 3.3–19.4)
Kappa, lambda light chain ratio: 0.26 (ref 0.26–1.65)
Lambda free light chains: 87.1 mg/L — ABNORMAL HIGH (ref 5.7–26.3)

## 2023-11-01 ENCOUNTER — Encounter: Payer: Self-pay | Admitting: Family Medicine

## 2023-11-01 LAB — PROTEIN ELECTROPHORESIS, SERUM
A/G Ratio: 0.9 (ref 0.7–1.7)
Albumin ELP: 3.6 g/dL (ref 2.9–4.4)
Alpha-1-Globulin: 0.2 g/dL (ref 0.0–0.4)
Alpha-2-Globulin: 0.7 g/dL (ref 0.4–1.0)
Beta Globulin: 1.1 g/dL (ref 0.7–1.3)
Gamma Globulin: 1.9 g/dL — ABNORMAL HIGH (ref 0.4–1.8)
Globulin, Total: 3.9 g/dL (ref 2.2–3.9)
M-Spike, %: 1.5 g/dL — ABNORMAL HIGH
Total Protein ELP: 7.5 g/dL (ref 6.0–8.5)

## 2023-11-07 ENCOUNTER — Inpatient Hospital Stay: Payer: Medicare HMO

## 2023-11-09 ENCOUNTER — Ambulatory Visit (HOSPITAL_COMMUNITY)
Admission: RE | Admit: 2023-11-09 | Discharge: 2023-11-09 | Disposition: A | Discharge status: Home / Self Care | Source: Ambulatory Visit | Attending: General Surgery | Admitting: General Surgery

## 2023-11-09 DIAGNOSIS — R2241 Localized swelling, mass and lump, right lower limb: Secondary | ICD-10-CM | POA: Diagnosis not present

## 2023-11-09 MED ORDER — GADOBUTROL 1 MMOL/ML IV SOLN
7.5000 mL | Freq: Once | INTRAVENOUS | Status: AC | PRN
  Administered 2023-11-09: 7.5 mL via INTRAVENOUS

## 2023-11-13 DIAGNOSIS — I2699 Other pulmonary embolism without acute cor pulmonale: Secondary | ICD-10-CM | POA: Diagnosis not present

## 2023-11-13 NOTE — Progress Notes (Signed)
 [facility] [address], [place] [address]    Clinic Day:  11/14/2023  Referring physician: Bertell Satterfield, MD  Patient Care Team: Cook, Jayce G, DO as PCP - General (Family Medicine) [name], MD as PCP - Cardiology (Cardiology) [name], MD as Attending Physician (Gastroenterology) Pcp, No   ASSESSMENT & PLAN:   Assessment: 1.  IgG lambda smoldering multiple myeloma: -BM BX on 07/08/2015 shows normocellular marrow with monoclonal plasmacytosis, 10-20%, 46, XY, myeloma FISH panel negative -Skeletal survey on 06/04/2019 was negative for lytic lesions.   2.  Unprovoked pulmonary embolism: - He had submassive PE on 11/25/2022.  He is on Coumadin which she will continue indefinitely.     Plan: 1.  IgG lambda smoldering multiple myeloma: - He does not report any new onset infections.  No new bone pains reported.  Back pain is stable. - Labs from 10/30/2023: Creatinine 1.33, calcium 8.7.  Hemoglobin is normal at 13.9.  M spike is stable at 1.5.  Lambda light chains are slowly trending up with latest level at 87 but ratio is normal at 0.26. - Skeletal survey from 10/30/2023: Negative for lytic lesions. - No crab features at this time to warrant therapy.  Will continue monitoring in 6 months with repeat myeloma labs.  Will repeat skeletal survey in 1 year. - He is found to have right posterior thigh mass and is planning on undergoing resection as differential includes peripheral nerve sheath tumor or sarcoma.   2.  CKD: - Baseline creatinine between 1.3-1.5 is stable.  Latest creatinine is 1.33.    Orders Placed This Encounter  Procedures   CBC with Differential    Standing Status:   Future    Expected Date:   05/12/2024    Expiration Date:   08/10/2024   Comprehensive metabolic panel    Standing Status:   Future    Expected Date:   05/12/2024    Expiration Date:   08/10/2024   Kappa/lambda light chains    Standing Status:   Future     Expected Date:   05/12/2024    Expiration Date:   08/10/2024   Protein electrophoresis, serum    Standing Status:   Future    Expected Date:   05/12/2024    Expiration Date:   08/10/2024      LILLETTE Hummingbird R Teague,acting as a scribe for [name], MD.,have documented all relevant documentation on the behalf of [name], MD,as directed by  [name], MD while in the presence of [name], MD.  I, [name] MD, have reviewed the above documentation for accuracy and completeness, and I agree with the above.     [name], MD   7/16/20253:41 PM  CHIEF COMPLAINT:   Diagnosis: smoldering IgG lambda multiple myeloma    Cancer Staging  No matching staging information was found for the patient.    Prior Therapy: none  Current Therapy:  surveillance    HISTORY OF PRESENT ILLNESS:   Oncology History   No history exists.     INTERVAL HISTORY:   Corey Murphy is a 73 y.o. male presenting to clinic today for follow up of smoldering IgG lambda multiple myeloma. He was last seen by me on 05/14/2023.  Since his last visit, he underwent a bone survey on 10/30/23 that found:  No lytic bone lesions. Bilateral hip replacements.  Degenerative changes in the spine.  Fue also had MRI of the right femur on 11/09/23 for  a right thigh mass which showed: Enhancing mass in the posterior lateral right thigh superficial to the long head of the biceps femoris muscle as above. Etiology is uncertain. This could be a peripheral nerve sheath tumor or a sarcomatous lesion. Excisional biopsy recommended. There is mass effect on the biceps muscle without obvious invasion of the deep muscles at the current time.  Today, he states that he is doing well overall. His appetite level is at 100%. His energy level is at 100%.  PAST MEDICAL HISTORY:   Past Medical History: Past Medical History:  Diagnosis Date   Alcohol dependency (HCC)    Chronic kidney disease     Colonic adenoma 2006   Essential hypertension    History of cardiac catheterization    03/2013 LM nl, LAD nl, D1/2/3 small - nl, LCX nondom - nl, OM1/2/3 nl, RCA dom   Hypercholesteremia    IgG lambda monoclonal gammopathy 06/24/2015   Nonischemic cardiomyopathy (HCC)    LVEF approximately 40% 06/2013 - improved to normal range   Obesity    Pulmonary embolism (HCC)    Type 2 diabetes mellitus (HCC)     Surgical History: Past Surgical History:  Procedure Laterality Date   CIRCUMCISION     COLONOSCOPY  05/20/2004   RMR: Internal hemorrhoids.  Diminutive adenomatous polyp in the mid-right colon, otherwise normal   COLONOSCOPY N/A 06/19/2012   Procedure: COLONOSCOPY;  Surgeon: Lamar CHRISTELLA Hollingshead, MD;  Location: [facility];  Service: Endoscopy;  Laterality: N/A;  8:30AM-rescheduled 10:30am Anette Caldron notified pt   COLONOSCOPY N/A 08/08/2017   Surgeon: Hollingshead Lamar CHRISTELLA, MD;  diverticulosis in sigmoid, descending, and transverse colon, three 4-7 mm polyps in ascending colon, and one 9 mm polyp in ascending colon. Pathology with tubular adenomas. Repeat in 3 years.   COLONOSCOPY N/A 04/23/2019   Sigmoid and descending colon diverticulosis, one 5 mm polyp (tubular adenoma) in descending colon, non-bleeding internal hemorrhoids. 5 year surveillance.    Ileocolonoscopy  06/28/2007   MFM:Wnmfjo rectum/Submucosal sigmoid diverticula (chronic), doubt clinical significance Hyperplastic polypoid-appearing fold, mid ascending colon, status/Remainder colonic mucosa and terminal ileum mucosa appeared normal    LEFT AND RIGHT HEART CATHETERIZATION WITH CORONARY ANGIOGRAM  03/19/2013   Procedure: LEFT AND RIGHT HEART CATHETERIZATION WITH CORONARY ANGIOGRAM;  Surgeon: Deatrice DELENA Cage, MD;  Location: [facility];  Service: Cardiovascular;;   POLYPECTOMY  08/08/2017   Procedure: POLYPECTOMY;  Surgeon: Hollingshead Lamar CHRISTELLA, MD;  Location: [facility];  Service: Endoscopy;;  ascending colon polyps x4 (cold snare-3,hot  snare)   TOTAL HIP ARTHROPLASTY  2010   Left   TOTAL HIP ARTHROPLASTY  2010   Right    Social History: Social History   Socioeconomic History   Marital status: Married    Spouse name: Not on file   Number of children: 2   Years of education: Not on file   Highest education level: Master's degree (e.g., MA, MS, MEng, MEd, MSW, MBA)  Occupational History   Occupation: Retired, Set designer  Tobacco Use   Smoking status: Former    Current packs/day: 0.00    Average packs/day: 1 pack/day for 12.0 years (12.0 ttl pk-yrs)    Types: Cigarettes    Start date: 05/28/1996    Quit date: 05/28/2008    Years since quitting: 15.4   Smokeless tobacco: Never  Vaping Use   Vaping status: Never Used  Substance and Sexual Activity   Alcohol use: Yes    Comment: Occasionally 2 to 3 times  a month   Drug use: Not Currently    Types: Marijuana    Comment: Smoked via a bowl occassionally.    Sexual activity: Not on file  Other Topics Concern   Not on file  Social History Narrative   Not on file   Social Drivers of Health   Financial Resource Strain: Low Risk  (10/24/2023)   Overall Financial Resource Strain (CARDIA)    Difficulty of Paying Living Expenses: Not hard at all  Food Insecurity: No Food Insecurity (10/24/2023)   Hunger Vital Sign    Worried About Running Out of Food in the Last Year: Never true    Ran Out of Food in the Last Year: Never true  Transportation Needs: No Transportation Needs (10/24/2023)   PRAPARE - Administrator, Civil Service (Medical): No    Lack of Transportation (Non-Medical): No  Physical Activity: Sufficiently Active (10/24/2023)   Exercise Vital Sign    Days of Exercise per Week: 6 days    Minutes of Exercise per Session: 90 min  Stress: No Stress Concern Present (10/24/2023)   [employer] of Occupational Health - Occupational Stress Questionnaire    Feeling of Stress: Not at all  Social Connections: Moderately Integrated (10/24/2023)    Social Connection and Isolation Panel    Frequency of Communication with Friends and Family: Once a week    Frequency of Social Gatherings with Friends and Family: Once a week    Attends Religious Services: 1 to 4 times per year    Active Member of [employer] or Organizations: Yes    Attends Engineer, structural: More than 4 times per year    Marital Status: Married  Catering manager Violence: Not At Risk (11/26/2022)   Humiliation, Afraid, Rape, and Kick questionnaire    Fear of Current or Ex-Partner: No    Emotionally Abused: No    Physically Abused: No    Sexually Abused: No    Family History: Family History  Problem Relation Age of Onset   Heart failure Mother    Diabetes Mother    Hypertension Mother    Heart failure Sister    Hypertension Sister    Cancer Sister    Colon cancer Neg Hx     Current Medications:  Current Outpatient Medications:    carvedilol  (COREG ) 3.125 MG tablet, Take 1 tablet (3.125 mg total) by mouth 2 (two) times daily., Disp: 180 tablet, Rfl: 3   furosemide  (LASIX ) 20 MG tablet, TAKE 1 TABLET (20 MG TOTAL) AS NEEDED (FOR WEIGHT GAIN OF 2-3 LBS OVERNIGHT OR 5 LBS IN ONE WEEK)., Disp: 90 tablet, Rfl: 1   glimepiride  (AMARYL ) 4 MG tablet, Take 4 mg by mouth 2 (two) times daily., Disp: , Rfl:    HYDROcodone -acetaminophen  (NORCO) 10-325 MG tablet, Take 1 tablet by mouth every 6 (six) hours., Disp: , Rfl:    Multiple Vitamins-Iron (MULTIVITAMIN/IRON PO), Take 1 tablet by mouth daily. , Disp: , Rfl:    ONETOUCH ULTRA test strip, 1 each 2 (two) times daily., Disp: , Rfl:    simvastatin  (ZOCOR ) 40 MG tablet, Take 1 tablet (40 mg total) by mouth at bedtime., Disp: 90 tablet, Rfl: 3   spironolactone  (ALDACTONE ) 25 MG tablet, TAKE 1 TABLET (25 MG TOTAL) BY MOUTH DAILY., Disp: 90 tablet, Rfl: 3   traZODone  (DESYREL ) 50 MG tablet, Take 75 mg by mouth at bedtime., Disp: , Rfl:    VISINE 0.05 % ophthalmic solution, SMARTSIG:1 Drop(s) In Eye(s) PRN, Disp: ,  Rfl:     warfarin (COUMADIN) 1 MG tablet, Take 1 mg by mouth daily. 1 mg 5 days weekly, Disp: , Rfl:    warfarin (COUMADIN) 5 MG tablet, Take 5 mg by mouth daily., Disp: , Rfl:    zolpidem  (AMBIEN ) 10 MG tablet, Take 5 mg by mouth at bedtime., Disp: , Rfl:    empagliflozin  (JARDIANCE ) 10 MG TABS tablet, Take 1 tablet (10 mg total) by mouth daily before breakfast., Disp: 90 tablet, Rfl: 1   Allergies: Allergies  Allergen Reactions   Peanut-Containing Drug Products Rash    *Cashews    REVIEW OF SYSTEMS:   Review of Systems  Constitutional:  Negative for chills, fatigue and fever.  HENT:   Negative for lump/mass, mouth sores, nosebleeds, sore throat and trouble swallowing.   Eyes:  Negative for eye problems.  Respiratory:  Negative for cough and shortness of breath.   Cardiovascular:  Negative for chest pain, leg swelling and palpitations.  Gastrointestinal:  Negative for abdominal pain, constipation, diarrhea, nausea and vomiting.  Genitourinary:  Negative for bladder incontinence, difficulty urinating, dysuria, frequency, hematuria and nocturia.   Musculoskeletal:  Negative for arthralgias, back pain, flank pain, myalgias and neck pain.  Skin:  Negative for itching and rash.  Neurological:  Negative for dizziness, headaches and numbness.  Hematological:  Does not bruise/bleed easily.  Psychiatric/Behavioral:  Negative for depression, sleep disturbance and suicidal ideas. The patient is not nervous/anxious.   All other systems reviewed and are negative.    VITALS:   Blood pressure 131/88, pulse 73, temperature 98.4 F (36.9 C), temperature source Oral, resp. rate 16, weight 175 lb 0.7 oz (79.4 kg), SpO2 99%.  Wt Readings from Last 3 Encounters:  11/14/23 175 lb 0.7 oz (79.4 kg)  10/30/23 176 lb (79.8 kg)  10/25/23 173 lb 12.8 oz (78.8 kg)    Body mass index is 26.62 kg/m.  Performance status (ECOG): 1 - Symptomatic but completely ambulatory  PHYSICAL EXAM:   Physical Exam Vitals  and nursing note reviewed. Exam conducted with a chaperone present.  Constitutional:      Appearance: Normal appearance.  Cardiovascular:     Rate and Rhythm: Normal rate and regular rhythm.     Pulses: Normal pulses.     Heart sounds: Normal heart sounds.  Pulmonary:     Effort: Pulmonary effort is normal.     Breath sounds: Normal breath sounds.  Abdominal:     Palpations: Abdomen is soft. There is no hepatomegaly, splenomegaly or mass.     Tenderness: There is no abdominal tenderness.  Musculoskeletal:     Right lower leg: No edema.     Left lower leg: No edema.  Lymphadenopathy:     Cervical: No cervical adenopathy.     Right cervical: No superficial, deep or posterior cervical adenopathy.    Left cervical: No superficial, deep or posterior cervical adenopathy.     Upper Body:     Right upper body: No supraclavicular or axillary adenopathy.     Left upper body: No supraclavicular or axillary adenopathy.  Neurological:     General: No focal deficit present.     Mental Status: He is alert and oriented to person, place, and time.  Psychiatric:        Mood and Affect: Mood normal.        Behavior: Behavior normal.     LABS:      Latest Ref Rng & Units 10/30/2023    8:36 AM 05/07/2023  12:01 PM 03/22/2023    2:49 AM  CBC  WBC 4.0 - 10.5 K/uL 5.0  5.1  5.6   Hemoglobin 13.0 - 17.0 g/dL 86.0  85.8  86.7   Hematocrit 39.0 - 52.0 % 42.3  44.0  38.6   Platelets 150 - 400 K/uL 224  230  246       Latest Ref Rng & Units 10/30/2023    8:36 AM 10/25/2023    3:45 PM 05/07/2023   12:01 PM  CMP  Glucose 70 - 99 mg/dL 879  890  99   BUN 8 - 23 mg/dL 16  19  22    Creatinine 0.61 - 1.24 mg/dL 8.66  8.59  8.52   Sodium 135 - 145 mmol/L 136  137  135   Potassium 3.5 - 5.1 mmol/L 3.6  3.8  3.4   Chloride 98 - 111 mmol/L 102  98  104   CO2 22 - 32 mmol/L 26  25  25    Calcium 8.9 - 10.3 mg/dL 8.7  9.5  8.8   Total Protein 6.5 - 8.1 g/dL 8.0  8.3  8.1   Total Bilirubin 0.0 - 1.2 mg/dL  0.6  0.4  0.4   Alkaline Phos 38 - 126 U/L 74  98  72   AST 15 - 41 U/L 20  19  20    ALT 0 - 44 U/L 28  22  28       No results found for: CEA1, CEA / No results found for: CEA1, CEA No results found for: PSA1 No results found for: CAN199 No results found for: RJW874  Lab Results  Component Value Date   TOTALPROTELP 7.5 10/30/2023   ALBUMINELP 3.6 10/30/2023   A1GS 0.2 10/30/2023   A2GS 0.7 10/30/2023   BETS 1.1 10/30/2023   GAMS 1.9 (H) 10/30/2023   MSPIKE 1.5 (H) 10/30/2023   SPEI Comment 10/30/2023   Lab Results  Component Value Date   TIBC 381 07/05/2015   FERRITIN 79 07/05/2015   IRONPCTSAT 24 07/05/2015   Lab Results  Component Value Date   LDH 89 (L) 10/19/2022   LDH 86 (L) 04/07/2020   LDH 88 (L) 12/05/2019     STUDIES:   MR FEMUR RIGHT W WO CONTRAST Result Date: 11/09/2023 MR FEMUR WITHOUT THEN WITH IV CONTRAST COMPARISON: None. CLINICAL HISTORY: Posterior lateral thigh mass. PULSE SEQUENCES: Ax T1, Ax T2 FS, Sag T1, Sag T2 FS, Cor STIR, Cor T1 with and without 7.5 mL of gadolinium based IV contrast. FINDINGS: Bones: Bones are unremarkable. Is no fracture or marrow placing process. Right hip arthroplasty is present. No periarticular fluid collection abnormality is appreciated. Musculotendinous structures: There is no myositis or tendinosis identified. There is a mass in the posterior right thigh superficial to the fascia of the long head of the biceps femoris muscle measuring 2.6 x 5.5 x 6.3 cm in size. There is significant enhancement following contrast administration. There is no evidence of invasion. There is no involvement of the neurovascular structures. Etiology is uncertain. Excisional biopsy suggested for definitive evaluation. IMPRESSION: Enhancing mass in the posterior lateral right thigh superficial to the long head of the biceps femoris muscle as above. Etiology is uncertain. This could be a peripheral nerve sheath tumor or a sarcomatous  lesion. Excisional biopsy recommended. There is mass effect on the biceps muscle without obvious invasion of the deep muscles at the current time. Electronically signed by: Norleen Satchel MD 11/09/2023 12:52 PM EDT RP Workstation: [id number]  DG Bone Survey Met Result Date: 11/03/2023 CLINICAL DATA:  Smoldering myeloma. EXAM: METASTATIC BONE SURVEY COMPARISON:  10/19/2022 FINDINGS: No suspicious osseous lesions. Bilateral hip replacements. Degenerative changes in lower cervical spine with disc space narrowing at C6-C7. Mild retrolisthesis at L2-L3. Disc space narrowing at L3-L4. Both lungs are clear. Heart size is normal. IMPRESSION: Negative metastatic bone survey.  No lytic bone lesions. Bilateral hip replacements.  Degenerative changes in the spine. Electronically Signed   By: Juliene Balder M.D.   On: 11/03/2023 16:01

## 2023-11-14 ENCOUNTER — Inpatient Hospital Stay: Payer: Medicare HMO | Admitting: Hematology

## 2023-11-14 VITALS — BP 131/88 | HR 73 | Temp 98.4°F | Resp 16 | Wt 175.0 lb

## 2023-11-14 DIAGNOSIS — Z87891 Personal history of nicotine dependence: Secondary | ICD-10-CM | POA: Diagnosis not present

## 2023-11-14 DIAGNOSIS — D472 Monoclonal gammopathy: Secondary | ICD-10-CM | POA: Diagnosis not present

## 2023-11-14 DIAGNOSIS — Z7901 Long term (current) use of anticoagulants: Secondary | ICD-10-CM | POA: Diagnosis not present

## 2023-11-14 DIAGNOSIS — N189 Chronic kidney disease, unspecified: Secondary | ICD-10-CM | POA: Diagnosis not present

## 2023-11-14 DIAGNOSIS — Z86711 Personal history of pulmonary embolism: Secondary | ICD-10-CM | POA: Diagnosis not present

## 2023-11-14 NOTE — Patient Instructions (Addendum)
 [facility] at [facility] Discharge Instructions   You were seen and examined today by Dr. Rogers.  He reviewed the results of your lab work which are normal/stable.   He reviewed the results of your skeletal survey which did not show any lytic bone lesions.   We will see you back in 6 months. We will repeat lab work prior to this visit.    Return as scheduled.    Thank you for choosing [facility] at [facility] to provide your oncology and hematology care.  To afford each patient quality time with our provider, please arrive at least 15 minutes before your scheduled appointment time.   If you have a lab appointment with the Cancer Center please come in thru the Main Entrance and check in at the main information desk.  You need to re-schedule your appointment should you arrive 10 or more minutes late.  We strive to give you quality time with our providers, and arriving late affects you and other patients whose appointments are after yours.  Also, if you no show three or more times for appointments you may be dismissed from the clinic at the providers discretion.     Again, thank you for choosing [facility].  Our hope is that these requests will decrease the amount of time that you wait before being seen by our physicians.       _____________________________________________________________  Should you have questions after your visit to [facility], please contact our office at [phone] and follow the prompts.  Our office hours are 8:00 a.m. and 4:30 p.m. Monday - Friday.  Please note that voicemails left after 4:00 p.m. may not be returned until the following business day.  We are closed weekends and major holidays.  You do have access to a nurse 24-7, just call the main number to the clinic [phone] and do not press any options, hold on the line and a nurse will answer the phone.    For prescription refill  requests, have your pharmacy contact our office and allow 72 hours.    Due to Covid, you will need to wear a mask upon entering the hospital. If you do not have a mask, a mask will be given to you at the Main Entrance upon arrival. For doctor visits, patients may have 1 support person age 68 or older with them. For treatment visits, patients can not have anyone with them due to social distancing guidelines and our immunocompromised population.

## 2023-11-15 ENCOUNTER — Ambulatory Visit: Admitting: General Surgery

## 2023-11-15 ENCOUNTER — Encounter: Payer: Self-pay | Admitting: General Surgery

## 2023-11-15 VITALS — BP 149/90 | HR 65 | Temp 98.2°F | Resp 12 | Ht 68.0 in | Wt 177.0 lb

## 2023-11-15 DIAGNOSIS — R2241 Localized swelling, mass and lump, right lower limb: Secondary | ICD-10-CM | POA: Diagnosis not present

## 2023-11-15 NOTE — Progress Notes (Signed)
 Patient here to review the results of his MRI of the right thigh.   MR FEMUR RIGHT W WO CONTRAST (Accession [id number]) (Order [id number]) Imaging Date: 11/09/2023 Department: [facility] Released By: Marcine Chrissie DEL Authorizing: Mavis Anes, MD   Exam Status  Status  Final [99]   PACS Intelerad Image Link   Show images for MR FEMUR RIGHT W WO CONTRAST Study Result  Narrative & Impression  MR FEMUR WITHOUT THEN WITH IV CONTRAST   COMPARISON: None.   CLINICAL HISTORY: Posterior lateral thigh mass.   PULSE SEQUENCES: Ax T1, Ax T2 FS, Sag T1, Sag T2 FS, Cor STIR, Cor T1 with and without 7.5 mL of gadolinium based IV contrast.   FINDINGS:   Bones: Bones are unremarkable. Is no fracture or marrow placing process. Right hip arthroplasty is present. No periarticular fluid collection abnormality is appreciated.   Musculotendinous structures: There is no myositis or tendinosis identified. There is a mass in the posterior right thigh superficial to the fascia of the long head of the biceps femoris muscle measuring 2.6 x 5.5 x 6.3 cm in size. There is significant enhancement following contrast administration. There is no evidence of invasion. There is no involvement of the neurovascular structures. Etiology is uncertain. Excisional biopsy suggested for definitive evaluation.   IMPRESSION: Enhancing mass in the posterior lateral right thigh superficial to the long head of the biceps femoris muscle as above. Etiology is uncertain. This could be a peripheral nerve sheath tumor or a sarcomatous lesion. Excisional biopsy recommended. There is mass effect on the biceps muscle without obvious invasion of the deep muscles at the current time.   Electronically signed by: Norleen Satchel MD 11/09/2023 12:52 PM EDT RP Workstation: [id number]      Scans on Order [id number]  Scan on 11/09/2023 12:20 PM by Marvene Leontine CROME, RT     I have discussed the results with the patient.  I  have referred the patient to Dr. Margrette of orthopedic surgery for further evaluation and treatment.  Follow-up here as needed.

## 2023-11-21 ENCOUNTER — Ambulatory Visit: Admitting: Orthopedic Surgery

## 2023-11-21 VITALS — BP 151/83 | HR 64 | Ht 68.0 in | Wt 174.0 lb

## 2023-11-21 DIAGNOSIS — R2241 Localized swelling, mass and lump, right lower limb: Secondary | ICD-10-CM | POA: Diagnosis not present

## 2023-11-21 DIAGNOSIS — D1723 Benign lipomatous neoplasm of skin and subcutaneous tissue of right leg: Secondary | ICD-10-CM

## 2023-11-21 NOTE — Progress Notes (Signed)
  Intake history:  BP (!) 151/83   Pulse 64   Ht 5' 8 (1.727 m)   Wt 174 lb (78.9 kg)   BMI 26.46 kg/m  Body mass index is 26.46 kg/m.    WHAT ARE WE SEEING YOU FOR TODAY?   right upper leg(s)  How Corey Murphy has this bothered you? (DOI?DOS?WS?)  4-6 month(s) ago  Anticoag.  Yes  Diabetes Yes  Heart disease Yes  Hypertension No  SMOKING HX No  Kidney disease Yes  Any ALLERGIES ______________________________________________   Treatment:  Have you taken:  Tylenol  No  Advil No  Had PT No  Had injection No  Other  _________________________

## 2023-11-21 NOTE — Progress Notes (Signed)
 Patient ID: Corey Murphy, male   DOB: 03-21-1951, 73 y.o.   MRN: [id number]  ASSESSMENT AND PLAN : Encounter Diagnoses  Name Primary?   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  Patient education was provided  in the presence of someone who was with him.  Basically I told him that a musculoskeletal tumor specialist needs to remove this mass.  It does have some characteristics of malignancy such as size greater than 5 cm and recent increase in dimensions.  However, he did have a normal skeletal survey and he is not having any numbness or tingling to suggest neurovascular involvement    Chief Complaint  Patient presents with   Leg Pain    Large knot on Mass Right thigh area not warm to touch    HPI Corey Murphy is a 73 y.o. male.  Who presents for evaluation of mass right thigh posterior aspect does not cause pain noticed it after he developed a pulmonary embolus and lost 30 pounds.  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  Diminutive adenomatous polyp in the mid-right colon, otherwise normal   COLONOSCOPY N/A 06/19/2012   Procedure: COLONOSCOPY;  Surgeon: Lamar CHRISTELLA Hollingshead, MD;  [facility]: [facility];  Service: Endoscopy;  Laterality: N/A;  8:30AM-rescheduled 10:30am Anette Caldron notified pt   COLONOSCOPY N/A 08/08/2017   Surgeon: Hollingshead Lamar CHRISTELLA, MD;  diverticulosis in sigmoid, descending, and transverse colon, three 4-7 mm polyps in ascending colon, and one 9 mm polyp in ascending colon. Pathology with tubular adenomas. Repeat in 3 years.   COLONOSCOPY N/A 04/23/2019   Sigmoid and descending colon diverticulosis, one 5 mm polyp (tubular adenoma) in descending colon, non-bleeding internal hemorrhoids. 5 year surveillance.    Ileocolonoscopy  06/28/2007   MFM:Wnmfjo rectum/Submucosal sigmoid diverticula (chronic), doubt clinical significance Hyperplastic polypoid-appearing fold, mid ascending colon, status/Remainder colonic mucosa and terminal ileum mucosa appeared normal    LEFT AND RIGHT HEART CATHETERIZATION WITH CORONARY ANGIOGRAM  03/19/2013   Procedure: LEFT AND RIGHT HEART CATHETERIZATION WITH CORONARY ANGIOGRAM;  Surgeon: Deatrice DELENA Cage, MD;  Location: [facility];  Service: Cardiovascular;;   POLYPECTOMY  08/08/2017   Procedure: POLYPECTOMY;  Surgeon: Hollingshead Lamar CHRISTELLA, MD;  Location: [facility];  Service: Endoscopy;;  ascending colon polyps x4 (cold snare-3,hot snare)   TOTAL HIP ARTHROPLASTY  2010   Left  TOTAL HIP ARTHROPLASTY  2010   Right    Family History  Problem Relation Age of Onset   Heart failure Mother    Diabetes Mother    Hypertension Mother    Heart failure Sister    Hypertension Sister    Cancer Sister    Colon cancer Neg Hx     Social History Social History   Tobacco Use   Smoking status: Former    Current packs/day: 0.00    Average packs/day: 1 pack/day for 12.0 years (12.0 ttl pk-yrs)    Types: Cigarettes    Start date: 05/28/1996    Quit date:  05/28/2008    Years since quitting: 15.4   Smokeless tobacco: Never  Vaping Use   Vaping status: Never Used  Substance Use Topics   Alcohol use: Yes    Comment: Occasionally 2 to 3 times a month   Drug use: Not Currently    Types: Marijuana    Comment: Smoked via a bowl occassionally.     Allergies  Allergen Reactions   Peanut-Containing Drug Products Rash    *Cashews    Current Outpatient Medications  Medication Sig Dispense Refill   carvedilol  (COREG ) 3.125 MG tablet Take 1 tablet (3.125 mg total) by mouth 2 (two) times daily. 180 tablet 3   furosemide  (LASIX ) 20 MG tablet TAKE 1 TABLET (20 MG TOTAL) AS NEEDED (FOR WEIGHT GAIN OF 2-3 LBS OVERNIGHT OR 5 LBS IN ONE WEEK). 90 tablet 1   glimepiride  (AMARYL ) 4 MG tablet Take 4 mg by mouth 2 (two) times daily.     HYDROcodone -acetaminophen  (NORCO) 10-325 MG tablet Take 1 tablet by mouth every 6 (six) hours.     Multiple Vitamins-Iron (MULTIVITAMIN/IRON PO) Take 1 tablet by mouth daily.      ONETOUCH ULTRA test strip 1 each 2 (two) times daily.     simvastatin  (ZOCOR ) 40 MG tablet Take 1 tablet (40 mg total) by mouth at bedtime. 90 tablet 3   spironolactone  (ALDACTONE ) 25 MG tablet TAKE 1 TABLET (25 MG TOTAL) BY MOUTH DAILY. 90 tablet 3   traZODone  (DESYREL ) 50 MG tablet Take 75 mg by mouth at bedtime.     VISINE 0.05 % ophthalmic solution SMARTSIG:1 Drop(s) In Eye(s) PRN     warfarin (COUMADIN) 1 MG tablet Take 1 mg by mouth daily. 1 mg 5 days weekly     warfarin (COUMADIN) 5 MG tablet Take 5 mg by mouth daily.     zolpidem  (AMBIEN ) 10 MG tablet Take 5 mg by mouth at bedtime.     No current facility-administered medications for this visit.       Physical Exam BP (!) 151/83   Pulse 64   Ht 5' 8 (1.727 m)   Wt 174 lb (78.9 kg)   BMI 26.46 kg/m   The patient is well developed well nourished and well groomed.  Orientation to person place and time is normal  Mood is pleasant.  Ambulatory status normal no  limp  Neurovascular exam intact 5 to 6 cm long by 4 cm wide soft tissue mass with no tenderness  MEDICAL DECISION MAKING  A.  Encounter Diagnoses  Name Primary?   Mass of right thigh    Lipoma of right lower extremity Yes    B. DATA ANALYSED:  Note from Dr. MARLA from the cancer center indicates smoldering myeloma negative skeletal survey  IMAGING: Independent interpretation of images: MRI I reviewed myself he has a large soft tissue mass subcutaneous seems  to be encapsulated pushing on the muscle tissue but not invasive  Orders: No new orders  Outside records reviewed: As stated  C. MANAGEMENT referral to the muscular tumor specialist  No orders of the defined types were placed in this encounter.

## 2023-11-29 DIAGNOSIS — N183 Chronic kidney disease, stage 3 unspecified: Secondary | ICD-10-CM | POA: Diagnosis not present

## 2023-11-29 DIAGNOSIS — I2510 Atherosclerotic heart disease of native coronary artery without angina pectoris: Secondary | ICD-10-CM | POA: Diagnosis not present

## 2023-12-06 ENCOUNTER — Telehealth: Payer: Self-pay

## 2023-12-06 DIAGNOSIS — M7989 Other specified soft tissue disorders: Secondary | ICD-10-CM | POA: Diagnosis not present

## 2023-12-06 DIAGNOSIS — R2241 Localized swelling, mass and lump, right lower limb: Secondary | ICD-10-CM | POA: Diagnosis not present

## 2023-12-06 DIAGNOSIS — D1723 Benign lipomatous neoplasm of skin and subcutaneous tissue of right leg: Secondary | ICD-10-CM | POA: Diagnosis not present

## 2023-12-06 NOTE — Telephone Encounter (Signed)
 Communication  Reason for CRM: Warren w/[facility] called.. he is having biopsy and is taking warfarin (COUMADIN) 1 MG tablet.. does it need to be bridged and if there is something else he needs to be given.. [phone]

## 2023-12-11 ENCOUNTER — Ambulatory Visit (INDEPENDENT_AMBULATORY_CARE_PROVIDER_SITE_OTHER): Admitting: Family Medicine

## 2023-12-11 ENCOUNTER — Encounter: Payer: Self-pay | Admitting: Family Medicine

## 2023-12-11 VITALS — BP 143/84 | HR 75 | Temp 98.4°F | Ht 68.0 in | Wt 176.0 lb

## 2023-12-11 DIAGNOSIS — I2699 Other pulmonary embolism without acute cor pulmonale: Secondary | ICD-10-CM | POA: Diagnosis not present

## 2023-12-11 DIAGNOSIS — Z01818 Encounter for other preprocedural examination: Secondary | ICD-10-CM | POA: Diagnosis not present

## 2023-12-11 NOTE — Assessment & Plan Note (Signed)
 Low risk via Revised Cardiac Risk Index. Discussed with Oncology. Will need bridging. Discussed with Pharmacist Recardo Griffin. Will plan to stop Warfarin 5 days prior, start Lovenox  3 days prior with last dose of Lovenox  ~ 24 hours prior to surgery.

## 2023-12-11 NOTE — Progress Notes (Signed)
 Subjective:  Patient ID: Corey Murphy, male    DOB: 10/15/1950  Age: 73 y.o. MRN: [id number]  CC:   Chief Complaint  Patient presents with   Pre-op Exam    Upcoming surgery on sept 12th for lump on thigh. Has to wean off coumadin prior to surgery     HPI:  73 year old male presents for the above.  Has a soft tissue mass (right leg/thigh) and surgery has been recommended. Is on lifelong Warfarin. He is here to discuss what will need to be done regarding his warfarin prior to surgery. Has had recent labs. No other complaints or concerns at this time.  Patient Active Problem List   Diagnosis Date Noted   Preoperative examination 12/11/2023   Heart failure with recovered ejection fraction (HFrecEF) (HCC) 10/25/2023   History of pulmonary embolism 10/25/2023   Type 2 diabetes mellitus with diabetic chronic kidney disease (HCC) 10/25/2023   Mixed hyperlipidemia 11/26/2022   Smoldering myeloma 08/03/2015   CKD (chronic kidney disease) stage 3, GFR 30-59 ml/min (HCC) 06/06/2013   Obesity 03/22/2013   Essential hypertension, benign     Social Hx   Social History   Socioeconomic History   Marital status: Married    Spouse name: Not on file   Number of children: 2   Years of education: Not on file   Highest education level: Master's degree (e.g., MA, MS, MEng, MEd, MSW, MBA)  Occupational History   Occupation: Retired, Set designer  Tobacco Use   Smoking status: Former    Current packs/day: 0.00    Average packs/day: 1 pack/day for 12.0 years (12.0 ttl pk-yrs)    Types: Cigarettes    Start date: 05/28/1996    Quit date: 05/28/2008    Years since quitting: 15.5   Smokeless tobacco: Never  Vaping Use   Vaping status: Never Used  Substance and Sexual Activity   Alcohol use: Yes    Comment: Occasionally 2 to 3 times a month   Drug use: Not Currently    Types: Marijuana    Comment: Smoked via a bowl occassionally.    Sexual activity: Not on file  Other Topics Concern    Not on file  Social History Narrative   Not on file   Social Drivers of Health   Financial Resource Strain: Low Risk  (10/24/2023)   Overall Financial Resource Strain (CARDIA)    Difficulty of Paying Living Expenses: Not hard at all  Food Insecurity: No Food Insecurity (10/24/2023)   Hunger Vital Sign    Worried About Running Out of Food in the Last Year: Never true    Ran Out of Food in the Last Year: Never true  Transportation Needs: No Transportation Needs (10/24/2023)   PRAPARE - Administrator, Civil Service (Medical): No    Lack of Transportation (Non-Medical): No  Physical Activity: Sufficiently Active (10/24/2023)   Exercise Vital Sign    Days of Exercise per Week: 6 days    Minutes of Exercise per Session: 90 min  Stress: No Stress Concern Present (10/24/2023)   [employer] of Occupational Health - Occupational Stress Questionnaire    Feeling of Stress: Not at all  Social Connections: Moderately Integrated (10/24/2023)   Social Connection and Isolation Panel    Frequency of Communication with Friends and Family: Once a week    Frequency of Social Gatherings with Friends and Family: Once a week    Attends Religious Services: 1 to 4 times per year  Active Member of Clubs or Organizations: Yes    Attends Banker Meetings: More than 4 times per year    Marital Status: Married    Review of Systems Per HPI  Objective:  BP (!) 143/84   Pulse 75   Temp 98.4 F (36.9 C)   Ht 5' 8 (1.727 m)   Wt 176 lb (79.8 kg)   SpO2 99%   BMI 26.76 kg/m      12/11/2023    4:12 PM 11/21/2023   10:48 AM 11/15/2023    9:58 AM  BP/Weight  Systolic BP 143 151 149  Diastolic BP 84 83 90  Wt. (Lbs) 176 174 177  BMI 26.76 kg/m2 26.46 kg/m2 26.91 kg/m2    Physical Exam Vitals and nursing note reviewed.  Constitutional:      General: He is not in acute distress.    Appearance: Normal appearance.  HENT:     Head: Normocephalic and atraumatic.  Eyes:      General:        Right eye: No discharge.        Left eye: No discharge.     Conjunctiva/sclera: Conjunctivae normal.  Pulmonary:     Effort: Pulmonary effort is normal. No respiratory distress.  Musculoskeletal:     Comments: Large mass to the posterior distal thigh (right)  Neurological:     Mental Status: He is alert.  Psychiatric:        Mood and Affect: Mood normal.        Behavior: Behavior normal.     Lab Results  Component Value Date   WBC 5.0 10/30/2023   HGB 13.9 10/30/2023   HCT 42.3 10/30/2023   PLT 224 10/30/2023   GLUCOSE 120 (H) 10/30/2023   CHOL 169 10/25/2023   TRIG 82 10/25/2023   HDL 67 10/25/2023   LDLCALC 87 10/25/2023   ALT 28 10/30/2023   AST 20 10/30/2023   NA 136 10/30/2023   K 3.6 10/30/2023   CL 102 10/30/2023   CREATININE 1.33 (H) 10/30/2023   BUN 16 10/30/2023   CO2 26 10/30/2023   TSH 1.426 03/17/2013   INR 1.05 07/08/2015   HGBA1C 6.4 (H) 10/25/2023     Assessment & Plan:  Preoperative examination Assessment & Plan: Low risk via Revised Cardiac Risk Index. Discussed with Oncology. Will need bridging. Discussed with Pharmacist Recardo Griffin. Will plan to stop Warfarin 5 days prior, start Lovenox  3 days prior with last dose of Lovenox  ~ 24 hours prior to surgery.      Jacqulyn Ahle DO [facility]

## 2023-12-11 NOTE — Patient Instructions (Signed)
 We will take care of the bridging.  Please have the surgery office send paperwork.

## 2023-12-11 NOTE — Telephone Encounter (Addendum)
 9/6: Last dose of warfarin.  9/7: No warfarin or enoxaparin  (Lovenox ).  9/8: Inject enoxaparin  80mg  in the fatty abdominal tissue at least 2 inches from the belly button twice a day about 12 hours apart, 8am and 8pm rotate sites. No warfarin.  9/9: Inject enoxaparin  80mg  in the fatty tissue every 12 hours, 8am and 8pm. No warfarin.  9/10: Inject enoxaparin  80 mg in the fatty tissue every 12 hours, 8am and 8pm. No warfarin.  9/11: Inject enoxaparin  80 in the fatty tissue in the morning at 8 am (No PM dose). No warfarin.  9/12: Procedure Day - No enoxaparin  - Resume warfarin in the evening or as directed by doctor (take an extra half tablet with usual dose for 2 days then resume normal dose).  9/13: Resume enoxaparin  80mg  inject in the fatty tissue every 12 hours and take warfarin  9/14: Inject enoxaparin  80mg  in the fatty tissue every 12 hours and take warfarin  9/15: Inject enoxaparin  80mg  in the fatty tissue every 12 hours and take warfarin  9/16: Inject enoxaparin  80mg  in the fatty tissue every 12 hours and take warfarin  9/17: Inject enoxaparin  80mg   in the fatty tissue every 12 hours and take warfarin  9/18: warfarin appt to check INR.

## 2024-01-04 DIAGNOSIS — G4700 Insomnia, unspecified: Secondary | ICD-10-CM | POA: Diagnosis not present

## 2024-01-04 DIAGNOSIS — E119 Type 2 diabetes mellitus without complications: Secondary | ICD-10-CM | POA: Diagnosis not present

## 2024-01-04 DIAGNOSIS — I129 Hypertensive chronic kidney disease with stage 1 through stage 4 chronic kidney disease, or unspecified chronic kidney disease: Secondary | ICD-10-CM | POA: Diagnosis not present

## 2024-01-04 DIAGNOSIS — N183 Chronic kidney disease, stage 3 unspecified: Secondary | ICD-10-CM | POA: Diagnosis not present

## 2024-01-07 ENCOUNTER — Telehealth: Payer: Self-pay | Admitting: *Deleted

## 2024-01-07 ENCOUNTER — Telehealth: Payer: Self-pay | Admitting: Pharmacist

## 2024-01-07 ENCOUNTER — Other Ambulatory Visit: Payer: Self-pay | Admitting: Family Medicine

## 2024-01-07 DIAGNOSIS — Z86711 Personal history of pulmonary embolism: Secondary | ICD-10-CM

## 2024-01-07 MED ORDER — ENOXAPARIN SODIUM 80 MG/0.8ML IJ SOSY: 80.0000 mg | PREFILLED_SYRINGE | Freq: Two times a day (BID) | INTRAMUSCULAR | 0 refills | Status: DC

## 2024-01-07 NOTE — Telephone Encounter (Unsigned)
 Copied from CRM [id number]. Topic: Clinical - Prescription Issue >> Jan 07, 2024 11:03 AM Tobias CROME wrote: Reason for CRM: Patient states [facility] stating that [facility] are out of stock of enoxaparin  (LOVENOX ) 80 MG/0.8ML injection.   Patient states he needs to start that medication today as he has an upcoming surgery. Patient requesting for prescription to be sent in to [facility] [place], [place] - [id number] [place] [address] Phone: [phone] Fax: [phone] Hours: Not open 24 hours  Patient requesting call once prescription has been sent to [facility].

## 2024-01-07 NOTE — Telephone Encounter (Signed)
   Warfarin/Lovenox  Bridge Plan for procedure on 01/11/24  9/6: Last dose of warfarin.   9/7: No warfarin or enoxaparin  (Lovenox ).   9/8: Inject enoxaparin  80mg  in the fatty abdominal tissue at least 2 inches from the belly button twice a day about 12 hours apart, 8am and 8pm rotate sites. No warfarin.   9/9: Inject enoxaparin  80mg  in the fatty tissue every 12 hours, 8am and 8pm. No warfarin.   9/10: Inject enoxaparin  80 mg in the fatty tissue every 12 hours, 8am and 8pm. No warfarin.   9/11: Inject enoxaparin  80 in the fatty tissue in the morning at 8 am (No PM dose). No warfarin.   9/12: Procedure Day - No enoxaparin  - Resume warfarin in the evening or as directed by doctor (take an extra half tablet with usual dose for 2 days then resume normal dose).   9/13: Resume enoxaparin  80mg  inject in the fatty tissue every 12 hours and take warfarin   9/14: Inject enoxaparin  80mg  in the fatty tissue every 12 hours and take warfarin   9/15: Inject enoxaparin  80mg  in the fatty tissue every 12 hours and take warfarin   9/16: Inject enoxaparin  80mg  in the fatty tissue every 12 hours and take warfarin   9/17: Inject enoxaparin  80mg   in the fatty tissue every 12 hours and take warfarin   9/18: warfarin appt to check INR.      Reviewed warfarin bridge plan with patient Appreciate assistance from heart care PharmD Printed plan for patient and left a copy up front for him to pick up Lovenox  80mg  [facility] [place] per patient request Discussed with PCP Patient verbalized understanding INR recheck to be scheduled on 01/17/24   Mliss Tarry Griffin, PharmD, BCACP, CPP Clinical Pharmacist, [facility]

## 2024-01-08 ENCOUNTER — Other Ambulatory Visit: Payer: Self-pay | Admitting: Family Medicine

## 2024-01-08 NOTE — Telephone Encounter (Signed)
 Cook, Jayce G, DO      01/07/24  2:25 PM Rx sent in.

## 2024-01-11 DIAGNOSIS — M7989 Other specified soft tissue disorders: Secondary | ICD-10-CM | POA: Diagnosis not present

## 2024-01-11 DIAGNOSIS — C4921 Malignant neoplasm of connective and soft tissue of right lower limb, including hip: Secondary | ICD-10-CM | POA: Diagnosis not present

## 2024-01-17 DIAGNOSIS — I2699 Other pulmonary embolism without acute cor pulmonale: Secondary | ICD-10-CM | POA: Diagnosis not present

## 2024-01-21 ENCOUNTER — Telehealth: Payer: Self-pay | Admitting: Family Medicine

## 2024-01-21 NOTE — Telephone Encounter (Signed)
 Patient is currently taking 6mg  5 days a week, then 5mg  on the weekend, since patient received results he has taken 2.5mg  additionally daily.  Please review [facility] results.  They need a standing order for future if he is going to continue to go to [facility] for testing.  Patient has been going monthly.

## 2024-01-21 NOTE — Telephone Encounter (Signed)
 Cook, Jayce G, DO     01/21/24 11:14 AM Who drew the INR? I don't have any record of this.

## 2024-01-21 NOTE — Telephone Encounter (Signed)
 Spoke with patient to schedule his AWV and he wanted someone to get back with him in reference to his INR being 1.4.   He would like to know what he needs to adjust with his medication  Thank you,  Corean,  AMB Clinical Support [facility] AWV Program Direct Dial ??[phone]

## 2024-01-22 ENCOUNTER — Other Ambulatory Visit: Payer: Self-pay | Admitting: Family Medicine

## 2024-01-22 ENCOUNTER — Telehealth: Payer: Self-pay | Admitting: Pharmacist

## 2024-01-22 DIAGNOSIS — Z86711 Personal history of pulmonary embolism: Secondary | ICD-10-CM

## 2024-01-22 NOTE — Telephone Encounter (Addendum)
 Patient s/p biopsy  Followed warfarin/bridge protocol  Usual home dose: Warfarin 6mg  Monday-Friday Warfarin 5mg  on sat/Sunday  Patient has been adding an extra 2.5mg  daily since 01/17/24, lovenox  complete  Redrawing INR today at [facility] Awaiting results  INR at goal-2.2 Resume usual home dose & recheck in 2 weeks Usual home dose: Warfarin 6mg  Monday-Friday Warfarin 5mg  on sat/Sunday  Katelyn Kohlmeyer Dattero Jayle Solarz, PharmD, BCACP, CPP Clinical Pharmacist, [facility]

## 2024-01-23 ENCOUNTER — Telehealth: Payer: Self-pay | Admitting: Pharmacist

## 2024-01-23 ENCOUNTER — Ambulatory Visit: Payer: Self-pay | Admitting: Family Medicine

## 2024-01-23 ENCOUNTER — Other Ambulatory Visit: Payer: Self-pay | Admitting: Family Medicine

## 2024-01-23 LAB — PROTIME-INR
INR: 2.2 — ABNORMAL HIGH (ref 0.9–1.2)
Prothrombin Time: 22.5 s — ABNORMAL HIGH (ref 9.1–12.0)

## 2024-01-23 NOTE — Telephone Encounter (Signed)
 Can you make sure patient has 2 week follow up for INR recheck (week of 10/8) with Dr. Bluford Will need INR done in office (not [facility]) Resuming normal dose of 6mg  Mon-Fri, 5mg  sat/sun I'm unable to schedule and wasn't sure who to route to Patient awaiting call today for appt Thank you!

## 2024-01-24 NOTE — Telephone Encounter (Signed)
 Patient scheduled for INR in office -02/05/2024 10:30 AM - left message to return call- may tell patient when he returns call

## 2024-01-29 ENCOUNTER — Telehealth: Payer: Self-pay

## 2024-01-29 DIAGNOSIS — Z86711 Personal history of pulmonary embolism: Secondary | ICD-10-CM | POA: Diagnosis not present

## 2024-01-29 DIAGNOSIS — C499 Malignant neoplasm of connective and soft tissue, unspecified: Secondary | ICD-10-CM | POA: Diagnosis not present

## 2024-01-29 DIAGNOSIS — N1831 Chronic kidney disease, stage 3a: Secondary | ICD-10-CM | POA: Diagnosis not present

## 2024-01-29 DIAGNOSIS — E1122 Type 2 diabetes mellitus with diabetic chronic kidney disease: Secondary | ICD-10-CM | POA: Diagnosis not present

## 2024-01-29 DIAGNOSIS — M7989 Other specified soft tissue disorders: Secondary | ICD-10-CM | POA: Diagnosis not present

## 2024-01-29 NOTE — Telephone Encounter (Signed)
 Copied from CRM #[id number]. Topic: Clinical - Medication Question >> Jan 29, 2024  3:54 PM Edsel HERO wrote: Corey Murphy with [facility] called to discuss taking patient off Warfarin proior to his surgery. [phone]

## 2024-01-30 ENCOUNTER — Telehealth: Payer: Self-pay

## 2024-01-30 NOTE — Telephone Encounter (Signed)
 Prescription Request  01/30/2024  LOV: Visit date not found  What is the name of the medication or equipment? warfarin (COUMADIN) 1 MG tablet   Have you contacted your pharmacy to request a refill? Yes   Which pharmacy would you like this sent to?   [facility]   Patient notified that their request is being sent to the clinical staff for review and that they should receive a response within 2 business days.   Please advise at Mobile [phone] (mobile)

## 2024-01-30 NOTE — Telephone Encounter (Signed)
 Cook, Jayce G, DO  Pruitt, Julie D, [facility]       01/29/24  5:00 PM He just had surgery. Why is he having another surgery? I recommend this be handled by the Coumadin clinic. This was such a difficult process.

## 2024-01-31 NOTE — Telephone Encounter (Signed)
 Per patient [facility] is handling his coumadin instructions and lovanox was already sent in by them, also pt states he prefers to have his coumadin checked via [facility] as a standing order instead of a coumadin clinic.

## 2024-01-31 NOTE — Telephone Encounter (Signed)
 Cook, Jayce G, DO      01/30/24  9:10 PM Please reach out to the patient. He just had surgery and went through the bridge and now they want to operate again. Recommend referral to the Coumadin clinic.

## 2024-02-01 ENCOUNTER — Other Ambulatory Visit: Payer: Self-pay

## 2024-02-01 ENCOUNTER — Ambulatory Visit

## 2024-02-01 NOTE — Telephone Encounter (Signed)
 Cook, Jayce G, DO      01/31/24  1:14 PM Please refill

## 2024-02-03 ENCOUNTER — Other Ambulatory Visit: Payer: Self-pay | Admitting: Family Medicine

## 2024-02-03 DIAGNOSIS — Z86711 Personal history of pulmonary embolism: Secondary | ICD-10-CM

## 2024-02-03 MED ORDER — WARFARIN SODIUM 1 MG PO TABS: 1.0000 mg | ORAL_TABLET | Freq: Every day | ORAL | 3 refills | Status: DC

## 2024-02-04 ENCOUNTER — Telehealth: Payer: Self-pay | Admitting: Pharmacist

## 2024-02-04 NOTE — Telephone Encounter (Signed)
 Patient states procedure is on the 10th He is using our protocol to bridge INR pre/post surgery along with his surgeon Patient will need follow up with PCP 1 week after procedure.  Corey Murphy Dattero [name], PharmD, BCACP, CPP Clinical Pharmacist, [facility]

## 2024-02-05 ENCOUNTER — Ambulatory Visit

## 2024-02-05 NOTE — Telephone Encounter (Signed)
 Patient has spoken with clinical pharmacist and has follow up office visit scheduled with Dr Bluford 02/11/24

## 2024-02-08 DIAGNOSIS — Z7901 Long term (current) use of anticoagulants: Secondary | ICD-10-CM | POA: Diagnosis not present

## 2024-02-08 DIAGNOSIS — Z87891 Personal history of nicotine dependence: Secondary | ICD-10-CM | POA: Diagnosis not present

## 2024-02-08 DIAGNOSIS — C499 Malignant neoplasm of connective and soft tissue, unspecified: Secondary | ICD-10-CM | POA: Diagnosis not present

## 2024-02-08 DIAGNOSIS — E1122 Type 2 diabetes mellitus with diabetic chronic kidney disease: Secondary | ICD-10-CM | POA: Diagnosis not present

## 2024-02-08 DIAGNOSIS — Z86711 Personal history of pulmonary embolism: Secondary | ICD-10-CM | POA: Diagnosis not present

## 2024-02-08 DIAGNOSIS — M7989 Other specified soft tissue disorders: Secondary | ICD-10-CM | POA: Diagnosis not present

## 2024-02-08 DIAGNOSIS — I130 Hypertensive heart and chronic kidney disease with heart failure and stage 1 through stage 4 chronic kidney disease, or unspecified chronic kidney disease: Secondary | ICD-10-CM | POA: Diagnosis not present

## 2024-02-08 DIAGNOSIS — I509 Heart failure, unspecified: Secondary | ICD-10-CM | POA: Diagnosis not present

## 2024-02-08 DIAGNOSIS — K5900 Constipation, unspecified: Secondary | ICD-10-CM | POA: Diagnosis not present

## 2024-02-08 DIAGNOSIS — Z79899 Other long term (current) drug therapy: Secondary | ICD-10-CM | POA: Diagnosis not present

## 2024-02-08 DIAGNOSIS — C4921 Malignant neoplasm of connective and soft tissue of right lower limb, including hip: Secondary | ICD-10-CM | POA: Diagnosis not present

## 2024-02-08 DIAGNOSIS — N1831 Chronic kidney disease, stage 3a: Secondary | ICD-10-CM | POA: Diagnosis not present

## 2024-02-08 DIAGNOSIS — Z7984 Long term (current) use of oral hypoglycemic drugs: Secondary | ICD-10-CM | POA: Diagnosis not present

## 2024-02-11 ENCOUNTER — Ambulatory Visit (INDEPENDENT_AMBULATORY_CARE_PROVIDER_SITE_OTHER): Admitting: Family Medicine

## 2024-02-11 VITALS — BP 139/78 | Ht 68.0 in | Wt 186.0 lb

## 2024-02-11 DIAGNOSIS — I10 Essential (primary) hypertension: Secondary | ICD-10-CM

## 2024-02-11 DIAGNOSIS — L84 Corns and callosities: Secondary | ICD-10-CM | POA: Diagnosis not present

## 2024-02-11 DIAGNOSIS — C499 Malignant neoplasm of connective and soft tissue, unspecified: Secondary | ICD-10-CM | POA: Diagnosis not present

## 2024-02-11 DIAGNOSIS — Z86711 Personal history of pulmonary embolism: Secondary | ICD-10-CM | POA: Diagnosis not present

## 2024-02-11 DIAGNOSIS — G4700 Insomnia, unspecified: Secondary | ICD-10-CM

## 2024-02-11 MED ORDER — ZOLPIDEM TARTRATE 10 MG PO TABS: 5.0000 mg | ORAL_TABLET | Freq: Every day | ORAL | 3 refills | Status: AC

## 2024-02-11 NOTE — Assessment & Plan Note (Signed)
 Has upcoming oncology visit.

## 2024-02-11 NOTE — Assessment & Plan Note (Signed)
 BP stable.  Continue current medications.

## 2024-02-11 NOTE — Assessment & Plan Note (Signed)
INR today.

## 2024-02-11 NOTE — Assessment & Plan Note (Signed)
Stable.  Ambien refilled. 

## 2024-02-11 NOTE — Patient Instructions (Signed)
 Lab today.  We will call with results.  Follow up in 3 months.   Referral placed and medication sent in.

## 2024-02-11 NOTE — Progress Notes (Signed)
 Subjective:  Patient ID: Corey Murphy, male    DOB: 1950/07/30  Age: 73 y.o. MRN: [id number]  CC:   Chief Complaint  Patient presents with   Follow-up    HPI:  73 year old male presents for follow-up.  Patient had recent surgery to remove a mass to the posterior thigh.  Had to have repeat surgery for positive margins.  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  He has upcoming postoperative follow-up as well as an appointment with oncology.  Patient needs INR today.  He is still on Lovenox .  He increased his warfarin to a total of 9 mg daily over the past 2 days.  He normally takes 6 mg daily.  Patient needs refill on Ambien  for insomnia.  Patient would also like to see podiatry as she has significant calluses on his feet and has difficulty reaching them.  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   CKD (chronic kidney disease) stage 3, GFR 30-59 ml/min (HCC) 06/06/2013   Essential hypertension, benign     Social Hx   Social History   Socioeconomic History   Marital status: Married    Spouse name: Not on file   Number of children: 2   Years of education: Not on file   Highest education level: Master's degree (e.g., MA, MS, MEng, MEd, MSW, MBA)  Occupational History   Occupation: Retired, Set designer  Tobacco Use   Smoking status: Former    Current packs/day: 0.00    Average packs/day: 1 pack/day for 12.0 years (12.0 ttl pk-yrs)    Types: Cigarettes    Start date: 05/28/1996    Quit date: 05/28/2008    Years since quitting: 15.7   Smokeless tobacco: Never  Vaping Use   Vaping status: Never Used  Substance and Sexual Activity    Alcohol use: Yes    Comment: Occasionally 2 to 3 times a month   Drug use: Not Currently    Types: Marijuana    Comment: Smoked via a bowl occassionally.    Sexual activity: Not on file  Other Topics Concern   Not on file  Social History Narrative   Not on file   Social Drivers of Health   Financial Resource Strain: Low Risk  (02/11/2024)   Overall Financial Resource Strain (CARDIA)    Difficulty of Paying Living Expenses: Not hard at all  Food Insecurity: No Food Insecurity (02/11/2024)   Hunger Vital Sign    Worried About Running Out of Food in the Last Year: Never true    Ran Out of Food in the Last Year: Never true  Transportation Needs: No Transportation Needs (02/11/2024)   PRAPARE - Administrator, Civil Service (Medical): No    Lack of Transportation (Non-Medical): No  Physical Activity: Sufficiently Active (02/11/2024)   Exercise Vital Sign    Days of Exercise per Week: 5 days    Minutes of Exercise per Session: 60 min  Stress: Stress Concern Present (02/11/2024)   [employer] of Occupational Health - Occupational Stress Questionnaire    Feeling of Stress: To some extent  Social Connections: Moderately Integrated (02/11/2024)   Social Connection and Isolation Panel    Frequency of Communication with  Friends and Family: Once a week    Frequency of Social Gatherings with Friends and Family: Once a week    Attends Religious Services: More than 4 times per year    Active Member of [employer] or Organizations: Yes    Attends Banker Meetings: 1 to 4 times per year    Marital Status: Married    Review of Systems Per HPI  Objective:  BP 139/78   Ht 5' 8 (1.727 m)   Wt 186 lb (84.4 kg)   BMI 28.28 kg/m      02/11/2024    9:27 AM 12/11/2023    4:12 PM 11/21/2023   10:48 AM  BP/Weight  Systolic BP 139 143 151  Diastolic BP 78 84 83  Wt. (Lbs) 186 176 174  BMI 28.28 kg/m2 26.76 kg/m2 26.46 kg/m2    Physical Exam Vitals and  nursing note reviewed.  Constitutional:      General: He is not in acute distress.    Appearance: Normal appearance.  HENT:     Head: Normocephalic and atraumatic.  Cardiovascular:     Rate and Rhythm: Normal rate and regular rhythm.  Pulmonary:     Effort: Pulmonary effort is normal.     Breath sounds: Normal breath sounds.  Neurological:     Mental Status: He is alert.  Psychiatric:        Mood and Affect: Mood normal.        Behavior: Behavior normal.     Lab Results  Component Value Date   WBC 5.0 10/30/2023   HGB 13.9 10/30/2023   HCT 42.3 10/30/2023   PLT 224 10/30/2023   GLUCOSE 120 (H) 10/30/2023   CHOL 169 10/25/2023   TRIG 82 10/25/2023   HDL 67 10/25/2023   LDLCALC 87 10/25/2023   ALT 28 10/30/2023   AST 20 10/30/2023   NA 136 10/30/2023   K 3.6 10/30/2023   CL 102 10/30/2023   CREATININE 1.33 (H) 10/30/2023   BUN 16 10/30/2023   CO2 26 10/30/2023   TSH 1.426 03/17/2013   INR 2.2 (H) 01/22/2024   HGBA1C 6.4 (H) 10/25/2023     Assessment & Plan:  Essential hypertension, benign Assessment & Plan: BP stable.  Continue current medications.   Foot callus -     Ambulatory referral to Podiatry  History of pulmonary embolism Assessment & Plan: INR today.  Orders: -     Protime-INR  Insomnia, unspecified type Assessment & Plan: Stable. Ambien  refilled.  Orders: -     Zolpidem  Tartrate; Take 0.5 tablets (5 mg total) by mouth at bedtime.  Dispense: 30 tablet; Refill: 3  Liposarcoma (HCC) Assessment & Plan: Has upcoming oncology visit.     Follow-up:  3 months  Creighton Longley Bluford DO [facility]

## 2024-02-12 ENCOUNTER — Ambulatory Visit: Payer: Self-pay | Admitting: Family Medicine

## 2024-02-12 ENCOUNTER — Telehealth: Payer: Self-pay

## 2024-02-12 DIAGNOSIS — Z9889 Other specified postprocedural states: Secondary | ICD-10-CM | POA: Diagnosis not present

## 2024-02-12 DIAGNOSIS — N2889 Other specified disorders of kidney and ureter: Secondary | ICD-10-CM | POA: Diagnosis not present

## 2024-02-12 DIAGNOSIS — C499 Malignant neoplasm of connective and soft tissue, unspecified: Secondary | ICD-10-CM | POA: Diagnosis not present

## 2024-02-12 DIAGNOSIS — R911 Solitary pulmonary nodule: Secondary | ICD-10-CM | POA: Diagnosis not present

## 2024-02-12 DIAGNOSIS — Z7409 Other reduced mobility: Secondary | ICD-10-CM | POA: Diagnosis not present

## 2024-02-12 DIAGNOSIS — N183 Chronic kidney disease, stage 3 unspecified: Secondary | ICD-10-CM | POA: Diagnosis not present

## 2024-02-12 DIAGNOSIS — R531 Weakness: Secondary | ICD-10-CM | POA: Diagnosis not present

## 2024-02-12 DIAGNOSIS — Z483 Aftercare following surgery for neoplasm: Secondary | ICD-10-CM | POA: Diagnosis not present

## 2024-02-12 DIAGNOSIS — I509 Heart failure, unspecified: Secondary | ICD-10-CM | POA: Diagnosis not present

## 2024-02-12 DIAGNOSIS — R600 Localized edema: Secondary | ICD-10-CM | POA: Diagnosis not present

## 2024-02-12 DIAGNOSIS — M7989 Other specified soft tissue disorders: Secondary | ICD-10-CM | POA: Diagnosis not present

## 2024-02-12 DIAGNOSIS — N281 Cyst of kidney, acquired: Secondary | ICD-10-CM | POA: Diagnosis not present

## 2024-02-12 LAB — PROTIME-INR
INR: 1.8 — ABNORMAL HIGH (ref 0.9–1.2)
Prothrombin Time: 19.3 s — ABNORMAL HIGH (ref 9.1–12.0)

## 2024-02-12 NOTE — Progress Notes (Signed)
   02/12/2024  Patient ID: Corey Murphy, male   DOB: April 22, 1951, 73 y.o.   MRN: [id number]  Patient with PMH of pulmonary embolism on long-term anticoagulation with warfarin- goal INR 2-3.  Patient currently bridged with enoxaparin  1mg /kg BID (80mg  BID) after recent surgical procedure on 10/10.  Usual warfarin regimen is 6mg  daily Monday-Friday and 5mg  daily Saturday and Sunday.  Patient has been taking 9mg  of warfarin the past 2 days while bridging with enoxaparin , and INR today was subtherapeutic at 1.8.  I would recommend patient complete 5 days of post-procedural bridge of enoxaparin  80mg  BID, as recommended by ortho on 10/10.  Based on warfarin's PK, INR can continue to increase 36-72 hours after resuming therapy; so I would recommend 1 more 9mg  daily dose of warfarin, then patient should resume normal daily dosing of 6mg  daily Monday-Friday and 5mg  daily Saturday and Sunday with a follow-up INR in 1 week.  Channing DELENA Mealing, PharmD, DPLA

## 2024-02-18 ENCOUNTER — Other Ambulatory Visit: Payer: Self-pay | Admitting: Family Medicine

## 2024-02-18 DIAGNOSIS — Z4789 Encounter for other orthopedic aftercare: Secondary | ICD-10-CM | POA: Diagnosis not present

## 2024-02-18 DIAGNOSIS — Z7901 Long term (current) use of anticoagulants: Secondary | ICD-10-CM

## 2024-02-18 DIAGNOSIS — C499 Malignant neoplasm of connective and soft tissue, unspecified: Secondary | ICD-10-CM | POA: Diagnosis not present

## 2024-02-18 NOTE — Progress Notes (Signed)
 Orthopedic Established Patient Outpatient Note   History of Present Illness The patient presents for liposarcoma.  He reports no discomfort from the drain. Initially, the drainage was bloody, but it has since cleared. The volume of fluid drained appears to be influenced by his level of activity, with increased drainage during periods of physical exertion such as walking or going out. Over the past few days, the drain has been producing approximately 60 mL of fluid daily.    SOCIAL HISTORY Exercise: Walking    History   Medical History[1]  Surgical History[2]  Allergies[3]  Prior to Admission medications  Medication Sig Start Date End Date Taking? Authorizing Provider  carvediloL  (COREG ) 3.125 mg tablet Take 3.125 mg by mouth 2 (two) times a day. 10/28/23 02/18/24 Yes HISTORICAL PROVIDER, CONVERSION  furosemide  (LASIX ) 20 mg tablet TAKE 1 TABLET (20 MG TOTAL) AS NEEDED (FOR WEIGHT GAIN OF 2-3 LBS OVERNIGHT OR 5 LBS IN ONE WEEK).   Yes HISTORICAL PROVIDER, CONVERSION  glimepiride  (AMARYL ) 4 mg tablet Take 4 mg by mouth 2 (two) times a day.   Yes HISTORICAL PROVIDER, CONVERSION  multivit,calc,min/FA/K1/lycop (ONE-A-DAY MEN'S COMPLETE ORAL) Take 1 tablet by mouth daily.   Yes HISTORICAL PROVIDER, CONVERSION  OneTouch Ultra Test test strip TEST TWICE A DAY 11/26/23  Yes HISTORICAL PROVIDER, CONVERSION  sennosides-docusate sodium (PERICOLACE) 8.6-50 mg per tablet Take 1 tablet by mouth daily. 01/12/24 04/11/24 Yes Prentice Lang Gain, MD  sildenafiL (VIAGRA) 25 mg tablet Take 75-100 mg by mouth daily as needed for erectile dysfunction.   Yes HISTORICAL PROVIDER, CONVERSION  simvastatin  (ZOCOR ) 40 mg tablet Take 40 mg by mouth at bedtime. 10/29/23  Yes HISTORICAL PROVIDER, CONVERSION  spironolactone  (ALDACTONE ) 25 mg tablet Take 25 mg by mouth daily. 01/29/23  Yes HISTORICAL PROVIDER, CONVERSION  suzetrigine 50 mg tab Take 1 tablet (50 mg total) by mouth 2 (two) times a day for 14 days. Start  with 100mg  on DOS prior to arrival (take with small sip of water ). Then resume regular BID dosing at 50 mg BID starting evening of POD 0. 02/08/24 02/22/24 Yes Montie LITTIE Cable, MD  traZODone  (DESYREL ) 50 mg tablet Take 50-100 mg by mouth at bedtime.   Yes HISTORICAL PROVIDER, CONVERSION  warfarin (COUMADIN) 1 mg tablet Take 1 tablet by mouth 5 days a week (patient takes 6 mg total during week and 5 mg on Sat and Sunday) 09/26/23  Yes HISTORICAL PROVIDER, CONVERSION  warfarin (COUMADIN) 5 mg tablet Take 5 mg by mouth daily. patient takes 6 mg total during week and 5 mg on Sat and Sunday) 08/07/23  Yes HISTORICAL PROVIDER, CONVERSION  zolpidem  (AMBIEN ) 10 mg tablet Take 5 mg by mouth nightly. 07/12/22  Yes HISTORICAL PROVIDER, CONVERSION    Social History[4]  Family History[5]  Review of Symptoms   Review of Systems - General ROS: positive for  - fatigue Respiratory ROS: positive for - shortness of breath Cardiovascular ROS: positive for - feeling of tightness, especially when trying to take a deep breath    Physical Examination   Heart Rate:  [77] 77 Resp:  [18] 18 BP: (124-157)/(70-81) 124/70  Physical Exam General: Patient appears in no acute distress. Integumentary: Drain site with clear fluid drainage, approximately 60 mL per day. Wound edges are well approximated with sutures and drain in place today. No drainage noted on dressing or evidence of infection noted on exam.   Physical Exam Vitals reviewed.  Constitutional:      General: He is not in acute distress.  Appearance: Normal appearance. He is normal weight. He is not ill-appearing.  Eyes:     Pupils: Pupils are equal, round, and reactive to light.  Cardiovascular:     Rate and Rhythm: Normal rate and regular rhythm.     Pulses: Normal pulses.     Heart sounds: No murmur heard.    No friction rub. No gallop.  Pulmonary:     Effort: No respiratory distress.     Breath sounds: Normal breath sounds. No wheezing or rales.      Comments: Increased effort noted on exam with normal breath sounds, bilaterally  Chest:     Chest wall: No tenderness.  Skin:    General: Skin is warm and dry.     Capillary Refill: Capillary refill takes less than 2 seconds.  Neurological:     General: No focal deficit present.     Mental Status: He is alert and oriented to person, place, and time.  Psychiatric:        Mood and Affect: Mood normal.        Behavior: Behavior normal.        Thought Content: Thought content normal.        Judgment: Judgment normal.         Assessment / Plan   Assessment & Plan 1. Liposarcoma. Margins are clear, indicating no residual tumor in the leg. The CT scan identified a hypodense area in the kidney and another in the left upper lobe of the lung, but there is no evidence of visceral metastasis within the chest, abdomen, and pelvis. The tumor board will discuss these findings tomorrow to determine the next steps, which may include a renal-focused MRI or biopsies of the lung and kidney. The patient will be informed of the outcome of this discussion.  We could not remove the drain today due to sustained output over 25 ml. They will monitor output over the next 48 hours, with the goal of output being less than 25 ml prior to removal on Thursday.   He will repeat INR tomorrow to determine if he needs adjustment to warfarin dose in context to bridging for surgery. He is having this done at his PCP's tomorrow and will ask for evaluation if symptoms do not improve or if they worsen. He is stable in the exam room today with normal findings on CT exam. He also notes that he has been congested recently and dealing with a mild head cold.    Diagnoses and all orders for this visit: Dedifferentiated liposarcoma    (CMD) Orthopedic aftercare   Orders This Encounter : No orders of the defined types were placed in this encounter.   No orders of the defined types were placed in this encounter.    Jameson Sherre Pollen, NP   Electronically Signed by: Jameson Sherre Pollen, NP, Nurse Practitioner 02/18/2024 4:35 PM       [1] Past Medical History: Diagnosis Date  . Cancer    (CMD) 2020   Smoldering myeloma  . Cataract 2019  . CHF (congestive heart failure)    (CMD) November 2014   Conjestive heart failure  . Chronic kidney disease 2017   Chronic kidney disease  . Hypertension   . Pulmonary embolism 11/25/2022  . Stroke    (CMD) November 2014   TIA  . TIA (transient ischemic attack) 2014  . Type 2 diabetes mellitus 2008  [2] Past Surgical History: Procedure Laterality Date  . CATARACT EXTRACTION Bilateral   .  CIRCUMCISION    . COLONOSCOPY    . OTHER SURGICAL HISTORY  2010   Hip replacement  . SOFT TISSUE BIOPSY Right 01/11/2024   BIOPSY/EXCISION LOWER EXTREMITY performed by Dorothe Jenkins [place], MD at [facility] OR  . SOFT TISSUE BIOPSY Right 02/08/2024   Tumor bed excision right posterior thigh performed by Montie LITTIE Cable, MD at [facility] OR  . SOFT TISSUE TUMOR RESECTION Right 01/11/2024   RESECTION TUMOR SOFT TISSUE performed by Dorothe Jenkins [place], MD at [facility] OR  . TISSUE EXPANDER REMOVAL Right 02/08/2024   Local tissue advancement right posterior thigh performed by Montie LITTIE Cable, MD at [facility] OR  . TOTAL HIP ARTHROPLASTY Bilateral   [3] Allergies Allergen Reactions  . Peanut Rash    *Cashews  [4] Social History Socioeconomic History  . Marital status: Married  Tobacco Use  . Smoking status: Former    Types: Cigarettes  . Smokeless tobacco: Never  Substance and Sexual Activity  . Alcohol use: Yes    Comment: may have a drink on the weekends while watching football  . Drug use: Never   Social Drivers of Health   Food Insecurity: No Food Insecurity (02/11/2024)   Received from [facility]   Food vital sign   . Within the past 12 months, you worried that your food would run out before you got money to buy more: Never true   . Within the past 12 months, the food you bought  just didn't last and you didn't have money to get more: Never true  Transportation Needs: No Transportation Needs (02/11/2024)   Received from [facility] - Transportation   . In the past 12 months, has lack of transportation kept you from medical appointments or from getting medications?: No   . In the past 12 months, has lack of transportation kept you from meetings, work, or from getting things needed for daily living?: No  Safety: Low Risk  (01/11/2024)   Safety   . How often does anyone, including family and friends, physically hurt you?: Never   . How often does anyone, including family and friends, insult or talk down to you?: Never   . How often does anyone, including family and friends, threaten you with harm?: Never   . How often does anyone, including family and friends, scream or curse at you?: Never  Living Situation: Low Risk  (02/11/2024)   Received from [facility] Situation   . In the last 12 months, was there a time when you were not able to pay the mortgage or rent on time?: No   . In the past 12 months, how many times have you moved where you were living?: 0   . At any time in the past 12 months, were you homeless or living in a shelter (including now)?: No  [5] No family history on file.

## 2024-02-19 ENCOUNTER — Ambulatory Visit

## 2024-02-19 ENCOUNTER — Other Ambulatory Visit: Payer: Self-pay | Admitting: *Deleted

## 2024-02-19 ENCOUNTER — Other Ambulatory Visit: Payer: Self-pay

## 2024-02-19 DIAGNOSIS — Z86711 Personal history of pulmonary embolism: Secondary | ICD-10-CM | POA: Diagnosis not present

## 2024-02-20 ENCOUNTER — Other Ambulatory Visit: Payer: Self-pay

## 2024-02-20 ENCOUNTER — Telehealth: Payer: Self-pay

## 2024-02-20 ENCOUNTER — Encounter (HOSPITAL_COMMUNITY): Payer: Self-pay

## 2024-02-20 ENCOUNTER — Emergency Department (HOSPITAL_COMMUNITY)

## 2024-02-20 ENCOUNTER — Emergency Department (HOSPITAL_COMMUNITY)
Admission: EM | Admit: 2024-02-20 | Discharge: 2024-02-20 | Disposition: A | Discharge status: Home / Self Care | Attending: Emergency Medicine | Admitting: Emergency Medicine

## 2024-02-20 DIAGNOSIS — C499 Malignant neoplasm of connective and soft tissue, unspecified: Secondary | ICD-10-CM | POA: Diagnosis not present

## 2024-02-20 DIAGNOSIS — E1122 Type 2 diabetes mellitus with diabetic chronic kidney disease: Secondary | ICD-10-CM | POA: Diagnosis not present

## 2024-02-20 DIAGNOSIS — Z7901 Long term (current) use of anticoagulants: Secondary | ICD-10-CM | POA: Insufficient documentation

## 2024-02-20 DIAGNOSIS — M79662 Pain in left lower leg: Secondary | ICD-10-CM | POA: Insufficient documentation

## 2024-02-20 DIAGNOSIS — Z79899 Other long term (current) drug therapy: Secondary | ICD-10-CM | POA: Insufficient documentation

## 2024-02-20 DIAGNOSIS — M79661 Pain in right lower leg: Secondary | ICD-10-CM | POA: Insufficient documentation

## 2024-02-20 DIAGNOSIS — Z7984 Long term (current) use of oral hypoglycemic drugs: Secondary | ICD-10-CM | POA: Insufficient documentation

## 2024-02-20 DIAGNOSIS — M79606 Pain in leg, unspecified: Secondary | ICD-10-CM

## 2024-02-20 DIAGNOSIS — M79605 Pain in left leg: Secondary | ICD-10-CM | POA: Diagnosis not present

## 2024-02-20 DIAGNOSIS — I129 Hypertensive chronic kidney disease with stage 1 through stage 4 chronic kidney disease, or unspecified chronic kidney disease: Secondary | ICD-10-CM | POA: Diagnosis not present

## 2024-02-20 DIAGNOSIS — R600 Localized edema: Secondary | ICD-10-CM | POA: Diagnosis not present

## 2024-02-20 DIAGNOSIS — M79604 Pain in right leg: Secondary | ICD-10-CM | POA: Diagnosis not present

## 2024-02-20 DIAGNOSIS — R0789 Other chest pain: Secondary | ICD-10-CM | POA: Diagnosis not present

## 2024-02-20 DIAGNOSIS — Z91010 Allergy to peanuts: Secondary | ICD-10-CM | POA: Insufficient documentation

## 2024-02-20 DIAGNOSIS — R791 Abnormal coagulation profile: Secondary | ICD-10-CM | POA: Diagnosis not present

## 2024-02-20 DIAGNOSIS — N189 Chronic kidney disease, unspecified: Secondary | ICD-10-CM | POA: Diagnosis not present

## 2024-02-20 DIAGNOSIS — Z8603 Personal history of neoplasm of uncertain behavior: Secondary | ICD-10-CM | POA: Diagnosis not present

## 2024-02-20 DIAGNOSIS — Z4789 Encounter for other orthopedic aftercare: Secondary | ICD-10-CM | POA: Diagnosis not present

## 2024-02-20 LAB — CBC WITH DIFFERENTIAL/PLATELET
Abs Immature Granulocytes: 0.01 K/uL (ref 0.00–0.07)
Basophils Absolute: 0 K/uL (ref 0.0–0.1)
Basophils Relative: 1 %
Eosinophils Absolute: 0 K/uL (ref 0.0–0.5)
Eosinophils Relative: 1 %
HCT: 37.4 % — ABNORMAL LOW (ref 39.0–52.0)
Hemoglobin: 12.3 g/dL — ABNORMAL LOW (ref 13.0–17.0)
Immature Granulocytes: 0 %
Lymphocytes Relative: 36 %
Lymphs Abs: 1.9 K/uL (ref 0.7–4.0)
MCH: 31.2 pg (ref 26.0–34.0)
MCHC: 32.9 g/dL (ref 30.0–36.0)
MCV: 94.9 fL (ref 80.0–100.0)
Monocytes Absolute: 0.6 K/uL (ref 0.1–1.0)
Monocytes Relative: 11 %
Neutro Abs: 2.7 K/uL (ref 1.7–7.7)
Neutrophils Relative %: 51 %
Platelets: 255 K/uL (ref 150–400)
RBC: 3.94 MIL/uL — ABNORMAL LOW (ref 4.22–5.81)
RDW: 14.2 % (ref 11.5–15.5)
WBC: 5.3 K/uL (ref 4.0–10.5)
nRBC: 0 % (ref 0.0–0.2)

## 2024-02-20 LAB — COMPREHENSIVE METABOLIC PANEL WITH GFR
ALT: 32 U/L (ref 0–44)
AST: 18 U/L (ref 15–41)
Albumin: 3.9 g/dL (ref 3.5–5.0)
Alkaline Phosphatase: 93 U/L (ref 38–126)
Anion gap: 8 (ref 5–15)
BUN: 15 mg/dL (ref 8–23)
CO2: 27 mmol/L (ref 22–32)
Calcium: 9.1 mg/dL (ref 8.9–10.3)
Chloride: 104 mmol/L (ref 98–111)
Creatinine, Ser: 1.27 mg/dL — ABNORMAL HIGH (ref 0.61–1.24)
GFR, Estimated: >60 mL/min (ref >60)
Glucose, Bld: 117 mg/dL — ABNORMAL HIGH (ref 70–99)
Potassium: 3.6 mmol/L (ref 3.5–5.1)
Sodium: 139 mmol/L (ref 135–145)
Total Bilirubin: 0.3 mg/dL (ref 0.0–1.2)
Total Protein: 8 g/dL (ref 6.5–8.1)

## 2024-02-20 LAB — TROPONIN T, HIGH SENSITIVITY
Troponin T High Sensitivity: <15 ng/L (ref 0–19)
Troponin T High Sensitivity: <15 ng/L (ref 0–19)

## 2024-02-20 LAB — PROTIME-INR
INR: 1.4 — ABNORMAL HIGH (ref 0.9–1.2)
INR: 1.4 — ABNORMAL HIGH (ref 0.8–1.2)
Prothrombin Time: 14.8 s — ABNORMAL HIGH (ref 9.1–12.0)
Prothrombin Time: 18.2 s — ABNORMAL HIGH (ref 11.4–15.2)

## 2024-02-20 NOTE — Discharge Instructions (Signed)
 Take your new warfarin dose as prescribed by your primary care doctor.  Return to the ER for new or worsening symptoms.

## 2024-02-20 NOTE — ED Provider Notes (Signed)
 I took over care of this patient at 3 PM  Physical Exam  BP (!) 168/89   Pulse 66   Temp 98.5 F (36.9 C) (Oral)   Resp (!) 22   Ht 5' 8 (1.727 m)   Wt 84.4 kg   SpO2 97%   BMI 28.28 kg/m   Physical Exam Vitals and nursing note reviewed.  Constitutional:      General: He is not in acute distress.    Appearance: He is well-developed.  HENT:     Head: Normocephalic and atraumatic.  Eyes:     Conjunctiva/sclera: Conjunctivae normal.  Cardiovascular:     Rate and Rhythm: Normal rate and regular rhythm.     Heart sounds: No murmur heard. Pulmonary:     Effort: Pulmonary effort is normal. No respiratory distress.     Breath sounds: Normal breath sounds.  Abdominal:     Palpations: Abdomen is soft.     Tenderness: There is no abdominal tenderness.  Musculoskeletal:        General: No swelling.     Cervical back: Neck supple.  Skin:    General: Skin is warm and dry.     Capillary Refill: Capillary refill takes less than 2 seconds.  Neurological:     Mental Status: He is alert.  Psychiatric:        Mood and Affect: Mood normal.     Procedures  Procedures  ED Course / MDM    Medical Decision Making Patient was negative DVT study.  INR subtherapeutic but primary care doctor has already changed his Coumadin dosing.  Patient is otherwise feeling well.  Advised to Supple primary care otherwise return to the ER for any worsening symptoms.  He feels comfortable being discharged.  Problems Addressed: Pain of lower extremity, unspecified laterality: undiagnosed new problem with uncertain prognosis Subtherapeutic international normalized ratio (INR): acute illness or injury  Amount and/or Complexity of Data Reviewed Radiology: ordered and independent interpretation performed. Decision-making details documented in ED Course.    Details: DVT study reviewed by me and negative for DVT or acute abnormality  Risk OTC drugs. Prescription drug management.           Corey Duwaine CROME, DO 02/20/24 [id number]

## 2024-02-20 NOTE — ED Provider Notes (Signed)
 [place] EMERGENCY DEPARTMENT AT [facility] Provider Note   CSN: [id number] Arrival date & time: 02/20/24  1133     Patient presents with: Chest Pain   Corey Murphy is a 73 y.o. male.    Chest Pain Patient presented with worry about swelling in his leg.  Had recent surgery to remove a tumor.  Had a drain.  States he had the drain removed and they told him that he is clotting.  Patient worried for DVT.  Has had previous pulmonary embolisms and is on Coumadin.  Had recent INR that came back 1.4.  Does have some slight shortness of breath although this is really unchanged.  No fevers.  No cough.  Does have some swelling to the leg but really had not been worried about it.    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 Authorizing Provider  carvedilol  (COREG ) 3.125 MG tablet Take 1 tablet (3.125 mg total) by mouth 2 (two) times daily. 10/28/23 02/20/24 Yes Cook, Jayce G, DO  furosemide  (LASIX ) 20 MG tablet TAKE 1 TABLET (20 MG TOTAL) AS NEEDED (FOR WEIGHT GAIN OF 2-3 LBS OVERNIGHT OR 5 LBS IN ONE WEEK). 08/29/23  Yes [name], MD  glimepiride  (AMARYL ) 4 MG tablet Take 4 mg by mouth 2 (two) times daily. 03/28/21  Yes [provider]  HYDROcodone -acetaminophen  (NORCO) 10-325 MG tablet Take 1 tablet by mouth every 6 (six) hours. 06/21/22  Yes [provider]  Multiple Vitamins-Iron (MULTIVITAMIN/IRON PO) Take 1 tablet  by mouth daily.    Yes [provider]  ondansetron  (ZOFRAN -ODT) 4 MG disintegrating tablet Take 4 mg by mouth every 8 (eight) hours as needed. 02/08/24  Yes [provider]  SENEXON-S 8.6-50 MG tablet Take 1 tablet by mouth daily.   Yes [provider]  sildenafil (VIAGRA) 25 MG tablet Take 25 mg by mouth as needed for erectile dysfunction.   Yes [provider]  simvastatin  (ZOCOR ) 40 MG tablet Take 1 tablet (40 mg total) by mouth at bedtime. 10/29/23  Yes Cook, Jayce G, DO  spironolactone  (ALDACTONE ) 25 MG tablet TAKE 1 TABLET (25 MG TOTAL) BY MOUTH DAILY. 01/29/23  Yes Parthenia Olivia HERO, PA-C  traZODone  (DESYREL ) 50 MG tablet Take 75 mg by mouth at bedtime. 01/29/20  Yes [provider]  VISINE 0.05 % ophthalmic solution SMARTSIG:1 Drop(s) In Eye(s) PRN 06/13/21  Yes [provider]  warfarin (COUMADIN) 1 MG tablet Take 1 tablet (1 mg total) by mouth daily. 1 mg 5 days weekly Patient taking differently: Take 1 mg by mouth daily. 5 mg on Sat and Sunday. 6 mg all other days of the week 02/03/24  Yes Cook, Jayce G, DO  warfarin (COUMADIN) 5 MG tablet Take 5 mg by mouth daily. Patient taking differently: Take 5 mg by mouth daily. 6mg  on mon-Friday, 5mg  on sat/sun  08/07/23  Yes [provider]  zolpidem  (AMBIEN ) 10 MG tablet Take 0.5 tablets (5 mg total) by mouth at bedtime. 02/11/24  Yes Cook, Jayce G, DO  ONETOUCH ULTRA test strip 1 each 2 (two) times daily. 03/24/20   [provider]    Allergies: Peanut-containing drug products    Review of Systems  Cardiovascular:  Positive for chest pain.    Updated Vital Signs BP (!) 181/95 (BP Location: Right Arm)   Pulse 80   Temp 98.5 F (36.9 C) (Oral)   Resp 18   Ht 5' 8 (1.727 m)   Wt 84.4 kg   SpO2 99%   BMI 28.28 kg/m   Physical Exam Vitals and nursing note reviewed.  Cardiovascular:     Rate and Rhythm: Regular rhythm.  Pulmonary:     Breath sounds: No wheezing.   Musculoskeletal:     Right lower leg: Edema present.     Comments: Some edema in right lower extremity greater than left lower extremity.  Healing wound on right posterior thigh.  Well-appearing with mild serous drainage.  Pulse intact distally.  Neurological:     Mental Status: He is alert.     (all labs ordered are listed, but only abnormal results are displayed) Labs Reviewed  CBC WITH DIFFERENTIAL/PLATELET - Abnormal; Notable for the following components:      Result Value   RBC 3.94 (*)    Hemoglobin 12.3 (*)    HCT 37.4 (*)    All other components within normal limits  COMPREHENSIVE METABOLIC PANEL WITH GFR - Abnormal; Notable for the following components:   Glucose, Bld 117 (*)    Creatinine, Ser 1.27 (*)    All other components within normal limits  PROTIME-INR - Abnormal; Notable for the following components:   Prothrombin Time 18.2 (*)    INR 1.4 (*)    All other components within normal limits  TROPONIN T, HIGH SENSITIVITY  TROPONIN T, HIGH SENSITIVITY    EKG: None  Radiology: DG Chest 2 View Result Date: 02/20/2024 EXAM: 2 VIEW(S) XRAY OF THE CHEST 02/20/2024 12:15:00 PM COMPARISON: 03/22/2023 CLINICAL HISTORY: chest pain. Pt c/o chest discomfort and pressure today, hx htn, dm, previous PE, ckd, cardiac cathed in 2014, hx MGUS, w/ sigmoid adenoma, nonischemic cardiomyopathy. FINDINGS: LUNGS AND PLEURA: No focal pulmonary opacity. No pulmonary edema. No pleural effusion. No pneumothorax. HEART AND MEDIASTINUM: No acute abnormality of the cardiac and mediastinal silhouettes. BONES AND SOFT TISSUES: Thoracic degenerative changes. IMPRESSION: 1. No acute cardiopulmonary process. Electronically signed by: Waddell Calk MD 02/20/2024 12:48 PM EDT RP Workstation: [id number]     Procedures   Medications Ordered in the ED - No data to display                                  Medical Decision Making Amount and/or Complexity of Data Reviewed Labs:  ordered. Radiology: ordered.   Patient with right leg swelling with recent surgery.  Previous pulmonary bulla some also.  Worried for clot.  Appears to be well appearing.  Large hematoma felt less likely.  However does have some swelling of the leg.  Easily could be postsurgical but with previous pulm embolism history and subtherapeutic INR is worrisome for clot.  Will get Doppler to evaluate.  Does have some mild chronic shortness of breath that is really unchanged.  States he is really only here because he was told the  leg was clotting.  Do not think we need empiric CT scan of the chest although I would likely reevaluate if Doppler does show a clot.  PCP is adjusting Coumadin dosing.  Care turned over to oncoming provider.     Final diagnoses:  None    ED Discharge Orders     None          Patsey Lot, MD 02/20/24 1445

## 2024-02-20 NOTE — Telephone Encounter (Signed)
 Copied from CRM [id number]. Topic: Clinical - Medical Advice >> Feb 20, 2024 11:43 AM Terri MATSU wrote: Reason for CRM: Patient is at the ER. His INR is below normal, it's at 1.4 and they don't know what to do because his blood is clotting and he wants Dr.Cook to call him ASAP. Callback number [phone]

## 2024-02-20 NOTE — ED Triage Notes (Signed)
 Pt arrived via POV c/o upper chest tightness and SOB. Pt reports dyspnea with exertion as well. Pt reports he went for a follow-up appointment to have his drain removed from his leg and Pt reports after the drain was removed following a surgery to have a tumor removed, medical staff advised they may be seeing some clots in drainage tube. Pt concerned for possible blood clots, and reports is on blood thinners.

## 2024-02-21 ENCOUNTER — Telehealth: Payer: Self-pay | Admitting: Pharmacist

## 2024-02-21 NOTE — Telephone Encounter (Signed)
Order has been released-

## 2024-02-25 DIAGNOSIS — B351 Tinea unguium: Secondary | ICD-10-CM | POA: Diagnosis not present

## 2024-02-25 DIAGNOSIS — M79674 Pain in right toe(s): Secondary | ICD-10-CM | POA: Diagnosis not present

## 2024-02-25 DIAGNOSIS — M79675 Pain in left toe(s): Secondary | ICD-10-CM | POA: Diagnosis not present

## 2024-02-25 DIAGNOSIS — E1142 Type 2 diabetes mellitus with diabetic polyneuropathy: Secondary | ICD-10-CM | POA: Diagnosis not present

## 2024-02-25 DIAGNOSIS — L851 Acquired keratosis [keratoderma] palmaris et plantaris: Secondary | ICD-10-CM | POA: Diagnosis not present

## 2024-02-26 DIAGNOSIS — Z9889 Other specified postprocedural states: Secondary | ICD-10-CM | POA: Diagnosis not present

## 2024-02-26 DIAGNOSIS — N183 Chronic kidney disease, stage 3 unspecified: Secondary | ICD-10-CM | POA: Diagnosis not present

## 2024-02-26 DIAGNOSIS — M7989 Other specified soft tissue disorders: Secondary | ICD-10-CM | POA: Diagnosis not present

## 2024-02-26 DIAGNOSIS — I081 Rheumatic disorders of both mitral and tricuspid valves: Secondary | ICD-10-CM | POA: Diagnosis not present

## 2024-02-26 DIAGNOSIS — Z4789 Encounter for other orthopedic aftercare: Secondary | ICD-10-CM | POA: Diagnosis not present

## 2024-02-26 DIAGNOSIS — I517 Cardiomegaly: Secondary | ICD-10-CM | POA: Diagnosis not present

## 2024-02-26 DIAGNOSIS — I5189 Other ill-defined heart diseases: Secondary | ICD-10-CM | POA: Diagnosis not present

## 2024-02-26 DIAGNOSIS — C499 Malignant neoplasm of connective and soft tissue, unspecified: Secondary | ICD-10-CM | POA: Diagnosis not present

## 2024-02-26 DIAGNOSIS — N281 Cyst of kidney, acquired: Secondary | ICD-10-CM | POA: Diagnosis not present

## 2024-02-26 DIAGNOSIS — I340 Nonrheumatic mitral (valve) insufficiency: Secondary | ICD-10-CM | POA: Diagnosis not present

## 2024-02-26 DIAGNOSIS — I319 Disease of pericardium, unspecified: Secondary | ICD-10-CM | POA: Diagnosis not present

## 2024-02-26 DIAGNOSIS — I509 Heart failure, unspecified: Secondary | ICD-10-CM | POA: Diagnosis not present

## 2024-02-26 DIAGNOSIS — Z483 Aftercare following surgery for neoplasm: Secondary | ICD-10-CM | POA: Diagnosis not present

## 2024-02-26 DIAGNOSIS — Z7409 Other reduced mobility: Secondary | ICD-10-CM | POA: Diagnosis not present

## 2024-02-26 DIAGNOSIS — R531 Weakness: Secondary | ICD-10-CM | POA: Diagnosis not present

## 2024-02-26 DIAGNOSIS — I361 Nonrheumatic tricuspid (valve) insufficiency: Secondary | ICD-10-CM | POA: Diagnosis not present

## 2024-02-26 DIAGNOSIS — R600 Localized edema: Secondary | ICD-10-CM | POA: Diagnosis not present

## 2024-02-27 ENCOUNTER — Other Ambulatory Visit: Payer: Self-pay | Admitting: Cardiology

## 2024-02-28 DIAGNOSIS — Z86711 Personal history of pulmonary embolism: Secondary | ICD-10-CM | POA: Diagnosis not present

## 2024-02-29 LAB — PROTIME-INR
INR: 1.7 — ABNORMAL HIGH (ref 0.9–1.2)
Prothrombin Time: 17.8 s — ABNORMAL HIGH (ref 9.1–12.0)

## 2024-03-02 ENCOUNTER — Ambulatory Visit: Payer: Self-pay | Admitting: Family Medicine

## 2024-03-03 ENCOUNTER — Other Ambulatory Visit: Payer: Self-pay | Admitting: Family Medicine

## 2024-03-03 ENCOUNTER — Encounter: Payer: Self-pay | Admitting: Pharmacist

## 2024-03-03 ENCOUNTER — Telehealth: Payer: Self-pay | Admitting: Pharmacist

## 2024-03-03 MED ORDER — APIXABAN 5 MG PO TABS
5.0000 mg | ORAL_TABLET | Freq: Two times a day (BID) | ORAL | 5 refills | Status: AC
Start: 2024-03-03 — End: ?

## 2024-03-03 NOTE — Telephone Encounter (Signed)
 INR 1.7  Patient interested in switching to Eliquis  Discussed with PCP that Eliquis  can be started when INR falls <2 Appropriate to switch today-->5mg  twice daily Free 30day coupon sent in my chart message  Mliss Tarry Griffin, PharmD, BCACP, CPP Clinical Pharmacist, [facility]

## 2024-03-04 DIAGNOSIS — N183 Chronic kidney disease, stage 3 unspecified: Secondary | ICD-10-CM | POA: Diagnosis not present

## 2024-03-04 DIAGNOSIS — Z483 Aftercare following surgery for neoplasm: Secondary | ICD-10-CM | POA: Diagnosis not present

## 2024-03-04 DIAGNOSIS — M7989 Other specified soft tissue disorders: Secondary | ICD-10-CM | POA: Diagnosis not present

## 2024-03-04 DIAGNOSIS — R531 Weakness: Secondary | ICD-10-CM | POA: Diagnosis not present

## 2024-03-04 DIAGNOSIS — Z7409 Other reduced mobility: Secondary | ICD-10-CM | POA: Diagnosis not present

## 2024-03-04 DIAGNOSIS — N281 Cyst of kidney, acquired: Secondary | ICD-10-CM | POA: Diagnosis not present

## 2024-03-04 DIAGNOSIS — C499 Malignant neoplasm of connective and soft tissue, unspecified: Secondary | ICD-10-CM | POA: Diagnosis not present

## 2024-03-04 DIAGNOSIS — Z9889 Other specified postprocedural states: Secondary | ICD-10-CM | POA: Diagnosis not present

## 2024-03-04 DIAGNOSIS — I509 Heart failure, unspecified: Secondary | ICD-10-CM | POA: Diagnosis not present

## 2024-03-04 DIAGNOSIS — R600 Localized edema: Secondary | ICD-10-CM | POA: Diagnosis not present

## 2024-03-06 DIAGNOSIS — N289 Disorder of kidney and ureter, unspecified: Secondary | ICD-10-CM | POA: Diagnosis not present

## 2024-03-06 DIAGNOSIS — N2889 Other specified disorders of kidney and ureter: Secondary | ICD-10-CM | POA: Diagnosis not present

## 2024-03-06 DIAGNOSIS — C499 Malignant neoplasm of connective and soft tissue, unspecified: Secondary | ICD-10-CM | POA: Diagnosis not present

## 2024-03-12 ENCOUNTER — Encounter (INDEPENDENT_AMBULATORY_CARE_PROVIDER_SITE_OTHER): Payer: Medicare HMO | Admitting: Ophthalmology

## 2024-03-13 NOTE — Telephone Encounter (Signed)
 Assisted PCP and patient with warfarin to Eliquis  transition plan Copay card sent to my chart message for 1st fill free

## 2024-03-17 ENCOUNTER — Other Ambulatory Visit: Payer: Self-pay | Admitting: Family Medicine

## 2024-03-24 DIAGNOSIS — M7989 Other specified soft tissue disorders: Secondary | ICD-10-CM | POA: Diagnosis not present

## 2024-03-24 DIAGNOSIS — C499 Malignant neoplasm of connective and soft tissue, unspecified: Secondary | ICD-10-CM | POA: Diagnosis not present

## 2024-04-01 DIAGNOSIS — C499 Malignant neoplasm of connective and soft tissue, unspecified: Secondary | ICD-10-CM | POA: Diagnosis not present

## 2024-04-03 DIAGNOSIS — C4921 Malignant neoplasm of connective and soft tissue of right lower limb, including hip: Secondary | ICD-10-CM | POA: Diagnosis not present

## 2024-04-03 DIAGNOSIS — C499 Malignant neoplasm of connective and soft tissue, unspecified: Secondary | ICD-10-CM | POA: Diagnosis not present

## 2024-04-03 DIAGNOSIS — R531 Weakness: Secondary | ICD-10-CM | POA: Diagnosis not present

## 2024-04-04 NOTE — Progress Notes (Deleted)
 " Cardiology Office Note:  .   Date:  04/04/2024  ID:  Corey Murphy, DOB Jan 12, 1951, MRN [id number] PCP: Bluford Jacqulyn MATSU, DO  [facility] Providers Cardiologist:  Jayson Sierras, MD {  History of Present Illness: .   Corey Murphy is a 73 y.o. male  with PMHx of chronic HFimpEF, HTN, HLD, Type 2 DM, history of PE, Stage III CKD and nocturnal pauses who reports to [place] office for 39-month follow up.   Chronic HFimpEF  EF 15% in 2014 with cardiac catheterization showing normal coronary arteries, EF normalized by repeat imaging Echo in 10/2022 showed a normal EF of 55 to 60%.  HTN Discontinued lisinopril  due to hypotension during hospitalization in 10/2022 HLD Type 2 DM History of PE Extensive bilateral acute pulmonary emboli diagnosed in July 2024  On Coumadin  with follow-up by [facility] Stage III CKD  Nocturnal pauses  Zio 11/2022: 8-second run of complete heart block which occurred during the nocturnal hours.  Sleep Study 03/2023: Negative for sleep apnea  Last seen in heartcare 10/16/2023 by Dr. Sierras for follow up.  Doing well from cardiology perspective without any cardiac complaints.  Continued on Coreg  3.125 mg twice daily, Lasix  20 mg as needed for edema, spironolactone  25 mg daily, simvastatin  20 mg daily, and Coumadin  per [place].  Today, reports ### and denies ###.  Denies chest pain, shortness of breath, palpitations, syncope, presyncope, dizziness, orthopnea, PND, swelling or significant weight changes, acute bleeding, or claudication.   Reports compliance with medications.  Dietary habitats:  Activity level:  Social: Denies tobacco use/alcohol/drug use  Denies any recent hospitalizations or visits to the emergency department.   Heart failure with improved ejection fraction (HFimpEF) (HCC) NICM (nonischemic cardiomyopathy) (HCC) Denies SOB, edema, orthopnea, or PND. Appears Euvolemic on exam.  Continue on Coreg  3.125 mg twice daily, Lasix  20 mg as needed for  edema, spironolactone  25 mg daily.  GDMT limited due to low blood pressure of .SABRA or AKI/CKD Encouraged low sodium diet, fluid restriction <2L, and daily weights.  Educated to contact our office for weight gain of 2 lbs overnight or 5 lbs in one week. ED precautions discussed.   Essential hypertension Reports well controlled Home BP:  BP this OV well controlled today:  Managed by GDMT as above.  Per Dr. Sierras, if additional antihypertensive indicated in the future then consider addition of low-dose losartan.  Encourage physical activity for 150 minutes per week and heart healthy low sodium diet. Discussed limiting sodium intake to < 2 grams daily.     Stage 3 chronic kidney disease, unspecified whether stage 3a or 3b CKD (HCC) ***  1st degree AV block Nocturnal Bradycardia Coreg  has been reduced to 3.125 mg twice daily, tolerating well with heart rate of # today  History of pulmonary embolus (PE) Continue Coumadin  per [place] He is planning to transition to our anticoagulation clinic eventually when this practice closes. He is also working on assistance program to get back on Eliquis .  ROS: 10 point review of system has been reviewed and considered negative except ones been listed in the HPI.   Studies Reviewed: .    Echocardiogram: 10/2022 IMPRESSIONS   1. Left ventricular ejection fraction, by estimation, is 55 to 60%. The  left ventricle has normal function. The left ventricle has no regional  wall motion abnormalities. There is mild asymmetric left ventricular  hypertrophy of the infero-lateral  segment. Indeterminate diastolic filling due to E-A fusion.   2. Right ventricular systolic function is  mildly reduced. The right  ventricular size is mildly enlarged. Tricuspid regurgitation signal is  inadequate for assessing PA pressure.   3. Right atrial size was severely dilated.   4. The mitral valve is normal in structure. Trivial mitral valve  regurgitation.   5. The  aortic valve is tricuspid. Aortic valve regurgitation is not  visualized.   6. The inferior vena cava is normal in size with greater than 50%  respiratory variability, suggesting right atrial pressure of 3 mmHg.   Comparison(s): Prior images reviewed side by side.      Event Monitor: 11/2022 ZIO monitor reviewed.  13 days, 21 hours analyzed.   Predominant rhythm is sinus with heart rate ranging from 49 bpm up to 145 bpm with average heart rate 76 bpm. There were rare PACs representing less than 1% total beats. There were rare PVCs including ventricular couplets and triplets representing less than 1% total beats.  Also limited episodes of ventricular bigeminy and trigeminy as well as 3 episodes of NSVT, the longest of which was 11 beats.  There were no sustained ventricular events. There was an 8-second episode of complete heart block noted at 4:28 AM on August 12 with narrow complex escape at 36 bpm.  Prolonged PR interval noted at baseline and there were limited brief episodes of second-degree type I Wenckebach conduction as well. No pauses.  Risk Assessment/Calculations:   {Does this patient have ATRIAL FIBRILLATION?:[phone]} No BP recorded.  {Refresh Note OR Click here to enter BP  :1}***   STOP-Bang Score:     { Consider Dx Sleep Disordered Breathing or Sleep Apnea  ICD G47.33          :1}    Physical Exam:   VS:  There were no vitals taken for this visit.   Wt Readings from Last 3 Encounters:  02/20/24 186 lb (84.4 kg)  02/11/24 186 lb (84.4 kg)  12/11/23 176 lb (79.8 kg)    GEN: Well nourished, well developed in no acute distress while sitting in chair.  NECK: No JVD; No carotid bruits CARDIAC: ***RRR, no murmurs, rubs, gallops RESPIRATORY:  Clear to auscultation without rales, wheezing or rhonchi  ABDOMEN: Soft, non-tender, non-distended EXTREMITIES:  No edema; No deformity   ASSESSMENT AND PLAN: .   ***    {Are you ordering a CV Procedure (e.g. stress test, cath,  DCCV, TEE, etc)?   Press F2        :[id number]}  Dispo: ***  Signed, Lorette CINDERELLA Kapur, PA-C  "

## 2024-04-09 ENCOUNTER — Ambulatory Visit: Admitting: Physician Assistant

## 2024-04-09 DIAGNOSIS — I428 Other cardiomyopathies: Secondary | ICD-10-CM

## 2024-04-09 DIAGNOSIS — I10 Essential (primary) hypertension: Secondary | ICD-10-CM

## 2024-04-09 DIAGNOSIS — Z86711 Personal history of pulmonary embolism: Secondary | ICD-10-CM

## 2024-04-09 DIAGNOSIS — R001 Bradycardia, unspecified: Secondary | ICD-10-CM

## 2024-04-09 DIAGNOSIS — I440 Atrioventricular block, first degree: Secondary | ICD-10-CM

## 2024-04-09 DIAGNOSIS — I5020 Unspecified systolic (congestive) heart failure: Secondary | ICD-10-CM

## 2024-04-09 DIAGNOSIS — N183 Chronic kidney disease, stage 3 unspecified: Secondary | ICD-10-CM

## 2024-04-09 DIAGNOSIS — C4921 Malignant neoplasm of connective and soft tissue of right lower limb, including hip: Secondary | ICD-10-CM | POA: Diagnosis not present

## 2024-04-21 ENCOUNTER — Encounter: Payer: Self-pay | Admitting: Family Medicine

## 2024-04-21 ENCOUNTER — Ambulatory Visit (INDEPENDENT_AMBULATORY_CARE_PROVIDER_SITE_OTHER): Admitting: Family Medicine

## 2024-04-21 VITALS — BP 142/87 | HR 70 | Temp 98.1°F | Ht 68.0 in | Wt 183.4 lb

## 2024-04-21 DIAGNOSIS — E782 Mixed hyperlipidemia: Secondary | ICD-10-CM | POA: Diagnosis not present

## 2024-04-21 DIAGNOSIS — Z7984 Long term (current) use of oral hypoglycemic drugs: Secondary | ICD-10-CM | POA: Diagnosis not present

## 2024-04-21 DIAGNOSIS — E1122 Type 2 diabetes mellitus with diabetic chronic kidney disease: Secondary | ICD-10-CM

## 2024-04-21 DIAGNOSIS — N1832 Chronic kidney disease, stage 3b: Secondary | ICD-10-CM | POA: Diagnosis not present

## 2024-04-21 DIAGNOSIS — I10 Essential (primary) hypertension: Secondary | ICD-10-CM

## 2024-04-21 DIAGNOSIS — N1831 Chronic kidney disease, stage 3a: Secondary | ICD-10-CM

## 2024-04-21 MED ORDER — LANCETS MISC: 3 refills | Status: AC

## 2024-04-21 MED ORDER — BLOOD GLUCOSE MONITORING SUPPL DEVI: 0 refills | Status: AC

## 2024-04-21 MED ORDER — LANCET DEVICE MISC: 1.0000 | 0 refills | Status: AC

## 2024-04-21 MED ORDER — BLOOD GLUCOSE TEST VI STRP: 1.0000 | ORAL_STRIP | 3 refills | Status: AC

## 2024-04-21 NOTE — Patient Instructions (Signed)
 Follow up in 6 months.  Call/message with concerns.  Take care  Dr. Bluford

## 2024-04-22 DIAGNOSIS — N1832 Chronic kidney disease, stage 3b: Secondary | ICD-10-CM | POA: Insufficient documentation

## 2024-04-22 DIAGNOSIS — Z510 Encounter for antineoplastic radiation therapy: Secondary | ICD-10-CM | POA: Diagnosis not present

## 2024-04-22 DIAGNOSIS — C9000 Multiple myeloma not having achieved remission: Secondary | ICD-10-CM | POA: Insufficient documentation

## 2024-04-22 DIAGNOSIS — C4921 Malignant neoplasm of connective and soft tissue of right lower limb, including hip: Secondary | ICD-10-CM | POA: Diagnosis not present

## 2024-04-22 NOTE — Assessment & Plan Note (Signed)
 Stable

## 2024-04-22 NOTE — Assessment & Plan Note (Signed)
 Lipid panel to assess.  Continue statin.  Last LDL was not at goal.

## 2024-04-22 NOTE — Assessment & Plan Note (Signed)
 Stable.  Last A1c 6.4.  A1c today.  Continue glimepiride .  Sending in glucometer, strips, lancets,

## 2024-04-22 NOTE — Assessment & Plan Note (Signed)
Fair control. Continue current medications. 

## 2024-04-22 NOTE — Progress Notes (Signed)
 "  Subjective:  Patient ID: Corey Murphy, male    DOB: 1950/06/06  Age: 73 y.o. MRN: [id number]  CC:   Chief Complaint  Patient presents with   Follow-up    6 MO F/U concerns about  B/p seeing pressures at home around 145/95    HPI:  73 year old male presents for follow up.  HTN fairly well-controlled on carvedilol , Lasix , spironolactone .  He is now on Eliquis .  He has been followed closely by oncology and radiation oncology for Dedifferentiated liposarcoma.  Also has smoldering myeloma.  Patient states that he needs a different glucometer due to his insurance.  Will send in.  Needs labs to reassess renal function, A1c, lipids.  Patient Active Problem List   Diagnosis Date Noted   Chronic kidney disease, stage 3b (HCC) 04/22/2024   Insomnia 02/11/2024   Liposarcoma (HCC) 02/11/2024   Heart failure with recovered ejection fraction (HFrecEF) (HCC) 10/25/2023   History of pulmonary embolism 10/25/2023   Type 2 diabetes mellitus with diabetic chronic kidney disease (HCC) 10/25/2023   Mixed hyperlipidemia 11/26/2022   Smoldering myeloma 08/03/2015   CKD (chronic kidney disease) stage 3, GFR 30-59 ml/min (HCC) 06/06/2013   Essential hypertension, benign     Social Hx   Social History   Socioeconomic History   Marital status: Married    Spouse name: Not on file   Number of children: 2   Years of education: Not on file   Highest education level: Master's degree (e.g., MA, MS, MEng, MEd, MSW, MBA)  Occupational History   Occupation: Retired, set designer  Tobacco Use   Smoking status: Former    Current packs/day: 0.00    Average packs/day: 1 pack/day for 12.0 years (12.0 ttl pk-yrs)    Types: Cigarettes    Start date: 05/28/1996    Quit date: 05/28/2008    Years since quitting: 15.9    Passive exposure: Past   Smokeless tobacco: Never  Vaping Use   Vaping status: Never Used  Substance and Sexual Activity   Alcohol use: Yes    Comment: Occasionally 2 to 3 times a  month   Drug use: Not Currently    Types: Marijuana    Comment: Smoked via a bowl occassionally.    Sexual activity: Not on file  Other Topics Concern   Not on file  Social History Narrative   Not on file   Social Drivers of Health   Tobacco Use: Medium Risk (04/21/2024)   Patient History    Smoking Tobacco Use: Former    Smokeless Tobacco Use: Never    Passive Exposure: Past  Physicist, Medical Strain: Low Risk (02/11/2024)   Overall Financial Resource Strain (CARDIA)    Difficulty of Paying Living Expenses: Not hard at all  Food Insecurity: No Food Insecurity (02/11/2024)   Epic    Worried About Programme Researcher, Broadcasting/film/video in the Last Year: Never true    Ran Out of Food in the Last Year: Never true  Transportation Needs: No Transportation Needs (02/11/2024)   Epic    Lack of Transportation (Medical): No    Lack of Transportation (Non-Medical): No  Physical Activity: Sufficiently Active (02/11/2024)   Exercise Vital Sign    Days of Exercise per Week: 5 days    Minutes of Exercise per Session: 60 min  Stress: Stress Concern Present (02/11/2024)   [employer] of Occupational Health - Occupational Stress Questionnaire    Feeling of Stress: To some extent  Social Connections: Moderately Integrated (02/11/2024)  Social Advertising Account Executive    Frequency of Communication with Friends and Family: Once a week    Frequency of Social Gatherings with Friends and Family: Once a week    Attends Religious Services: More than 4 times per year    Active Member of Clubs or Organizations: Yes    Attends Banker Meetings: 1 to 4 times per year    Marital Status: Married  Depression (PHQ2-9): Low Risk (02/11/2024)   Depression (PHQ2-9)    PHQ-2 Score: 0  Alcohol Screen: Low Risk (02/11/2024)   Alcohol Screen    Last Alcohol Screening Score (AUDIT): 3  Housing: Low Risk (02/11/2024)   Epic    Unable to Pay for Housing in the Last Year: No    Number of Times Moved  in the Last Year: 0    Homeless in the Last Year: No  Utilities: Low Risk (01/11/2024)   Received from [facility]   Utilities    In the past 12 months has the electric, gas, oil, or water  company threatened to shut off services in your home? : No  Health Literacy: Not on file    Review of Systems Per HPI  Objective:  BP (!) 142/87   Pulse 70   Temp 98.1 F (36.7 C)   Ht 5' 8 (1.727 m)   Wt 183 lb 6.4 oz (83.2 kg)   SpO2 97%   BMI 27.89 kg/m      04/21/2024    2:12 PM 04/21/2024    1:33 PM 02/20/2024    4:01 PM  BP/Weight  Systolic BP 142 158 168  Diastolic BP 87 96 89  Wt. (Lbs)  183.4   BMI  27.89 kg/m2     Physical Exam Vitals and nursing note reviewed.  Constitutional:      General: He is not in acute distress.    Appearance: Normal appearance.  HENT:     Head: Normocephalic and atraumatic.  Eyes:     General:        Right eye: No discharge.        Left eye: No discharge.     Conjunctiva/sclera: Conjunctivae normal.  Cardiovascular:     Rate and Rhythm: Normal rate and regular rhythm.  Pulmonary:     Effort: Pulmonary effort is normal.     Breath sounds: Normal breath sounds. No wheezing, rhonchi or rales.  Neurological:     Mental Status: He is alert.  Psychiatric:        Mood and Affect: Mood normal.        Behavior: Behavior normal.     Lab Results  Component Value Date   WBC 5.3 02/20/2024   HGB 12.3 (L) 02/20/2024   HCT 37.4 (L) 02/20/2024   PLT 255 02/20/2024   GLUCOSE 117 (H) 02/20/2024   CHOL 169 10/25/2023   TRIG 82 10/25/2023   HDL 67 10/25/2023   LDLCALC 87 10/25/2023   ALT 32 02/20/2024   AST 18 02/20/2024   NA 139 02/20/2024   K 3.6 02/20/2024   CL 104 02/20/2024   CREATININE 1.27 (H) 02/20/2024   BUN 15 02/20/2024   CO2 27 02/20/2024   TSH 1.426 03/17/2013   INR 1.7 (H) 02/28/2024   HGBA1C 6.4 (H) 10/25/2023     Assessment & Plan:  Type 2 diabetes mellitus with stage 3a chronic kidney disease, without long-term  current use of insulin  (HCC) Assessment & Plan: Stable.  Last A1c 6.4.  A1c today.  Continue glimepiride .  Sending in glucometer, strips, lancets,  Orders: -     Blood Glucose Monitoring Suppl; Dispense based on patient and insurance preference. Use up to four times daily as directed.  Dispense: 1 each; Refill: 0 -     Blood Glucose Test; 1 each by Does not apply route as directed. Dispense based on patient and insurance preference. Use up to four times daily as directed.  Dispense: 100 strip; Refill: 3 -     Lancet Device; 1 each by Does not apply route as directed. Dispense based on patient and insurance preference. Use up to four times daily as directed.  Dispense: 1 each; Refill: 0 -     Lancets; Dispense based on patient and insurance preference. Use up to four times daily as directed.  Dispense: 100 each; Refill: 3 -     POCT glycosylated hemoglobin (Hb A1C) -     Microalbumin / creatinine urine ratio  Mixed hyperlipidemia Assessment & Plan: Lipid panel to assess.  Continue statin.  Last LDL was not at goal.  Orders: -     Lipid panel  Chronic kidney disease, stage 3b (HCC) Assessment & Plan: Stable.   Essential hypertension, benign Assessment & Plan: Fair control.  Continue current medications.     Follow-up: 6 months  Saxon Crosby Bluford DO [facility] "

## 2024-04-23 DIAGNOSIS — C4921 Malignant neoplasm of connective and soft tissue of right lower limb, including hip: Secondary | ICD-10-CM | POA: Diagnosis not present

## 2024-04-23 DIAGNOSIS — Z510 Encounter for antineoplastic radiation therapy: Secondary | ICD-10-CM | POA: Diagnosis not present

## 2024-04-28 ENCOUNTER — Ambulatory Visit: Admitting: Family Medicine

## 2024-05-08 ENCOUNTER — Ambulatory Visit: Payer: Self-pay | Admitting: Family Medicine

## 2024-05-08 ENCOUNTER — Inpatient Hospital Stay: Attending: Hematology

## 2024-05-08 DIAGNOSIS — N189 Chronic kidney disease, unspecified: Secondary | ICD-10-CM | POA: Insufficient documentation

## 2024-05-08 DIAGNOSIS — D472 Monoclonal gammopathy: Secondary | ICD-10-CM | POA: Diagnosis present

## 2024-05-08 DIAGNOSIS — Z86711 Personal history of pulmonary embolism: Secondary | ICD-10-CM | POA: Diagnosis not present

## 2024-05-08 DIAGNOSIS — C4921 Malignant neoplasm of connective and soft tissue of right lower limb, including hip: Secondary | ICD-10-CM | POA: Insufficient documentation

## 2024-05-08 DIAGNOSIS — Z923 Personal history of irradiation: Secondary | ICD-10-CM | POA: Diagnosis not present

## 2024-05-08 DIAGNOSIS — Z7901 Long term (current) use of anticoagulants: Secondary | ICD-10-CM | POA: Diagnosis not present

## 2024-05-08 LAB — CBC WITH DIFFERENTIAL/PLATELET
Abs Immature Granulocytes: 0.01 K/uL (ref 0.00–0.07)
Basophils Absolute: 0 K/uL (ref 0.0–0.1)
Basophils Relative: 1 %
Eosinophils Absolute: 0.1 K/uL (ref 0.0–0.5)
Eosinophils Relative: 1 %
HCT: 45.4 % (ref 39.0–52.0)
Hemoglobin: 14.9 g/dL (ref 13.0–17.0)
Immature Granulocytes: 0 %
Lymphocytes Relative: 41 %
Lymphs Abs: 2 K/uL (ref 0.7–4.0)
MCH: 30.5 pg (ref 26.0–34.0)
MCHC: 32.8 g/dL (ref 30.0–36.0)
MCV: 93 fL (ref 80.0–100.0)
Monocytes Absolute: 0.6 K/uL (ref 0.1–1.0)
Monocytes Relative: 12 %
Neutro Abs: 2.2 K/uL (ref 1.7–7.7)
Neutrophils Relative %: 45 %
Platelets: 276 K/uL (ref 150–400)
RBC: 4.88 MIL/uL (ref 4.22–5.81)
RDW: 13.7 % (ref 11.5–15.5)
WBC: 5 K/uL (ref 4.0–10.5)
nRBC: 0 % (ref 0.0–0.2)

## 2024-05-08 LAB — LIPID PANEL
Chol/HDL Ratio: 2.6 ratio (ref 0.0–5.0)
Cholesterol, Total: 161 mg/dL (ref 100–199)
HDL: 62 mg/dL
LDL Chol Calc (NIH): 84 mg/dL (ref 0–99)
Triglycerides: 81 mg/dL (ref 0–149)
VLDL Cholesterol Cal: 15 mg/dL (ref 5–40)

## 2024-05-08 LAB — COMPREHENSIVE METABOLIC PANEL WITH GFR
ALT: 33 U/L (ref 0–44)
AST: 22 U/L (ref 15–41)
Albumin: 4.3 g/dL (ref 3.5–5.0)
Alkaline Phosphatase: 99 U/L (ref 38–126)
Anion gap: 5 (ref 5–15)
BUN: 19 mg/dL (ref 8–23)
CO2: 32 mmol/L (ref 22–32)
Calcium: 9.1 mg/dL (ref 8.9–10.3)
Chloride: 104 mmol/L (ref 98–111)
Creatinine, Ser: 1.26 mg/dL — ABNORMAL HIGH (ref 0.61–1.24)
GFR, Estimated: >60 mL/min
Glucose, Bld: 116 mg/dL — ABNORMAL HIGH (ref 70–99)
Potassium: 3.7 mmol/L (ref 3.5–5.1)
Sodium: 140 mmol/L (ref 135–145)
Total Bilirubin: 0.3 mg/dL (ref 0.0–1.2)
Total Protein: 8.4 g/dL — ABNORMAL HIGH (ref 6.5–8.1)

## 2024-05-08 LAB — MICROALBUMIN / CREATININE URINE RATIO
Creatinine, Urine: 249.1 mg/dL
Microalb/Creat Ratio: 49 mg/g{creat} — ABNORMAL HIGH (ref 0–29)
Microalbumin, Urine: 122.7 ug/mL

## 2024-05-08 LAB — PROTIME-INR
INR: 1 (ref 0.9–1.2)
Prothrombin Time: 10.5 s (ref 9.1–12.0)

## 2024-05-09 LAB — KAPPA/LAMBDA LIGHT CHAINS
Kappa free light chain: 19.5 mg/L — ABNORMAL HIGH (ref 3.3–19.4)
Kappa, lambda light chain ratio: 0.24 — ABNORMAL LOW (ref 0.26–1.65)
Lambda free light chains: 82.6 mg/L — ABNORMAL HIGH (ref 5.7–26.3)

## 2024-05-11 NOTE — Progress Notes (Unsigned)
 " Cardiology Office Note:  .   Date:  04/04/2024  ID:  Corey Murphy, DOB 1951-01-02, MRN [id number] PCP: Bluford Jacqulyn MATSU, DO  [facility] Providers Cardiologist:  Jayson Sierras, MD {   History of Present Illness: .   Corey Murphy is a 74 y.o. male  with PMHx of chronic HFimpEF, HTN, HLD, Type 2 DM, history of PE, Stage III CKD, nocturnal pauses and recently diagnosed with Dedifferentiated liposarcoma and smoldering myeloma (followed by oncology and radiation) who reports to [facility] office for 60-month follow up.   Chronic HFimpEF  EF 15% in 2014 with cardiac catheterization showing normal coronary arteries, EF normalized by repeat imaging Echo in 10/2022 showed a normal EF of 55 to 60%.  HTN Discontinued lisinopril  due to hypotension during hospitalization in 10/2022 History of PE Extensive bilateral acute pulmonary emboli diagnosed in July 2024  Previously on Coumadin  managed by [facility] but switched to Eliquis .  Nocturnal pauses  Zio 11/2022: 8-second run of complete heart block which occurred during the nocturnal hours.  Sleep Study 03/2023: Negative for sleep apnea   Last seen in heartcare 10/16/2023 by Dr. Sierras for follow up.  Doing well from cardiology perspective without any cardiac complaints.  Continued on Coreg  3.125 mg twice daily, Lasix  20 mg as needed for edema, spironolactone  25 mg daily, simvastatin  20 mg daily, and Coumadin  per [place].  Today, he is accompanied by his wife. Reports overall doing well from a cardiology perspective, though notes it has been a challenging period emotionally since his cancer diagnosis. He denies weight loss or fatigue. He reports occasional lightheadedness that he associates with low blood sugar, typically resolving with a sugary snack. Home blood pressures are usually around the 130s/80s, though he reports higher readings in medical settings. He denies chest pain, shortness of breath, palpitations, syncope, presyncope, orthopnea, PND,  edema, significant weight changes, bleeding concerns, or claudication. He reports good medication compliance.  He reports that warfarin was switched to Eliquis  after approval for financial assistance. He is open to starting Jardiance  but anticipates needing financial assistance due to cost. Diet includes frequent processed snacks. He remains physically active and exercises on the treadmill and stationary bike for about 30 minutes, 3-4 times per week.    ROS: 10 point review of system has been reviewed and considered negative except ones been listed in the HPI.    Studies Reviewed: .     Echocardiogram: 10/2022 IMPRESSIONS   1. Left ventricular ejection fraction, by estimation, is 55 to 60%. The  left ventricle has normal function. The left ventricle has no regional  wall motion abnormalities. There is mild asymmetric left ventricular  hypertrophy of the infero-lateral  segment. Indeterminate diastolic filling due to E-A fusion.   2. Right ventricular systolic function is mildly reduced. The right  ventricular size is mildly enlarged. Tricuspid regurgitation signal is  inadequate for assessing PA pressure.   3. Right atrial size was severely dilated.   4. The mitral valve is normal in structure. Trivial mitral valve  regurgitation.   5. The aortic valve is tricuspid. Aortic valve regurgitation is not  visualized.   6. The inferior vena cava is normal in size with greater than 50%  respiratory variability, suggesting right atrial pressure of 3 mmHg.   Comparison(s): Prior images reviewed side by side.      Event Monitor: 11/2022 ZIO monitor reviewed.  13 days, 21 hours analyzed.   Predominant rhythm is sinus with heart rate ranging from 49 bpm up  to 145 bpm with average heart rate 76 bpm. There were rare PACs representing less than 1% total beats. There were rare PVCs including ventricular couplets and triplets representing less than 1% total beats.  Also limited episodes of  ventricular bigeminy and trigeminy as well as 3 episodes of NSVT, the longest of which was 11 beats.  There were no sustained ventricular events. There was an 8-second episode of complete heart block noted at 4:28 AM on August 12 with narrow complex escape at 36 bpm.  Prolonged PR interval noted at baseline and there were limited brief episodes of second-degree type I Wenckebach conduction as well. No pauses.  Physical Exam:    Today's Vitals   05/14/24 1337 05/14/24 1409  BP: (!) 152/84 (!) 150/88  Pulse: 69   SpO2: 97%   Weight: 182 lb 12.8 oz (82.9 kg)   Height: 5' 7 (1.702 m)    Body mass index is 28.63 kg/m.      Wt Readings from Last 3 Encounters:  02/20/24 186 lb (84.4 kg)  02/11/24 186 lb (84.4 kg)  12/11/23 176 lb (79.8 kg)    GEN: Well nourished, well developed in no acute distress while sitting in chair.  NECK: No JVD; No carotid bruits CARDIAC: RRR, no murmurs, rubs, gallops RESPIRATORY:  Clear to auscultation without rales, wheezing or rhonchi  ABDOMEN: Soft, non-tender, non-distended EXTREMITIES:  No edema; No deformity    ASSESSMENT AND PLAN: .    Chronic HFimpEF History of severe nonischemic cardiomyopathy with recovered EF; currently asymptomatic and euvolemic. Start empagliflozin  (Jardiance ) 10 mg daily; patient may require financial assistance.  Continue Coreg  3.125 mg BID, spironolactone  25 mg daily, and Lasix  20 mg PRN for edema. If symptoms or volume status worsen: consider diuretic adjustment and repeat echocardiogram. Encouraged low sodium diet, fluid restriction <2L, and daily weights.  Educated to contact our office for weight gain of 2 lbs overnight or 5 lbs in one week. ED precautions discussed.   Hypertension Home BP generally controlled with likely white-coat component; ACE inhibitor previously discontinued due to hypotension. Continue GDMT regimen as above. Continue home BP monitoring; advised to contact office if BP persistently  >140/90. Per Dr. Debera, if additional antihypertensive indicated in the future then consider addition of low-dose losartan.   Hyperlipidemia, LDL goal < 70 LDL 86 (05/2023), above optimal goal given comorbidities. Discontinue simvastatin ; start rosuvastatin  (Crestor ) 20 mg daily. Order CMP and fasting lipid panel in 8 weeks. If statin not tolerated or LDL remains elevated: consider alternative lipid-lowering therapy.  Type 2 Diabetes Mellitus Reports intermittent hypoglycemia symptoms. Coordinate with PCP/endocrinology for diabetes management.  History of Pulmonary Embolism Extensive bilateral PE diagnosed 10/2022. Previously on Coumadin  managed by [facility] but switched to Eliquis .  Continue apixaban  (Eliquis ) 5 mg BID  Nocturnal Pauses / Conduction Disease Prior nocturnal complete heart block episode; asymptomatic. Coreg  has been reduced to 3.125 mg twice daily, tolerating well with heart rate of 69 today  Chronic Kidney Disease - Stage III Renal function stable at baseline.  Oncology (Liposarcoma / Smoldering Myeloma) Managed by oncology and radiation. Continue coordination with oncology team.     Dispo: Follow up in 3 months with SM or Scottie   Signed, Rosa Wyly Y Daxx Tiggs, PA-C  "

## 2024-05-12 LAB — PROTEIN ELECTROPHORESIS, SERUM
A/G Ratio: 0.9 (ref 0.7–1.7)
Albumin ELP: 3.8 g/dL (ref 2.9–4.4)
Alpha-1-Globulin: 0.2 g/dL (ref 0.0–0.4)
Alpha-2-Globulin: 0.7 g/dL (ref 0.4–1.0)
Beta Globulin: 1.1 g/dL (ref 0.7–1.3)
Gamma Globulin: 2.3 g/dL — ABNORMAL HIGH (ref 0.4–1.8)
Globulin, Total: 4.3 g/dL — ABNORMAL HIGH (ref 2.2–3.9)
M-Spike, %: 1.9 g/dL — ABNORMAL HIGH
Total Protein ELP: 8.1 g/dL (ref 6.0–8.5)

## 2024-05-14 ENCOUNTER — Ambulatory Visit: Attending: Physician Assistant | Admitting: Physician Assistant

## 2024-05-14 ENCOUNTER — Encounter: Payer: Self-pay | Admitting: Physician Assistant

## 2024-05-14 VITALS — BP 150/88 | HR 69 | Ht 67.0 in | Wt 182.8 lb

## 2024-05-14 DIAGNOSIS — C499 Malignant neoplasm of connective and soft tissue, unspecified: Secondary | ICD-10-CM | POA: Diagnosis not present

## 2024-05-14 DIAGNOSIS — I455 Other specified heart block: Secondary | ICD-10-CM

## 2024-05-14 DIAGNOSIS — D472 Monoclonal gammopathy: Secondary | ICD-10-CM | POA: Diagnosis not present

## 2024-05-14 DIAGNOSIS — I5020 Unspecified systolic (congestive) heart failure: Secondary | ICD-10-CM

## 2024-05-14 DIAGNOSIS — I10 Essential (primary) hypertension: Secondary | ICD-10-CM | POA: Diagnosis not present

## 2024-05-14 DIAGNOSIS — N1831 Chronic kidney disease, stage 3a: Secondary | ICD-10-CM | POA: Diagnosis not present

## 2024-05-14 DIAGNOSIS — Z86711 Personal history of pulmonary embolism: Secondary | ICD-10-CM

## 2024-05-14 DIAGNOSIS — E782 Mixed hyperlipidemia: Secondary | ICD-10-CM

## 2024-05-14 DIAGNOSIS — N183 Chronic kidney disease, stage 3 unspecified: Secondary | ICD-10-CM | POA: Diagnosis not present

## 2024-05-14 DIAGNOSIS — E785 Hyperlipidemia, unspecified: Secondary | ICD-10-CM

## 2024-05-14 DIAGNOSIS — E1122 Type 2 diabetes mellitus with diabetic chronic kidney disease: Secondary | ICD-10-CM | POA: Diagnosis not present

## 2024-05-14 DIAGNOSIS — I459 Conduction disorder, unspecified: Secondary | ICD-10-CM | POA: Diagnosis not present

## 2024-05-14 MED ORDER — ROSUVASTATIN CALCIUM 20 MG PO TABS: 20.0000 mg | ORAL_TABLET | Freq: Every day | ORAL | 3 refills | Status: AC

## 2024-05-14 MED ORDER — EMPAGLIFLOZIN 10 MG PO TABS: 10.0000 mg | ORAL_TABLET | Freq: Every day | ORAL | 3 refills | Status: AC

## 2024-05-14 NOTE — Patient Instructions (Addendum)
 Medication Instructions:  Your physician has recommended you make the following change in your medication:   -Stop Simvastatin  -Start Rosuvastatin  (Crestor ) 20 mg once daily  -Start Jardiance  10 mg once daily   *If you need a refill on your cardiac medications before your next appointment, please call your pharmacy*  Lab Work: In 8 weeks:  -Fasting Lipid Panle -CMP  If you have labs (blood work) drawn today and your tests are completely normal, you will receive your results only by: MyChart Message (if you have MyChart) OR A paper copy in the mail If you have any lab test that is abnormal or we need to change your treatment, we will call you to review the results.  Testing/Procedures: None  Follow-Up: At [facility], you and your health needs are our priority.  As part of our continuing mission to provide you with exceptional heart care, our providers are all part of one team.  This team includes your primary Cardiologist (physician) and Advanced Practice Providers or APPs (Physician Assistants and Nurse Practitioners) who all work together to provide you with the care you need, when you need it.  Your next appointment:   3 month(s)  Provider:   You may see Jayson Sierras, MD or one of the following Advanced Practice Providers on your designated Care Team:   Laymon Qua, PA-C  [place], [place] Olivia Pavy, [place]     We recommend signing up for the patient portal called MyChart.  Sign up information is provided on this After Visit Summary.  MyChart is used to connect with patients for Virtual Visits (Telemedicine).  Patients are able to view lab/test results, encounter notes, upcoming appointments, etc.  Non-urgent messages can be sent to your provider as well.   To learn more about what you can do with MyChart, go to [email or web address].   Other Instructions Thank you for choosing [facility]!

## 2024-05-15 ENCOUNTER — Encounter: Payer: Self-pay | Admitting: Physician Assistant

## 2024-05-15 ENCOUNTER — Telehealth: Payer: Self-pay

## 2024-05-15 ENCOUNTER — Inpatient Hospital Stay: Admitting: Oncology

## 2024-05-15 VITALS — BP 178/102 | HR 66 | Temp 98.0°F | Resp 16 | Wt 183.0 lb

## 2024-05-15 DIAGNOSIS — D472 Monoclonal gammopathy: Secondary | ICD-10-CM

## 2024-05-15 DIAGNOSIS — N189 Chronic kidney disease, unspecified: Secondary | ICD-10-CM

## 2024-05-15 NOTE — Assessment & Plan Note (Addendum)
-  We discussed most recent labs which are more or less stable.  Creatinine 1.26, calcium  9.1, hemoglobin 14.9 and no new bone pain. -Myeloma labs are stable.  M spike slight increase to 1.9 but ratio normal. - Skeletal survey from 10/30/2023: Negative for lytic lesions. - No crab features at this time to warrant therapy.  Will continue monitoring in 6 months with repeat myeloma labs.   - He is found to have right posterior thigh mass and is planning on undergoing resection as differential includes peripheral nerve sheath tumor or sarcoma. -Ultimately, he was sent to [facility] who determined that radiation was his best course of action.  He did have multiple surgeries to have tumor removed with negative margins.  He thinks he has 9 more radiation treatments.  Overall he is doing well with these.

## 2024-05-15 NOTE — Progress Notes (Signed)
 "  [facility] OFFICE PROGRESS NOTE  Murphy, Corey G, DO  ASSESSMENT & PLAN:    Assessment & Plan Smoldering myeloma -We discussed most recent labs which are more or less stable.  Creatinine 1.26, calcium  9.1, hemoglobin 14.9 and no new bone pain. -Myeloma labs are stable.  M spike slight increase to 1.9 but ratio normal. - Skeletal survey from 10/30/2023: Negative for lytic lesions. - No crab features at this time to warrant therapy.  Will continue monitoring in 6 months with repeat myeloma labs.   - He is found to have right posterior thigh mass and is planning on undergoing resection as differential includes peripheral nerve sheath tumor or sarcoma. -Ultimately, he was sent to [facility] who determined that radiation was his best course of action.  He did have multiple surgeries to have tumor removed with negative margins.  He thinks he has 9 more radiation treatments.  Overall he is doing well with these. Chronic kidney disease, unspecified CKD stage - Baseline creatinine between 1.3-1.5 is stable.  Latest creatinine is 1.26.   Orders Placed This Encounter  Procedures   Protein electrophoresis, serum    Standing Status:   Future    Expected Date:   11/07/2024    Expiration Date:   02/09/2025   Kappa/lambda light chains    Standing Status:   Future    Expected Date:   11/07/2024    Expiration Date:   02/09/2025   Comprehensive metabolic panel    Standing Status:   Future    Expected Date:   11/07/2024    Expiration Date:   02/09/2025   CBC with Differential    Standing Status:   Future    Expected Date:   11/07/2024    Expiration Date:   02/09/2025    INTERVAL HISTORY: Patient returns for follow-up for smoldering myeloma.  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  He is currently undergoing radiation treatment.  Reports they offered chemotherapy but given EF is  around 15%, they have opted for radiation alone with short course follow-up with imaging every quarter.  Overall, patient is doing well with this.  Patient was seen in the emergency room on 02/20/2024 for chest pain and right leg swelling after surgery.  He was worried for a blood clot but was ultimately negative.  He is also on Coumadin .  He presents today with his wife.  Appetite and energy levels are 100%.  He has dizziness at times.  His blood pressure has been elevated mainly at his medical visits but okay at home.  This morning he checked his blood pressure and it was 130/70.  He denies any new concerns such as unintentional weight loss, appetite changes, fevers or night sweats.  We reviewed kappa lambda light chains, CMP, CBC and protein electrophoresis.  SUMMARY OF HEMATOLOGIC HISTORY: Oncology History   No problem history exists.    1.  IgG lambda smoldering multiple myeloma: -BM BX on 07/08/2015 shows normocellular marrow with monoclonal plasmacytosis, 10-20%, 46, XY, myeloma FISH panel negative -Skeletal survey on 06/04/2019 was negative for lytic lesions.   2.  Unprovoked pulmonary embolism: - He had submassive PE on 11/25/2022.  He is on Coumadin  which she will continue indefinitely.   CBC    Component Value Date/Time   WBC 5.0 05/08/2024 1336   RBC 4.88 05/08/2024 1336   HGB 14.9 05/08/2024 1336  HCT 45.4 05/08/2024 1336   PLT 276 05/08/2024 1336   MCV 93.0 05/08/2024 1336   MCH 30.5 05/08/2024 1336   MCHC 32.8 05/08/2024 1336   RDW 13.7 05/08/2024 1336   LYMPHSABS 2.0 05/08/2024 1336   MONOABS 0.6 05/08/2024 1336   EOSABS 0.1 05/08/2024 1336   BASOSABS 0.0 05/08/2024 1336       Latest Ref Rng & Units 05/08/2024    1:36 PM 02/20/2024   12:47 PM 10/30/2023    8:36 AM  CMP  Glucose 70 - 99 mg/dL 883  882  879   BUN 8 - 23 mg/dL 19  15  16    Creatinine 0.61 - 1.24 mg/dL 8.73  8.72  8.66   Sodium 135 - 145 mmol/L 140  139  136   Potassium 3.5 - 5.1 mmol/L 3.7  3.6   3.6   Chloride 98 - 111 mmol/L 104  104  102   CO2 22 - 32 mmol/L 32  27  26   Calcium  8.9 - 10.3 mg/dL 9.1  9.1  8.7   Total Protein 6.5 - 8.1 g/dL 8.4  8.0  8.0   Total Bilirubin 0.0 - 1.2 mg/dL 0.3  0.3  0.6   Alkaline Phos 38 - 126 U/L 99  93  74   AST 15 - 41 U/L 22  18  20    ALT 0 - 44 U/L 33  32  28      Lab Results  Component Value Date   FERRITIN 79 07/05/2015   VITAMINB12 458 07/05/2015    Vitals:   05/15/24 1358  BP: (!) 178/102  Pulse: 66  Resp: 16  Temp: 98 F (36.7 C)  SpO2: 97%    Review of System:  Review of Systems  Neurological:  Positive for dizziness.    Physical Exam: Physical Exam Constitutional:      Appearance: Normal appearance.  HENT:     Head: Normocephalic and atraumatic.  Eyes:     Pupils: Pupils are equal, round, and reactive to light.  Cardiovascular:     Rate and Rhythm: Normal rate and regular rhythm.     Heart sounds: Normal heart sounds. No murmur heard. Pulmonary:     Effort: Pulmonary effort is normal.     Breath sounds: Normal breath sounds. No wheezing.  Abdominal:     General: Bowel sounds are normal. There is no distension.     Palpations: Abdomen is soft.     Tenderness: There is no abdominal tenderness.  Musculoskeletal:        General: Normal range of motion.     Cervical back: Normal range of motion.  Skin:    General: Skin is warm and dry.     Findings: No rash.  Neurological:     Mental Status: He is alert and oriented to person, place, and time.     Gait: Gait is intact.  Psychiatric:        Mood and Affect: Mood and affect normal.        Cognition and Memory: Memory normal.        Judgment: Judgment normal.      I spent 25 minutes dedicated to the care of this patient (face-to-face and non-face-to-face) on the date of the encounter to include what is described in the assessment and plan.,  Delon Hope, NP 05/15/2024 2:50 PM "

## 2024-05-15 NOTE — Telephone Encounter (Signed)
 Patient prescribed Jardiance  10 mg tablets by provider on 1/14. Pt came into the office this afternoon and stated that Jardiance  is too expensive. Pt stated medication would cost about $200/month. Advised pt I would send a message to the RX Med Assist team to see if there are any options to help patient pay for medication.   Patient had no further questions or concerns

## 2024-05-15 NOTE — Assessment & Plan Note (Addendum)
-   Baseline creatinine between 1.3-1.5 is stable.  Latest creatinine is 1.26.

## 2024-05-16 ENCOUNTER — Other Ambulatory Visit (HOSPITAL_COMMUNITY): Payer: Self-pay

## 2024-05-16 ENCOUNTER — Telehealth: Payer: Self-pay | Admitting: Pharmacy Technician

## 2024-05-16 NOTE — Telephone Encounter (Signed)
 Patient Advocate Encounter   The patient was approved for a [employer] grant that will help cover the cost of Jardiance  Total amount awarded, 7500.00.  Effective: 04/16/24 - 04/15/25   APW:[id number] ERW:[id number] Group:[id number] PI:[id number]  [employer] ID: [id number]   Pharmacy provided with approval and processing information. Patient informed via mychart

## 2024-05-19 ENCOUNTER — Encounter (INDEPENDENT_AMBULATORY_CARE_PROVIDER_SITE_OTHER): Admitting: Ophthalmology

## 2024-05-19 DIAGNOSIS — E113291 Type 2 diabetes mellitus with mild nonproliferative diabetic retinopathy without macular edema, right eye: Secondary | ICD-10-CM | POA: Diagnosis not present

## 2024-05-19 DIAGNOSIS — H43813 Vitreous degeneration, bilateral: Secondary | ICD-10-CM | POA: Diagnosis not present

## 2024-05-19 DIAGNOSIS — I10 Essential (primary) hypertension: Secondary | ICD-10-CM

## 2024-05-19 DIAGNOSIS — Z7984 Long term (current) use of oral hypoglycemic drugs: Secondary | ICD-10-CM

## 2024-05-19 DIAGNOSIS — H35033 Hypertensive retinopathy, bilateral: Secondary | ICD-10-CM | POA: Diagnosis not present

## 2024-05-19 DIAGNOSIS — H35373 Puckering of macula, bilateral: Secondary | ICD-10-CM

## 2024-05-22 ENCOUNTER — Ambulatory Visit: Admitting: Family Medicine

## 2024-05-22 VITALS — BP 145/84 | HR 97 | Temp 98.2°F | Ht 67.0 in | Wt 180.4 lb

## 2024-05-22 DIAGNOSIS — G8929 Other chronic pain: Secondary | ICD-10-CM

## 2024-05-22 DIAGNOSIS — M545 Low back pain, unspecified: Secondary | ICD-10-CM

## 2024-05-22 DIAGNOSIS — M26629 Arthralgia of temporomandibular joint, unspecified side: Secondary | ICD-10-CM | POA: Diagnosis not present

## 2024-05-22 MED ORDER — HYDROCODONE-ACETAMINOPHEN 10-325 MG PO TABS: 1.0000 | ORAL_TABLET | Freq: Two times a day (BID) | ORAL | 0 refills | Status: AC | PRN

## 2024-05-22 MED ORDER — TRIAMCINOLONE ACETONIDE 40 MG/ML IJ SUSP
40.0000 mg | Freq: Once | INTRAMUSCULAR | Status: AC
  Administered 2024-05-22: 40 mg via INTRAMUSCULAR

## 2024-05-22 NOTE — Patient Instructions (Signed)
 Medication as prescribed.  Take care  Dr. Adriana Simas

## 2024-05-26 DIAGNOSIS — G8929 Other chronic pain: Secondary | ICD-10-CM | POA: Insufficient documentation

## 2024-05-26 DIAGNOSIS — M26629 Arthralgia of temporomandibular joint, unspecified side: Secondary | ICD-10-CM | POA: Insufficient documentation

## 2024-05-26 NOTE — Progress Notes (Signed)
 "  Subjective:  Patient ID: Corey Murphy, male    DOB: December 15, 1950  Age: 74 y.o. MRN: [id number]  CC:   Chief Complaint  Patient presents with   Ear Pain    Right ear pain leading to jaw   Jaw Pain    Started 10 days ago after cold  Tmg joint  Pt can hear it cracking and it hurts when chewing food  Pt states when he drinks something cold he can feel it clog up Has taken tylenol  and ear drops to ease pain    HPI:  74 year old male presents for evaluation of the above.  Patient reports that he has recently had a cold. Report right ear discomfort and pain at the right TMJ. Worse with eating. Has used ear drops and taken tylenol  without improvement. No fever.   Additionally, patient experiencing low back pain. 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 Patient was previously on chronic opioids from former PCP. He is requesting a short term Rx for pain medication.  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   Chronic low back pain 05/26/2024   Insomnia 02/11/2024   Liposarcoma (HCC) 02/11/2024   Heart failure with recovered ejection fraction (HFrecEF) (HCC) 10/25/2023   History of pulmonary embolism 10/25/2023   Type 2 diabetes mellitus with diabetic chronic kidney disease (HCC) 10/25/2023   Mixed hyperlipidemia 11/26/2022   Smoldering myeloma 08/03/2015   CKD (chronic kidney disease) 06/06/2013   Essential hypertension, benign     Social Hx   Social History   Socioeconomic History   Marital status: Married    Spouse name: Not on file   Number of children: 2   Years of education: Not on file   Highest education level: Master's degree (e.g., MA, MS, MEng, MEd, MSW, MBA)  Occupational History   Occupation: Retired, set designer  Tobacco Use   Smoking status: Former    Current packs/day: 0.00    Average packs/day: 1 pack/day for 12.0 years (12.0 ttl pk-yrs)    Types: Cigarettes     Start date: 05/28/1996    Quit date: 05/28/2008    Years since quitting: 16.0    Passive exposure: Past   Smokeless tobacco: Never  Vaping Use   Vaping status: Never Used  Substance and Sexual Activity   Alcohol use: Yes    Comment: Occasionally 2 to 3 times a month   Drug use: Not Currently    Types: Marijuana    Comment: Smoked via a bowl occassionally.    Sexual activity: Not on file  Other Topics Concern   Not on file  Social History Narrative   Not on file   Social Drivers of Health   Tobacco Use: Medium Risk (05/15/2024)   Patient History    Smoking Tobacco Use: Former    Smokeless Tobacco Use: Never    Passive Exposure: Past  Physicist, Medical Strain: Low Risk (02/11/2024)   Overall Financial Resource Strain (CARDIA)    Difficulty of Paying Living Expenses: Not hard at all  Food Insecurity: No Food Insecurity (02/11/2024)   Epic    Worried About Programme Researcher, Broadcasting/film/video in the Last Year: Never true    Ran Out of Food in the Last Year: Never true  Transportation Needs: No Transportation Needs (02/11/2024)   Epic    Lack of Transportation (Medical): No    Lack of Transportation (Non-Medical): No  Physical Activity: Sufficiently  Active (02/11/2024)   Exercise Vital Sign    Days of Exercise per Week: 5 days    Minutes of Exercise per Session: 60 min  Stress: Stress Concern Present (02/11/2024)   [employer] of Occupational Health - Occupational Stress Questionnaire    Feeling of Stress: To some extent  Social Connections: Moderately Integrated (02/11/2024)   Social Connection and Isolation Panel    Frequency of Communication with Friends and Family: Once a week    Frequency of Social Gatherings with Friends and Family: Once a week    Attends Religious Services: More than 4 times per year    Active Member of Clubs or Organizations: Yes    Attends Banker Meetings: 1 to 4 times per year    Marital Status: Married  Depression (PHQ2-9): Low Risk  (05/22/2024)   Depression (PHQ2-9)    PHQ-2 Score: 0  Alcohol Screen: Low Risk (02/11/2024)   Alcohol Screen    Last Alcohol Screening Score (AUDIT): 3  Housing: Low Risk (02/11/2024)   Epic    Unable to Pay for Housing in the Last Year: No    Number of Times Moved in the Last Year: 0    Homeless in the Last Year: No  Utilities: Low Risk (01/11/2024)   Received from [facility]   Utilities    In the past 12 months has the electric, gas, oil, or water  company threatened to shut off services in your home? : No  Health Literacy: Not on file    Review of Systems Per HPI  Objective:  BP (!) 145/84   Pulse 97   Temp 98.2 F (36.8 C)   Ht 5' 7 (1.702 m)   Wt 180 lb 6.4 oz (81.8 kg)   SpO2 99%   BMI 28.25 kg/m      05/22/2024    2:17 PM 05/15/2024    1:58 PM 05/14/2024    2:09 PM  BP/Weight  Systolic BP 145 178 150  Diastolic BP 84 102 88  Wt. (Lbs) 180.4 182.98   BMI 28.25 kg/m2 28.66 kg/m2     Physical Exam Vitals and nursing note reviewed.  Constitutional:      General: He is not in acute distress.    Appearance: Normal appearance.  HENT:     Head: Normocephalic and atraumatic.     Comments: Tenderness over the right TMJ.    Right Ear: Tympanic membrane normal.     Left Ear: Tympanic membrane normal.  Cardiovascular:     Rate and Rhythm: Normal rate and regular rhythm.  Pulmonary:     Effort: Pulmonary effort is normal.     Breath sounds: Normal breath sounds.  Neurological:     Mental Status: He is alert.     Lab Results  Component Value Date   WBC 5.0 05/08/2024   HGB 14.9 05/08/2024   HCT 45.4 05/08/2024   PLT 276 05/08/2024   GLUCOSE 116 (H) 05/08/2024   CHOL 161 05/07/2024   TRIG 81 05/07/2024   HDL 62 05/07/2024   LDLCALC 84 05/07/2024   ALT 33 05/08/2024   AST 22 05/08/2024   NA 140 05/08/2024   K 3.7 05/08/2024   CL 104 05/08/2024   CREATININE 1.26 (H) 05/08/2024   BUN 19 05/08/2024   CO2 32 05/08/2024   TSH 1.426 03/17/2013   INR  1.0 05/07/2024   HGBA1C 6.4 (H) 10/25/2023     Assessment & Plan:  TMJ pain dysfunction syndrome Assessment & Plan:  Treating with corticosteroids. Patient elected for injection today.  Orders: -     Triamcinolone  Acetonide  Chronic low back pain, unspecified back pain laterality, unspecified whether sciatica present Assessment & Plan: Pain medication sent in.  Orders: -     HYDROcodone -Acetaminophen ; Take 1 tablet by mouth 2 (two) times daily as needed for severe pain (pain score 7-10).  Dispense: 10 tablet; Refill: 0    Follow-up:  As previously scheduled.  Jacqulyn Ahle DO [facility] "

## 2024-05-26 NOTE — Assessment & Plan Note (Signed)
Pain medication sent in

## 2024-05-26 NOTE — Assessment & Plan Note (Signed)
 Treating with corticosteroids. Patient elected for injection today.

## 2024-06-05 ENCOUNTER — Encounter: Payer: Self-pay | Admitting: *Deleted

## 2024-06-05 NOTE — Progress Notes (Signed)
 [place]                                          MRN: [id number]   06/05/2024   The [facility] Quality Team Specialist reviewed this patient medical record for the purposes of chart review for care gap closure. The following were reviewed: chart review for care gap closure-diabetic eye exam.    [facility]I Quality Team

## 2024-06-19 ENCOUNTER — Ambulatory Visit

## 2024-08-25 ENCOUNTER — Ambulatory Visit: Admitting: Cardiology

## 2024-10-20 ENCOUNTER — Ambulatory Visit: Admitting: Family Medicine

## 2024-11-12 ENCOUNTER — Inpatient Hospital Stay

## 2024-11-19 ENCOUNTER — Inpatient Hospital Stay: Admitting: Oncology

## 2025-05-21 ENCOUNTER — Encounter (INDEPENDENT_AMBULATORY_CARE_PROVIDER_SITE_OTHER): Admitting: Ophthalmology
